# Patient Record
Sex: Male | Born: 1960 | Race: Black or African American | Hispanic: No | Marital: Married | Smoking: Former smoker
Health system: Southern US, Community
[De-identification: ages and names within clinical notes are randomized; demographics above are authoritative.]

## PROBLEM LIST (undated history)

## (undated) DIAGNOSIS — N280 Ischemia and infarction of kidney: Secondary | ICD-10-CM

## (undated) DIAGNOSIS — J439 Emphysema, unspecified: Secondary | ICD-10-CM

## (undated) DIAGNOSIS — M1990 Unspecified osteoarthritis, unspecified site: Secondary | ICD-10-CM

## (undated) HISTORY — DX: Ischemia and infarction of kidney: N28.0

## (undated) HISTORY — DX: Unspecified osteoarthritis, unspecified site: M19.90

## (undated) HISTORY — DX: Emphysema, unspecified: J43.9

---

## 2002-01-05 ENCOUNTER — Encounter: Admission: RE | Admit: 2002-01-05 | Discharge: 2002-01-05 | Payer: Self-pay | Admitting: *Deleted

## 2008-06-29 ENCOUNTER — Emergency Department (HOSPITAL_COMMUNITY): Admission: EM | Admit: 2008-06-29 | Discharge: 2008-06-29 | Payer: Self-pay | Admitting: Emergency Medicine

## 2008-07-28 ENCOUNTER — Emergency Department (HOSPITAL_COMMUNITY): Admission: EM | Admit: 2008-07-28 | Discharge: 2008-07-28 | Payer: Self-pay | Admitting: Emergency Medicine

## 2013-01-12 ENCOUNTER — Ambulatory Visit (INDEPENDENT_AMBULATORY_CARE_PROVIDER_SITE_OTHER): Payer: BC Managed Care – PPO | Admitting: Family Medicine

## 2013-01-12 VITALS — BP 124/74 | HR 68 | Temp 98.0°F | Resp 18 | Ht 71.0 in | Wt 179.0 lb

## 2013-01-12 DIAGNOSIS — Z0000 Encounter for general adult medical examination without abnormal findings: Secondary | ICD-10-CM

## 2013-01-12 LAB — COMPREHENSIVE METABOLIC PANEL
ALT: 21 U/L (ref 0–53)
AST: 24 U/L (ref 0–37)
Albumin: 3.6 g/dL (ref 3.5–5.2)
Alkaline Phosphatase: 81 U/L (ref 39–117)
BUN: 13 mg/dL (ref 6–23)
CO2: 28 mEq/L (ref 19–32)
Calcium: 9.2 mg/dL (ref 8.4–10.5)
Chloride: 108 mEq/L (ref 96–112)
Creat: 1.15 mg/dL (ref 0.50–1.35)
Glucose, Bld: 81 mg/dL (ref 70–99)
Potassium: 4.7 mEq/L (ref 3.5–5.3)
Sodium: 142 mEq/L (ref 135–145)
Total Bilirubin: 0.4 mg/dL (ref 0.3–1.2)
Total Protein: 7.3 g/dL (ref 6.0–8.3)

## 2013-01-12 LAB — POCT CBC
Granulocyte percent: 58.8 %G (ref 37–80)
HCT, POC: 49.9 % (ref 43.5–53.7)
Hemoglobin: 16.3 g/dL (ref 14.1–18.1)
Lymph, poc: 2.8 (ref 0.6–3.4)
MCH, POC: 29.1 pg (ref 27–31.2)
MCHC: 32.7 g/dL (ref 31.8–35.4)
MCV: 88.9 fL (ref 80–97)
MID (cbc): 0.7 (ref 0–0.9)
MPV: 9.1 fL (ref 0–99.8)
POC Granulocyte: 4.9 (ref 2–6.9)
POC LYMPH PERCENT: 33.4 %L (ref 10–50)
POC MID %: 7.8 %M (ref 0–12)
Platelet Count, POC: 160 10*3/uL (ref 142–424)
RBC: 5.61 M/uL (ref 4.69–6.13)
RDW, POC: 16 %
WBC: 8.4 10*3/uL (ref 4.6–10.2)

## 2013-01-12 LAB — POCT URINALYSIS DIPSTICK
Bilirubin, UA: NEGATIVE
Glucose, UA: NEGATIVE
Ketones, UA: NEGATIVE
Nitrite, UA: NEGATIVE
Protein, UA: NEGATIVE
Spec Grav, UA: 1.015
Urobilinogen, UA: 0.2
pH, UA: 5.5

## 2013-01-12 LAB — POCT UA - MICROSCOPIC ONLY
Bacteria, U Microscopic: NEGATIVE
Casts, Ur, LPF, POC: NEGATIVE
Crystals, Ur, HPF, POC: NEGATIVE
Mucus, UA: NEGATIVE
Yeast, UA: NEGATIVE

## 2013-01-12 LAB — IFOBT (OCCULT BLOOD): IFOBT: NEGATIVE

## 2013-01-12 LAB — POCT GLYCOSYLATED HEMOGLOBIN (HGB A1C): Hemoglobin A1C: 5.7

## 2013-01-12 NOTE — Progress Notes (Signed)
[address]   [place], [place]  [address]   [phone]  Subjective:    Patient ID: Cody Joyce, male    DOB: Mar 25, 1961, 52 y.o.   MRN: [id number]  HPI This 52 y.o. male presents for CPE.  PCP:  None  Last physical 2008. Tetanus vaccine 2006. Influenza vaccine never. S/p Hepatitis B vaccine.   Not sure about Hepatitis A; previous Hepatitis A? Colonoscopy never. Eye exam no; no glasses or contacts.   Dental exam 2007.   Review of Systems  Constitutional: Negative for fever, chills, diaphoresis, activity change, appetite change, fatigue and unexpected weight change.  HENT: Negative for hearing loss, ear pain, nosebleeds, congestion, sore throat, facial swelling, rhinorrhea, sneezing, drooling, mouth sores, trouble swallowing, neck pain, neck stiffness, dental problem, voice change, postnasal drip, sinus pressure, tinnitus and ear discharge.   Eyes: Negative for photophobia, pain, discharge, redness, itching and visual disturbance.  Respiratory: Negative for apnea, cough, choking, chest tightness, shortness of breath, wheezing and stridor.   Cardiovascular: Negative for chest pain, palpitations and leg swelling.  Gastrointestinal: Negative for nausea, vomiting, abdominal pain, diarrhea, constipation, blood in stool, abdominal distention, anal bleeding and rectal pain.  Endocrine: Negative for cold intolerance, heat intolerance, polydipsia, polyphagia and polyuria.  Genitourinary: Negative for dysuria, urgency, frequency, hematuria, flank pain, decreased urine volume, discharge, penile swelling, scrotal swelling, difficulty urinating, genital sores, penile pain and testicular pain.  Musculoskeletal: Negative for myalgias, back pain, joint swelling, arthralgias and gait problem.  Skin: Negative for color change, pallor, rash and wound.  Allergic/Immunologic: Negative for environmental allergies, food allergies and immunocompromised state.  Neurological: Negative for dizziness,  tremors, seizures, syncope, facial asymmetry, speech difficulty, weakness, light-headedness, numbness and headaches.  Hematological: Negative for adenopathy. Does not bruise/bleed easily.  Psychiatric/Behavioral: Negative for suicidal ideas, hallucinations, behavioral problems, confusion, sleep disturbance, self-injury, dysphoric mood, decreased concentration and agitation. The patient is not nervous/anxious and is not hyperactive.     History reviewed. No pertinent past medical history.  History reviewed. No pertinent past surgical history.  Prior to Admission medications   Not on File    No Known Allergies  History   Social History  . Marital Status: Married    Spouse Name: N/A    Number of Children: N/A  . Years of Education: N/A   Occupational History  . Not on file.   Social History Main Topics  . Smoking status: Light Tobacco Smoker  . Smokeless tobacco: Not on file  . Alcohol Use: 3.6 oz/week    2 Glasses of wine, 4 Cans of beer per week  . Drug Use: No  . Sexually Active: Yes   Other Topics Concern  . Not on file   Social History Narrative   Marital status:  Married x 3 years; not happily married.      Children:  None      Lives: alone.      Employment:  Medical sales representative Custodian work x 7 years; happy.      Tobacco: smoke cigars 2 per day.  Quit cigarettes in 2001; smoked x 15 years.      Alcohol:  Weekends 4 beers per week; 2 drinks per week.      Drugs:  None      Exercising:  Three days per week; active job.        Seatbelt:  100%      Guns:  None      Sexual activity: condoms; Gonorrhea in high school.  One  partner in past year.  Last STD screening 2013.    Family History  Problem Relation Age of Onset  . Glaucoma Mother   . Heart disease Father     stents/CAD/AMI age 10s.  . Glaucoma Father        Objective:   Physical Exam  Nursing note and vitals reviewed. Constitutional: He is oriented to person, place, and time. He appears well-developed  and well-nourished. No distress.  HENT:  Head: Normocephalic and atraumatic.  Right Ear: External ear normal.  Left Ear: External ear normal.  Nose: Nose normal.  Mouth/Throat: Oropharynx is clear and moist.  Eyes: Conjunctivae and EOM are normal. Pupils are equal, round, and reactive to light.  Neck: Normal range of motion. Neck supple. No JVD present. No thyromegaly present.  Cardiovascular: Normal rate, regular rhythm, normal heart sounds and intact distal pulses.  Exam reveals no gallop and no friction rub.   No murmur heard. Pulmonary/Chest: Effort normal and breath sounds normal. No respiratory distress. He has no wheezes. He has no rales.  Abdominal: Soft. Bowel sounds are normal. He exhibits no distension and no mass. There is no tenderness. There is no rebound and no guarding. Hernia confirmed negative in the right inguinal area and confirmed negative in the left inguinal area.  Genitourinary: Prostate normal and penis normal. Rectal exam shows external hemorrhoid. Prostate is not enlarged and not tender. Right testis shows no mass, no swelling and no tenderness. Left testis shows no mass, no swelling and no tenderness.  Musculoskeletal:       Right shoulder: Normal.       Left shoulder: Normal.       Cervical back: Normal.  Lymphadenopathy:    He has no cervical adenopathy.       Right: No inguinal adenopathy present.       Left: No inguinal adenopathy present.  Neurological: He is alert and oriented to person, place, and time. He has normal reflexes. No cranial nerve deficit. He exhibits normal muscle tone. Coordination normal.  Skin: Skin is warm and dry. No rash noted. He is not diaphoretic.   EKG:  Sinus bradycardia at 49.  Results for orders placed in visit on 01/12/13  POCT CBC      Result Value Range   WBC 8.4  4.6 - 10.2 K/uL   Lymph, poc 2.8  0.6 - 3.4   POC LYMPH PERCENT 33.4  10 - 50 %L   MID (cbc) 0.7  0 - 0.9   POC MID % 7.8  0 - 12 %M   POC Granulocyte 4.9   2 - 6.9   Granulocyte percent 58.8  37 - 80 %G   RBC 5.61  4.69 - 6.13 M/uL   Hemoglobin 16.3  14.1 - 18.1 g/dL   HCT, POC 13.0  86.5 - 53.7 %   MCV 88.9  80 - 97 fL   MCH, POC 29.1  27 - 31.2 pg   MCHC 32.7  31.8 - 35.4 g/dL   RDW, POC 78.4     Platelet Count, POC 160  142 - 424 K/uL   MPV 9.1  0 - 99.8 fL  IFOBT (OCCULT BLOOD)      Result Value Range   IFOBT Negative    POCT GLYCOSYLATED HEMOGLOBIN (HGB A1C)      Result Value Range   Hemoglobin A1C 5.7    POCT UA - MICROSCOPIC ONLY      Result Value Range   WBC, Ur, HPF,  POC 0-3     RBC, urine, microscopic 0-1     Bacteria, U Microscopic neg     Mucus, UA neg     Epithelial cells, urine per micros 0-1     Crystals, Ur, HPF, POC neg     Casts, Ur, LPF, POC neg     Yeast, UA neg    POCT URINALYSIS DIPSTICK      Result Value Range   Color, UA yellow     Clarity, UA clear     Glucose, UA neg     Bilirubin, UA neg     Ketones, UA neg     Spec Grav, UA 1.015     Blood, UA trace-intact     pH, UA 5.5     Protein, UA neg     Urobilinogen, UA 0.2     Nitrite, UA neg     Leukocytes, UA Trace         Assessment & Plan:  Routine general medical examination at a health care facility - Plan: POCT CBC, IFOBT POC (occult bld, rslt in office), POCT glycosylated hemoglobin (Hb A1C), POCT UA - Microscopic Only, POCT urinalysis dipstick, PSA, TSH, Comprehensive metabolic panel, EKG 12-Lead   1. CPE:  Anticipatory guidance --- ASA 81mg  daily; continue exercise and weight maintenance.  Immunizations UTD.  No previous colonoscopy and counseled on colon cancer screening ;hemosure obtained.  Counseled on prostate cancer screening; PSA obtained.  Obtain labs.

## 2013-01-12 NOTE — Patient Instructions (Addendum)
1.  Start aspirin 81mg  one daily for heart attack and stroke prevention. 2.  Your weight is normal for your height. 3.  Recommend colonoscopy for colon cancer screening.

## 2013-01-13 LAB — TSH: TSH: 1.148 u[IU]/mL (ref 0.350–4.500)

## 2013-01-13 LAB — PSA: PSA: 1.92 ng/mL (ref <=4.00)

## 2013-05-30 ENCOUNTER — Encounter: Payer: Self-pay | Admitting: Family Medicine

## 2013-05-30 ENCOUNTER — Ambulatory Visit (INDEPENDENT_AMBULATORY_CARE_PROVIDER_SITE_OTHER)
Admission: RE | Admit: 2013-05-30 | Discharge: 2013-05-30 | Disposition: A | Discharge status: Home / Self Care | Payer: BC Managed Care – PPO | Source: Ambulatory Visit | Attending: Family Medicine | Admitting: Family Medicine

## 2013-05-30 ENCOUNTER — Ambulatory Visit (INDEPENDENT_AMBULATORY_CARE_PROVIDER_SITE_OTHER): Payer: BC Managed Care – PPO | Admitting: Family Medicine

## 2013-05-30 VITALS — BP 152/88 | HR 49 | Wt 179.0 lb

## 2013-05-30 DIAGNOSIS — M25569 Pain in unspecified knee: Secondary | ICD-10-CM

## 2013-05-30 DIAGNOSIS — M1712 Unilateral primary osteoarthritis, left knee: Secondary | ICD-10-CM

## 2013-05-30 DIAGNOSIS — Q799 Congenital malformation of musculoskeletal system, unspecified: Secondary | ICD-10-CM

## 2013-05-30 DIAGNOSIS — S76112A Strain of left quadriceps muscle, fascia and tendon, initial encounter: Secondary | ICD-10-CM

## 2013-05-30 DIAGNOSIS — IMO0002 Reserved for concepts with insufficient information to code with codable children: Secondary | ICD-10-CM

## 2013-05-30 DIAGNOSIS — M898X9 Other specified disorders of bone, unspecified site: Secondary | ICD-10-CM | POA: Insufficient documentation

## 2013-05-30 DIAGNOSIS — M25562 Pain in left knee: Secondary | ICD-10-CM | POA: Insufficient documentation

## 2013-05-30 DIAGNOSIS — S76119A Strain of unspecified quadriceps muscle, fascia and tendon, initial encounter: Secondary | ICD-10-CM | POA: Insufficient documentation

## 2013-05-30 DIAGNOSIS — M1710 Unilateral primary osteoarthritis, unspecified knee: Secondary | ICD-10-CM

## 2013-05-30 HISTORY — DX: Unilateral primary osteoarthritis, left knee: M17.12

## 2013-05-30 HISTORY — DX: Other specified disorders of bone, unspecified site: M89.8X9

## 2013-05-30 MED ORDER — MELOXICAM 15 MG PO TABS: 15.0000 mg | ORAL_TABLET | Freq: Every day | ORAL | Status: DC

## 2013-05-30 NOTE — Patient Instructions (Addendum)
You have a quad tear and a avulsion off the knee cap Exercises daily starting 4 days from now and then daily Meloxicam daily for 10 days then as needed.  Avoid heat.  Wear brace with activity We will get xray just to make sure we are not missing anything.  Come back and see me in 2 weeks.

## 2013-05-30 NOTE — Progress Notes (Signed)
CC: Left knee pain  HPI: Patient is a 52 year old gentleman coming in with left knee pain for only duration. Patient remembers while he was at work he did feel a pop and did have significant pain immediately on the lateral aspect of his left knee. Patient did have swelling which has seemed to improve. Unfortunately patient states that he feels that he still has weakness in the knee ever he bends the knee he has significant pain. Patient denies any radiation of pain but states that the pain is a sharp sensation and does have a dull ache at all times it has wakened him up at night. Patient is never injured this knee previously. Patient has not changed any of his daily activities otherwise. 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 Patient reports the pain at a 9/10. Patient has tried some of his girlfriend's anti-inflammatories which has been helpful.  Past medical, surgical, family and social history reviewed. Medications reviewed all in the electronic medical record.  Review of Systems: No headache, visual changes, nausea, vomiting, diarrhea, constipation, dizziness, abdominal pain, skin rash, fevers, chills, night sweats, weight loss, swollen lymph nodes, body aches, joint swelling, muscle aches, chest pain, shortness of breath, mood changes.   Objective:    Blood pressure 152/88, pulse 49, weight 179 lb (81.194 kg), SpO2 97.00%.   General: No apparent distress alert and oriented x3 mood and affect normal, dressed appropriately.  HEENT: Pupils equal, extraocular movements intact Respiratory: Patient's speak in full sentences and does not appear short of breath Cardiovascular: No lower extremity edema, non tender, no erythema Skin: Warm dry intact with no signs of infection or rash on extremities or on axial skeleton. Abdomen: Soft nontender Neuro: Cranial nerves II through XII are intact, neurovascularly intact in all extremities with 2+ DTRs and 2+  pulses. Lymph: No lymphadenopathy of posterior or anterior cervical chain or axillae bilaterally.  Gait normal with good balance and coordination.  MSK: Non tender with full range of motion and good stability and symmetric strength and tone of shoulders, elbows, wrist, hip,  and ankles bilaterally.  Knee: Normal to inspection with no erythema or effusion or obvious bony abnormalities. Patient has extreme atrophy of the quadriceps bilaterally. Palpation normal with no warmth,  patient is severely tender in the suprapatellar pouch area. Patient is also tender over the lateral superior portion of the patella.  ROM full in extension but lacks last 15 of flexion secondary to pain but has full lower leg rotation. Ligaments with solid consistent endpoints including ACL, PCL, LCL, MCL. Negative Mcmurray's, Apley's, and Thessalonian tests. Positive painful patellar compression. Patellar glide with crepitus. Both the patella and quadriceps tendons are remarkably small the patella is in good alignment. Hamstring strength normal Quadricep strength on the left side is 3/5 compared to 5 out of 5 on the contralateral side. He is neurovascularly intact distally.  X-rays were ordered reviewed and interpreted by me today. Patient does have moderate to advanced osteoarthritic changes for his age in all 3 compartments. Patient also has what appears to be a 2.5 cm area that is likely a resolving hematoma with calcific changes. Patient also has what appears to be a superior lateral avulsion off of the patella. Overread pending.  MSK US performed of: Left This study was ordered, performed, and interpreted by Terrilee Files D.O.  Knee: All structures visualized. Anteromedial, anterolateral, posteromedial, and posterolateral menisci unremarkable without tearing, fraying, effusion, or displacement. Patellar Tendon  unremarkable on long and transverse views without effusion. No abnormality of prepatellar bursa. LCL and  MCL unremarkable on long and transverse views. No abnormality of origin of medial or lateral head of the gastrocnemius. Patient though does have significant swelling of the suprapatellar pouch and does have area of what appears to be a resolving hematoma. There is neovascularization in this area. There is hypoechoic changes surrounding the area. This is not attached to the soft tissue is not a soft tissue mass. Patient's patella to on the lateral aspect shows an avulsion. There is hypoechoic changes in this area as well. Patient also has a 2.1 cm tear in the vastus lateralis was at the muscular tendon junction.  IMPRESSION: Resolving hematoma of the suprapatellar pouch as well as a vastus lateralis quad tear causing avulsion off of the lateral superior patella.   Impression and Recommendations:     This case required medical decision making of moderate complexity.

## 2013-06-13 ENCOUNTER — Ambulatory Visit (INDEPENDENT_AMBULATORY_CARE_PROVIDER_SITE_OTHER): Payer: BC Managed Care – PPO | Admitting: Family Medicine

## 2013-06-13 ENCOUNTER — Encounter: Payer: Self-pay | Admitting: Family Medicine

## 2013-06-13 VITALS — BP 152/84 | HR 53 | Wt 180.0 lb

## 2013-06-13 DIAGNOSIS — M1712 Unilateral primary osteoarthritis, left knee: Secondary | ICD-10-CM

## 2013-06-13 DIAGNOSIS — IMO0002 Reserved for concepts with insufficient information to code with codable children: Secondary | ICD-10-CM

## 2013-06-13 DIAGNOSIS — M1710 Unilateral primary osteoarthritis, unspecified knee: Secondary | ICD-10-CM

## 2013-06-13 DIAGNOSIS — S76112D Strain of left quadriceps muscle, fascia and tendon, subsequent encounter: Secondary | ICD-10-CM

## 2013-06-13 DIAGNOSIS — Z5189 Encounter for other specified aftercare: Secondary | ICD-10-CM

## 2013-06-13 NOTE — Assessment & Plan Note (Signed)
Patient has been doing significantly better. Will continue to brace with activity as well as icing protocol after activity Continue the exercises 3-4 times a week Discussed that if any worsening pain to come back immediately.

## 2013-06-13 NOTE — Assessment & Plan Note (Signed)
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 Discussed over-the-counter treatments that could be beneficial. Discussed the patient does have a free body that could be floating in cause mechanical symptoms. Discussed that the only curative treatment for this would be surgical. Patient would like to avoid that at this time.

## 2013-06-13 NOTE — Progress Notes (Signed)
  Subjective:    CC: Left quadricep tear  HPI: Patient is a 52 year old gentleman coming in 2 weeks after being diagnosed with a vastus lateralis tear including some heterotropic ossification of the quadricep itself. There appeared to be some mild hematoma that was in the subcutaneous tissue as well as a small avulsion fracture of the superior lateral aspect of the patella. Patient was given a brace medications as well as an icing program. Patient was also given a home exercise program. Patient states  Past medical history, Surgical history, Family history not pertinant except as noted below, Social history, Allergies, and medications have been entered into the medical record, reviewed, and no changes needed.   Review of Systems: No fevers, chills, night sweats, weight loss, chest pain, or shortness of breath.   Objective:   Blood pressure 152/84, pulse 53, weight 180 lb (81.647 kg), SpO2 97.00%.  General: Well Developed, well nourished, and in no acute distress.  Neuro: Alert and oriented x3, extra-ocular muscles intact, sensation grossly intact.  HEENT: Normocephalic, atraumatic, pupils equal round reactive to light, neck supple, no masses, no lymphadenopathy, thyroid nonpalpable.  Skin: Warm and dry, no rashes. Cardiac:  no lower extremity edema. Respiratory: Not using accessory muscles, speaking in full sentences. Abdominal: NT, soft Gait: Nonantlagic, good balance and coordination Lymphatic: no lymphadenopathy in neck or axillae on palpation, non tender.  Musculoskeletal: Inspection and palpation of the right and left upper extremities including the shoulders elbows and wrist are unremarkable with full range of motion and good muscle strength and tone. Inspection and palpation of the right and left lower extremities including the hips and ankles are unremarkable and nontender with full range of motion and good muscle strength and tone and are symmetric.  Patient did have x-rays at last  visit that did show he did have early tricompartment degenerative changes of the left knee and a 2.5 cm os of the structure in the suprapatellar pouch. These were reviewed by me today.  Knee: Left Normal to inspection with no erythema or effusion or obvious bony abnormalities. Patient still has significant atrophy of the quadricep bilaterally Palpation normal with no warmth, joint line tenderness, patellar tenderness, or condyle tenderness. ROM full in flexion and extension and lower leg rotation. Ligaments with solid consistent endpoints including ACL, PCL, LCL, MCL. Negative Mcmurray's, Apley's, and Thessalonian tests. Still painful patellar compression. Patellar glide with significant crepitus. Patellar and quadriceps tendons unremarkable. Hamstring and quadriceps strength is normal. Patient strength of the quadricep is significantly better.  Muscular skeletal ultrasound was performed and interpreted by me today. Limited ultrasound showed that patient's care and the vastus lateralis as healed by approximately 80% at this time. Patient does have hypertrophic changes in the area representing formation of scar tissue. Good Doppler flow. Patient's free-floating segments is found and the lateral patellar pouch. This does not seem to be moving. Interestingly enough it does seem to compress.   Impression and Recommendations:

## 2013-06-13 NOTE — Patient Instructions (Signed)
You are doing well Keep the brace on then.  Keep up the exercises at least most days of the week Ice after activity You do have fairly bad arthrits so if it gets worse then come back, I have some tricks.  You do have a loose body in the about 2 cm long but seems to be out of the way from now.  If it gets stuck come back.   Take tylenol 650 mg three times a day is the best evidence based medicine we have for arthritis.  Aleve 1-2 tabs twice a day with food or ibuprofen 600mg  twice daily with food can be added to tylenol.  Glucosamine sulfate 750mg  twice a day is a supplement that has been shown to help moderate to severe arthritis. Capsaicin topically up to four times a day may also help with pain. Cortisone injections are an option if these interventions do not seem to make a difference or need more relief.  If cortisone injections do not help, there are different types of shots that may help but they take longer to take effect.  We can discuss this at follow up.  It's important that you continue to stay active. Come back and see me in 4-6 weeks.

## 2013-07-25 ENCOUNTER — Ambulatory Visit: Payer: BC Managed Care – PPO | Admitting: Family Medicine

## 2013-07-25 DIAGNOSIS — Z0289 Encounter for other administrative examinations: Secondary | ICD-10-CM

## 2015-07-03 ENCOUNTER — Ambulatory Visit (INDEPENDENT_AMBULATORY_CARE_PROVIDER_SITE_OTHER): Payer: Self-pay | Admitting: Emergency Medicine

## 2015-07-03 ENCOUNTER — Ambulatory Visit (INDEPENDENT_AMBULATORY_CARE_PROVIDER_SITE_OTHER): Payer: Self-pay

## 2015-07-03 VITALS — BP 130/80 | HR 56 | Temp 98.4°F | Resp 16 | Ht 71.0 in | Wt 177.0 lb

## 2015-07-03 DIAGNOSIS — M25532 Pain in left wrist: Secondary | ICD-10-CM

## 2015-07-03 DIAGNOSIS — M654 Radial styloid tenosynovitis [de Quervain]: Secondary | ICD-10-CM

## 2015-07-03 MED ORDER — NAPROXEN SODIUM 550 MG PO TABS: 550.0000 mg | ORAL_TABLET | Freq: Two times a day (BID) | ORAL | Status: AC

## 2015-07-03 NOTE — Patient Instructions (Signed)
De Quervain Tenosynovitis °Tendons attach muscles to bones. They also help with joint movements. When tendons become irritated or swollen, it is called tendinitis. °The extensor pollicis brevis (EPB) tendon connects the EPB muscle to a bone that is near the base of the thumb. The EPB muscle helps to straighten and extend the thumb. De Quervain tenosynovitis is a condition in which the EPB tendon lining (sheath) becomes irritated, thickened, and swollen. This condition is sometimes called stenosing tenosynovitis. This condition causes pain on the thumb side of the back of the wrist. °CAUSES °Causes of this condition include: °· Activities that repeatedly cause your thumb and wrist to extend. °· A sudden increase in activity or change in activity that affects your wrist. °RISK FACTORS: °This condition is more likely to develop in: °· Females. °· People who have diabetes. °· Women who have recently given birth. °· People who are over 40 years of age. °· People who do activities that involve repeated hand and wrist motions, such as tennis, racquetball, volleyball, gardening, and taking care of children. °· People who do heavy labor. °· People who have poor wrist strength and flexibility. °· People who do not warm up properly before activities. °SYMPTOMS °Symptoms of this condition include: °· Pain or tenderness over the thumb side of the back of the wrist when your thumb and wrist are not moving. °· Pain that gets worse when you straighten your thumb or extend your thumb or wrist. °· Pain when the injured area is touched. °· Locking or catching of the thumb joint while you bend and straighten your thumb. °· Decreased thumb motion due to pain. °· Swelling over the affected area. °DIAGNOSIS °This condition is diagnosed with a medical history and physical exam. Your health care provider will ask for details about your injury and ask about your symptoms. °TREATMENT °Treatment may include the use of icing and medicines to  reduce pain and swelling. You may also be advised to wear a splint or brace to limit your thumb and wrist motion. In less severe cases, treatment may also include working with a physical therapist to strengthen your wrist and calm the irritation around your EPB tendon sheath. In severe cases, surgery may be needed. °HOME CARE INSTRUCTIONS °If You Have a Splint or Brace: °· Wear it as told by your health care provider. Remove it only as told by your health care provider. °· Loosen the splint or brace if your fingers become numb and tingle, or if they turn cold and blue. °· Keep the splint or brace clean and dry. °Managing Pain, Stiffness, and Swelling  °· If directed, apply ice to the injured area. °¨ Put ice in a plastic bag. °¨ Place a towel between your skin and the bag. °¨ Leave the ice on for 20 minutes, 2-3 times per day. °· Move your fingers often to avoid stiffness and to lessen swelling. °· Raise (elevate) the injured area above the level of your heart while you are sitting or lying down. °General Instructions °· Return to your normal activities as told by your health care provider. Ask your health care provider what activities are safe for you. °· Take over-the-counter and prescription medicines only as told by your health care provider. °· Keep all follow-up visits as told by your health care provider. This is important. °· Do not drive or operate heavy machinery while taking prescription pain medicine. °SEEK MEDICAL CARE IF: °· Your pain, tenderness, or swelling gets worse, even if you have had   treatment. °· You have numbness or tingling in your wrist, hand, or fingers on the injured side. °  °This information is not intended to replace advice given to you by your health care provider. Make sure you discuss any questions you have with your health care provider. °  °Document Released: 08/23/2005 Document Revised: 05/14/2015 Document Reviewed: 10/29/2014 °[employer] Interactive Patient Education ©2016  [employer] ° °

## 2015-07-03 NOTE — Progress Notes (Signed)
Subjective:  Patient ID: Cody Joyce, male    DOB: 1960/10/12  Age: 54 y.o. MRN: [id number]  CC: Hand Injury   HPI Cody Joyce presents  with pain in the left thumb. He's been injured by his dog approximately a month ago is been having pain since. He's been intermittently splinting is some with little improvement. Because he is splinting is been so intermittent and sporadic. He has no ecchymosis or swelling mostly has pain with lifting and pain with movement of his thumb. He's been taking no medication  History Cody Joyce has no past medical history on file.   He has no past surgical history on file.   His  family history includes Glaucoma in his father and mother; Heart disease in his father.  He   reports that he has been smoking.  He has never used smokeless tobacco. He reports that he drinks about 3.6 oz of alcohol per week. He reports that he does not use illicit drugs.  Outpatient Prescriptions Prior to Visit  Medication Sig Dispense Refill  . meloxicam (MOBIC) 15 MG tablet Take 1 tablet (15 mg total) by mouth daily. (Patient not taking: Reported on 07/03/2015) 30 tablet 0   No facility-administered medications prior to visit.    Social History   Social History  . Marital Status: Married    Spouse Name: N/A  . Number of Children: N/A  . Years of Education: N/A   Social History Main Topics  . Smoking status: Light Tobacco Smoker  . Smokeless tobacco: Never Used  . Alcohol Use: 3.6 oz/week    2 Glasses of wine, 4 Cans of beer per week  . Drug Use: No  . Sexual Activity: Yes   Other Topics Concern  . None   Social History Narrative   Marital status:  Married x 3 years; not happily married.      Children:  None      Lives: alone.      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      Tobacco: smoke cigars 2 per day.  Quit cigarettes in 2001; smoked x 15 years.      Alcohol:  Weekends 4 beers per week; 2 drinks per week.      Drugs:  None     Exercising:  Three days per week; active job.        Seatbelt:  100%      Guns:  None      Sexual activity: condoms; Gonorrhea in high school.  One partner in past year.  Last STD screening 2013.     Review of Systems  Constitutional: Negative for fever, chills and appetite change.  HENT: Negative for congestion, ear pain, postnasal drip, sinus pressure and sore throat.   Eyes: Negative for pain and redness.  Respiratory: Negative for cough, shortness of breath and wheezing.   Cardiovascular: Negative for leg swelling.  Gastrointestinal: Negative for nausea, vomiting, abdominal pain, diarrhea, constipation and blood in stool.  Endocrine: Negative for polyuria.  Genitourinary: Negative for dysuria, urgency, frequency and flank pain.  Musculoskeletal: Negative for gait problem.  Skin: Negative for rash.  Neurological: Negative for weakness and headaches.  Psychiatric/Behavioral: Negative for confusion and decreased concentration. The patient is not nervous/anxious.     Objective:  BP 130/80 mmHg  Pulse 56  Temp(Src) 98.4 F (36.9 C) (Oral)  Resp 16  Ht 5\' 11"  (1.803 m)  Wt 177 lb (80.287 kg)  BMI 24.70 kg/m2  SpO2 97%  Physical Exam  Constitutional: He is oriented to person, place, and time. He appears well-developed and well-nourished. No distress.  HENT:  Head: Normocephalic and atraumatic.  Right Ear: External ear normal.  Left Ear: External ear normal.  Nose: Nose normal.  Eyes: Conjunctivae and EOM are normal. Pupils are equal, round, and reactive to light. No scleral icterus.  Neck: Normal range of motion. Neck supple. No tracheal deviation present.  Cardiovascular: Normal rate, regular rhythm and normal heart sounds.   Pulmonary/Chest: Effort normal. No respiratory distress. He has no wheezes. He has no rales.  Abdominal: He exhibits no mass. There is no tenderness. There is no rebound and no guarding.  Musculoskeletal: He exhibits no edema.  Lymphadenopathy:     He has no cervical adenopathy.  Neurological: He is alert and oriented to person, place, and time. Coordination normal.  Skin: Skin is warm and dry. No rash noted.  Psychiatric: He has a normal mood and affect. His behavior is normal.   He guards it is tender in his base of his thumb and the provocative test [name] is positive he has no ecchymosis or deformity of thumb   Assessment & Plan:   Cody Joyce was seen today for hand injury.  Diagnoses and all orders for this visit:  Left wrist pain -     DG Wrist Complete Left; Future  De Quervain's tenosynovitis, left  Other orders -     naproxen sodium (ANAPROX DS) 550 MG tablet; Take 1 tablet (550 mg total) by mouth 2 (two) times daily with a meal.  I have discontinued Mr. Cody Joyce meloxicam. I am also having him start on naproxen sodium.  Meds ordered this encounter  Medications  . naproxen sodium (ANAPROX DS) 550 MG tablet    Sig: Take 1 tablet (550 mg total) by mouth 2 (two) times daily with a meal.    Dispense:  40 tablet    Refill:  0    Appropriate red flag conditions were discussed with the patient as well as actions that should be taken.  Patient expressed his understanding.  Follow-up: Return in about 1 week (around 07/10/2015).  Roselee Culver, MD   [facility] reading (PRIMARY) by  Dr. Ouida Sills.  Negative for fracture.

## 2017-11-08 ENCOUNTER — Emergency Department (HOSPITAL_COMMUNITY)
Admission: EM | Admit: 2017-11-08 | Discharge: 2017-11-08 | Discharge status: Absent without leave | Payer: No Typology Code available for payment source

## 2017-11-08 ENCOUNTER — Other Ambulatory Visit: Payer: Self-pay

## 2018-07-25 ENCOUNTER — Other Ambulatory Visit: Payer: Self-pay

## 2018-07-25 ENCOUNTER — Emergency Department (HOSPITAL_COMMUNITY): Payer: Self-pay

## 2018-07-25 ENCOUNTER — Observation Stay (HOSPITAL_COMMUNITY)
Admission: EM | Admit: 2018-07-25 | Discharge: 2018-07-26 | Disposition: A | Discharge status: Home / Self Care | Payer: Self-pay | Attending: Internal Medicine | Admitting: Internal Medicine

## 2018-07-25 ENCOUNTER — Encounter (HOSPITAL_COMMUNITY): Payer: Self-pay | Admitting: *Deleted

## 2018-07-25 DIAGNOSIS — Z8249 Family history of ischemic heart disease and other diseases of the circulatory system: Secondary | ICD-10-CM | POA: Insufficient documentation

## 2018-07-25 DIAGNOSIS — R918 Other nonspecific abnormal finding of lung field: Secondary | ICD-10-CM

## 2018-07-25 DIAGNOSIS — R590 Localized enlarged lymph nodes: Secondary | ICD-10-CM | POA: Insufficient documentation

## 2018-07-25 DIAGNOSIS — Z79899 Other long term (current) drug therapy: Secondary | ICD-10-CM | POA: Insufficient documentation

## 2018-07-25 DIAGNOSIS — D696 Thrombocytopenia, unspecified: Secondary | ICD-10-CM | POA: Insufficient documentation

## 2018-07-25 DIAGNOSIS — F1020 Alcohol dependence, uncomplicated: Secondary | ICD-10-CM

## 2018-07-25 DIAGNOSIS — F17210 Nicotine dependence, cigarettes, uncomplicated: Secondary | ICD-10-CM | POA: Insufficient documentation

## 2018-07-25 DIAGNOSIS — J449 Chronic obstructive pulmonary disease, unspecified: Secondary | ICD-10-CM

## 2018-07-25 DIAGNOSIS — R0602 Shortness of breath: Secondary | ICD-10-CM | POA: Insufficient documentation

## 2018-07-25 DIAGNOSIS — I700 Atherosclerosis of aorta: Secondary | ICD-10-CM | POA: Insufficient documentation

## 2018-07-25 DIAGNOSIS — J439 Emphysema, unspecified: Secondary | ICD-10-CM | POA: Insufficient documentation

## 2018-07-25 DIAGNOSIS — R001 Bradycardia, unspecified: Secondary | ICD-10-CM | POA: Insufficient documentation

## 2018-07-25 DIAGNOSIS — R079 Chest pain, unspecified: Secondary | ICD-10-CM | POA: Diagnosis present

## 2018-07-25 DIAGNOSIS — E8809 Other disorders of plasma-protein metabolism, not elsewhere classified: Secondary | ICD-10-CM

## 2018-07-25 HISTORY — DX: Other nonspecific abnormal finding of lung field: R91.8

## 2018-07-25 HISTORY — DX: Chronic obstructive pulmonary disease, unspecified: J44.9

## 2018-07-25 HISTORY — DX: Alcohol dependence, uncomplicated: F10.20

## 2018-07-25 HISTORY — DX: Localized enlarged lymph nodes: R59.0

## 2018-07-25 LAB — HEPATIC FUNCTION PANEL
ALT: 20 U/L (ref 0–44)
AST: 22 U/L (ref 15–41)
Albumin: 3.3 g/dL — ABNORMAL LOW (ref 3.5–5.0)
Alkaline Phosphatase: 83 U/L (ref 38–126)
BILIRUBIN DIRECT: 0.1 mg/dL (ref 0.0–0.2)
BILIRUBIN INDIRECT: 0.5 mg/dL (ref 0.3–0.9)
Total Bilirubin: 0.6 mg/dL (ref 0.3–1.2)
Total Protein: 7.1 g/dL (ref 6.5–8.1)

## 2018-07-25 LAB — BASIC METABOLIC PANEL
Anion gap: 6 (ref 5–15)
BUN: 16 mg/dL (ref 6–20)
CO2: 29 mmol/L (ref 22–32)
Calcium: 8.9 mg/dL (ref 8.9–10.3)
Chloride: 108 mmol/L (ref 98–111)
Creatinine, Ser: 1.06 mg/dL (ref 0.61–1.24)
GFR calc Af Amer: >60 mL/min (ref >60)
GFR calc non Af Amer: >60 mL/min (ref >60)
Glucose, Bld: 69 mg/dL — ABNORMAL LOW (ref 70–99)
Potassium: 4 mmol/L (ref 3.5–5.1)
Sodium: 143 mmol/L (ref 135–145)

## 2018-07-25 LAB — POCT I-STAT TROPONIN I
TROPONIN I, POC: 0 ng/mL (ref 0.00–0.08)
Troponin i, poc: 0.01 ng/mL (ref 0.00–0.08)

## 2018-07-25 LAB — CBC
HCT: 47.5 % (ref 39.0–52.0)
Hemoglobin: 15.9 g/dL (ref 13.0–17.0)
MCH: 28.3 pg (ref 26.0–34.0)
MCHC: 33.5 g/dL (ref 30.0–36.0)
MCV: 84.7 fL (ref 80.0–100.0)
Platelets: 125 10*3/uL — ABNORMAL LOW (ref 150–400)
RBC: 5.61 MIL/uL (ref 4.22–5.81)
RDW: 16.4 % — ABNORMAL HIGH (ref 11.5–15.5)
WBC: 8.3 10*3/uL (ref 4.0–10.5)
nRBC: 0 % (ref 0.0–0.2)

## 2018-07-25 LAB — GLUCOSE, CAPILLARY: Glucose-Capillary: 94 mg/dL (ref 70–99)

## 2018-07-25 LAB — D-DIMER, QUANTITATIVE: D-Dimer, Quant: 2.41 ug/mL-FEU — ABNORMAL HIGH (ref 0.00–0.50)

## 2018-07-25 MED ORDER — ASPIRIN 81 MG PO CHEW
324.0000 mg | CHEWABLE_TABLET | Freq: Once | ORAL | Status: AC
  Administered 2018-07-25: 324 mg via ORAL
  Filled 2018-07-25: qty 4

## 2018-07-25 MED ORDER — LORAZEPAM 2 MG/ML IJ SOLN: 1.0000 mg | Freq: Four times a day (QID) | INTRAMUSCULAR | Status: DC | PRN

## 2018-07-25 MED ORDER — THIAMINE HCL 100 MG/ML IJ SOLN: 100.0000 mg | Freq: Every day | INTRAMUSCULAR | Status: DC

## 2018-07-25 MED ORDER — VITAMIN B-1 100 MG PO TABS
100.0000 mg | ORAL_TABLET | Freq: Every day | ORAL | Status: DC
  Administered 2018-07-25 – 2018-07-26 (×2): 100 mg via ORAL
  Filled 2018-07-25 (×2): qty 1

## 2018-07-25 MED ORDER — MORPHINE SULFATE (PF) 2 MG/ML IV SOLN: 2.0000 mg | INTRAVENOUS | Status: DC | PRN

## 2018-07-25 MED ORDER — IOPAMIDOL (ISOVUE-370) INJECTION 76%
INTRAVENOUS | Status: AC
  Filled 2018-07-25: qty 100

## 2018-07-25 MED ORDER — IOPAMIDOL (ISOVUE-370) INJECTION 76%
100.0000 mL | Freq: Once | INTRAVENOUS | Status: AC | PRN
  Administered 2018-07-25: 100 mL via INTRAVENOUS

## 2018-07-25 MED ORDER — LORAZEPAM 1 MG PO TABS: 0.0000 mg | ORAL_TABLET | Freq: Two times a day (BID) | ORAL | Status: DC

## 2018-07-25 MED ORDER — LORAZEPAM 1 MG PO TABS: 1.0000 mg | ORAL_TABLET | Freq: Four times a day (QID) | ORAL | Status: DC | PRN

## 2018-07-25 MED ORDER — LORAZEPAM 1 MG PO TABS: 0.0000 mg | ORAL_TABLET | Freq: Four times a day (QID) | ORAL | Status: DC

## 2018-07-25 MED ORDER — SODIUM CHLORIDE (PF) 0.9 % IJ SOLN
INTRAMUSCULAR | Status: AC
  Filled 2018-07-25: qty 50

## 2018-07-25 MED ORDER — ONDANSETRON HCL 4 MG/2ML IJ SOLN: 4.0000 mg | Freq: Four times a day (QID) | INTRAMUSCULAR | Status: DC | PRN

## 2018-07-25 MED ORDER — NITROGLYCERIN 0.4 MG SL SUBL: 0.4000 mg | SUBLINGUAL_TABLET | SUBLINGUAL | Status: DC | PRN

## 2018-07-25 MED ORDER — FOLIC ACID 1 MG PO TABS
1.0000 mg | ORAL_TABLET | Freq: Every day | ORAL | Status: DC
  Administered 2018-07-25 – 2018-07-26 (×2): 1 mg via ORAL
  Filled 2018-07-25 (×2): qty 1

## 2018-07-25 MED ORDER — PANTOPRAZOLE SODIUM 40 MG PO TBEC
40.0000 mg | DELAYED_RELEASE_TABLET | Freq: Every day | ORAL | Status: DC
  Administered 2018-07-26: 40 mg via ORAL
  Filled 2018-07-25 (×2): qty 1

## 2018-07-25 MED ORDER — ASPIRIN EC 81 MG PO TBEC
81.0000 mg | DELAYED_RELEASE_TABLET | Freq: Every day | ORAL | Status: DC
  Administered 2018-07-26: 81 mg via ORAL
  Filled 2018-07-25: qty 1

## 2018-07-25 MED ORDER — ADULT MULTIVITAMIN W/MINERALS CH
1.0000 | ORAL_TABLET | Freq: Every day | ORAL | Status: DC
  Administered 2018-07-25 – 2018-07-26 (×2): 1 via ORAL
  Filled 2018-07-25 (×2): qty 1

## 2018-07-25 MED ORDER — ACETAMINOPHEN 325 MG PO TABS: 650.0000 mg | ORAL_TABLET | ORAL | Status: DC | PRN

## 2018-07-25 MED ORDER — ENOXAPARIN SODIUM 40 MG/0.4ML ~~LOC~~ SOLN: 40.0000 mg | SUBCUTANEOUS | Status: DC

## 2018-07-25 NOTE — ED Notes (Signed)
ED TO INPATIENT HANDOFF REPORT  Name/Age/Gender Cody Joyce 57 y.o. male  Code Status   Home/SNF/Other Home  Chief Complaint chest pain   Level of Care/Admitting Diagnosis ED Disposition    ED Disposition Condition [facility]: [place] [[id number]]  Level of Care: Telemetry [5]  I expect the patient will be discharged within 24 hours: Yes  LOW acuity---Tx typically complete <24 hrs---ACUTE conditions typically can be evaluated <24 hours---LABS likely to return to acceptable levels <24 hours---IS near functional baseline---EXPECTED to return to current living arrangement---NOT newly hypoxic: Meets criteria for 5C-Observation unit  Diagnosis: Chest pain [627035]  Admitting Physician: Vianne Bulls [[id number]]  Attending Physician: Vianne Bulls [[id number]]  PT Class (Do Not Modify): Observation [104]  PT Acc Code (Do Not Modify): Observation [10022]       Medical History History reviewed. No pertinent past medical history.  Allergies No Known Allergies  IV Location/Drains/Wounds Patient Lines/Drains/Airways Status   Active Line/Drains/Airways    Name:   Placement date:   Placement time:   Site:   Days:   Peripheral IV 07/25/18 Right Antecubital   07/25/18    1728    Antecubital   less than 1          Labs/Imaging Results for orders placed or performed during the hospital encounter of 07/25/18 (from the past 48 hour(s))  Basic metabolic panel     Status: Abnormal   Collection Time: 07/25/18  2:30 PM  Result Value Ref Range   Sodium 143 135 - 145 mmol/L   Potassium 4.0 3.5 - 5.1 mmol/L   Chloride 108 98 - 111 mmol/L   CO2 29 22 - 32 mmol/L   Glucose, Bld 69 (L) 70 - 99 mg/dL   BUN 16 6 - 20 mg/dL   Creatinine, Ser 1.06 0.61 - 1.24 mg/dL   Calcium 8.9 8.9 - 10.3 mg/dL   GFR calc non Af Amer >60 >60 mL/min   GFR calc Af Amer >60 >60 mL/min    Comment: (NOTE) The eGFR has been calculated using the CKD EPI equation. This  calculation has not been validated in all clinical situations. eGFR's persistently <60 mL/min signify possible Chronic Kidney Disease.    Anion gap 6 5 - 15    Comment: Performed at [facility], [place] [address] [place], [place] [address]  CBC     Status: Abnormal   Collection Time: 07/25/18  2:30 PM  Result Value Ref Range   WBC 8.3 4.0 - 10.5 K/uL   RBC 5.61 4.22 - 5.81 MIL/uL   Hemoglobin 15.9 13.0 - 17.0 g/dL   HCT 47.5 39.0 - 52.0 %   MCV 84.7 80.0 - 100.0 fL   MCH 28.3 26.0 - 34.0 pg   MCHC 33.5 30.0 - 36.0 g/dL   RDW 16.4 (H) 11.5 - 15.5 %   Platelets 125 (L) 150 - 400 K/uL   nRBC 0.0 0.0 - 0.2 %    Comment: Performed at [facility], [place] [address], [place], [place] [address]  Hepatic function panel     Status: Abnormal   Collection Time: 07/25/18  2:30 PM  Result Value Ref Range   Total Protein 7.1 6.5 - 8.1 g/dL   Albumin 3.3 (L) 3.5 - 5.0 g/dL   AST 22 15 - 41 U/L   ALT 20 0 - 44 U/L   Alkaline Phosphatase 83 38 - 126 U/L   Total  Bilirubin 0.6 0.3 - 1.2 mg/dL   Bilirubin, Direct 0.1 0.0 - 0.2 mg/dL   Indirect Bilirubin 0.5 0.3 - 0.9 mg/dL    Comment: Performed at [facility], [place] [address] [place], [place] [address]  D-dimer, quantitative (not at [facility])     Status: Abnormal   Collection Time: 07/25/18  2:30 PM  Result Value Ref Range   D-Dimer, Quant 2.41 (H) 0.00 - 0.50 ug/mL-FEU    Comment: (NOTE) At the manufacturer cut-off of 0.50 ug/mL FEU, this assay has been documented to exclude PE with a sensitivity and negative predictive value of 97 to 99%.  At this time, this assay has not been approved by the FDA to exclude DVT/VTE. Results should be correlated with clinical presentation. Performed at [facility], [place] [address] [place], [place] [address]   POCT i-Stat troponin I     Status: None   Collection Time: 07/25/18  2:34 PM  Result Value Ref Range   Troponin i, poc 0.00 0.00  - 0.08 ng/mL   Comment 3            Comment: Due to the release kinetics of cTnI, a negative result within the first hours of the onset of symptoms does not rule out myocardial infarction with certainty. If myocardial infarction is still suspected, repeat the test at appropriate intervals.   POCT i-Stat troponin I     Status: None   Collection Time: 07/25/18  5:14 PM  Result Value Ref Range   Troponin i, poc 0.01 0.00 - 0.08 ng/mL   Comment 3            Comment: Due to the release kinetics of cTnI, a negative result within the first hours of the onset of symptoms does not rule out myocardial infarction with certainty. If myocardial infarction is still suspected, repeat the test at appropriate intervals.    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 No shortness of breath. History of smoking. EXAM: CHEST - 2 VIEW COMPARISON:  None. FINDINGS: The lungs are well-expanded. The interstitial markings are coarse. There is no alveolar infiltrate, pleural effusion, or pneumothorax. The heart and pulmonary vascularity are normal. The mediastinum is normal in width. The trachea is midline. The bony thorax exhibits no acute abnormality. IMPRESSION: Chronic bronchitic-smoking related changes. No pneumonia, CHF, nor other acute cardiopulmonary abnormality. Electronically Signed   By: David  Martinique M.D.   On: 07/25/2018 14:02   Ct Angio Chest Pe W/cm &/or Wo Cm  Result Date: 07/25/2018 CLINICAL DATA:  Elevated D-dimer. Chest pain for several hours. EXAM: CT ANGIOGRAPHY CHEST WITH CONTRAST TECHNIQUE: Multidetector CT imaging of the chest was performed using the standard protocol during bolus administration of intravenous contrast. Multiplanar CT image reconstructions and MIPs were obtained to evaluate the vascular anatomy. CONTRAST:  160m ISOVUE-370 IOPAMIDOL (ISOVUE-370) INJECTION 76% COMPARISON:  Chest radiograph  from earlier today. FINDINGS: Cardiovascular: The study is moderate quality for the evaluation of pulmonary embolism, limited by motion degradation particularly in the lower lungs. There are no filling defects in the central, lobar, segmental or subsegmental pulmonary artery branches to suggest acute pulmonary embolism. Mildly atherosclerotic nonaneurysmal thoracic aorta. Normal caliber pulmonary arteries. Normal heart size. No significant pericardial fluid/thickening. Mediastinum/Nodes: No discrete thyroid nodules. Unremarkable esophagus. No axillary adenopathy. High left mediastinal mildly enlarged 1.1 cm node (series 4/image 25). No additional pathologically enlarged mediastinal nodes. No  hilar adenopathy. Lungs/Pleura: No pneumothorax. No pleural effusion. Mild centrilobular and paraseptal emphysema with mild diffuse bronchial wall thickening. No acute consolidative airspace disease or lung masses. A few scattered small solid pulmonary nodules in the right lung, largest 5 mm in the right upper lobe anteriorly (series 6/image 52). Mild patchy subpleural reticulation and ground-glass attenuation throughout both lungs. Upper abdomen: No acute abnormality. Musculoskeletal: No aggressive appearing focal osseous lesions. Mild gynecomastia asymmetric to the right. Mild thoracic spondylosis. Review of the MIP images confirms the above findings. IMPRESSION: 1. Motion degraded scan. No evidence of pulmonary embolism. 2. Mild emphysema with mild diffuse bronchial wall thickening, suggesting COPD. 3. Mild patchy subpleural reticulation and ground-glass attenuation throughout both lungs, poorly evaluated on this motion degraded scan. If there is clinical concern for interstitial lung disease, a follow-up high-resolution chest CT study could be obtained in 3-6 months for further evaluation. 4. Scattered small solid pulmonary nodules, largest 5 mm. No follow-up needed if patient is low-risk (and has no known or suspected  primary neoplasm). Non-contrast chest CT can be considered in 12 months if patient is high-risk. This recommendation follows the consensus statement: Guidelines for Management of Incidental Pulmonary Nodules Detected on CT Images:From the Fleischner Society 2017; published online before print (10.1148/radiol.7169678938). 5. Nonspecific mild left mediastinal lymphadenopathy, which could also be followed up on high-resolution chest CT study in 3-6 months. Aortic Atherosclerosis (ICD10-I70.0) and Emphysema (ICD10-J43.9). Electronically Signed   By: Ilona Sorrel M.D.   On: 07/25/2018 19:20    Pending Labs Unresulted Labs (From admission, onward)    Start     Ordered   07/25/18 2004  Glucose, capillary  Once,   R     07/25/18 2004          Vitals/Pain Today's Vitals   07/25/18 1909 07/25/18 1930 07/25/18 1956 07/25/18 2001  BP:  (!) 150/97 (!) 150/97   Pulse: (!) 45 (!) 46 (!) 53   Resp: 13 (!) 21 17   Temp:      TempSrc:      SpO2: 97% 97% 97%   Weight:      Height:      PainSc:    0-No pain    Isolation Precautions No active isolations  Medications Medications  iopamidol (ISOVUE-370) 76 % injection (has no administration in time range)  sodium chloride (PF) 0.9 % injection (has no administration in time range)  aspirin chewable tablet 324 mg (has no administration in time range)  iopamidol (ISOVUE-370) 76 % injection 100 mL (100 mLs Intravenous Contrast Given 07/25/18 1858)    Mobility walks

## 2018-07-25 NOTE — ED Notes (Signed)
Hand-off report given to [facility].  

## 2018-07-25 NOTE — ED Provider Notes (Signed)
[facility] DEPT Provider Note   CSN: [id number] Arrival date & time: 07/25/18  1338     History   Chief Complaint Chief Complaint  Patient presents with  . Chest Pain    HPI Cody Joyce is a 57 y.o. male.  HPI   56 YOM arrives at the emergency department complaining of chest pain. 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 It is described as sharp and pressurelike. It is located in the substernal to right region, nonradiating.  Patient reports that at the time of evaluation, the chest pain has largely resolved.  He did report that when the chest pain initially started, he tried sitting down and resting, however as soon as he reinitiated activity, he began having more pain. Pt denies  nausea, vomiting, diaphoresis, dyspnea, syncope, palpitations, fever, peripheral edema, abdominal pain, cough, or hemoptysis.  Patient is a daily smoker of cigars.  Patient reports a chronic cough productive of white sputum.  He does report early CAD in his father in his early 65s requiring stents.  Patient is also daily alcohol user, typically drinking 2-3 beers per day, however he reports that on a heavier evening, he may drink a sixpack.  Patient denies ever being diagnosed with CAD, or liver disease.  Patient denies any recent immobilization, hospitalization, hormone use, cancer treatment, history DVT/PE, recent surgeries.   History reviewed. No pertinent past medical history.  Patient Active Problem List   Diagnosis Date Noted  . Quadriceps strain 05/30/2013  . Left knee pain 05/30/2013  . 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 Osteoarthritis of left knee 05/30/2013    History reviewed. No pertinent surgical history.      Home Medications    Prior to Admission medications   Medication Sig Start Date End Date Taking? Authorizing Provider  omeprazole (PRILOSEC) 40 MG capsule Take 40 mg by mouth daily.   Yes [provider]    Family History Family History  Problem Relation Age of Onset  . Glaucoma Mother   . Heart disease Father        stents/CAD/AMI age 17s.  . Glaucoma Father     Social History Social History   Tobacco Use  . Smoking status: Light Tobacco Smoker  . Smokeless tobacco: Never Used  Substance Use Topics  . Alcohol use: Yes    Alcohol/week: 6.0 standard drinks    Types: 2 Glasses of wine, 4 Cans of beer per week  . Drug use: No     Allergies   Patient has no known allergies.   Review of Systems Review of Systems  Constitutional: Negative for chills and fever.  HENT: Negative for congestion and sore throat.   Eyes: Negative for visual disturbance.  Respiratory: Positive for chest tightness. Negative for cough and shortness of breath.   Cardiovascular: Positive for chest pain. Negative for palpitations and leg swelling.  Gastrointestinal: Negative for abdominal pain, nausea and vomiting.  Genitourinary: Negative for dysuria and flank pain.  Musculoskeletal: Negative for back pain and myalgias.  Skin: Negative for rash.  Neurological: Negative for dizziness, syncope, light-headedness and headaches.     Physical Exam Updated Vital Signs BP (!) 147/98 (BP Location: Left Arm)   Pulse (!) 51   Temp 98.8 F (37.1 C) (Oral)   Resp 10   Ht 6\' 1"  (1.854 m)   Wt 79.4 kg   SpO2 99%   BMI 23.09 kg/m   Physical Exam  Constitutional: He appears  well-developed and well-nourished. No distress.  HENT:  Head: Normocephalic and atraumatic.  Mouth/Throat: Oropharynx is clear and moist.  Eyes: Pupils are equal, round, and reactive to light. Conjunctivae and EOM are normal.  Neck: Normal range of motion. Neck supple.  Cardiovascular: Normal rate, regular rhythm, S1 normal and S2 normal.  No murmur heard. Pulses:      Radial pulses are 2+ on the right side, and 2+ on the left side.       Dorsalis pedis pulses are 2+ on the right side, and 2+ on the left side.    Pulmonary/Chest: Effort normal. He has no wheezes.  Coarse crackles in bilateral lung bases.  Abdominal: Soft. He exhibits no distension. There is no tenderness. There is no guarding.  Musculoskeletal: Normal range of motion. He exhibits no edema or deformity.  Lymphadenopathy:    He has no cervical adenopathy.  Neurological: He is alert.  Cranial nerves grossly intact. Patient moves extremities symmetrically and with good coordination.  Skin: Skin is warm and dry. No rash noted. No erythema.  Psychiatric: He has a normal mood and affect. His behavior is normal. Judgment and thought content normal.  Nursing note and vitals reviewed.    ED Treatments / Results  Labs (all labs ordered are listed, but only abnormal results are displayed) Labs Reviewed  BASIC METABOLIC PANEL - Abnormal; Notable for the following components:      Result Value   Glucose, Bld 69 (*)    All other components within normal limits  CBC - Abnormal; Notable for the following components:   RDW 16.4 (*)    Platelets 125 (*)    All other components within normal limits  HEPATIC FUNCTION PANEL - Abnormal; Notable for the following components:   Albumin 3.3 (*)    All other components within normal limits  D-DIMER, QUANTITATIVE (NOT AT [facility]) - Abnormal; Notable for the following components:   D-Dimer, Quant 2.41 (*)    All other components within normal limits  I-STAT TROPONIN, ED  POCT I-STAT TROPONIN I    EKG EKG Interpretation  Date/Time:  Tuesday July 25 2018 13:50:44 EST Ventricular Rate:  63 PR Interval:  148 QRS Duration: 86 QT Interval:  396 QTC Calculation: 405 R Axis:   68 Text Interpretation:  Normal sinus rhythm with sinus arrhythmia Possible Left atrial enlargement Nonspecific T wave abnormality Abnormal ECG Confirmed by Virgel Manifold [phone]) on 07/25/2018 4:16:36 PM   Radiology Dg Chest 2 View  Result Date: 07/25/2018 CLINICAL DATA:  Crushing chest pain for the past few hours  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 No shortness of breath. History of smoking. EXAM: CHEST - 2 VIEW COMPARISON:  None. FINDINGS: The lungs are well-expanded. The interstitial markings are coarse. There is no alveolar infiltrate, pleural effusion, or pneumothorax. The heart and pulmonary vascularity are normal. The mediastinum is normal in width. The trachea is midline. The bony thorax exhibits no acute abnormality. IMPRESSION: Chronic bronchitic-smoking related changes. No pneumonia, CHF, nor other acute cardiopulmonary abnormality. Electronically Signed   By: David  Martinique M.D.   On: 07/25/2018 14:02    Procedures Procedures (including critical care time)  Medications Ordered in ED Medications - No data to display   Initial Impression / Assessment and Plan / ED Course  I have reviewed the triage vital signs and the nursing notes.  Pertinent labs & imaging results that were available during my care of the patient were reviewed by me and considered in my medical decision making (see  chart for details).  Clinical Course as of Jul 26 2007  Tue Jul 25, 2018  1943 Continues to be in sinus brady.   Pulse Rate(!): 45 [AM]  2008 Spoke with Dr. Myna Hidalgo who will admit patient. I appreciate his involvement in the care of this patient.    [AM]    Clinical Course User Index [AM] Albesa Seen, PA-C    Differential diagnosis includes ACS, PE, thoracic aortic dissection, Boerhaave's syndrome, cardiac tamponade, pneumothorax, incarcerated diaphragmatic hernia, cholecystitis, esophageal spasm, gastroesophageal reflux, herpes zoster of the thorax, pericarditis, pneumonia, chest wall pain, costochondritis.   Patient is mostly chest pain-free at time of evaluation, but does have story concerning for possible unstable angina.  Delta troponin is negative, and EKG demonstrates no evidence of ischemia, infarction, or arrhythmia, and but remains in sinus bradycardia.  HEART score 4 (story, risk factors, age). Doubt PE as  patient had negative CTPA, and patient not tachycardic.  Patient did have groundglass opacities as well as multiple pulmonary nodules on chest CT.  These were noted to patient, and he was informed that he will need close follow-up with his primary care provider as well as repeat scan in approximately 3 to 6 months for the ground glass opacities.  Doubt TAD by hx, CXR showed no widening mediastinum, and pulses equal in all extremities. Patient remained nontoxic appearing and in no acute distress during emergency department course. Vital signs stable in the emergency department. Therefore, doubt esophageal rupture, cardiac tamponade, or pneumothorax. Pericarditis less likely due to no preceding infectious symptoms and pain not improved in upright positions. Abnormal labs include thrombocytopenia, hypoalbuminemia.  Based on patient's alcohol use, I do have concerns for possible underlying liver disease with his thrombocytopenia and hypoalbuminemia.Due to risk of MACE, admission is advised at this time.  Case discussed with Dr. Virgel Manifold, attending physician, who is in agreement with plan of care.  Patient be admitted to [facility] for chest pain rule out.  Appreciate their involvement in his care.  Patient and family understand and are in agreement with plan of care.  Final Clinical Impressions(s) / ED Diagnoses   Final diagnoses:  Chest pain, unspecified type  Thrombocytopenia [facility])  Hypoalbuminemia  Pulmonary nodules    ED Discharge Orders    None       Tamala Julian 07/25/18 2010    Virgel Manifold, MD 07/25/18 2106

## 2018-07-25 NOTE — ED Notes (Signed)
[facility] contacted for transport to [place] 

## 2018-07-25 NOTE — Progress Notes (Signed)
Patient arrived via [facility] from [facility] and was oriented to the unit. Patient is stable and [facility] admits has been paged. Will continue to monitor.

## 2018-07-25 NOTE — H&P (Signed)
History and Physical    LUISFELIPE Joyce SWF:[id number] DOB: 1961/02/25 DOA: 07/25/2018  PCP: Patient, No Pcp Per   Patient coming from: Home   Chief Complaint: Chest pain   HPI: Cody Joyce is a 57 y.o. male with medical history significant for alcohol dependence and tobacco abuse, now presenting to the emergency department for evaluation of chest pain.  Patient reports that he was mopping a floor when he developed acute and severe pain in his chest.  Pain was localized to just right of center, severe in intensity, pressure-like in character, worse with activity, better at rest, and with mild shortness of breath, but no nausea or diaphoresis.  He reports similar symptoms previously that were attributed to musculoskeletal injury.  Denies any cough, fevers, or chills.  Denies any pain or swelling in the lower extremities.  ED Course: Upon arrival to the ED, patient is found to be afebrile, saturating well on room air, and with heart rate in the mid 40s.  EKG features a sinus rhythm with sinus arrhythmia and nonspecific T wave abnormalities.  Chest x-ray is notable for chronic bronchitic changes.  CTA chest is negative for PE, but notable for mild emphysematous changes, scattered nodules, and mediastinal adenopathy.  Chemistry panel is notable for serum glucose of 69, CBC features a slight thrombus cytopenia, and troponin is negative.  Patient was given a full dose aspirin in the ED.  He remains hemodynamically stable, pain has resolved, and he will be observed for ongoing evaluation and management.  Review of Systems:  All other systems reviewed and apart from HPI, are negative.  History reviewed. No pertinent past medical history.  History reviewed. No pertinent surgical history.   reports that he has been smoking. He has never used smokeless tobacco. He reports that he drinks about 6.0 standard drinks of alcohol per week. He reports that he does not use drugs.  No Known Allergies  Family  History  Problem Relation Age of Onset  . Glaucoma Mother   . Heart disease Father        stents/CAD/AMI age 13s.  . Glaucoma Father      Prior to Admission medications   Medication Sig Start Date End Date Taking? Authorizing Provider  omeprazole (PRILOSEC) 40 MG capsule Take 40 mg by mouth daily.   Yes [provider]    Physical Exam: Vitals:   07/25/18 1905 07/25/18 1909 07/25/18 1930 07/25/18 1956  BP:   (!) 150/97 (!) 150/97  Pulse: (!) 42 (!) 45 (!) 46 (!) 53  Resp: 10 13 (!) 21 17  Temp:      TempSrc:      SpO2: 98% 97% 97% 97%  Weight:      Height:         Constitutional: NAD, calm Eyes: PERTLA, lids and conjunctivae normal ENMT: Mucous membranes are moist. Posterior pharynx clear of any exudate or lesions.   Neck: normal, supple, no masses, no thyromegaly Respiratory: clear to auscultation bilaterally, no wheezing, no crackles. Normal respiratory effort.    Cardiovascular: Rate ~48 and regular. No extremity edema.   Abdomen: No distension, no tenderness, no masses palpated. Bowel sounds normal.  Musculoskeletal: no clubbing / cyanosis. No joint deformity upper and lower extremities.   Skin: no significant rashes, lesions, ulcers. Warm, dry, well-perfused. Neurologic: CN 2-12 grossly intact. Sensation intact. Strength 5/5 in all 4 limbs.  Psychiatric:  Alert and oriented x 3. Calm, cooperative.    Labs on Admission: I have personally  reviewed following labs and imaging studies  CBC: Recent Labs  Lab 07/25/18 1430  WBC 8.3  HGB 15.9  HCT 47.5  MCV 84.7  PLT 914*   Basic Metabolic Panel: Recent Labs  Lab 07/25/18 1430  NA 143  K 4.0  CL 108  CO2 29  GLUCOSE 69*  BUN 16  CREATININE 1.06  CALCIUM 8.9   GFR: Estimated Creatinine Clearance: 85.3 mL/min (by C-G formula based on SCr of 1.06 mg/dL). Liver Function Tests: Recent Labs  Lab 07/25/18 1430  AST 22  ALT 20  ALKPHOS 83  BILITOT 0.6  PROT 7.1  ALBUMIN 3.3*   No results  for input(s): LIPASE, AMYLASE in the last 168 hours. No results for input(s): AMMONIA in the last 168 hours. Coagulation Profile: No results for input(s): INR, PROTIME in the last 168 hours. Cardiac Enzymes: No results for input(s): CKTOTAL, CKMB, CKMBINDEX, TROPONINI in the last 168 hours. BNP (last 3 results) No results for input(s): PROBNP in the last 8760 hours. HbA1C: No results for input(s): HGBA1C in the last 72 hours. CBG: Recent Labs  Lab 07/25/18 2004  GLUCAP 94   Lipid Profile: No results for input(s): CHOL, HDL, LDLCALC, TRIG, CHOLHDL, LDLDIRECT in the last 72 hours. Thyroid Function Tests: No results for input(s): TSH, T4TOTAL, FREET4, T3FREE, THYROIDAB in the last 72 hours. Anemia Panel: No results for input(s): VITAMINB12, FOLATE, FERRITIN, TIBC, IRON, RETICCTPCT in the last 72 hours. Urine analysis:    Component Value Date/Time   BILIRUBINUR neg 01/12/2013 1714   PROTEINUR neg 01/12/2013 1714   UROBILINOGEN 0.2 01/12/2013 1714   NITRITE neg 01/12/2013 1714   LEUKOCYTESUR Trace 01/12/2013 1714   Sepsis Labs: @LABRCNTIP (procalcitonin:4,lacticidven:4) )No results found for this or any previous visit (from the past 240 hour(s)).   Radiological Exams on Admission: Dg Chest 2 View  Result Date: 07/25/2018 CLINICAL DATA:  Crushing chest pain for the past few hours 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 No shortness of breath. History of smoking. EXAM: CHEST - 2 VIEW COMPARISON:  None. FINDINGS: The lungs are well-expanded. The interstitial markings are coarse. There is no alveolar infiltrate, pleural effusion, or pneumothorax. The heart and pulmonary vascularity are normal. The mediastinum is normal in width. The trachea is midline. The bony thorax exhibits no acute abnormality. IMPRESSION: Chronic bronchitic-smoking related changes. No pneumonia, CHF, nor other acute cardiopulmonary abnormality. Electronically Signed   By: David  Martinique M.D.   On: 07/25/2018 14:02   Ct Angio  Chest Pe W/cm &/or Wo Cm  Result Date: 07/25/2018 CLINICAL DATA:  Elevated D-dimer. Chest pain for several hours. EXAM: CT ANGIOGRAPHY CHEST WITH CONTRAST TECHNIQUE: Multidetector CT imaging of the chest was performed using the standard protocol during bolus administration of intravenous contrast. Multiplanar CT image reconstructions and MIPs were obtained to evaluate the vascular anatomy. CONTRAST:  184mL ISOVUE-370 IOPAMIDOL (ISOVUE-370) INJECTION 76% COMPARISON:  Chest radiograph from earlier today. FINDINGS: Cardiovascular: The study is moderate quality for the evaluation of pulmonary embolism, limited by motion degradation particularly in the lower lungs. There are no filling defects in the central, lobar, segmental or subsegmental pulmonary artery branches to suggest acute pulmonary embolism. Mildly atherosclerotic nonaneurysmal thoracic aorta. Normal caliber pulmonary arteries. Normal heart size. No significant pericardial fluid/thickening. Mediastinum/Nodes: No discrete thyroid nodules. Unremarkable esophagus. No axillary adenopathy. High left mediastinal mildly enlarged 1.1 cm node (series 4/image 25). No additional pathologically enlarged mediastinal nodes. No hilar adenopathy. Lungs/Pleura: No pneumothorax. No pleural effusion. Mild centrilobular and paraseptal emphysema with mild diffuse bronchial  wall thickening. No acute consolidative airspace disease or lung masses. A few scattered small solid pulmonary nodules in the right lung, largest 5 mm in the right upper lobe anteriorly (series 6/image 52). Mild patchy subpleural reticulation and ground-glass attenuation throughout both lungs. Upper abdomen: No acute abnormality. Musculoskeletal: No aggressive appearing focal osseous lesions. Mild gynecomastia asymmetric to the right. Mild thoracic spondylosis. Review of the MIP images confirms the above findings. IMPRESSION: 1. Motion degraded scan. No evidence of pulmonary embolism. 2. Mild emphysema with  mild diffuse bronchial wall thickening, suggesting COPD. 3. Mild patchy subpleural reticulation and ground-glass attenuation throughout both lungs, poorly evaluated on this motion degraded scan. If there is clinical concern for interstitial lung disease, a follow-up high-resolution chest CT study could be obtained in 3-6 months for further evaluation. 4. Scattered small solid pulmonary nodules, largest 5 mm. No follow-up needed if patient is low-risk (and has no known or suspected primary neoplasm). Non-contrast chest CT can be considered in 12 months if patient is high-risk. This recommendation follows the consensus statement: Guidelines for Management of Incidental Pulmonary Nodules Detected on CT Images:From the Fleischner Society 2017; published online before print (10.1148/radiol.[id number]). 5. Nonspecific mild left mediastinal lymphadenopathy, which could also be followed up on high-resolution chest CT study in 3-6 months. Aortic Atherosclerosis (ICD10-I70.0) and Emphysema (ICD10-J43.9). Electronically Signed   By: Ilona Sorrel M.D.   On: 07/25/2018 19:20    EKG: Independently reviewed. Sinus rhythm with sinus arrhythmia, non-specific T-wave abnormality.   Assessment/Plan   1. Chest pain   - Presents with acute-onset of severe chest pain  - D-dimer elevated by CTA negative for PE  - Troponin 0.00, then 0.01 in ED  - EKG with non-specific T-wave abnormality - Continue cardiac monitoring, obtain serial troponin measurements, repeat EKG, continue ASA    2. Bradycardia  - HR in mid-40's to mid-50's in ED  - Not on any AV nodal blockers, possibly ischemic though appears to be chronic based on vitals from office visits   - Continue cardiac monitoring, serial enzymes   3. Alcohol dependence  - No signs of withdrawal on admission  - Monitor with CIWA, use Ativan as-needed, and supplement b-vitamins    4. Lung nodules, mediastinal LAD  - Noted incidentally on CTA chest  - Follow-up with  high-res CT recommended in 3-6 months     DVT prophylaxis: Lovenox  Code Status: Full  Family Communication: Wife updated at bedside Consults called: None Admission status: observation    Vianne Bulls, MD [facility] Pager [phone]  If 7PM-7AM, please contact night-coverage [email or web address] Password [email or web address]  07/25/2018, 8:25 PM

## 2018-07-25 NOTE — ED Triage Notes (Addendum)
Pt complains of crushing chest pain for the past few hours. Pt denies shortness of breath. Pt states he was mopping when the pain started.

## 2018-07-26 DIAGNOSIS — D696 Thrombocytopenia, unspecified: Secondary | ICD-10-CM

## 2018-07-26 DIAGNOSIS — R079 Chest pain, unspecified: Secondary | ICD-10-CM

## 2018-07-26 DIAGNOSIS — F1020 Alcohol dependence, uncomplicated: Secondary | ICD-10-CM

## 2018-07-26 DIAGNOSIS — R918 Other nonspecific abnormal finding of lung field: Secondary | ICD-10-CM

## 2018-07-26 DIAGNOSIS — R590 Localized enlarged lymph nodes: Secondary | ICD-10-CM

## 2018-07-26 LAB — TROPONIN I

## 2018-07-26 MED ORDER — VARENICLINE TARTRATE 0.5 MG X 11 & 1 MG X 42 PO MISC: ORAL | 0 refills | Status: DC

## 2018-07-26 NOTE — Discharge Summary (Signed)
Physician Discharge Summary  Cody Joyce DVV:[id number] DOB: February 07, 1961 DOA: 07/25/2018  PCP: Patient, No Pcp Per  Admit date: 07/25/2018 Discharge date: 07/26/2018  Admitted From: Home Disposition: Home  Recommendations for Outpatient Follow-up:  1. Follow up with in [place] on 12/4 2019 as scheduled  Home Health: None Equipment/Devices: None  Discharge Condition: Stable CODE STATUS: Full code Diet recommendation: Heart healthy  HPI: Per Dr. Myna Hidalgo, Cody Joyce is a 57 y.o. male with medical history significant for alcohol dependence and tobacco abuse, now presenting to the emergency department for evaluation of chest pain.  Patient reports that he was mopping a floor when he developed acute and severe pain in his chest.  Pain was localized to just right of center, severe in intensity, pressure-like in character, worse with activity, better at rest, and with mild shortness of breath, but no nausea or diaphoresis.  He reports similar symptoms previously that were attributed to musculoskeletal injury.  Denies any cough, fevers, or chills.  Denies any pain or swelling in the lower extremities.   Hospital Course: Chest pain -patient presented to the emergency room with severe right-sided chest pain.  His d-dimer was elevated and he underwent a CT angiogram which was negative for PE.  He was then admitted to the hospital for serial cardiac enzymes.  These have remained negative.  His EKG was nonischemic.  His chest pain was quite atypical for cardiac origin as it was reproducible with palpation as well as worse when patient would turn towards right or left, thus suggesting MSK origin much more likely.  He is fairly active at baseline, works as a Retail buyer and he has no difficulties caring his ADLs or work, no dyspnea, no exertional chest pain, has no history of lower extremity swelling.  His heart rate is in the mid 40s to mid 50s, asymptomatic, normotensive, do not  suspect this is acute. Alcohol dependence -no signs of withdrawal, he was monitored on CIWA while hospitalized. Lung nodules/mediastinal LAD/CT scan finding of emphysema/COPD in the setting of ongoing tobacco use -we will need repeat imaging as an outpatient, strongly counseled for tobacco cessation and he is agreeable and would like Chantix, this was prescribed on patient's discharge. Thrombocytopenia -?  Related to EtOH intake, counseled for cessation and would need repeat blood work in the future as an outpatient  Discharge Diagnoses:  Principal Problem:   Chest pain Active Problems:   Bradycardia   Alcohol dependence ([place])   Mediastinal adenopathy   Multiple lung nodules on CT   Discharge Instructions   Allergies as of 07/26/2018   No Known Allergies     Medication List    TAKE these medications   omeprazole 40 MG capsule Commonly known as:  PRILOSEC Take 40 mg by mouth daily.   varenicline 0.5 MG X 11 & 1 MG X 42 tablet Commonly known as:  CHANTIX PAK Take one 0.5 mg tablet by mouth once daily for 3 days, then increase to one 0.5 mg tablet twice daily for 4 days, then increase to one 1 mg tablet twice daily.      Follow-up Information    [place]. Go on 07/10/2018.   Why:  @9 ;30am Contact information: [address] [place] [address] [phone]          Consultations:  None   Procedures/Studies:  Dg Chest 2 View  Result Date: 07/25/2018 CLINICAL DATA:  Crushing chest pain for the past  few hours 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 No shortness of breath. History of smoking. EXAM: CHEST - 2 VIEW COMPARISON:  None. FINDINGS: The lungs are well-expanded. The interstitial markings are coarse. There is no alveolar infiltrate, pleural effusion, or pneumothorax. The heart and pulmonary vascularity are normal. The mediastinum is normal in width. The trachea is midline. The bony thorax exhibits no acute abnormality.  IMPRESSION: Chronic bronchitic-smoking related changes. No pneumonia, CHF, nor other acute cardiopulmonary abnormality. Electronically Signed   By: David  Martinique M.D.   On: 07/25/2018 14:02   Ct Angio Chest Pe W/cm &/or Wo Cm  Result Date: 07/25/2018 CLINICAL DATA:  Elevated D-dimer. Chest pain for several hours. EXAM: CT ANGIOGRAPHY CHEST WITH CONTRAST TECHNIQUE: Multidetector CT imaging of the chest was performed using the standard protocol during bolus administration of intravenous contrast. Multiplanar CT image reconstructions and MIPs were obtained to evaluate the vascular anatomy. CONTRAST:  146mL ISOVUE-370 IOPAMIDOL (ISOVUE-370) INJECTION 76% COMPARISON:  Chest radiograph from earlier today. FINDINGS: Cardiovascular: The study is moderate quality for the evaluation of pulmonary embolism, limited by motion degradation particularly in the lower lungs. There are no filling defects in the central, lobar, segmental or subsegmental pulmonary artery branches to suggest acute pulmonary embolism. Mildly atherosclerotic nonaneurysmal thoracic aorta. Normal caliber pulmonary arteries. Normal heart size. No significant pericardial fluid/thickening. Mediastinum/Nodes: No discrete thyroid nodules. Unremarkable esophagus. No axillary adenopathy. High left mediastinal mildly enlarged 1.1 cm node (series 4/image 25). No additional pathologically enlarged mediastinal nodes. No hilar adenopathy. Lungs/Pleura: No pneumothorax. No pleural effusion. Mild centrilobular and paraseptal emphysema with mild diffuse bronchial wall thickening. No acute consolidative airspace disease or lung masses. A few scattered small solid pulmonary nodules in the right lung, largest 5 mm in the right upper lobe anteriorly (series 6/image 52). Mild patchy subpleural reticulation and ground-glass attenuation throughout both lungs. Upper abdomen: No acute abnormality. Musculoskeletal: No aggressive appearing focal osseous lesions. Mild  gynecomastia asymmetric to the right. Mild thoracic spondylosis. Review of the MIP images confirms the above findings. IMPRESSION: 1. Motion degraded scan. No evidence of pulmonary embolism. 2. Mild emphysema with mild diffuse bronchial wall thickening, suggesting COPD. 3. Mild patchy subpleural reticulation and ground-glass attenuation throughout both lungs, poorly evaluated on this motion degraded scan. If there is clinical concern for interstitial lung disease, a follow-up high-resolution chest CT study could be obtained in 3-6 months for further evaluation. 4. Scattered small solid pulmonary nodules, largest 5 mm. No follow-up needed if patient is low-risk (and has no known or suspected primary neoplasm). Non-contrast chest CT can be considered in 12 months if patient is high-risk. This recommendation follows the consensus statement: Guidelines for Management of Incidental Pulmonary Nodules Detected on CT Images:From the Fleischner Society 2017; published online before print (10.1148/radiol.4627035009). 5. Nonspecific mild left mediastinal lymphadenopathy, which could also be followed up on high-resolution chest CT study in 3-6 months. Aortic Atherosclerosis (ICD10-I70.0) and Emphysema (ICD10-J43.9). Electronically Signed   By: Ilona Sorrel M.D.   On: 07/25/2018 19:20     Subjective: - no chest pain, shortness of breath, no abdominal pain, nausea or vomiting.   Discharge Exam: Vitals:   07/26/18 0048 07/26/18 0443  BP: 127/78 (!) 142/87  Pulse: (!) 48 (!) 47  Resp: 18 18  Temp: 98.4 F (36.9 C) 97.8 F (36.6 C)  SpO2: 97% 97%    General: Pt is alert, awake, not in acute distress Cardiovascular: RRR, S1/S2 +, no rubs, no gallops Respiratory: CTA bilaterally, no wheezing, no rhonchi Abdominal: Soft, NT,  ND, bowel sounds + Extremities: no edema, no cyanosis    The results of significant diagnostics from this hospitalization (including imaging, microbiology, ancillary and laboratory) are  listed below for reference.     Microbiology: No results found for this or any previous visit (from the past 240 hour(s)).   Labs: BNP (last 3 results) No results for input(s): BNP in the last 8760 hours. Basic Metabolic Panel: Recent Labs  Lab 07/25/18 1430  NA 143  K 4.0  CL 108  CO2 29  GLUCOSE 69*  BUN 16  CREATININE 1.06  CALCIUM 8.9   Liver Function Tests: Recent Labs  Lab 07/25/18 1430  AST 22  ALT 20  ALKPHOS 83  BILITOT 0.6  PROT 7.1  ALBUMIN 3.3*   No results for input(s): LIPASE, AMYLASE in the last 168 hours. No results for input(s): AMMONIA in the last 168 hours. CBC: Recent Labs  Lab 07/25/18 1430  WBC 8.3  HGB 15.9  HCT 47.5  MCV 84.7  PLT 125*   Cardiac Enzymes: Recent Labs  Lab 07/26/18 0739  TROPONINI <0.03   BNP: Invalid input(s): POCBNP CBG: Recent Labs  Lab 07/25/18 2004  GLUCAP 94   D-Dimer Recent Labs    07/25/18 1430  DDIMER 2.41*   Hgb A1c No results for input(s): HGBA1C in the last 72 hours. Lipid Profile No results for input(s): CHOL, HDL, LDLCALC, TRIG, CHOLHDL, LDLDIRECT in the last 72 hours. Thyroid function studies No results for input(s): TSH, T4TOTAL, T3FREE, THYROIDAB in the last 72 hours.  Invalid input(s): FREET3 Anemia work up No results for input(s): VITAMINB12, FOLATE, FERRITIN, TIBC, IRON, RETICCTPCT in the last 72 hours. Urinalysis    Component Value Date/Time   BILIRUBINUR neg 01/12/2013 1714   PROTEINUR neg 01/12/2013 1714   UROBILINOGEN 0.2 01/12/2013 1714   NITRITE neg 01/12/2013 1714   LEUKOCYTESUR Trace 01/12/2013 1714   Sepsis Labs Invalid input(s): PROCALCITONIN,  WBC,  LACTICIDVEN   Time coordinating discharge: 25 minutes  SIGNED:  Marzetta Board, MD  [facility] 07/26/2018, 12:22 PM Pager [phone]  If 7PM-7AM, please contact night-coverage [email or web address] Password [email or web address]

## 2018-07-26 NOTE — Progress Notes (Signed)
Pt has orders to be discharged. Discharge instructions given and pt has no additional questions at this time. Medication regimen reviewed and pt educated. Pt verbalized understanding and has no additional questions. Telemetry box removed. IV removed and site in good condition. Pt stable and waiting for transportation. 

## 2018-08-09 ENCOUNTER — Ambulatory Visit
Payer: No Typology Code available for payment source | Attending: Critical Care Medicine | Admitting: Critical Care Medicine

## 2018-08-09 ENCOUNTER — Encounter: Payer: Self-pay | Admitting: Critical Care Medicine

## 2018-08-09 VITALS — BP 149/89 | HR 50 | Temp 98.2°F | Resp 18 | Ht 73.0 in | Wt 180.0 lb

## 2018-08-09 DIAGNOSIS — F17210 Nicotine dependence, cigarettes, uncomplicated: Secondary | ICD-10-CM | POA: Insufficient documentation

## 2018-08-09 DIAGNOSIS — I700 Atherosclerosis of aorta: Secondary | ICD-10-CM

## 2018-08-09 DIAGNOSIS — R918 Other nonspecific abnormal finding of lung field: Secondary | ICD-10-CM | POA: Diagnosis not present

## 2018-08-09 DIAGNOSIS — J449 Chronic obstructive pulmonary disease, unspecified: Secondary | ICD-10-CM | POA: Insufficient documentation

## 2018-08-09 DIAGNOSIS — Z79899 Other long term (current) drug therapy: Secondary | ICD-10-CM | POA: Insufficient documentation

## 2018-08-09 DIAGNOSIS — R079 Chest pain, unspecified: Secondary | ICD-10-CM

## 2018-08-09 DIAGNOSIS — J432 Centrilobular emphysema: Secondary | ICD-10-CM | POA: Diagnosis not present

## 2018-08-09 DIAGNOSIS — R590 Localized enlarged lymph nodes: Secondary | ICD-10-CM

## 2018-08-09 DIAGNOSIS — F1020 Alcohol dependence, uncomplicated: Secondary | ICD-10-CM | POA: Diagnosis not present

## 2018-08-09 HISTORY — DX: Atherosclerosis of aorta: I70.0

## 2018-08-09 NOTE — Assessment & Plan Note (Signed)
CT scan  of chest showed atherosclerosis in the aorta Plan Smoking cessation Observation

## 2018-08-09 NOTE — Assessment & Plan Note (Signed)
Chest pain syndrome resolved likely musculoskeletal in nature with negative cardiac evaluation

## 2018-08-09 NOTE — Patient Instructions (Addendum)
You do not need to obtain the Chantix Return in 4 months for recheck and obtain another CT Chest  Continue to work on getting off tobacco and reduce alcohol consumption

## 2018-08-09 NOTE — Progress Notes (Signed)
Subjective:    Patient ID: Cody Joyce, male    DOB: 06-14-1961, 57 y.o.   MRN: [id number]  57 y.o.M adm 11/19 - 11/20 for : Chest pain -patient presented to the emergency room with severe right-sided chest pain.  His d-dimer was elevated and he underwent a CT angiogram which was negative for PE.  He was then admitted to the hospital for serial cardiac enzymes.  These have remained negative.  His EKG was nonischemic.  His chest pain was quite atypical for cardiac origin as it was reproducible with palpation as well as worse when patient would turn towards right or left, thus suggesting MSK origin much more likely.  He is fairly active at baseline, works as a Retail buyer and he has no difficulties caring his ADLs or work, no dyspnea, no exertional chest pain, has no history of lower extremity swelling.  His heart rate is in the mid 40s to mid 50s, asymptomatic, normotensive, do not suspect this is acute.  Alcohol dependence -no signs of withdrawal, he was monitored on CIWA while hospitalized.  Lung nodules/mediastinal LAD/CT scan finding of emphysema/COPD in the setting of ongoing tobacco use -we will need repeat imaging as an outpatient, strongly counseled for tobacco cessation and he is agreeable and would like Chantix, this was prescribed on patient's discharge.   Thrombocytopenia -?  Related to EtOH intake, counseled for cessation and would need repeat blood work in the future as an outpatient  Since d/c: no sob no chest pain, cough is now gone  Now off cigs and ETOH.   History reviewed. No pertinent past medical history.   Family History  Problem Relation Age of Onset  . Glaucoma Mother   . Heart disease Father        stents/CAD/AMI age 37s.  . Glaucoma Father      Social History   Socioeconomic History  . Marital status: Married    Spouse name: Not on file  . Number of children: Not on file  . Years of education: Not on file  . Highest education level: Not on file    Occupational History  . Not on file  Social Needs  . Financial resource strain: Not on file  . Food insecurity:    Worry: Not on file    Inability: Not on file  . Transportation needs:    Medical: Not on file    Non-medical: Not on file  Tobacco Use  . Smoking status: Light Tobacco Smoker  . Smokeless tobacco: Never Used  Substance and Sexual Activity  . Alcohol use: Yes    Alcohol/week: 6.0 standard drinks    Types: 2 Glasses of wine, 4 Cans of beer per week  . Drug use: No  . Sexual activity: Yes  Lifestyle  . Physical activity:    Days per week: Not on file    Minutes per session: Not on file  . Stress: Not on file  Relationships  . Social connections:    Talks on phone: Not on file    Gets together: Not on file    Attends religious service: Not on file    Active member of club or organization: Not on file    Attends meetings of clubs or organizations: Not on file    Relationship status: Not on file  . Intimate partner violence:    Fear of current or ex partner: Not on file    Emotionally abused: Not on file    Physically abused: Not on  file    Forced sexual activity: Not on file  Other Topics Concern  . Not on file  Social History Narrative   Marital status:  Married x 3 years; not happily married.      Children:  None      Lives: alone.      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      Tobacco: smoke cigars 2 per day.  Quit cigarettes in 2001; smoked x 15 years.      Alcohol:  Weekends 4 beers per week; 2 drinks per week.      Drugs:  None      Exercising:  Three days per week; active job.        Seatbelt:  100%      Guns:  None      Sexual activity: condoms; Gonorrhea in high school.  One partner in past year.  Last STD screening 2013.     No Known Allergies   Outpatient Medications Prior to Visit  Medication Sig Dispense Refill  . omeprazole (PRILOSEC) 40 MG capsule Take 40 mg by mouth daily.    . varenicline (CHANTIX STARTING  MONTH PAK) 0.5 MG X 11 & 1 MG X 42 tablet Take one 0.5 mg tablet by mouth once daily for 3 days, then increase to one 0.5 mg tablet twice daily for 4 days, then increase to one 1 mg tablet twice daily. (Patient not taking: Reported on 08/09/2018) 53 tablet 0   No facility-administered medications prior to visit.    Review of Systems  Constitutional: Negative.   HENT: Negative.  Negative for rhinorrhea, sinus pressure, sinus pain, sneezing and sore throat.   Eyes: Negative.   Respiratory: Negative.   Cardiovascular: Negative.  Negative for chest pain and leg swelling.  Gastrointestinal: Negative.   Endocrine: Negative.   Genitourinary: Negative.   Musculoskeletal: Negative.        Objective:   Physical Exam Vitals:   08/09/18 1012  BP: (!) 149/89  Pulse: (!) 50  Resp: 18  Temp: 98.2 F (36.8 C)  TempSrc: Oral  SpO2: 97%  Weight: 180 lb (81.6 kg)  Height: 6\' 1"  (1.854 m)    Gen: Pleasant, well-nourished, in no distress,  normal affect  ENT: No lesions,  mouth clear,  oropharynx clear, no postnasal drip  Neck: No JVD, no TMG, no carotid bruits  Lungs: No use of accessory muscles, no dullness to percussion, distant breath sounds  Cardiovascular : RRR, heart sounds normal, no murmur or gallops, no peripheral edema  Abdomen: soft and NT, no HSM,  BS normal  Musculoskeletal: No deformities, no cyanosis or clubbing  Neuro: alert, non focal  Skin: Warm, no lesions or rashes  No results found.  CT Chest 11/19 All results from recent admission reviewed IMPRESSION: 1. Motion degraded scan. No evidence of pulmonary embolism. 2. Mild emphysema with mild diffuse bronchial wall thickening, suggesting COPD. 3. Mild patchy subpleural reticulation and ground-glass attenuation throughout both lungs, poorly evaluated on this motion degraded scan. If there is clinical concern for interstitial lung disease, a follow-up high-resolution chest CT study could be obtained in  3-6 months for further evaluation. 4. Scattered small solid pulmonary nodules, largest 5 mm. No follow-up needed if patient is low-risk (and has no known or suspected primary neoplasm). Non-contrast chest CT can be considered in 12 months if patient is high-risk. This recommendation follows the consensus statement: Guidelines for Management of Incidental Pulmonary Nodules Detected on CT Images:From  the Fleischner Society 2017; published online before print (10.1148/radiol.[id number]). 5. Nonspecific mild left mediastinal lymphadenopathy, which could also be followed up on high-resolution chest CT study in 3-6 months.  Aortic Atherosclerosis (ICD10-I70.0) and Emphysema (ICD10-J43.9).     Assessment & Plan:  I personally reviewed all images and lab data in the [facility] system as well as any outside material available during this office visit and agree with the  radiology impressions.   COPD (chronic obstructive pulmonary disease) (HCC) Chronic obstructive lung disease with centrilobular emphysema stable at this time No indication for inhaled medications Plan Continue smoking cessation efforts  Mediastinal adenopathy Mediastinal adenopathy appears to be stable at this time We will repeat imaging in 4 months  Multiple lung nodules on CT Multiple lung nodules seen on CT appear to be pleural-based and scarring and inflammatory in nature  Plan Repeat imaging in 4 months Smoking cessation  Alcohol dependence ([place]) Alcohol dependence now improved  Chest pain Chest pain syndrome resolved likely musculoskeletal in nature with negative cardiac evaluation  Aortic atherosclerosis ([place]) CT scan  of chest showed atherosclerosis in the aorta Plan Smoking cessation Observation   Daegon was seen today for hospitalization follow-up.  Diagnoses and all orders for this visit:  Centrilobular emphysema ([place])  Multiple lung nodules on CT  Mediastinal adenopathy  Uncomplicated alcohol  dependence (HCC)  Chest pain, unspecified type  Aortic atherosclerosis ([place])

## 2018-08-09 NOTE — Assessment & Plan Note (Signed)
Alcohol dependence now improved

## 2018-08-09 NOTE — Assessment & Plan Note (Signed)
Mediastinal adenopathy appears to be stable at this time We will repeat imaging in 4 months

## 2018-08-09 NOTE — Assessment & Plan Note (Signed)
Chronic obstructive lung disease with centrilobular emphysema stable at this time No indication for inhaled medications Plan Continue smoking cessation efforts

## 2018-08-09 NOTE — Assessment & Plan Note (Signed)
Multiple lung nodules seen on CT appear to be pleural-based and scarring and inflammatory in nature  Plan Repeat imaging in 4 months Smoking cessation

## 2018-12-11 ENCOUNTER — Ambulatory Visit: Payer: No Typology Code available for payment source | Admitting: Family Medicine

## 2019-01-08 ENCOUNTER — Ambulatory Visit: Payer: No Typology Code available for payment source | Attending: Family Medicine | Admitting: Family Medicine

## 2019-01-08 ENCOUNTER — Encounter: Payer: Self-pay | Admitting: Family Medicine

## 2019-01-08 ENCOUNTER — Other Ambulatory Visit: Payer: Self-pay

## 2019-01-08 DIAGNOSIS — J432 Centrilobular emphysema: Secondary | ICD-10-CM

## 2019-01-08 DIAGNOSIS — D696 Thrombocytopenia, unspecified: Secondary | ICD-10-CM | POA: Diagnosis not present

## 2019-01-08 DIAGNOSIS — R918 Other nonspecific abnormal finding of lung field: Secondary | ICD-10-CM | POA: Diagnosis not present

## 2019-01-08 NOTE — Progress Notes (Signed)
Per pt he went to the hospital with chest pains 07-25-2018 and everything is going okay right now. Per patient no other episode.   Knee pain   Per pt he's not really sure why he have an appt for today.

## 2019-01-08 NOTE — Progress Notes (Signed)
Virtual Visit via Telephone Note  I connected with Cody Joyce on 01/08/19 at  2:30 PM EDT by telephone and verified that I am speaking with the correct person using two identifiers.   I discussed the limitations, risks, security and privacy concerns of performing an evaluation and management service by telephone and the availability of in person appointments. I also discussed with the patient that there may be a patient responsible charge related to this service. The patient expressed understanding and agreed to proceed.  Patient Location: Car (patient was asked to pull over to a safe location in order to complete his visit) Provider Location: Office Others participating in call: Emilio Aspen, RMA   History of Present Illness:      58 yo male who was last seen in the office on 08/09/18 by Dr. Joya Gaskins status post hospitalization from 07/25/2018 through 07/26/2018 due to chest pain.  During his admission, patient had negative serial cardiac enzymes and EKG was did not show evidence of ischemia.  Patient's chest pain was atypical for cardiac origin as he also had reproducible chest pain more consistent with musculoskeletal origin.  Patient did have CT scan of the chest which showed findings consistent with emphysema/COPD as well as mediastinal lymphadenopathy and lung nodules.  Patient also had thrombocytopenia which was thought to be related to patient's history of alcohol use.  Patient was also advised to stop smoking and offered Chantix.  At his clinic visit, patient was prescribed Chantix which he reports he is no longer using.  Patient states that he has decreased his smoking to 1 small cigar once per day.  He denies any current issues with chest pain or palpitations, no shortness of breath or cough.      Patient reports that he has been attending the [place] for healthcare as well.  Patient states that most recently he has been seen for bilateral knee pain including a torn meniscus and patient  states that he was placed on oral steroids for about 2 weeks which did decrease his knee pain but now that he is completed steroid therapy he is again having pain in his knees and states that he needs to make a follow-up appointment with the [place].  Patient feels that he is otherwise healthy at this time.  He reports no unusual bruising or bleeding related to his low platelet count.  Past Medical History:  Diagnosis Date  . Emphysema of lung ([place])      Family History  Problem Relation Age of Onset  . Glaucoma Mother   . Heart disease Father        stents/CAD/AMI age 82s.  . Glaucoma Father     Social History   Tobacco Use  . Smoking status: Light Tobacco Smoker  . Smokeless tobacco: Never Used  Substance Use Topics  . Alcohol use: Yes    Alcohol/week: 6.0 standard drinks    Types: 2 Glasses of wine, 4 Cans of beer per week  . Drug use: No     No Known Allergies   Review of Systems  Constitutional: Negative for chills and fever.  HENT: Negative for congestion and sore throat.   Respiratory: Negative for cough and shortness of breath.   Cardiovascular: Negative for chest pain and palpitations.  Gastrointestinal: Negative for abdominal pain, constipation, diarrhea, heartburn, nausea and vomiting.  Genitourinary: Negative for dysuria and frequency.  Musculoskeletal: Positive for joint pain. Negative for myalgias.  Skin: Negative for itching and rash.  Neurological: Negative for dizziness  and headaches.  Endo/Heme/Allergies: Negative for polydipsia. Does not bruise/bleed easily.     Observations/Objective: No vital signs or physical exam conducted as visit was done via telephone due to restrictions/limitations of in-office visits related to current COVID-19 pandemic Assessment and Plan: 1. Centrilobular emphysema ([place]) Patient with evidence of emphysema on CT scan done in November 2019.  He continues to smoke 1 small cigar per day by his report.  Patient is reminded of the  importance of complete smoking cessation.  He reports no current lung symptoms.  Patient is advised to make appointment in October for flu shot and pneumonia shot if needed or otherwise have this done at the [place].  2. Multiple lung nodules on CT Patient had CT scan of the lungs during his hospitalization in November of last year and patient had the presence of multiple lung nodules.  Patient states that he will call the [place] regarding follow-up but he was also made aware to please bring the copy of the CT scan or otherwise call this office to have CT scan scheduled.  Patient was also advised to completely stop smoking  3. Thrombocytopenia ([place]) Patient was asked to return to clinic for lab visit at his convenience in the next few weeks for CBC in follow-up of thrombocytopenia. - CBC with Differential; Future CBC Latest Ref Rng & Units 07/25/2018 01/12/2013  WBC 4.0 - 10.5 K/uL 8.3 8.4  Hemoglobin 13.0 - 17.0 g/dL 15.9 16.3  Hematocrit 39.0 - 52.0 % 47.5 49.9  Platelets 150 - 400 K/uL 125(L) -   Follow Up Instructions:Return in about 5 months (around 06/10/2019) for lab visit  within 4 weeks; Oct /fu.    I discussed the assessment and treatment plan with the patient. The patient was provided an opportunity to ask questions and all were answered. The patient agreed with the plan and demonstrated an understanding of the instructions.   The patient was advised to call back or seek an in-person evaluation if the symptoms worsen or if the condition fails to improve as anticipated.  I provided 11 minutes of non-face-to-face time during this encounter.   Antony Blackbird, MD

## 2019-12-29 ENCOUNTER — Emergency Department (HOSPITAL_COMMUNITY): Payer: No Typology Code available for payment source

## 2019-12-29 ENCOUNTER — Inpatient Hospital Stay (HOSPITAL_COMMUNITY)
Admission: EM | Admit: 2019-12-29 | Discharge: 2020-01-11 | DRG: 163 | Disposition: A | Discharge status: Home / Self Care | Payer: No Typology Code available for payment source | Attending: Internal Medicine | Admitting: Internal Medicine

## 2019-12-29 ENCOUNTER — Other Ambulatory Visit: Payer: Self-pay

## 2019-12-29 DIAGNOSIS — Q212 Atrioventricular septal defect: Secondary | ICD-10-CM | POA: Diagnosis not present

## 2019-12-29 DIAGNOSIS — I441 Atrioventricular block, second degree: Secondary | ICD-10-CM | POA: Diagnosis present

## 2019-12-29 DIAGNOSIS — F1010 Alcohol abuse, uncomplicated: Secondary | ICD-10-CM | POA: Diagnosis not present

## 2019-12-29 DIAGNOSIS — K761 Chronic passive congestion of liver: Secondary | ICD-10-CM | POA: Diagnosis present

## 2019-12-29 DIAGNOSIS — I483 Typical atrial flutter: Secondary | ICD-10-CM

## 2019-12-29 DIAGNOSIS — I82412 Acute embolism and thrombosis of left femoral vein: Secondary | ICD-10-CM | POA: Diagnosis present

## 2019-12-29 DIAGNOSIS — E872 Acidosis: Secondary | ICD-10-CM | POA: Diagnosis present

## 2019-12-29 DIAGNOSIS — I2699 Other pulmonary embolism without acute cor pulmonale: Secondary | ICD-10-CM | POA: Diagnosis present

## 2019-12-29 DIAGNOSIS — Z79899 Other long term (current) drug therapy: Secondary | ICD-10-CM

## 2019-12-29 DIAGNOSIS — R0602 Shortness of breath: Secondary | ICD-10-CM

## 2019-12-29 DIAGNOSIS — Q211 Atrial septal defect: Secondary | ICD-10-CM | POA: Diagnosis not present

## 2019-12-29 DIAGNOSIS — I700 Atherosclerosis of aorta: Secondary | ICD-10-CM | POA: Diagnosis present

## 2019-12-29 DIAGNOSIS — R0682 Tachypnea, not elsewhere classified: Secondary | ICD-10-CM

## 2019-12-29 DIAGNOSIS — I2609 Other pulmonary embolism with acute cor pulmonale: Secondary | ICD-10-CM | POA: Diagnosis not present

## 2019-12-29 DIAGNOSIS — I82432 Acute embolism and thrombosis of left popliteal vein: Secondary | ICD-10-CM | POA: Diagnosis present

## 2019-12-29 DIAGNOSIS — Z20822 Contact with and (suspected) exposure to covid-19: Secondary | ICD-10-CM | POA: Diagnosis present

## 2019-12-29 DIAGNOSIS — J80 Acute respiratory distress syndrome: Secondary | ICD-10-CM | POA: Diagnosis present

## 2019-12-29 DIAGNOSIS — F1020 Alcohol dependence, uncomplicated: Secondary | ICD-10-CM | POA: Diagnosis present

## 2019-12-29 DIAGNOSIS — J439 Emphysema, unspecified: Secondary | ICD-10-CM | POA: Diagnosis present

## 2019-12-29 DIAGNOSIS — F17200 Nicotine dependence, unspecified, uncomplicated: Secondary | ICD-10-CM | POA: Diagnosis not present

## 2019-12-29 DIAGNOSIS — R578 Other shock: Secondary | ICD-10-CM | POA: Diagnosis present

## 2019-12-29 DIAGNOSIS — Z8249 Family history of ischemic heart disease and other diseases of the circulatory system: Secondary | ICD-10-CM

## 2019-12-29 DIAGNOSIS — R748 Abnormal levels of other serum enzymes: Secondary | ICD-10-CM | POA: Diagnosis not present

## 2019-12-29 DIAGNOSIS — I4892 Unspecified atrial flutter: Secondary | ICD-10-CM

## 2019-12-29 DIAGNOSIS — I2601 Septic pulmonary embolism with acute cor pulmonale: Secondary | ICD-10-CM | POA: Diagnosis not present

## 2019-12-29 DIAGNOSIS — N280 Ischemia and infarction of kidney: Secondary | ICD-10-CM | POA: Diagnosis present

## 2019-12-29 DIAGNOSIS — I214 Non-ST elevation (NSTEMI) myocardial infarction: Secondary | ICD-10-CM | POA: Diagnosis present

## 2019-12-29 DIAGNOSIS — R0600 Dyspnea, unspecified: Secondary | ICD-10-CM | POA: Diagnosis present

## 2019-12-29 DIAGNOSIS — I82402 Acute embolism and thrombosis of unspecified deep veins of left lower extremity: Secondary | ICD-10-CM

## 2019-12-29 DIAGNOSIS — I2721 Secondary pulmonary arterial hypertension: Secondary | ICD-10-CM | POA: Diagnosis present

## 2019-12-29 DIAGNOSIS — D696 Thrombocytopenia, unspecified: Secondary | ICD-10-CM | POA: Diagnosis present

## 2019-12-29 DIAGNOSIS — J9601 Acute respiratory failure with hypoxia: Secondary | ICD-10-CM

## 2019-12-29 DIAGNOSIS — I749 Embolism and thrombosis of unspecified artery: Secondary | ICD-10-CM | POA: Diagnosis not present

## 2019-12-29 DIAGNOSIS — I2602 Saddle embolus of pulmonary artery with acute cor pulmonale: Secondary | ICD-10-CM | POA: Diagnosis present

## 2019-12-29 DIAGNOSIS — R7303 Prediabetes: Secondary | ICD-10-CM | POA: Diagnosis present

## 2019-12-29 DIAGNOSIS — I081 Rheumatic disorders of both mitral and tricuspid valves: Secondary | ICD-10-CM | POA: Diagnosis present

## 2019-12-29 DIAGNOSIS — F17290 Nicotine dependence, other tobacco product, uncomplicated: Secondary | ICD-10-CM | POA: Diagnosis present

## 2019-12-29 DIAGNOSIS — I5021 Acute systolic (congestive) heart failure: Secondary | ICD-10-CM | POA: Diagnosis not present

## 2019-12-29 DIAGNOSIS — N179 Acute kidney failure, unspecified: Secondary | ICD-10-CM | POA: Diagnosis present

## 2019-12-29 DIAGNOSIS — I519 Heart disease, unspecified: Secondary | ICD-10-CM | POA: Diagnosis not present

## 2019-12-29 DIAGNOSIS — Q2112 Patent foramen ovale: Secondary | ICD-10-CM

## 2019-12-29 DIAGNOSIS — I340 Nonrheumatic mitral (valve) insufficiency: Secondary | ICD-10-CM | POA: Diagnosis not present

## 2019-12-29 DIAGNOSIS — J432 Centrilobular emphysema: Secondary | ICD-10-CM | POA: Diagnosis not present

## 2019-12-29 DIAGNOSIS — I5043 Acute on chronic combined systolic (congestive) and diastolic (congestive) heart failure: Secondary | ICD-10-CM | POA: Diagnosis present

## 2019-12-29 DIAGNOSIS — R7989 Other specified abnormal findings of blood chemistry: Secondary | ICD-10-CM

## 2019-12-29 DIAGNOSIS — J449 Chronic obstructive pulmonary disease, unspecified: Secondary | ICD-10-CM | POA: Diagnosis present

## 2019-12-29 HISTORY — DX: Other pulmonary embolism without acute cor pulmonale: I26.99

## 2019-12-29 LAB — POCT I-STAT 7, (LYTES, BLD GAS, ICA,H+H)
Acid-base deficit: 4 mmol/L — ABNORMAL HIGH (ref 0.0–2.0)
Bicarbonate: 19.4 mmol/L — ABNORMAL LOW (ref 20.0–28.0)
Calcium, Ion: 1.22 mmol/L (ref 1.15–1.40)
HCT: 44 % (ref 39.0–52.0)
Hemoglobin: 15 g/dL (ref 13.0–17.0)
O2 Saturation: 89 %
Patient temperature: 97.6
Potassium: 5.1 mmol/L (ref 3.5–5.1)
Sodium: 139 mmol/L (ref 135–145)
TCO2: 20 mmol/L — ABNORMAL LOW (ref 22–32)
pCO2 arterial: 30.9 mmHg — ABNORMAL LOW (ref 32.0–48.0)
pH, Arterial: 7.403 (ref 7.350–7.450)
pO2, Arterial: 54 mmHg — ABNORMAL LOW (ref 83.0–108.0)

## 2019-12-29 LAB — CBC WITH DIFFERENTIAL/PLATELET
Abs Immature Granulocytes: 0.34 10*3/uL — ABNORMAL HIGH (ref 0.00–0.07)
Basophils Absolute: 0.1 10*3/uL (ref 0.0–0.1)
Basophils Relative: 0 %
Eosinophils Absolute: 0 10*3/uL (ref 0.0–0.5)
Eosinophils Relative: 0 %
HCT: 43.3 % (ref 39.0–52.0)
Hemoglobin: 14.6 g/dL (ref 13.0–17.0)
Immature Granulocytes: 2 %
Lymphocytes Relative: 12 %
Lymphs Abs: 2.6 10*3/uL (ref 0.7–4.0)
MCH: 28.4 pg (ref 26.0–34.0)
MCHC: 33.7 g/dL (ref 30.0–36.0)
MCV: 84.2 fL (ref 80.0–100.0)
Monocytes Absolute: 1.6 10*3/uL — ABNORMAL HIGH (ref 0.1–1.0)
Monocytes Relative: 7 %
Neutro Abs: 17.5 10*3/uL — ABNORMAL HIGH (ref 1.7–7.7)
Neutrophils Relative %: 79 %
Platelets: 132 10*3/uL — ABNORMAL LOW (ref 150–400)
RBC: 5.14 MIL/uL (ref 4.22–5.81)
RDW: 16.5 % — ABNORMAL HIGH (ref 11.5–15.5)
WBC: 22.1 10*3/uL — ABNORMAL HIGH (ref 4.0–10.5)
nRBC: 8.1 % — ABNORMAL HIGH (ref 0.0–0.2)

## 2019-12-29 LAB — COMPREHENSIVE METABOLIC PANEL
ALT: 143 U/L — ABNORMAL HIGH (ref 0–44)
AST: 120 U/L — ABNORMAL HIGH (ref 15–41)
Albumin: 2.3 g/dL — ABNORMAL LOW (ref 3.5–5.0)
Alkaline Phosphatase: 132 U/L — ABNORMAL HIGH (ref 38–126)
Anion gap: 13 (ref 5–15)
BUN: 37 mg/dL — ABNORMAL HIGH (ref 6–20)
CO2: 19 mmol/L — ABNORMAL LOW (ref 22–32)
Calcium: 8.7 mg/dL — ABNORMAL LOW (ref 8.9–10.3)
Chloride: 110 mmol/L (ref 98–111)
Creatinine, Ser: 1.74 mg/dL — ABNORMAL HIGH (ref 0.61–1.24)
GFR calc Af Amer: 49 mL/min — ABNORMAL LOW (ref >60)
GFR calc non Af Amer: 42 mL/min — ABNORMAL LOW (ref >60)
Glucose, Bld: 160 mg/dL — ABNORMAL HIGH (ref 70–99)
Potassium: 5.4 mmol/L — ABNORMAL HIGH (ref 3.5–5.1)
Sodium: 142 mmol/L (ref 135–145)
Total Bilirubin: 1.6 mg/dL — ABNORMAL HIGH (ref 0.3–1.2)
Total Protein: 6.8 g/dL (ref 6.5–8.1)

## 2019-12-29 LAB — HEPARIN LEVEL (UNFRACTIONATED): Heparin Unfractionated: 0.55 IU/mL (ref 0.30–0.70)

## 2019-12-29 LAB — BRAIN NATRIURETIC PEPTIDE: B Natriuretic Peptide: 1086.8 pg/mL — ABNORMAL HIGH (ref 0.0–100.0)

## 2019-12-29 LAB — D-DIMER, QUANTITATIVE (NOT AT ARMC): D-Dimer, Quant: >20 ug/mL-FEU — ABNORMAL HIGH (ref 0.00–0.50)

## 2019-12-29 LAB — TROPONIN I (HIGH SENSITIVITY)
Troponin I (High Sensitivity): 4863 ng/L (ref <18)
Troponin I (High Sensitivity): 6642 ng/L (ref <18)

## 2019-12-29 LAB — LACTIC ACID, PLASMA: Lactic Acid, Venous: 6.2 mmol/L (ref 0.5–1.9)

## 2019-12-29 LAB — POC SARS CORONAVIRUS 2 AG -  ED: SARS Coronavirus 2 Ag: NEGATIVE

## 2019-12-29 MED ORDER — IPRATROPIUM-ALBUTEROL 0.5-2.5 (3) MG/3ML IN SOLN: 3.0000 mL | RESPIRATORY_TRACT | Status: DC | PRN

## 2019-12-29 MED ORDER — SODIUM CHLORIDE 0.9 % IV SOLN: 500.0000 mg | Freq: Once | INTRAVENOUS | Status: DC

## 2019-12-29 MED ORDER — HEPARIN BOLUS VIA INFUSION
5900.0000 [IU] | Freq: Once | INTRAVENOUS | Status: AC
  Administered 2019-12-29: 5900 [IU] via INTRAVENOUS
  Filled 2019-12-29: qty 5900

## 2019-12-29 MED ORDER — HEPARIN (PORCINE) 25000 UT/250ML-% IV SOLN
2000.0000 [IU]/h | INTRAVENOUS | Status: DC
  Administered 2019-12-29 – 2019-12-31 (×2): 1500 [IU]/h via INTRAVENOUS
  Administered 2020-01-01 (×2): 1900 [IU]/h via INTRAVENOUS
  Administered 2020-01-02: 2000 [IU]/h via INTRAVENOUS
  Filled 2019-12-29 (×7): qty 250

## 2019-12-29 MED ORDER — MORPHINE SULFATE (PF) 2 MG/ML IV SOLN
2.0000 mg | Freq: Once | INTRAVENOUS | Status: AC
  Administered 2019-12-29: 22:00:00 2 mg via INTRAVENOUS
  Filled 2019-12-29: qty 1

## 2019-12-29 MED ORDER — ONDANSETRON HCL 4 MG/2ML IJ SOLN
4.0000 mg | Freq: Once | INTRAMUSCULAR | Status: AC
  Administered 2019-12-29: 18:00:00 4 mg via INTRAVENOUS
  Filled 2019-12-29: qty 2

## 2019-12-29 MED ORDER — SODIUM CHLORIDE 0.9 % IV SOLN: 1.0000 g | Freq: Once | INTRAVENOUS | Status: DC

## 2019-12-29 MED ORDER — POLYETHYLENE GLYCOL 3350 17 G PO PACK
17.0000 g | PACK | Freq: Every day | ORAL | Status: DC | PRN
  Administered 2020-01-02: 17 g via ORAL
  Filled 2019-12-29: qty 1

## 2019-12-29 MED ORDER — IOHEXOL 350 MG/ML SOLN
100.0000 mL | Freq: Once | INTRAVENOUS | Status: AC | PRN
  Administered 2019-12-29: 100 mL via INTRAVENOUS

## 2019-12-29 MED ORDER — DOCUSATE SODIUM 100 MG PO CAPS
100.0000 mg | ORAL_CAPSULE | Freq: Two times a day (BID) | ORAL | Status: DC | PRN
  Administered 2020-01-02: 100 mg via ORAL
  Filled 2019-12-29: qty 1

## 2019-12-29 MED ORDER — HYDROMORPHONE HCL 1 MG/ML IJ SOLN
0.5000 mg | Freq: Once | INTRAMUSCULAR | Status: AC
  Administered 2019-12-29: 0.5 mg via INTRAVENOUS
  Filled 2019-12-29: qty 1

## 2019-12-29 NOTE — Progress Notes (Signed)
ANTICOAGULATION CONSULT NOTE  Pharmacy Consult for Heparin Indication: pulmonary embolus  No Known Allergies  Patient Measurements: Height: 6\' 1"  (185.4 cm) Weight: 84.3 kg (185 lb 14.4 oz) IBW/kg (Calculated) : 79.9 Heparin Dosing Weight: 84.3 kg  Vital Signs: Temp: 97.6 F (36.4 C) (04/24 1931) Temp Source: Oral (04/24 1931) BP: 121/97 (04/24 2145) Pulse Rate: 93 (04/24 1931)  Labs: Recent Labs    12/29/19 1633 12/29/19 2221  HGB 14.6  --   HCT 43.3  --   PLT 132*  --   HEPARINUNFRC  --  0.55  CREATININE 1.74*  --   TROPONINIHS 4,863* 6,642*    Estimated Creatinine Clearance: 51.7 mL/min (A) (by C-G formula based on SCr of 1.74 mg/dL (H)).  Assessment: 59 y.o. male with PE for heparin.   Goal of Therapy:  Heparin level 0.3-0.7 units/ml Monitor platelets by anticoagulation protocol: Yes   Plan:  Continue Heparin at current rate  Follow-up am labs.   Eliza Green, [place] PharmD. BCPS  12/29/2019,11:32 PM
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
## 2019-12-29 NOTE — ED Provider Notes (Signed)
[facility] EMERGENCY DEPARTMENT Provider Note   CSN: [id number] Arrival date & time: 12/29/19  1620     History Chief Complaint  Patient presents with  . Shortness of Breath    Cody Joyce is a 59 y.o. male.  Patient with history of tobacco and alcohol abuse, central lobar emphysema, pulmonary nodules --presents the emergency department with complaint of right-sided rib and chest pain as well as shortness of breath.  Patient sustained an injury on approximately 4/15 when he states a tree limb limb he was loading onto a truck impacted his right lower rib below his pectoral muscle.  He had significant pain subsequent to this.  Patient reports going to the [place] on 4/19 and had imaging done.  He was told that his ribs were just very bruised.  Patient has had progressive shortness of breath, especially with exertion.  He states that is taking a very long period of time to do basic ADLs.  His breathing continued to worsen today to the point where EMS was called.  Patient was found to have an oxygen saturation of 60% on room air which was improved into the low 90s on nonrebreather at 15 L.  Patient denies fever or cough.  He has been having trouble lying flat to sleep.  He denies any lower extremity edema.  No use of blood thinners or anticoagulation.        Past Medical History:  Diagnosis Date  . Emphysema of lung [facility])     Patient Active Problem List   Diagnosis Date Noted  . Aortic atherosclerosis ([place]) 08/09/2018  . COPD (chronic obstructive pulmonary disease) ([place]) 07/25/2018  . Alcohol dependence ([place]) 07/25/2018  . Mediastinal adenopathy 07/25/2018  . Multiple lung nodules on CT 07/25/2018  . 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 Osteoarthritis of left knee 05/30/2013    No past surgical history on file.     Family History  Problem Relation Age of Onset  . Glaucoma Mother   . Heart disease Father        stents/CAD/AMI age 78s.  . Glaucoma Father      Social History   Tobacco Use  . Smoking status: Light Tobacco Smoker  . Smokeless tobacco: Never Used  Substance Use Topics  . Alcohol use: Yes    Alcohol/week: 6.0 standard drinks    Types: 2 Glasses of wine, 4 Cans of beer per week  . Drug use: No    Home Medications Prior to Admission medications   Medication Sig Start Date End Date Taking? Authorizing Provider  omeprazole (PRILOSEC) 40 MG capsule Take 40 mg by mouth daily.    [provider]    Allergies    Patient has no known allergies.  Review of Systems   Review of Systems  Constitutional: Negative for fever.  HENT: Negative for rhinorrhea and sore throat.   Eyes: Negative for redness.  Respiratory: Positive for shortness of breath. Negative for cough.   Cardiovascular: Positive for chest pain. Negative for leg swelling.  Gastrointestinal: Positive for abdominal pain. Negative for diarrhea, nausea and vomiting.  Genitourinary: Negative for dysuria.  Musculoskeletal: Negative for myalgias.  Skin: Negative for rash.  Neurological: Negative for headaches.    Physical Exam Updated Vital Signs BP (!) 121/97   Pulse 100   Resp (!) 28   SpO2 98%   Physical Exam Vitals and nursing note reviewed.  Constitutional:      Appearance: He is well-developed.  HENT:  Head: Normocephalic and atraumatic.     Mouth/Throat:     Mouth: Mucous membranes are moist.  Eyes:     General:        Right eye: No discharge.        Left eye: No discharge.     Conjunctiva/sclera: Conjunctivae normal.  Cardiovascular:     Rate and Rhythm: Regular rhythm. Tachycardia present.     Heart sounds: Normal heart sounds.  Pulmonary:     Effort: Pulmonary effort is normal.     Breath sounds: Normal breath sounds. No decreased breath sounds, wheezing or rales.  Chest:     Chest wall: Tenderness (R anterior inferior ribs) present.  Abdominal:     Palpations: Abdomen is soft.     Tenderness: There is abdominal tenderness.  There is no guarding or rebound.     Comments: Right upper quadrant and epigastrium  Musculoskeletal:     Cervical back: Normal range of motion and neck supple.     Right lower leg: No tenderness. No edema.     Left lower leg: No tenderness. No edema.     Comments: No symptoms L thigh at time of exam. No clinical signs of DVT.   Skin:    General: Skin is warm and dry.  Neurological:     Mental Status: He is alert.     ED Results / Procedures / Treatments   Labs (all labs ordered are listed, but only abnormal results are displayed) Labs Reviewed  CBC WITH DIFFERENTIAL/PLATELET - Abnormal; Notable for the following components:      Result Value   WBC 22.1 (*)    RDW 16.5 (*)    Platelets 132 (*)    nRBC 8.1 (*)    Neutro Abs 17.5 (*)    Monocytes Absolute 1.6 (*)    Abs Immature Granulocytes 0.34 (*)    All other components within normal limits  COMPREHENSIVE METABOLIC PANEL - Abnormal; Notable for the following components:   Potassium 5.4 (*)    CO2 19 (*)    Glucose, Bld 160 (*)    BUN 37 (*)    Creatinine, Ser 1.74 (*)    Calcium 8.7 (*)    Albumin 2.3 (*)    AST 120 (*)    ALT 143 (*)    Alkaline Phosphatase 132 (*)    Total Bilirubin 1.6 (*)    GFR calc non Af Amer 42 (*)    GFR calc Af Amer 49 (*)    All other components within normal limits  D-DIMER, QUANTITATIVE (NOT AT [facility]) - Abnormal; Notable for the following components:   D-Dimer, Quant >20.00 (*)    All other components within normal limits  BRAIN NATRIURETIC PEPTIDE - Abnormal; Notable for the following components:   B Natriuretic Peptide 1,086.8 (*)    All other components within normal limits  LACTIC ACID, PLASMA - Abnormal; Notable for the following components:   Lactic Acid, Venous 6.2 (*)    All other components within normal limits  TROPONIN I (HIGH SENSITIVITY) - Abnormal; Notable for the following components:   Troponin I (High Sensitivity) 4,863 (*)    All other components within normal  limits  CULTURE, BLOOD (ROUTINE X 2)  CULTURE, BLOOD (ROUTINE X 2)  RESPIRATORY PANEL BY RT PCR (FLU A&B, COVID)  HEPARIN LEVEL (UNFRACTIONATED)  HEPARIN LEVEL (UNFRACTIONATED)  CBC  HIV ANTIBODY (ROUTINE TESTING W REFLEX)  URINALYSIS, COMPLETE (UACMP) WITH MICROSCOPIC  BASIC METABOLIC PANEL  MAGNESIUM  PHOSPHORUS  POC SARS CORONAVIRUS 2 AG -  ED  TROPONIN I (HIGH SENSITIVITY)    EKG EKG Interpretation  Date/Time:  Saturday December 29 2019 16:59:20 EDT Ventricular Rate:  99 PR Interval:    QRS Duration: 96 QT Interval:  374 QTC Calculation: 480 R Axis:   72 Text Interpretation: Sinus rhythm Probable left atrial enlargement RSR' in V1 or V2, probably normal variant Abnormal T, consider ischemia, anterior leads new anterior ischemia compared with prior 11/19 Confirmed by Aletta Edouard [phone]) on 12/29/2019 5:05:44 PM   Radiology DG Chest Port 1 View  Result Date: 12/29/2019 CLINICAL DATA:  Hypoxia shortness of breath. EXAM: PORTABLE CHEST 1 VIEW COMPARISON:  07/25/2018 FINDINGS: Globular appearance of cardiac silhouette with cardiac enlargement that is likely also accentuated by portable technique. Hilar structures are unremarkable. Patchy opacities in the RIGHT and LEFT mid chest. No dense consolidation. No sign of pleural effusion. Visualized skeletal structures are unremarkable. IMPRESSION: Patchy opacities in the RIGHT and LEFT mid chest, potentially related to developing infection, viral or atypical process. Given history of trauma on the side of trauma small area of contusion is also considered. Globular cardiac silhouette.  Pericardial effusion not excluded. Electronically Signed   By: Zetta Bills M.D.   On: 12/29/2019 17:32    Procedures Procedures (including critical care time)  Medications Ordered in ED Medications  heparin ADULT infusion 100 units/mL (25000 units/234mL sodium chloride 0.45%) (1,500 Units/hr Intravenous New Bag/Given 12/29/19 1937)  docusate sodium  (COLACE) capsule 100 mg (has no administration in time range)  polyethylene glycol (MIRALAX / GLYCOLAX) packet 17 g (has no administration in time range)  morphine 2 MG/ML injection 2 mg (has no administration in time range)  HYDROmorphone (DILAUDID) injection 0.5 mg (0.5 mg Intravenous Given 12/29/19 1750)  ondansetron (ZOFRAN) injection 4 mg (4 mg Intravenous Given 12/29/19 1747)  iohexol (OMNIPAQUE) 350 MG/ML injection 100 mL (100 mLs Intravenous Contrast Given 12/29/19 1836)  heparin bolus via infusion 5,900 Units (5,900 Units Intravenous Bolus from Bag 12/29/19 1938)    ED Course  I have reviewed the triage vital signs and the nursing notes.  Pertinent labs & imaging results that were available during my care of the patient were reviewed by me and considered in my medical decision making (see chart for details).  Patient seen at time of arrival at bedside with Dr. Melina Copa.  Bedside echocardiography performed.  Oxygen improved on nonrebreather mask.  Work-up initiated.   Vital signs reviewed and are as follows: BP (!) 121/97   Pulse 100   Resp (!) 28   SpO2 98%     EMERGENCY DEPARTMENT [place] CARDIAC EXAM "Study: Limited Ultrasound of the Heart and Pericardium"  INDICATIONS:Abnormal vital signs, Chest pain and Dyspnea Multiple views of the heart and pericardium were obtained in real-time with a multi-frequency probe.  PERFORMED [id number] IMAGES ARCHIVED?: Yes LIMITATIONS:  Emergent procedure VIEWS USED: Subcostal 4 chamber, Parasternal long axis, Parasternal short axis, Apical 4 chamber  and Inferior Vena Cava INTERPRETATION: Cardiac activity present, Pericardial effusioin absent, Decreased contractility and IVC dilated  6:04 PM patient remained stable.  Pain medication ordered.  Patient has some concern for pneumonia on chest x-ray, elevated white blood cell count, and hypoxia.  Given this we will start treatment for community-acquired pneumonia while we further delineate etiology of  his symptoms.  In addition, D-dimer just resulted at greater than 20.  Patient continues to await CT imaging of the chest to evaluate for PE and trauma.  6:23  PM patient with AKI.  I spoke with CT regarding obtaining imaging  6:34 PM Spoke with cardiology who will see shortly.   7:26 PM Dr. Melina Copa and myself spoke with radiology regarding findings earlier.  Heparin ordered.  Pharmacy has dosed heparin.  Patient and wife updated on results to this point.  On further questioning, the patient states that he injured his hamstring in his left thigh about 6 months ago.  He states that recently he has had some soreness without swelling in his left thigh.  He attributed this to his previous injury.  I have spoken with Dr. Gillermina Phy, PCCM, [facility]. Discussed patient. Sending bedside team to evaluate the patient.   BP (!) 127/108   Pulse (!) 48   Resp (!) 27   Ht 6\' 1"  (1.854 m)   Wt 84.3 kg   SpO2 91%   BMI 24.53 kg/m   8:09 PM Pt stable.   8:28 PM Recurrent pain. Additional medication ordered. PCCM and cards have seen.   CRITICAL CARE Performed by: Carlisle Cater PA-C Total critical care time: 75 minutes Critical care time was exclusive of separately billable procedures and treating other patients. Critical care was necessary to treat or prevent imminent or life-threatening deterioration. Critical care was time spent personally by me on the following activities: development of treatment plan with patient and/or surrogate as well as nursing, discussions with consultants, evaluation of patient's response to treatment, examination of patient, obtaining history from patient or surrogate, ordering and performing treatments and interventions, ordering and review of laboratory studies, ordering and review of radiographic studies, pulse oximetry and re-evaluation of patient's condition.    Clinical Course as of Dec 28 1924  Sat Dec 29, 6259  [id number] 59 year old male here with shortness of breath  right-sided chest pain after some seemingly minor trauma.  He is hypoxic here requiring nonrebreather.  Bedside echo did not show an obvious pericardial effusion but does not look like his EF was normal.  EKG abnormal with anterior T wave inversions.  Lactate came back abnormal at 6.2 and elevated troponin D-dimer and BNP.  CT showing large PEs also some evidence of left renal infarct.  Critical care consulted.  Heparin ordered.   [MB]    Clinical Course User Index [MB] Hayden Rasmussen, MD   MDM Rules/Calculators/A&P                      Admit.    Final Clinical Impression(s) / ED Diagnoses Final diagnoses:  Other acute pulmonary embolism with acute cor pulmonale ([place])  Acute respiratory failure with hypoxia ([place])  Acute kidney injury [facility])  Renal infarct [facility])  Pulmonary infarct [facility])    Rx / DC Orders ED Discharge Orders    None       Suann Larry 12/29/19 2031    Hayden Rasmussen, MD 12/29/19 2228

## 2019-12-29 NOTE — Consult Note (Signed)
Cardiology Consult    Patient ID: Cody Joyce MRN: [id number], DOB/AGE: [birth year]   Admit date: 12/29/2019 Date of Consult: 12/29/2019  Primary Physician: Patient, No Pcp Per Primary Cardiologist: No primary care provider on file. Requesting Provider: Carlisle Cater  Patient Profile    Cody Joyce is a 59 y.o. male with a history tobacco use, alcohol use disorder, and COPD who presented to the ED with worsening shortness of breath and chest pain.  We are consulted for troponin elevation.  History of Present Illness    This is a 59 year old male with history of tobacco abuse, alcohol use disorder, COPD who presented to ED with worsening shortness of breath and chest pain.  We were consulted for troponin elevation.  He reports that ~9 days ago he was working doing manual labor and had a large tree branch hit him in the chest.  Since then he's had severe chest pain that has largely prevented him from exerting himself.  Today he had worsening shortness of breath, fatigue, and chest pain, and decided to present to the emergency department for evaluation.  He denies clear exertional component to chest pain and describes constant dull pain exacerbated by taking a deep breath.  No orthopnea, PND, or lower extremity edema.  He has no prior cardiovascular history, though his father had premature CAD.  He does not admit to any modifiable risk factors aside from long-standing smoking  Workup in the emergency department consistent for troponin I 4863, BNP 1086, EKG with S1q3T3 and iRBBB, hypoxemia, and CTA demonstrating saddle pulmonary embolism and areas of pulmonary infarction.  Past Medical History   Past Medical History:  Diagnosis Date  . Emphysema of lung ([place])     No prior surgical history  Takes no medications.  No Known Allergies   Inpatient Medications   Heparin infusion morpine for chest pain Docusate PEG  Family History    Family History  Problem Relation Age of  Onset  . Glaucoma Mother   . Heart disease Father        stents/CAD/AMI age 70s.  . Glaucoma Father    He indicated that his mother is alive. He indicated that his father is alive. He indicated that his sister is alive. He indicated that his brother is alive.   Social History    Social History   Socioeconomic History  . Marital status: Married    Spouse name: Not on file  . Number of children: Not on file  . Years of education: Not on file  . Highest education level: Not on file  Occupational History  . Not on file  Tobacco Use  . Smoking status: Light Tobacco Smoker  . Smokeless tobacco: Never Used  Substance and Sexual Activity  . Alcohol use: Yes    Alcohol/week: 6.0 standard drinks    Types: 2 Glasses of wine, 4 Cans of beer per week  . Drug use: No  . Sexual activity: Yes  Other Topics Concern  . Not on file  Social History Narrative   Marital status:  Married x 3 years; not happily married.      Children:  None      Lives: alone.      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      Tobacco: smoke cigars 2 per day.  Quit cigarettes in 2001; smoked x 15 years.      Alcohol:  Weekends 4 beers per week; 2 drinks per week.  Drugs:  None      Exercising:  Three days per week; active job.        Seatbelt:  100%      Guns:  None      Sexual activity: condoms; Gonorrhea in high school.  One partner in past year.  Last STD screening 2013.   Social Determinants of Health   Financial Resource Strain:   . Difficulty of Paying Living Expenses:   Food Insecurity:   . Worried About Charity fundraiser in the Last Year:   . Arboriculturist in the Last Year:   Transportation Needs:   . Film/video editor (Medical):   [name] Lack of Transportation (Non-Medical):   Physical Activity:   . Days of Exercise per Week:   . Minutes of Exercise per Session:   Stress:   . Feeling of Stress :   Social Connections:   . Frequency of Communication with Friends  and Family:   . Frequency of Social Gatherings with Friends and Family:   . Attends Religious Services:   . Active Member of Clubs or Organizations:   . Attends Archivist Meetings:   [name] Marital Status:   Intimate Partner Violence:   . Fear of Current or Ex-Partner:   . Emotionally Abused:   [name] Physically Abused:   . Sexually Abused:      Review of Systems    General:  No chills, fever, night sweats or weight changes.  Cardiovascular: No orthopnea, palpitations, paroxysmal nocturnal dyspnea. Dermatological: No rash, lesions/masses Respiratory: No cough, dyspnea Urologic: No hematuria, dysuria Abdominal:   No nausea, vomiting, diarrhea, bright red blood per rectum, melena, or hematemesis Neurologic:  No visual changes, wkns, changes in mental status. All other systems reviewed and are otherwise negative except as noted above.  Physical Exam    Blood pressure (!) 138/101, pulse 93, temperature 97.6 F (36.4 C), temperature source Oral, resp. rate (!) 22, height 6\' 1"  (1.854 m), weight 84.3 kg, SpO2 92 %.    No intake or output data in the 24 hours ending 12/29/19 2126 Wt Readings from Last 3 Encounters:  12/29/19 84.3 kg  08/09/18 81.6 kg  07/26/18 79.6 kg    CONSTITUTIONAL: alert and conversant, well-appearing, nourished, no distress HEENT: oropharynx clear and moist, no mucosal lesions, normal dentition, conjunctiva normal, EOM intact, pupils equal, no lid lag. NECK: supple, trachea midline CARDIOVASCULAR: Regular rhythm. No gallop, murmur, or rub. Normal S1/S2. Radial pulses intact. JVP elevated 11 cm H20. No carotid bruits. PULMONARY/CHEST WALL: no deformities, diffuse crackles bilateral mid to lower lung fields, normal work of breathing ABDOMINAL: soft, non-tender, non-distended EXTREMITIES: no edema or muscle atrophy, warm and well-perfused SKIN: Dry and intact without apparent rashes or wounds.  NEUROLOGIC: alert, normal gait, no abnormal movements, cranial  nerves grossly intact.   Labs    TropI 4863, BNP 1986,  K 5.4, Cr 1.74 from 1.06 prior, AST 120, ALT 143, TBili 1.6 22.1>14.6<132 Lactate 6.2 Sars COV2 negative   Radiology Studies    CT images personally reviewed.  CT Angio Chest PE W and/or Wo Contrast  Result Date: 12/29/2019 CLINICAL DATA:  Short of breath, right-sided chest and rib pain, recent history of trauma EXAM: CT ANGIOGRAPHY CHEST WITH CONTRAST TECHNIQUE: Multidetector CT imaging of the chest was performed using the standard protocol during bolus administration of intravenous contrast. Multiplanar CT image reconstructions and MIPs were obtained to evaluate the vascular anatomy. CONTRAST:  13mL OMNIPAQUE IOHEXOL 350 MG/ML  SOLN COMPARISON:  None. FINDINGS: Cardiovascular: This is a technically adequate evaluation of the pulmonary vasculature. There are large bilateral pulmonary emboli filling the main pulmonary arteries. There is straightening of the interventricular septum and mild dilation of the right ventricle, with RV/LV ratio measuring 1.3. Along the nondependent surface of the right ventral, actually better visualized on the corresponding abdominal CT, there is likely a here mural thrombus. Echocardiography may be useful for further evaluation. No pericardial effusion. The thoracic aorta is normal in caliber without aneurysm or dissection. Mediastinum/Nodes: No enlarged mediastinal, hilar, or axillary lymph nodes. Thyroid gland, trachea, and esophagus demonstrate no significant findings. Lungs/Pleura: There is background emphysema. Bilateral areas of airspace disease are seen within the dependent upper lobes and superior segment of the bilateral lower lobes. Ground-glass opacities are seen within the dependent lower lobes. No effusion or pneumothorax. Central airways are patent. Upper Abdomen: No acute abnormality. Musculoskeletal: No acute or destructive bony lesions. Reconstructed images demonstrate no additional findings.  Review of the MIP images confirms the above findings. IMPRESSION: 1. Large bilateral pulmonary emboli with CT evidence of right heart strain (RV/LV Ratio = 1.3) consistent with at least submassive (intermediate risk) PE. The presence of right heart strain has been associated with an increased risk of morbidity and mortality. 2. Likely adherent mural thrombus along the anterior wall the right ventricle, better visualized on the corresponding abdominal CT. 3. Bilateral areas of consolidation and ground-glass airspace disease, superimposed upon background emphysema. Findings favor pulmonary edema with developing pulmonary infarctions. 4.  Emphysema (ICD10-J43.9). These results were called by telephone at the time of interpretation on 12/29/2019 at 7:07 pm to provider Dr. Melina Copa, who verbally acknowledged these results. Electronically Signed   By: Randa Ngo M.D.   On: 12/29/2019 19:08   CT ABDOMEN PELVIS W CONTRAST  Result Date: 12/29/2019 CLINICAL DATA:  History of abdominal trauma, shortness of breath, right upper quadrant pain EXAM: CT ABDOMEN AND PELVIS WITH CONTRAST TECHNIQUE: Multidetector CT imaging of the abdomen and pelvis was performed using the standard protocol following bolus administration of intravenous contrast. CONTRAST:  133mL OMNIPAQUE IOHEXOL 350 MG/ML SOLN COMPARISON:  None. FINDINGS: Lower chest: Large bilateral pulmonary emboli are seen. Diffuse ground-glass opacities throughout the lung bases. Please refer to corresponding CT chest report for important discussion of these findings. Hepatobiliary: No hepatic injury or perihepatic hematoma. Gallbladder is unremarkable Pancreas: Unremarkable. No pancreatic ductal dilatation or surrounding inflammatory changes. Spleen: No splenic injury or perisplenic hematoma. Adrenals/Urinary Tract: There are multiple wedge-shaped hypodensities within the left kidney consistent with renal infarcts. There is no surrounding fluid or perinephric fat stranding  to suggest posttraumatic etiology. The right kidney enhances normally. The adrenals are unremarkable. Bladder is grossly normal. Stomach/Bowel: No bowel obstruction or ileus. Normal appendix right lower quadrant. No bowel wall thickening or inflammatory change. Vascular/Lymphatic: Minimal atherosclerosis of the abdominal aorta. The visualized vascular structures opacify normally. No pathologic adenopathy. Reproductive: Prostate is unremarkable. Other: No free fluid or free gas. No abdominal wall hernia. No evidence of soft tissue injury related to recent trauma. Musculoskeletal: No acute or destructive bony lesions. Reconstructed images demonstrate no additional findings. IMPRESSION: 1. Multiple left renal cortical infarcts. 2. Large bilateral pulmonary emboli. Please refer to corresponding CT chest report for important discussion of these findings. 3. No evidence of intra-abdominal or intrapelvic trauma. 4. Aortic Atherosclerosis (ICD10-I70.0). These results were called by telephone at the time of interpretation on 12/29/2019 at 7:03 pm to provider Dr. Melina Copa, who verbally acknowledged these results. Electronically  Signed   By: Randa Ngo M.D.   On: 12/29/2019 19:08   DG Chest Port 1 View  Result Date: 12/29/2019 CLINICAL DATA:  Hypoxia shortness of breath. EXAM: PORTABLE CHEST 1 VIEW COMPARISON:  07/25/2018 FINDINGS: Globular appearance of cardiac silhouette with cardiac enlargement that is likely also accentuated by portable technique. Hilar structures are unremarkable. Patchy opacities in the RIGHT and LEFT mid chest. No dense consolidation. No sign of pleural effusion. Visualized skeletal structures are unremarkable. IMPRESSION: Patchy opacities in the RIGHT and LEFT mid chest, potentially related to developing infection, viral or atypical process. Given history of trauma on the side of trauma small area of contusion is also considered. Globular cardiac silhouette.  Pericardial effusion not excluded.  Electronically Signed   By: Zetta Bills M.D.   On: 12/29/2019 17:32    ECG & Cardiac Imaging    ECG reviewed.  Normal sinus rhythm with borderline RAD, borderline predominant S in 1, q wave in III, TWI in III, and iRBBB all suggestive of right heart strain.  Assessment & Plan    This is a 59 year old male smoker without other significant past medical history on whom we were consulted regarding chest pain and troponin elevation.  Objective findings are significant for large burden of pulmonary emboli by CT angiography, troponin elevation, BNP elevation, RAP elevation on exam, and EKG consistent with right heart strain.  His lactate is elevated @ 6.2 with mild transaminitis and TBili elevation.  Ancillary CT findings include bilateral pulmonary edema and question of mural thrombus in the RV.  Overall presentation is consistent with obstructive shock due to large pulmonary thromboembolic burden.  His troponin, BNP, and EKG are attributable to right heart strain and this can be included in risk stratification of his pulmonary embolism.  Recommend best care per our pulmonary/ICU admitting service for his pulmonary embolism en light of these findings.  His bilateral pulmonary infiltrates with worsening hypoxemia since presentation are worrisome for incipient ARDS.  Problem list Bilateral pulmonary emboli Obstructive shock Acute hypoxemic respiratory failure Bilateral pulmonary infiltrates ?ARDS Right ventricular strain Acute renal failure Hyperbilirubinemia Transaminitis Thrombocytopenia (suspect consumptive) Troponin elevation BNP elevation   Recommendations - Obtain non-emergent echocardiogram to evaluate RV strain, thrombus.  This is for stratification and serial examination as he has clear evidence of RV strain clinically. - Agree with anticoagulation per ICU service and consideration of lytics or catheter-directed thrombolysis.  Signed, Delight Hoh, MD 12/29/2019, 9:26 PM  For  questions or updates, please contact   Please consult [email or web address] for contact info under Cardiology/STEMI.

## 2019-12-29 NOTE — H&P (Addendum)
NAME:  Cody Joyce, MRN:  [id number], DOB:  September 18, 1960, LOS: 0 ADMISSION DATE:  12/29/2019, CONSULTATION DATE:  12/29/19 REFERRING MD:  EDP, CHIEF COMPLAINT:  dyspnea   Brief History   59 y.o. M with PMH of tobacco use, alcohol use and emphysema who had a tree branch fall on his right rib-cage on 4/15. He had a CXR which showed no fracture, but since then has not been moving much due to pain.  Found to have submassive PE, PCCM consulted for admission.  History of present illness   Cody Joyce is a 59 year old male with past medical history of tobacco and alcohol use and emphysema who presented with chest pain and hypoxia.  He was loading something on a truck and a branch fell into his right chest approximately 10 days ago.  He had severe rib cage pain after this, he went to the [place] and had a chest x-ray which was negative for fracture.  Since then he has been minimally ambulatory secondary to pain.  Denies significant leg pain or swelling.  in the last day his breathing became much harder and EMS was called.  He was initially 60% on room air which improved with 15 L nonrebreather.  In the emergency department, heart rate was initially bradycardic then 90-100s, blood pressure stable, oxygen saturations 8898% on 15 L nonrebreather.  Labs were significant for troponin of 4863, BNP 1086, lactic acid 6.  CT scan was significant for large bilateral pulmonary emboli with CT evidence of right heart strain and an adherent mural thrombus along the anterior wall of the RV with bilateral groundglass opacities likely developing pulmonary infarctions.  On exam, patient is awake and alert he is not in respiratory distress and remains hemodynamically stable.  No hemoptysis or current chest pain.  Decision made not to pursue systemic lytics, PCCM to admit with IR consult for catheter directed thrombolytics versus mechanical thrombectomy.  Past Medical History   has a past medical history of Emphysema of lung ([place]).   Significant Hospital Events   12/29/19 admit to PCCM  Consults:  Cardiology IR  Procedures:    Significant Diagnostic Tests:  12/29/19 CT chest>> bilateral PE with CT evidence of right heart strain and mural thrombus along the anterior wall of the RV, bilateral groundglass disease with underlying emphysema 12/29/19 CT abdomen pelvis>> multiple left renal cortical infarcts, no evidence of intra abdominal or pelvic trauma  Micro Data:  12/29/19 POC Covid-19 negative 12/29/19 BCx2>>negative  Antimicrobials:     Interim history/subjective:  Spoke with interventional radiology, patient is a good candidate for mechanical suction/thrombectomy as can be done in the morning, if shows signs of worsening hemodynamic compromise overnight then IR will come in emergently for EKOS.  Objective   Blood pressure (!) 138/101, pulse 93, temperature 97.6 F (36.4 C), temperature source Oral, resp. rate (!) 22, height 6\' 1"  (1.854 m), weight 84.3 kg, SpO2 92 %.       No intake or output data in the 24 hours ending 12/29/19 2020 Filed Weights   12/29/19 1900  Weight: 84.3 kg   General: Well-nourished, well-appearing male in no acute distress HEENT: MM pink/moist Neuro: Awake and alert, oriented x4, following commands CV: s1s2 RRR, no m/r/g PULM: Decreased air movement bilateral bases, no wheezing or rhonchi, no accessory muscle use, mild tachypnea GI: soft, bsx4 active  Extremities: warm/dry, no edema  Skin: no rashes or lesions   Resolved Hospital Problem list     Assessment & Plan:  Submassive PE with evidence of right heart strain and hypoxic respiratory failure -Patient is currently stable on nonrebreather without hypotension -Elevated troponin and BNP and lactic acid  -PESI score 89, intermediate risk P: -As he is hemodynamically stable, the decision was made not to pursue systemic lytics -Admit to critical care, continue heparin drip -Spoke with Dr. Laurence Ferrari on-call for  IR, plan for mechanical suction/thrombectomy in the morning, if worsens overnight then will come in for emergent EKOS -Echocardiogram -Trend troponin and lactic acid -Monitor blood pressure and respiratory status closely    Acute kidney injury with left renal cortical infarct -Creatinine 1.7 above baseline normal -Infarcts likely secondary to emboli -K 5.4 P: -Follow renal function, electrolytes and urine output -Avoid nephrotoxins    Elevated transaminases, EtOH abuse -Suspect secondary to long-term alcohol use, reported 6 drinks per week, no history of withdrawal during past admission  P: -Repeat LFTs in the morning   Hyperglycemia -160s in the ED P: -Sliding scale insulin and A1c    Emphysema -Patient states he stopped smoking cigars 1 week ago, no wheezing on exam P: -As needed duo nebs    Best practice:  Diet: N.p.o. Pain/Anxiety/Delirium protocol (if indicated): As needed oxycodone/morphine VAP protocol (if indicated): N/A DVT prophylaxis: Heparin GI prophylaxis: N/A Glucose control: SSI Mobility: Bedrest Code Status: Full code Family Communication: Wife updated at the bedside Disposition: ICU  Labs   CBC: Recent Labs  Lab 12/29/19 1633  WBC 22.1*  NEUTROABS 17.5*  HGB 14.6  HCT 43.3  MCV 84.2  PLT 132*    Basic Metabolic Panel: Recent Labs  Lab 12/29/19 1633  NA 142  K 5.4*  CL 110  CO2 19*  GLUCOSE 160*  BUN 37*  CREATININE 1.74*  CALCIUM 8.7*   GFR: Estimated Creatinine Clearance: 51.7 mL/min (A) (by C-G formula based on SCr of 1.74 mg/dL (H)). Recent Labs  Lab 12/29/19 1633  WBC 22.1*  LATICACIDVEN 6.2*    Liver Function Tests: Recent Labs  Lab 12/29/19 1633  AST 120*  ALT 143*  ALKPHOS 132*  BILITOT 1.6*  PROT 6.8  ALBUMIN 2.3*   No results for input(s): LIPASE, AMYLASE in the last 168 hours. No results for input(s): AMMONIA in the last 168 hours.  ABG No results found for: PHART, PCO2ART, PO2ART, HCO3, TCO2,  ACIDBASEDEF, O2SAT   Coagulation Profile: No results for input(s): INR, PROTIME in the last 168 hours.  Cardiac Enzymes: No results for input(s): CKTOTAL, CKMB, CKMBINDEX, TROPONINI in the last 168 hours.  HbA1C: Hemoglobin A1C  Date/Time Value Ref Range Status  01/12/2013 05:14 PM 5.7  Final    CBG: No results for input(s): GLUCAP in the last 168 hours.  Review of Systems:   Negative except as noted in HPI  Past Medical History  He,  has a past medical history of Emphysema of lung ([place]).   Surgical History   No past surgical history on file.   Social History   reports that he has been smoking. He has never used smokeless tobacco. He reports current alcohol use of about 6.0 standard drinks of alcohol per week. He reports that he does not use drugs.   Family History   His family history includes Glaucoma in his father and mother; Heart disease in his father.   Allergies No Known Allergies   Home Medications  Prior to Admission medications   Medication Sig Start Date End Date Taking? Authorizing Provider  omeprazole (PRILOSEC) 40 MG capsule Take 40 mg by  mouth daily.    [provider]     Critical care time: 62 minutes    CRITICAL CARE Performed by: Otilio Carpen Gleason   Total critical care time: 62 minutes  Critical care time was exclusive of separately billable procedures and treating other patients.  Critical care was necessary to treat or prevent imminent or life-threatening deterioration.  Critical care was time spent personally by me on the following activities: development of treatment plan with patient and/or surrogate as well as nursing, discussions with consultants, evaluation of patient's response to treatment, examination of patient, obtaining history from patient or surrogate, ordering and performing treatments and interventions, ordering and review of laboratory studies, ordering and review of radiographic studies, pulse oximetry and  re-evaluation of patient's condition.  Otilio Carpen Gleason, PA-C  I saw and evaluated the patient, performing the key elements of the service. I discussed the findings, assessment and plan with Mickel Baas Gleason, PA-C and agree with the documentation attached.     Jeanene Erb, MD Board Certified by the ABIM, [place] Pager: [phone]

## 2019-12-29 NOTE — Progress Notes (Signed)
ANTICOAGULATION CONSULT NOTE  Pharmacy Consult for Heparin Indication: pulmonary embolus  No Known Allergies  Patient Measurements: Height: 6\' 1"  (185.4 cm) Weight: 84.3 kg (185 lb 14.4 oz) IBW/kg (Calculated) : 79.9 Heparin Dosing Weight: 84.3 kg  Vital Signs: BP: 127/108 (04/24 1745) Pulse Rate: 48 (04/24 1745)  Labs: Recent Labs    12/29/19 1633  HGB 14.6  HCT 43.3  PLT 132*  CREATININE 1.74*  TROPONINIHS 4,863*    Estimated Creatinine Clearance: 51.7 mL/min (A) (by C-G formula based on SCr of 1.74 mg/dL (H)).   Medical History: Past Medical History:  Diagnosis Date  . Emphysema of lung ([facility])     Medications:  Scheduled:    Assessment: Patient is a 89 yom that presented to the ED with c/o SOB. The patient was found to have a PE and pharmacy has been asked to doe heparin at this time.   Goal of Therapy:  Heparin level 0.3-0.7 units/ml Monitor platelets by anticoagulation protocol: Yes   Plan:  - Heparin bolus 5900 units  IV x 1 dose - Heparin drip @ 1500 units/hr - Heparin level in ~ 6 hours  - Monitor patient for s/s of bleeding and CBC while on heparin   Duanne Limerick PharmD. BCPS  12/29/2019,7:01 PM

## 2019-12-29 NOTE — ED Notes (Signed)
SARS Coronavirus 2 Ag "NEGATIVE" reported to Dr. Melina Copa

## 2019-12-30 ENCOUNTER — Inpatient Hospital Stay (HOSPITAL_COMMUNITY): Payer: No Typology Code available for payment source

## 2019-12-30 DIAGNOSIS — I2609 Other pulmonary embolism with acute cor pulmonale: Secondary | ICD-10-CM | POA: Diagnosis present

## 2019-12-30 DIAGNOSIS — I2602 Saddle embolus of pulmonary artery with acute cor pulmonale: Secondary | ICD-10-CM | POA: Diagnosis not present

## 2019-12-30 HISTORY — PX: IR ANGIOGRAM PULMONARY BILATERAL SELECTIVE: IMG664

## 2019-12-30 HISTORY — PX: IR THROMBECT PRIM MECH INIT (INCLU) MOD SED: IMG2297

## 2019-12-30 HISTORY — DX: Other pulmonary embolism with acute cor pulmonale: I26.09

## 2019-12-30 HISTORY — PX: IR ANGIOGRAM SELECTIVE EACH ADDITIONAL VESSEL: IMG667

## 2019-12-30 HISTORY — PX: IR THROMBECT PRIM MECH ADD (INCLU) MOD SED: IMG2298

## 2019-12-30 HISTORY — PX: IR US GUIDE VASC ACCESS RIGHT: IMG2390

## 2019-12-30 LAB — COMPREHENSIVE METABOLIC PANEL
ALT: 145 U/L — ABNORMAL HIGH (ref 0–44)
AST: 109 U/L — ABNORMAL HIGH (ref 15–41)
Albumin: 2.1 g/dL — ABNORMAL LOW (ref 3.5–5.0)
Alkaline Phosphatase: 113 U/L (ref 38–126)
Anion gap: 11 (ref 5–15)
BUN: 43 mg/dL — ABNORMAL HIGH (ref 6–20)
CO2: 21 mmol/L — ABNORMAL LOW (ref 22–32)
Calcium: 8.5 mg/dL — ABNORMAL LOW (ref 8.9–10.3)
Chloride: 111 mmol/L (ref 98–111)
Creatinine, Ser: 1.96 mg/dL — ABNORMAL HIGH (ref 0.61–1.24)
GFR calc Af Amer: 42 mL/min — ABNORMAL LOW (ref >60)
GFR calc non Af Amer: 36 mL/min — ABNORMAL LOW (ref >60)
Glucose, Bld: 129 mg/dL — ABNORMAL HIGH (ref 70–99)
Potassium: 5.5 mmol/L — ABNORMAL HIGH (ref 3.5–5.1)
Sodium: 143 mmol/L (ref 135–145)
Total Bilirubin: 1.3 mg/dL — ABNORMAL HIGH (ref 0.3–1.2)
Total Protein: 6.8 g/dL (ref 6.5–8.1)

## 2019-12-30 LAB — URINALYSIS, COMPLETE (UACMP) WITH MICROSCOPIC
Bacteria, UA: NONE SEEN
Bilirubin Urine: NEGATIVE
Glucose, UA: NEGATIVE mg/dL
Ketones, ur: NEGATIVE mg/dL
Leukocytes,Ua: NEGATIVE
Nitrite: NEGATIVE
Protein, ur: 30 mg/dL — AB
Specific Gravity, Urine: 1.036 — ABNORMAL HIGH (ref 1.005–1.030)
pH: 5 (ref 5.0–8.0)

## 2019-12-30 LAB — PROTIME-INR
INR: 1.6 — ABNORMAL HIGH (ref 0.8–1.2)
Prothrombin Time: 18.8 seconds — ABNORMAL HIGH (ref 11.4–15.2)

## 2019-12-30 LAB — POCT ACTIVATED CLOTTING TIME
Activated Clotting Time: 197 seconds
Activated Clotting Time: 246 seconds

## 2019-12-30 LAB — ECHOCARDIOGRAM COMPLETE
Height: 73 in
Weight: 2974.4 oz

## 2019-12-30 LAB — CBC
HCT: 41.2 % (ref 39.0–52.0)
Hemoglobin: 14.1 g/dL (ref 13.0–17.0)
MCH: 28.2 pg (ref 26.0–34.0)
MCHC: 34.2 g/dL (ref 30.0–36.0)
MCV: 82.4 fL (ref 80.0–100.0)
Platelets: 120 10*3/uL — ABNORMAL LOW (ref 150–400)
RBC: 5 MIL/uL (ref 4.22–5.81)
RDW: 16.2 % — ABNORMAL HIGH (ref 11.5–15.5)
WBC: 20.8 10*3/uL — ABNORMAL HIGH (ref 4.0–10.5)
nRBC: 12.5 % — ABNORMAL HIGH (ref 0.0–0.2)

## 2019-12-30 LAB — HEMOGLOBIN A1C
Hgb A1c MFr Bld: 6.4 % — ABNORMAL HIGH (ref 4.8–5.6)
Mean Plasma Glucose: 136.98 mg/dL

## 2019-12-30 LAB — RESPIRATORY PANEL BY RT PCR (FLU A&B, COVID)
Influenza A by PCR: NEGATIVE
Influenza B by PCR: NEGATIVE
SARS Coronavirus 2 by RT PCR: NEGATIVE

## 2019-12-30 LAB — LACTIC ACID, PLASMA: Lactic Acid, Venous: 2.2 mmol/L (ref 0.5–1.9)

## 2019-12-30 LAB — PHOSPHORUS: Phosphorus: 6.1 mg/dL — ABNORMAL HIGH (ref 2.5–4.6)

## 2019-12-30 LAB — HEPARIN LEVEL (UNFRACTIONATED): Heparin Unfractionated: 0.31 IU/mL (ref 0.30–0.70)

## 2019-12-30 LAB — MAGNESIUM: Magnesium: 2.6 mg/dL — ABNORMAL HIGH (ref 1.7–2.4)

## 2019-12-30 LAB — HIV ANTIBODY (ROUTINE TESTING W REFLEX): HIV Screen 4th Generation wRfx: NONREACTIVE

## 2019-12-30 MED ORDER — FENTANYL CITRATE (PF) 100 MCG/2ML IJ SOLN
INTRAMUSCULAR | Status: AC
  Filled 2019-12-30: qty 4

## 2019-12-30 MED ORDER — HYDROMORPHONE HCL 1 MG/ML IJ SOLN
0.5000 mg | Freq: Once | INTRAMUSCULAR | Status: AC
  Administered 2019-12-30: 0.5 mg via INTRAVENOUS
  Filled 2019-12-30: qty 1

## 2019-12-30 MED ORDER — PANTOPRAZOLE SODIUM 40 MG PO TBEC
40.0000 mg | DELAYED_RELEASE_TABLET | Freq: Every day | ORAL | Status: DC
  Administered 2019-12-31 – 2020-01-11 (×11): 40 mg via ORAL
  Filled 2019-12-30 (×12): qty 1

## 2019-12-30 MED ORDER — HEPARIN SODIUM (PORCINE) 1000 UNIT/ML IJ SOLN
INTRAMUSCULAR | Status: AC
  Filled 2019-12-30: qty 1

## 2019-12-30 MED ORDER — MIDAZOLAM HCL 2 MG/2ML IJ SOLN
INTRAMUSCULAR | Status: AC
  Filled 2019-12-30: qty 6

## 2019-12-30 MED ORDER — IOHEXOL 300 MG/ML  SOLN
150.0000 mL | Freq: Once | INTRAMUSCULAR | Status: AC | PRN
  Administered 2019-12-30: 80 mL via INTRA_ARTERIAL

## 2019-12-30 MED ORDER — PERFLUTREN LIPID MICROSPHERE
1.0000 mL | INTRAVENOUS | Status: AC | PRN
  Administered 2019-12-30: 2 mL via INTRAVENOUS
  Filled 2019-12-30: qty 10

## 2019-12-30 MED ORDER — LIDOCAINE HCL 1 % IJ SOLN
INTRAMUSCULAR | Status: AC
  Filled 2019-12-30: qty 20

## 2019-12-30 MED ORDER — LIDOCAINE HCL 1 % IJ SOLN
INTRAMUSCULAR | Status: AC | PRN
  Administered 2019-12-30: 10 mL

## 2019-12-30 MED ORDER — IOHEXOL 300 MG/ML  SOLN
150.0000 mL | Freq: Once | INTRAMUSCULAR | Status: AC | PRN
  Administered 2019-12-30: 100 mL via INTRA_ARTERIAL

## 2019-12-30 MED ORDER — FENTANYL CITRATE (PF) 100 MCG/2ML IJ SOLN
INTRAMUSCULAR | Status: AC | PRN
  Administered 2019-12-30 (×4): 25 ug via INTRAVENOUS

## 2019-12-30 MED ORDER — HEPARIN SODIUM (PORCINE) 1000 UNIT/ML IJ SOLN
INTRAMUSCULAR | Status: AC | PRN
  Administered 2019-12-30: 2000 [IU] via INTRAVENOUS
  Administered 2019-12-30: 3000 [IU] via INTRAVENOUS
  Administered 2019-12-30: 5000 [IU] via INTRAVENOUS

## 2019-12-30 MED ORDER — IOHEXOL 300 MG/ML  SOLN
50.0000 mL | Freq: Once | INTRAMUSCULAR | Status: AC | PRN
  Administered 2019-12-30: 20 mL via INTRA_ARTERIAL

## 2019-12-30 MED ORDER — MIDAZOLAM HCL 2 MG/2ML IJ SOLN
INTRAMUSCULAR | Status: AC | PRN
  Administered 2019-12-30 (×4): 0.5 mg via INTRAVENOUS

## 2019-12-30 NOTE — Sedation Documentation (Signed)
SBAR called to [place], Therapist, sports.

## 2019-12-30 NOTE — Progress Notes (Signed)
PCCM interval progress:  Pt re-rounded on and remains stable and arousable.  Oxygen sats 88-90 on 15L non-rebreather.  Pt is comfortable with the plan for likely thrombectomy this morning.   Otilio Carpen Brylinn Teaney, PA-C

## 2019-12-30 NOTE — Progress Notes (Signed)
  Echocardiogram 2D Echocardiogram has been performed.  Merrie Roof F 12/30/2019, 2:18 PM

## 2019-12-30 NOTE — Consult Note (Addendum)
Chief Complaint: Patient was seen in consultation today for pulmonary arteriogram with utilization of Inari thrombectomy device/clot retrieval as well as possible thrombolytic therapy of pulmonary emboli Chief Complaint  Patient presents with  . Shortness of Breath    Referring Physician(s): Eltarabouisi,W  Supervising Physician: Jacqulynn Cadet  Patient Status: [facility] - ED  History of Present Illness: Cody Joyce is a 59 y.o. male smoker with history of alcohol use and emphysema ,status post tree branch fall on his right rib cage on 4/15.  Chest x-ray revealed no acute fracture but patient has had chest discomfort in that region since and has also become progressively more dyspneic.  He presented to the [facility] ED yesterday evening with the above symptoms.  Subsequent imaging revealed:  1. Large bilateral pulmonary emboli with CT evidence of right heart strain (RV/LV Ratio = 1.3) consistent with at least submassive (intermediate risk) PE. The presence of right heart strain has been associated with an increased risk of morbidity and mortality. 2. Likely adherent mural thrombus along the anterior wall the right ventricle, better visualized on the corresponding abdominal CT. 3. Bilateral areas of consolidation and ground-glass airspace disease, superimposed upon background emphysema. Findings favor pulmonary edema with developing pulmonary infarctions. 4.  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  He is on IV heparin.  He is afebrile, latest BP 138/100; he is on a nonrebreather mask with sats at 91%.  WBC 20.8, hemoglobin 14.1, platelets120k, potassium 5.5, creatinine 1.96, lactic acid 2.2, troponin I markedly elevated, D-dimer greater than 20.  No lower extremity venous Dopplers available.  Patient was evaluated by critical care service and request now received for pulmonary thrombectomy utilizing Inari device/clot retrieval as  well as possible thrombolytic therapy of pulmonary emboli.  He has no reported history of cancer, recent surgeries, stroke or bleeding diathesis.  Past Medical History:  Diagnosis Date  . Emphysema of lung ([place])     No past surgical history on file.  Allergies: Patient has no known allergies.  Medications: Prior to Admission medications   Medication Sig Start Date End Date Taking? Authorizing Provider  acetaminophen (TYLENOL) 500 MG tablet Take 1,000 mg by mouth every 6 (six) hours as needed for headache (pain).   Yes [provider]  albuterol (VENTOLIN HFA) 108 (90 Base) MCG/ACT inhaler Inhale 2 puffs into the lungs 4 (four) times daily.   Yes [provider]  diclofenac Sodium (VOLTAREN) 1 % GEL Apply 1 application topically 3 (three) times daily as needed (knee pain).   Yes [provider]  LIDOCAINE EX Apply 1 application topically daily as needed (pain).   Yes [provider]  Liniments (BLUE-EMU SUPER STRENGTH) CREA Apply 1 application topically daily as needed (pain).   Yes [provider]  Multiple Vitamin (MULTIVITAMIN WITH MINERALS) TABS tablet Take 1 tablet by mouth daily.   Yes [provider]  omeprazole (PRILOSEC) 20 MG capsule Take 20 mg by mouth 2 (two) times daily before a meal.   Yes [provider]     Family History  Problem Relation Age of Onset  . Glaucoma Mother   . Heart disease Father        stents/CAD/AMI age 25s.  . Glaucoma Father     Social History   Socioeconomic History  . Marital status: Married    Spouse name: Not on file  . Number of children: Not on file  . Years of education: Not  on file  . Highest education level: Not on file  Occupational History  . Not on file  Tobacco Use  . Smoking status: Light Tobacco Smoker  . Smokeless tobacco: Never Used  Substance and Sexual Activity  . Alcohol use: Yes    Alcohol/week: 6.0 standard drinks    Types: 2 Glasses of wine, 4 Cans  of beer per week  . Drug use: No  . Sexual activity: Yes  Other Topics Concern  . Not on file  Social History Narrative   Marital status:  Married x 3 years; not happily married.      Children:  None      Lives: alone.      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      Tobacco: smoke cigars 2 per day.  Quit cigarettes in 2001; smoked x 15 years.      Alcohol:  Weekends 4 beers per week; 2 drinks per week.      Drugs:  None      Exercising:  Three days per week; active job.        Seatbelt:  100%      Guns:  None      Sexual activity: condoms; Gonorrhea in high school.  One partner in past year.  Last STD screening 2013.   Social Determinants of Health   Financial Resource Strain:   . Difficulty of Paying Living Expenses:   Food Insecurity:   . Worried About Charity fundraiser in the Last Year:   . Arboriculturist in the Last Year:   Transportation Needs:   . Film/video editor (Medical):   [name] Lack of Transportation (Non-Medical):   Physical Activity:   . Days of Exercise per Week:   . Minutes of Exercise per Session:   Stress:   . Feeling of Stress :   Social Connections:   . Frequency of Communication with Friends and Family:   . Frequency of Social Gatherings with Friends and Family:   . Attends Religious Services:   . Active Member of Clubs or Organizations:   . Attends Archivist Meetings:   [name] Marital Status:       Review of Systems see above: Currently denies fever, headache, nausea, vomiting or bleeding.  Vital Signs: BP (!) 138/100   Pulse 91   Temp 97.9 F (36.6 C) (Axillary)   Resp 14   Ht 6\' 1"  (1.854 m)   Wt 185 lb 14.4 oz (84.3 kg)   SpO2 91%   BMI 24.53 kg/m   Physical Exam awake, alert.  Chest with distant breath sounds bilaterally.  Heart with regular rate and rhythm.  Abdomen soft, positive bowel sounds, currently nontender.  No lower extremity edema.  Imaging: CT Angio Chest PE W and/or Wo Contrast  Result  Date: 12/29/2019 CLINICAL DATA:  Short of breath, right-sided chest and rib pain, recent history of trauma EXAM: CT ANGIOGRAPHY CHEST WITH CONTRAST TECHNIQUE: Multidetector CT imaging of the chest was performed using the standard protocol during bolus administration of intravenous contrast. Multiplanar CT image reconstructions and MIPs were obtained to evaluate the vascular anatomy. CONTRAST:  157mL OMNIPAQUE IOHEXOL 350 MG/ML SOLN COMPARISON:  None. FINDINGS: Cardiovascular: This is a technically adequate evaluation of the pulmonary vasculature. There are large bilateral pulmonary emboli filling the main pulmonary arteries. There is straightening of the interventricular septum and mild dilation of the right ventricle, with RV/LV ratio measuring 1.3. Along the nondependent surface  of the right ventral, actually better visualized on the corresponding abdominal CT, there is likely a here mural thrombus. Echocardiography may be useful for further evaluation. No pericardial effusion. The thoracic aorta is normal in caliber without aneurysm or dissection. Mediastinum/Nodes: No enlarged mediastinal, hilar, or axillary lymph nodes. Thyroid gland, trachea, and esophagus demonstrate no significant findings. Lungs/Pleura: There is background emphysema. Bilateral areas of airspace disease are seen within the dependent upper lobes and superior segment of the bilateral lower lobes. Ground-glass opacities are seen within the dependent lower lobes. No effusion or pneumothorax. Central airways are patent. Upper Abdomen: No acute abnormality. Musculoskeletal: No acute or destructive bony lesions. Reconstructed images demonstrate no additional findings. Review of the MIP images confirms the above findings. IMPRESSION: 1. Large bilateral pulmonary emboli with CT evidence of right heart strain (RV/LV Ratio = 1.3) consistent with at least submassive (intermediate risk) PE. The presence of right heart strain has been associated with an  increased risk of morbidity and mortality. 2. Likely adherent mural thrombus along the anterior wall the right ventricle, better visualized on the corresponding abdominal CT. 3. Bilateral areas of consolidation and ground-glass airspace disease, superimposed upon background emphysema. Findings favor pulmonary edema with developing pulmonary infarctions. 4.  Emphysema (ICD10-J43.9). These results were called by telephone at the time of interpretation on 12/29/2019 at 7:07 pm to provider Dr. Melina Copa, who verbally acknowledged these results. Electronically Signed   By: Randa Ngo M.D.   On: 12/29/2019 19:08   CT ABDOMEN PELVIS W CONTRAST  Result Date: 12/29/2019 CLINICAL DATA:  History of abdominal trauma, shortness of breath, right upper quadrant pain EXAM: CT ABDOMEN AND PELVIS WITH CONTRAST TECHNIQUE: Multidetector CT imaging of the abdomen and pelvis was performed using the standard protocol following bolus administration of intravenous contrast. CONTRAST:  125mL OMNIPAQUE IOHEXOL 350 MG/ML SOLN COMPARISON:  None. FINDINGS: Lower chest: Large bilateral pulmonary emboli are seen. Diffuse ground-glass opacities throughout the lung bases. Please refer to corresponding CT chest report for important discussion of these findings. Hepatobiliary: No hepatic injury or perihepatic hematoma. Gallbladder is unremarkable Pancreas: Unremarkable. No pancreatic ductal dilatation or surrounding inflammatory changes. Spleen: No splenic injury or perisplenic hematoma. Adrenals/Urinary Tract: There are multiple wedge-shaped hypodensities within the left kidney consistent with renal infarcts. There is no surrounding fluid or perinephric fat stranding to suggest posttraumatic etiology. The right kidney enhances normally. The adrenals are unremarkable. Bladder is grossly normal. Stomach/Bowel: No bowel obstruction or ileus. Normal appendix right lower quadrant. No bowel wall thickening or inflammatory change. Vascular/Lymphatic:  Minimal atherosclerosis of the abdominal aorta. The visualized vascular structures opacify normally. No pathologic adenopathy. Reproductive: Prostate is unremarkable. Other: No free fluid or free gas. No abdominal wall hernia. No evidence of soft tissue injury related to recent trauma. Musculoskeletal: No acute or destructive bony lesions. Reconstructed images demonstrate no additional findings. IMPRESSION: 1. Multiple left renal cortical infarcts. 2. Large bilateral pulmonary emboli. Please refer to corresponding CT chest report for important discussion of these findings. 3. No evidence of intra-abdominal or intrapelvic trauma. 4. Aortic Atherosclerosis (ICD10-I70.0). These results were called by telephone at the time of interpretation on 12/29/2019 at 7:03 pm to provider Dr. Melina Copa, who verbally acknowledged these results. Electronically Signed   By: Randa Ngo M.D.   On: 12/29/2019 19:08   DG Chest Port 1 View  Result Date: 12/29/2019 CLINICAL DATA:  Hypoxia shortness of breath. EXAM: PORTABLE CHEST 1 VIEW COMPARISON:  07/25/2018 FINDINGS: Globular appearance of cardiac silhouette with cardiac enlargement that is  likely also accentuated by portable technique. Hilar structures are unremarkable. Patchy opacities in the RIGHT and LEFT mid chest. No dense consolidation. No sign of pleural effusion. Visualized skeletal structures are unremarkable. IMPRESSION: Patchy opacities in the RIGHT and LEFT mid chest, potentially related to developing infection, viral or atypical process. Given history of trauma on the side of trauma small area of contusion is also considered. Globular cardiac silhouette.  Pericardial effusion not excluded. Electronically Signed   By: Zetta Bills M.D.   On: 12/29/2019 17:32    Labs:  CBC: Recent Labs    12/29/19 1633 12/29/19 2333 12/30/19 0620  WBC 22.1*  --  20.8*  HGB 14.6 15.0 14.1  HCT 43.3 44.0 41.2  PLT 132*  --  120*    COAGS: No results for input(s): INR,  APTT in the last 8760 hours.  BMP: Recent Labs    12/29/19 1633 12/29/19 2333 12/30/19 0620  NA 142 139 143  K 5.4* 5.1 5.5*  CL 110  --  111  CO2 19*  --  21*  GLUCOSE 160*  --  129*  BUN 37*  --  43*  CALCIUM 8.7*  --  8.5*  CREATININE 1.74*  --  1.96*  GFRNONAA 42*  --  36*  GFRAA 49*  --  42*    LIVER FUNCTION TESTS: Recent Labs    12/29/19 1633 12/30/19 0620  BILITOT 1.6* 1.3*  AST 120* 109*  ALT 143* 145*  ALKPHOS 132* 113  PROT 6.8 6.8  ALBUMIN 2.3* 2.1*    TUMOR MARKERS: No results for input(s): AFPTM, CEA, CA199, CHROMGRNA in the last 8760 hours.  Assessment and Plan: 59 y.o. male smoker with history of alcohol use and emphysema, status post tree branch fall on his right rib cage on 4/15.  Chest x-ray revealed no acute fracture but patient has had chest discomfort in that region since and has also become progressively more dyspneic.  He presented to the [facility] ED yesterday evening with the above symptoms.  Subsequent imaging revealed:  1. Large bilateral pulmonary emboli with CT evidence of right heart strain (RV/LV Ratio = 1.3) consistent with at least submassive (intermediate risk) PE. The presence of right heart strain has been associated with an increased risk of morbidity and mortality. 2. Likely adherent mural thrombus along the anterior wall the right ventricle, better visualized on the corresponding abdominal CT. 3. Bilateral areas of consolidation and ground-glass airspace disease, superimposed upon background emphysema. Findings favor pulmonary edema with developing pulmonary infarctions. 4.  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  He is on IV heparin.  He is afebrile, latest BP 138/100; he is on a nonrebreather mask with sats at 91%.  WBC 20.8, hemoglobin 14.1, platelets120k, potassium 5.5, creatinine 1.96, lactic acid 2.2, troponin I markedly elevated, D-dimer greater than 20.  No lower  extremity venous Dopplers available.  Patient was evaluated by critical care service and request now received for pulmonary thrombectomy utilizing Inari device/clot retrieval as well as possible thrombolytic therapy of pulmonary emboli. He has no reported history of cancer, recent surgeries, stroke or bleeding diathesis.  Case and imaging studies have been reviewed by Dr. Laurence Ferrari.  Details/risks of above procedures, including but not limited to, internal bleeding, infection, injury to adjacent structures, inability to completely eradicate thrombus discussed with patient with his understanding and consent.  Procedure scheduled for today.  If thrombolytic therapy is administered patient will require transfer to ICU  for duration of therapy.   Thank you for this interesting consult.  I greatly enjoyed meeting GARET LIZ and look forward to participating in their care.  A copy of this report was sent to the requesting provider on this date.  Electronically Signed: D. Rowe Robert, PA-C 12/30/2019, 9:10 AM   I spent a total of  40 minutes   in face to face in clinical consultation, greater than 50% of which was counseling/coordinating care for pulmonary arteriogram with utilization of Inari thrombectomy device/clot retrieval as well as possible thrombolytic therapy of pulmonary emboli

## 2019-12-30 NOTE — ED Notes (Signed)
Pt o2 sating in 80s to low 90s. ABG completed and PO2 reported 57. MD notified. Will continue to monitor.

## 2019-12-30 NOTE — Progress Notes (Signed)
NAME:  Cody Joyce, MRN:  [id number], DOB:  08/09/1961, LOS: 1 ADMISSION DATE:  12/29/2019, CONSULTATION DATE:  12/29/2019 REFERRING MD:  EDP, CHIEF COMPLAINT:  Dyspnea   Brief History   59 y.o. M with PMH of tobacco use, alcohol use and emphysema who had a tree branch fall on his right rib-cage on 4/15. Cody Joyce had a CXR which showed no fracture, but since then has not been moving much due to pain.  Found to have submassive PE, PCCM consulted for admission.  History of present illness   Cody Joyce is a 60 year old male with past medical history of tobacco and alcohol use and emphysema who presented with chest pain and hypoxia.  Cody Joyce was loading something on a truck and a branch fell into his right chest approximately 10 days ago.  Cody Joyce had severe rib cage pain after this, Cody Joyce went to the [place] and had a chest x-ray which was negative for fracture.  Since then Cody Joyce has been minimally ambulatory secondary to pain.  Denies significant leg pain or swelling.  in the last day his breathing became much harder and EMS was called.  Cody Joyce was initially 60% on room air which improved with 15 L nonrebreather.  In the emergency department, heart rate was initially bradycardic then 90-100s, blood pressure stable, oxygen saturations 88-98% on 15 L nonrebreather.  Labs were significant for troponin of 4863, BNP 1086, lactic acid 6.  CT scan was significant for large bilateral pulmonary emboli with CT evidence of right heart strain and an adherent mural thrombus along the anterior wall of the RV with bilateral groundglass opacities likely developing pulmonary infarctions.  On exam, patient is awake and alert Cody Joyce is not in respiratory distress and remains hemodynamically stable.  No hemoptysis or current chest pain.  Decision made not to pursue systemic lytics, PCCM to admit with IR consult for catheter directed thrombolytics versus mechanical thrombectomy.  Past Medical History  Emphysema of lung ([place]).  Significant Hospital  Events   12/29/19 admit to [facility] 12/30/2019 Mechanical thrombectomy with IR  Consults:  IR, PCCM, Cardiology  Procedures:  12/30/2019 Mechanical thrombectomy with IR  Significant Diagnostic Tests:  12/29/19 CT chest>> bilateral PE with CT evidence of right heart strain and mural thrombus along the anterior wall of the RV, bilateral groundglass disease with underlying emphysema  12/29/19 CT abdomen pelvis>> multiple left renal cortical infarcts, no evidence of intra abdominal or pelvic trauma  12/30/19 TTE >> 1. Left ventricular ejection fraction, by estimation, is 35 to 40%. The  left ventricle has moderately decreased function. The left ventricle  demonstrates global hypokinesis. Left ventricular diastolic parameters are  consistent with Grade I diastolic  dysfunction (impaired relaxation). There is the interventricular septum is  flattened in systole and diastole, consistent with right ventricular  pressure and volume overload.  2. Right ventricular systolic function is moderately reduced.  3. Right atrial size was moderately dilated.  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 Trivial mitral valve regurgitation.  5. Unable to obtain adequate Doppler signal to estimate pulmonary  pressures. Tricuspid valve regurgitation is trivial.   Micro Data:  4/24: COVID negative  2/24 Blood cultures >> NGTD  Antimicrobials:  None  Interim history/subjective:  Has gone for mechanical thrombectomy. Feeling better. Hungry/thirsty. Having some back pain.   Objective   Blood pressure (!) 136/95, pulse 88, temperature 97.9 F (36.6 C), temperature source Axillary, resp. rate 19, height 6\' 1"  (1.854 m), weight 84.3 kg, SpO2 95 %.  No intake or output data in the 24 hours ending 12/30/19 1539 Filed Weights   12/29/19 1900  Weight: 84.3 kg   Examination: General: Well nourished male in no acute distress HENT: Normocephalic, atraumatic, moist mucus membranes Pulm: Good air movement  with no wheezing or crackles  CV: RRR, no murmurs, no rubs  Abdomen: Active bowel sounds, soft, non-distended, no tenderness to palpation  Extremities: Pulses palpable in all extremities, no LE edema  Skin: Warm and dry  Neuro: Alert and oriented x 3  Resolved Hospital Problem list   None  Assessment & Plan:   Submassive PE with evidence of right heart strain and hypoxic respiratory failure - Presented with submassive PE and evidence of right heart strain.  - S/p mechanical thrombectomy with IR - On heparin for anticoagulation  - Currently hemodynamically stable. Will transfer to progressive  - Follow-up LE dopplers.  - Will need bubble study to look for PFO given renal infarction. Given high pretest probability please inject bubble contrast from LE peripheral IV  Acute kidney injury with left renal cortical infarct - Creatinine 1.7 above baseline normal - UA consistent with renal infarction  - Will need bubble study to look for PFO (please inject bubble contrast from LE peripheral IV) - Continue to monitor    Elevated transaminases, EtOH abuse - Congestion given reflux of IV contrast on CT vs alcohol use - Continue to monitor  - Place on CIWA precautions   Hyperglycemia - CBG goal < 180 - SSI   Emphysema - PRN duo nebs  Best practice:  Diet: Regular Pain/Anxiety/Delirium protocol (if indicated): PRN Dilaudid  VAP protocol (if indicated): Not indicated  DVT prophylaxis: Full dose heparin for PE GI prophylaxis: None Glucose control: SSI Mobility: Bed rest  Code Status: Full Family Communication: Spoke with spouse at bedside Disposition: Progressive, [facility] to assume care on 12/31/19  Labs   CBC: Recent Labs  Lab 12/29/19 1633 12/29/19 2333 12/30/19 0620  WBC 22.1*  --  20.8*  NEUTROABS 17.5*  --   --   HGB 14.6 15.0 14.1  HCT 43.3 44.0 41.2  MCV 84.2  --  82.4  PLT 132*  --  120*    Basic Metabolic Panel: Recent Labs  Lab 12/29/19 1633  12/29/19 2333 12/30/19 0620  NA 142 139 143  K 5.4* 5.1 5.5*  CL 110  --  111  CO2 19*  --  21*  GLUCOSE 160*  --  129*  BUN 37*  --  43*  CREATININE 1.74*  --  1.96*  CALCIUM 8.7*  --  8.5*  MG  --   --  2.6*  PHOS  --   --  6.1*   GFR: Estimated Creatinine Clearance: 45.9 mL/min (A) (by C-G formula based on SCr of 1.96 mg/dL (H)). Recent Labs  Lab 12/29/19 1633 12/30/19 0620 12/30/19 0623  WBC 22.1* 20.8*  --   LATICACIDVEN 6.2*  --  2.2*    Liver Function Tests: Recent Labs  Lab 12/29/19 1633 12/30/19 0620  AST 120* 109*  ALT 143* 145*  ALKPHOS 132* 113  BILITOT 1.6* 1.3*  PROT 6.8 6.8  ALBUMIN 2.3* 2.1*   No results for input(s): LIPASE, AMYLASE in the last 168 hours. No results for input(s): AMMONIA in the last 168 hours.  ABG    Component Value Date/Time   PHART 7.403 12/29/2019 2333   PCO2ART 30.9 (L) 12/29/2019 2333   PO2ART 54 (L) 12/29/2019 2333   HCO3 19.4 (L)  12/29/2019 2333   TCO2 20 (L) 12/29/2019 2333   ACIDBASEDEF 4.0 (H) 12/29/2019 2333   O2SAT 89.0 12/29/2019 2333     Coagulation Profile: No results for input(s): INR, PROTIME in the last 168 hours.  Cardiac Enzymes: No results for input(s): CKTOTAL, CKMB, CKMBINDEX, TROPONINI in the last 168 hours.  HbA1C: Hemoglobin A1C  Date/Time Value Ref Range Status  01/12/2013 05:14 PM 5.7  Final   Hgb A1c MFr Bld  Date/Time Value Ref Range Status  12/29/2019 10:39 PM 6.4 (H) 4.8 - 5.6 % Final    Comment:    (NOTE) Pre diabetes:          5.7%-6.4% Diabetes:              >6.4% Glycemic control for   <7.0% adults with diabetes     CBG: No results for input(s): GLUCAP in the last 168 hours.  Review of Systems:   12 point ROS preformed. All negative aside from those mentioned in the HPI.  Past Medical History  Cody Joyce,  has a past medical history of Emphysema of lung ([place]).   Surgical History   No past surgical history on file.   Social History   reports that Cody Joyce has been smoking.  Cody Joyce has never used smokeless tobacco. Cody Joyce reports current alcohol use of about 6.0 standard drinks of alcohol per week. Cody Joyce reports that Cody Joyce does not use drugs.   Family History   His family history includes Glaucoma in his father and mother; Heart disease in his father.   Allergies No Known Allergies   Home Medications  Prior to Admission medications   Medication Sig Start Date End Date Taking? Authorizing Provider  acetaminophen (TYLENOL) 500 MG tablet Take 1,000 mg by mouth every 6 (six) hours as needed for headache (pain).   Yes [provider]  albuterol (VENTOLIN HFA) 108 (90 Base) MCG/ACT inhaler Inhale 2 puffs into the lungs 4 (four) times daily.   Yes [provider]  diclofenac Sodium (VOLTAREN) 1 % GEL Apply 1 application topically 3 (three) times daily as needed (knee pain).   Yes [provider]  LIDOCAINE EX Apply 1 application topically daily as needed (pain).   Yes [provider]  Liniments (BLUE-EMU SUPER STRENGTH) CREA Apply 1 application topically daily as needed (pain).   Yes [provider]  Multiple Vitamin (MULTIVITAMIN WITH MINERALS) TABS tablet Take 1 tablet by mouth daily.   Yes [provider]  omeprazole (PRILOSEC) 20 MG capsule Take 20 mg by mouth 2 (two) times daily before a meal.   Yes [provider]        Ina Homes, MD IMTS PGY3  Pager: [phone]

## 2019-12-30 NOTE — Sedation Documentation (Signed)
Gauze/tegaderm bandage applied to RIGHT femoral vein puncture site. Groin level 0, drsg CDI.

## 2019-12-30 NOTE — ED Notes (Signed)
Per MD going to try to titrate pt down on O2. We are going to try high-flow nasal cannula.

## 2019-12-30 NOTE — Sedation Documentation (Signed)
PAP 47/17 (28)

## 2019-12-30 NOTE — Sedation Documentation (Signed)
24 Fr sheath removed from RIGHT femoral vein by Dr. Laurence Ferrari. Hemostasis achieved with suture and quick clot. Groin level 0. Manual pressure applied by [facility], RTR X10mins.

## 2019-12-30 NOTE — ED Notes (Signed)
PT TAKEN TO IR

## 2019-12-30 NOTE — Sedation Documentation (Signed)
PAP 37/14 (24)

## 2019-12-30 NOTE — Procedures (Addendum)
Interventional Radiology Procedure Note  Procedure: Pulmonary angiogram with bilateral pulmonary artery suction thrombectomy.   Complications: None  Estimated Blood Loss: 500 mL  Recommendations: - Bedrest with leg straight x 2 hrs - May elevate HOB 20-30 degrees - ADAT - Recommend ECHO bubble study to evaluate for L-->R shunt        Signed,  Criselda Peaches, MD

## 2019-12-31 ENCOUNTER — Inpatient Hospital Stay (HOSPITAL_COMMUNITY): Payer: No Typology Code available for payment source

## 2019-12-31 DIAGNOSIS — I2609 Other pulmonary embolism with acute cor pulmonale: Secondary | ICD-10-CM | POA: Diagnosis not present

## 2019-12-31 DIAGNOSIS — I749 Embolism and thrombosis of unspecified artery: Secondary | ICD-10-CM

## 2019-12-31 DIAGNOSIS — I519 Heart disease, unspecified: Secondary | ICD-10-CM | POA: Diagnosis not present

## 2019-12-31 DIAGNOSIS — Q212 Atrioventricular septal defect: Secondary | ICD-10-CM

## 2019-12-31 DIAGNOSIS — I82412 Acute embolism and thrombosis of left femoral vein: Secondary | ICD-10-CM

## 2019-12-31 DIAGNOSIS — I2699 Other pulmonary embolism without acute cor pulmonale: Secondary | ICD-10-CM

## 2019-12-31 LAB — HEPARIN LEVEL (UNFRACTIONATED)
Heparin Unfractionated: 0.2 IU/mL — ABNORMAL LOW (ref 0.30–0.70)
Heparin Unfractionated: 0.19 IU/mL — ABNORMAL LOW (ref 0.30–0.70)
Heparin Unfractionated: 0.35 IU/mL (ref 0.30–0.70)

## 2019-12-31 LAB — CBC
HCT: 38.1 % — ABNORMAL LOW (ref 39.0–52.0)
Hemoglobin: 12.9 g/dL — ABNORMAL LOW (ref 13.0–17.0)
MCH: 28.4 pg (ref 26.0–34.0)
MCHC: 33.9 g/dL (ref 30.0–36.0)
MCV: 83.7 fL (ref 80.0–100.0)
Platelets: 147 10*3/uL — ABNORMAL LOW (ref 150–400)
RBC: 4.55 MIL/uL (ref 4.22–5.81)
RDW: 16.4 % — ABNORMAL HIGH (ref 11.5–15.5)
WBC: 21.7 10*3/uL — ABNORMAL HIGH (ref 4.0–10.5)
nRBC: 21.8 % — ABNORMAL HIGH (ref 0.0–0.2)

## 2019-12-31 LAB — BASIC METABOLIC PANEL
Anion gap: 10 (ref 5–15)
BUN: 49 mg/dL — ABNORMAL HIGH (ref 6–20)
CO2: 20 mmol/L — ABNORMAL LOW (ref 22–32)
Calcium: 7.9 mg/dL — ABNORMAL LOW (ref 8.9–10.3)
Chloride: 111 mmol/L (ref 98–111)
Creatinine, Ser: 1.82 mg/dL — ABNORMAL HIGH (ref 0.61–1.24)
GFR calc Af Amer: 46 mL/min — ABNORMAL LOW (ref >60)
GFR calc non Af Amer: 40 mL/min — ABNORMAL LOW (ref >60)
Glucose, Bld: 143 mg/dL — ABNORMAL HIGH (ref 70–99)
Potassium: 4.7 mmol/L (ref 3.5–5.1)
Sodium: 141 mmol/L (ref 135–145)

## 2019-12-31 LAB — MAGNESIUM: Magnesium: 2.5 mg/dL — ABNORMAL HIGH (ref 1.7–2.4)

## 2019-12-31 LAB — TROPONIN I (HIGH SENSITIVITY): Troponin I (High Sensitivity): 3000 ng/L (ref <18)

## 2019-12-31 LAB — HEMOGLOBIN A1C
Hgb A1c MFr Bld: 6.3 % — ABNORMAL HIGH (ref 4.8–5.6)
Mean Plasma Glucose: 134.11 mg/dL

## 2019-12-31 LAB — PHOSPHORUS: Phosphorus: 3.6 mg/dL (ref 2.5–4.6)

## 2019-12-31 MED ORDER — ALUM & MAG HYDROXIDE-SIMETH 200-200-20 MG/5ML PO SUSP
30.0000 mL | Freq: Once | ORAL | Status: AC
  Administered 2019-12-31: 30 mL via ORAL
  Filled 2019-12-31: qty 30

## 2019-12-31 MED ORDER — HEPARIN BOLUS VIA INFUSION
1200.0000 [IU] | Freq: Once | INTRAVENOUS | Status: AC
  Administered 2019-12-31: 1200 [IU] via INTRAVENOUS
  Filled 2019-12-31: qty 1200

## 2019-12-31 MED ORDER — METOPROLOL TARTRATE 5 MG/5ML IV SOLN
2.5000 mg | INTRAVENOUS | Status: DC | PRN
  Administered 2019-12-31 – 2020-01-01 (×2): 2.5 mg via INTRAVENOUS
  Filled 2019-12-31 (×2): qty 5

## 2019-12-31 MED ORDER — LIDOCAINE VISCOUS HCL 2 % MT SOLN
15.0000 mL | Freq: Once | OROMUCOSAL | Status: AC
  Administered 2019-12-31: 15 mL via ORAL
  Filled 2019-12-31: qty 15

## 2019-12-31 NOTE — Progress Notes (Signed)
  Echocardiogram 2D Echocardiogram has been performed.  Matilde Bash 12/31/2019, 11:35 AM

## 2019-12-31 NOTE — H&P (View-Only) (Signed)
Cardiology Consultation:   Patient ID: PIERCEN EHRSAM MRN: [id number]; DOB: 06-16-1961  Admit date: 12/29/2019 Date of Consult: 12/31/2019  Primary Care Provider: Patient, No Pcp Per Primary Cardiologist: No primary care provider on file. NEW Primary Electrophysiologist:  None    Patient Profile:   Cody Joyce is a 59 y.o. male with a hx of smoking and alcohol use, but no major chronic illness, admitted with submassive pulmonary embolism who is being seen today for the evaluation of CHF at the request of Dr. Pietro Cassis.  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  Approximately 10 days prior to admission he was loading a truck with tree limbs and 1 of these fell on his chest.  After that he had severe rib cage pain and shortness of breath.  He did not have cough or hemoptysis and denies leg swelling or tenderness.  On presentation he had severe hypoxemia, elevated troponin and BNP and lactic acidosis.  Large bilateral pulmonary emboli and evidence of right heart strain as well as mural thrombus along the anterior wall of the right ventricle were noted on CT.  He was also noted to have acute kidney injury with a creatinine of 1.7 and left renal cortical infarct.  He underwent (interventional radiology) suction thrombectomy.    An echocardiogram performed yesterday shows dilated right heart chambers as well as moderate reduction in right ventricular function, which might be expected with right heart pressure overload, but there was also evidence of right ventricular volume over load.  A saline contrast study performed today shows "negative contrast" in the right atrium consistent with a large left to right shunt across the atrial septum, but also moderate intermittent right to left shunt with saline  bubbles in the left heart.  His electrocardiogram shows sinus rhythm with biatrial enlargement, S1Q3T3 pattern, RSR' pattern in V1-V2 and T wave inversion in leads V2-V4 (which might all be explained by pulmonary embolism).  All these abnormalities are new when compared with ECGs from 2014 and 2019.  Lower extremity venous Doppler confirms deep vein thrombosis from the left common femoral to the left popliteal vein.  He is doing better and denies problems with dyspnea at rest.  He has not had chest pain with physical activity in the past or now.  He claims excellent functional status until the acute illness.  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  He has no trouble climbing stairs at a rapid pace.  He can "work harder than his 12 year old stepson".  His father had coronary disease in his 53s.  He checks his blood pressure at home and is usually 120/80.  He has never been diagnosed with diabetes, but hemoglobin A1c during this admission has been 6.3-6.4%.  We do not have a lipid profile.  Until a week ago he was smoking cigars.  He bears a diagnosis of emphysema.  He drinks a sixpack of beer a day.  He does not have a history of alcohol dependency or withdrawal.  There is no evidence of coronary atherosclerosis on CT of the chest and there was "minimal atherosclerosis" of the abdominal aorta on CT of the abdomen   Past Medical History:  Diagnosis Date   Emphysema of lung [facility])     Past Surgical History:  Procedure Laterality Date  IR ANGIOGRAM PULMONARY BILATERAL SELECTIVE  12/30/2019   IR ANGIOGRAM SELECTIVE EACH ADDITIONAL VESSEL  12/30/2019   IR ANGIOGRAM SELECTIVE EACH ADDITIONAL VESSEL  12/30/2019   IR THROMBECT PRIM MECH ADD (INCLU) MOD SED  12/30/2019   IR THROMBECT PRIM MECH INIT (INCLU) MOD SED  12/30/2019   IR US GUIDE VASC ACCESS RIGHT  12/30/2019     Home Medications:  Prior to Admission medications   Medication Sig Start Date End Date Taking?  Authorizing Provider  acetaminophen (TYLENOL) 500 MG tablet Take 1,000 mg by mouth every 6 (six) hours as needed for headache (pain).   Yes [provider]  albuterol (VENTOLIN HFA) 108 (90 Base) MCG/ACT inhaler Inhale 2 puffs into the lungs 4 (four) times daily.   Yes [provider]  diclofenac Sodium (VOLTAREN) 1 % GEL Apply 1 application topically 3 (three) times daily as needed (knee pain).   Yes [provider]  LIDOCAINE EX Apply 1 application topically daily as needed (pain).   Yes [provider]  Liniments (BLUE-EMU SUPER STRENGTH) CREA Apply 1 application topically daily as needed (pain).   Yes [provider]  Multiple Vitamin (MULTIVITAMIN WITH MINERALS) TABS tablet Take 1 tablet by mouth daily.   Yes [provider]  omeprazole (PRILOSEC) 20 MG capsule Take 20 mg by mouth 2 (two) times daily before a meal.   Yes [provider]    Inpatient Medications: Scheduled Meds:  pantoprazole  40 mg Oral Daily   Continuous Infusions:  heparin 1,750 Units/hr (12/31/19 0846)   PRN Meds: docusate sodium, ipratropium-albuterol, polyethylene glycol  Allergies:   No Known Allergies  Social History:   Social History   Socioeconomic History   Marital status: Married    Spouse name: Not on file   Number of children: Not on file   Years of education: Not on file   Highest education level: Not on file  Occupational History   Not on file  Tobacco Use   Smoking status: Light Tobacco Smoker   Smokeless tobacco: Never Used  Substance and Sexual Activity   Alcohol use: Yes    Alcohol/week: 6.0 standard drinks    Types: 2 Glasses of wine, 4 Cans of beer per week   Drug use: No   Sexual activity: Yes  Other Topics Concern   Not on file  Social History Narrative   Marital status:  Married x 3 years; not happily married.      Children:  None      Lives: alone.      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      Tobacco: smoke cigars 2 per day.  Quit cigarettes in 2001; smoked x 15 years.      Alcohol:  Weekends 4 beers per week; 2 drinks per week.      Drugs:  None      Exercising:  Three days per week; active job.        Seatbelt:  100%      Guns:  None      Sexual activity: condoms; Gonorrhea in high school.  One partner in past year.  Last STD screening 2013.   Social Determinants of Health   Financial Resource Strain:    Difficulty of Paying Living Expenses:   Food Insecurity:    Worried About Charity fundraiser in the Last Year:    Arboriculturist in the Last Year:   Transportation Needs:  Lack of Transportation (Medical):    Lack of Transportation (Non-Medical):   Physical Activity:    Days of Exercise per Week:    Minutes of Exercise per Session:   Stress:    Feeling of Stress :   Social Connections:    Frequency of Communication with Friends and Family:    Frequency of Social Gatherings with Friends and Family:    Attends Religious Services:    Active Member of Clubs or Organizations:    Attends Music therapist:    Marital Status:   Intimate Partner Violence:    Fear of Current or Ex-Partner:    Emotionally Abused:    Physically Abused:    Sexually Abused:     Family History:    Family History  Problem Relation Age of Onset   Glaucoma Mother    Heart disease Father        stents/CAD/AMI age 28s.   Glaucoma Father      ROS:  Please see the history of present illness.   All other ROS reviewed and negative.     Physical Exam/Data:   Vitals:   12/30/19 2026 12/30/19 2033 12/31/19 0826 12/31/19 0837  BP: 124/85  (!) 143/99   Pulse: 61  91   Resp:   20   Temp: 98.2 F (36.8 C)  98.1 F (36.7 C)   TempSrc: Axillary     SpO2: 91%  96% (!) 89%  Weight:  79.3 kg    Height:  6\' 1"  (1.854 m)      Intake/Output Summary (Last 24 hours) at 12/31/2019 1246 Last data filed at 12/31/2019 0600 Gross per 24 hour   Intake 500 ml  Output 650 ml  Net -150 ml   Last 3 Weights 12/30/2019 12/29/2019 08/09/2018  Weight (lbs) 174 lb 13.2 oz 185 lb 14.4 oz 180 lb  Weight (kg) 79.3 kg 84.324 kg 81.647 kg     Body mass index is 23.07 kg/m.  General:  Well nourished, well developed, in no acute distress HEENT: normal Lymph: no adenopathy Neck: no JVD Endocrine:  No thryomegaly Vascular: No carotid bruits; FA pulses 2+ bilaterally without bruits  Cardiac:  normal S1, widely split S2; RRR; no murmur  Lungs:  clear to auscultation bilaterally, no wheezing, rhonchi or rales  Abd: soft, nontender, no hepatomegaly  Ext: no edema Musculoskeletal:  No deformities, BUE and BLE strength normal and equal Skin: warm and dry  Neuro:  CNs 2-12 intact, no focal abnormalities noted Psych:  Normal affect, but becomes very emotional when discussing his health problems  EKG:  The EKG was personally reviewed and demonstrates: Sinus rhythm, biatrial abnormality, RSR' V1-V2, S1Q3T3 pattern, T wave inversion V2-V4 (all abnormalities new compared to 2019). Telemetry:  Telemetry was personally reviewed and demonstrates: Sinus rhythm with a couple of brief episodes of paroxysmal atrial tachycardia hello  Relevant CV Studies: Echocardiogram 12-30-2019 1. Left ventricular ejection fraction, by estimation, is 35 to 40%. The  left ventricle has moderately decreased function. The left ventricle  demonstrates global hypokinesis. Left ventricular diastolic parameters are  consistent with Grade I diastolic  dysfunction (impaired relaxation). There is the interventricular septum is  flattened in systole and diastole, consistent with right ventricular  pressure and volume overload.  2. Right ventricular systolic function is moderately reduced.  3. Right atrial size was moderately dilated.  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 Trivial mitral valve regurgitation.  5. Unable to obtain adequate Doppler signal to estimate pulmonary  pressures. Tricuspid valve regurgitation is trivial.   Limited echo saline contrast study today (my review) shows evidence of atrial septal defect with primarily left to right shunt (negative contrast) but also with moderate amount of right to left shunt during quiet breathing  Laboratory Data:  High Sensitivity Troponin:   Recent Labs  Lab 12/29/19 1633 12/29/19 2221  TROPONINIHS 4,863* 6,642*     Chemistry Recent Labs  Lab 12/29/19 1633 12/29/19 1633 12/29/19 2333 12/30/19 0620 12/31/19 0537  NA 142   < > 139 143 141  K 5.4*   < > 5.1 5.5* 4.7  CL 110  --   --  111 111  CO2 19*  --   --  21* 20*  GLUCOSE 160*  --   --  129* 143*  BUN 37*  --   --  43* 49*  CREATININE 1.74*  --   --  1.96* 1.82*  CALCIUM 8.7*  --   --  8.5* 7.9*  GFRNONAA 42*  --   --  36* 40*  GFRAA 49*  --   --  42* 46*  ANIONGAP 13  --   --  11 10   < > = values in this interval not displayed.    Recent Labs  Lab 12/29/19 1633 12/30/19 0620  PROT 6.8 6.8  ALBUMIN 2.3* 2.1*  AST 120* 109*  ALT 143* 145*  ALKPHOS 132* 113  BILITOT 1.6* 1.3*   Hematology Recent Labs  Lab 12/29/19 1633 12/29/19 1633 12/29/19 2333 12/30/19 0620 12/31/19 0537  WBC 22.1*  --   --  20.8* 21.7*  RBC 5.14  --   --  5.00 4.55  HGB 14.6   < > 15.0 14.1 12.9*  HCT 43.3   < > 44.0 41.2 38.1*  MCV 84.2  --   --  82.4 83.7  MCH 28.4  --   --  28.2 28.4  MCHC 33.7  --   --  34.2 33.9  RDW 16.5*  --   --  16.2* 16.4*  PLT 132*  --   --  120* 147*   < > = values in this interval not displayed.   BNP Recent Labs  Lab 12/29/19 1633  BNP 1,086.8*    DDimer  Recent Labs  Lab 12/29/19 1633  DDIMER >20.00*     Radiology/Studies:  CT Angio Chest PE W and/or Wo Contrast  Result Date: 12/29/2019 CLINICAL DATA:  Short of breath, right-sided chest and rib pain, recent history of trauma EXAM: CT ANGIOGRAPHY CHEST WITH CONTRAST TECHNIQUE: Multidetector CT imaging of the chest was performed using the standard  protocol during bolus administration of intravenous contrast. Multiplanar CT image reconstructions and MIPs were obtained to evaluate the vascular anatomy. CONTRAST:  171mL OMNIPAQUE IOHEXOL 350 MG/ML SOLN COMPARISON:  None. FINDINGS: Cardiovascular: This is a technically adequate evaluation of the pulmonary vasculature. There are large bilateral pulmonary emboli filling the main pulmonary arteries. There is straightening of the interventricular septum and mild dilation of the right ventricle, with RV/LV ratio measuring 1.3. Along the nondependent surface of the right ventral, actually better visualized on the corresponding abdominal CT, there is likely a here mural thrombus. Echocardiography may be useful for further evaluation. No pericardial effusion. The thoracic aorta is normal in caliber without aneurysm or dissection. Mediastinum/Nodes: No enlarged mediastinal, hilar, or axillary lymph nodes. Thyroid gland, trachea, and esophagus demonstrate no significant findings. Lungs/Pleura: There is background emphysema. Bilateral areas of airspace disease are seen within the  dependent upper lobes and superior segment of the bilateral lower lobes. Ground-glass opacities are seen within the dependent lower lobes. No effusion or pneumothorax. Central airways are patent. Upper Abdomen: No acute abnormality. Musculoskeletal: No acute or destructive bony lesions. Reconstructed images demonstrate no additional findings. Review of the MIP images confirms the above findings. IMPRESSION: 1. Large bilateral pulmonary emboli with CT evidence of right heart strain (RV/LV Ratio = 1.3) consistent with at least submassive (intermediate risk) PE. The presence of right heart strain has been associated with an increased risk of morbidity and mortality. 2. Likely adherent mural thrombus along the anterior wall the right ventricle, better visualized on the corresponding abdominal CT. 3. Bilateral areas of consolidation and ground-glass  airspace disease, superimposed upon background emphysema. Findings favor pulmonary edema with developing pulmonary infarctions. 4.  Emphysema (ICD10-J43.9). These results were called by telephone at the time of interpretation on 12/29/2019 at 7:07 pm to provider Dr. Melina Copa, who verbally acknowledged these results. Electronically Signed   By: Randa Ngo M.D.   On: 12/29/2019 19:08   CT ABDOMEN PELVIS W CONTRAST  Result Date: 12/29/2019 CLINICAL DATA:  History of abdominal trauma, shortness of breath, right upper quadrant pain EXAM: CT ABDOMEN AND PELVIS WITH CONTRAST TECHNIQUE: Multidetector CT imaging of the abdomen and pelvis was performed using the standard protocol following bolus administration of intravenous contrast. CONTRAST:  186mL OMNIPAQUE IOHEXOL 350 MG/ML SOLN COMPARISON:  None. FINDINGS: Lower chest: Large bilateral pulmonary emboli are seen. Diffuse ground-glass opacities throughout the lung bases. Please refer to corresponding CT chest report for important discussion of these findings. Hepatobiliary: No hepatic injury or perihepatic hematoma. Gallbladder is unremarkable Pancreas: Unremarkable. No pancreatic ductal dilatation or surrounding inflammatory changes. Spleen: No splenic injury or perisplenic hematoma. Adrenals/Urinary Tract: There are multiple wedge-shaped hypodensities within the left kidney consistent with renal infarcts. There is no surrounding fluid or perinephric fat stranding to suggest posttraumatic etiology. The right kidney enhances normally. The adrenals are unremarkable. Bladder is grossly normal. Stomach/Bowel: No bowel obstruction or ileus. Normal appendix right lower quadrant. No bowel wall thickening or inflammatory change. Vascular/Lymphatic: Minimal atherosclerosis of the abdominal aorta. The visualized vascular structures opacify normally. No pathologic adenopathy. Reproductive: Prostate is unremarkable. Other: No free fluid or free gas. No abdominal wall hernia. No  evidence of soft tissue injury related to recent trauma. Musculoskeletal: No acute or destructive bony lesions. Reconstructed images demonstrate no additional findings. IMPRESSION: 1. Multiple left renal cortical infarcts. 2. Large bilateral pulmonary emboli. Please refer to corresponding CT chest report for important discussion of these findings. 3. No evidence of intra-abdominal or intrapelvic trauma. 4. Aortic Atherosclerosis (ICD10-I70.0). These results were called by telephone at the time of interpretation on 12/29/2019 at 7:03 pm to provider Dr. Melina Copa, who verbally acknowledged these results. Electronically Signed   By: Randa Ngo M.D.   On: 12/29/2019 19:08   IR Angiogram Pulmonary Bilateral Selective  Result Date: 12/30/2019 EXAM: ADDITIONAL ARTERIOGRAPHY; IR ULTRASOUND GUIDANCE VASC ACCESS RIGHT; THROMBECTOMY MECHANICAL; BILATERAL PULMONARY ARTERIOGRAPHY COMPARISON:  CT a chest 12/29/2019 MEDICATIONS: 10000 units heparin ANESTHESIA/SEDATION: Versed 2.5 mg IV; Fentanyl 125 mcg IV Moderate Sedation Time:  107 minutes The patient was continuously monitored during the procedure by the interventional radiology nurse under my direct supervision. FLUOROSCOPY TIME:  Fluoroscopy Time: 28 minutes 54 seconds (344 mGy). COMPLICATIONS: None immediate. TECHNIQUE: Informed written consent was obtained from the patient after a thorough discussion of the procedural risks, benefits and alternatives. All questions were addressed. Maximal Sterile Barrier Technique  was utilized including caps, mask, sterile gowns, sterile gloves, sterile drape, hand hygiene and skin antiseptic. A timeout was performed prior to the initiation of the procedure. The right common femoral vein was interrogated with ultrasound and found to be widely patent. An image was obtained and stored for the medical record. Local anesthesia was attained by infiltration with 1% lidocaine. A small dermatotomy was made. Under real-time sonographic  guidance, the vessel was punctured with a 21 gauge micropuncture needle. Using standard technique, the initial micro needle was exchanged over a 0.018 micro wire for a transitional 4 [place] micro sheath. The micro sheath was then exchanged over a 0.035 wire for a 6 French 11 cm vascular sheath. A right iliac venogram was performed through the sheath. No evidence of iliac or caval DVT. An angled pigtail catheter was advanced over a Bentson wire and navigated through the heart and into the main pulmonary artery. The main pulmonary arterial pressure was measured at 42/28 (mean 28) mm Hg. The catheter was navigated into the right main pulmonary artery. Pulmonary arteriography was performed. There is large volume thrombus within the right main pulmonary artery extending into the right lower lobe and segmental pulmonary arteries. Very poor overall perfusion of the right middle and lower lobes. There is adequate perfusion of the right upper lobe. The pigtail catheter was exchanged over a rose in wire for an angled catheter. The angled catheter was successfully navigated into a right lower lobe peripheral branch. Pulmonary arteriography was performed confirming catheter location and that the vessel was of adequate size. An Amplatz wire was advanced. The catheter was removed. The 24 [place] aspiration catheter was then successfully advanced over the wire and positioned in the right main pulmonary artery. Suction aspiration was performed. There was extirpation of thrombus material from the right lower lobe pulmonary artery. Follow-up pulmonary arteriography demonstrates persistent and likely chronic thrombus adherent to the inferior wall of the right main pulmonary artery. The Nitinol discs were advanced through the catheter and used to agitated the residual chronic thrombus. This was performed twice. Additional aspiration was performed with extirpation of additional thrombus. Follow-up venography demonstrates improved  flow into the right lower lobe pulmonary artery with improved parenchymal blush, particularly in the right lower lobe. There is still persistent residual chronic thrombus along the inferior wall of the right lower lobe pulmonary artery. The aspiration catheter was brought back into the main pulmonary artery. The angled catheter was introduced coaxially in used to select the left main pulmonary artery. Arteriography was performed. There is large volume nearly occlusive thrombus in the left main pulmonary artery extending into the left lower pulmonary artery. The angled catheter was successfully advanced into a peripheral branch of the left lower lobe pulmonary artery. Arteriography was performed again confirming catheter location and terminal pulmonary arterial size. The Amplatz wire was advanced into the artery. The catheter was removed. The 55 French aspiration catheter was then advanced over the wire. Aspiration was performed without successful thrombus retrieval. Therefore, the 16 French aspiration catheter was advanced coaxially through the 24 French catheter and used to engage the thrombus. Aspiration was performed with successful extirpation of acute and chronic thrombus material. Follow-up arteriography demonstrates improved flow in the lower lobe pulmonary artery with increased parenchymal blush. There is some residual chronic thrombus remaining. However, at this point in the case, blood loss was approximately 500 mL. Repeat pulmonary arterial pressures were obtained. The mean main pulmonary arterial pressure has decreased 24 mm Hg. Patient is doing well  and hemodynamically stable. The decision was made to pursue no further attempts at extirpation of the residual chronic thrombus. FINDINGS: Pre thrombectomy main mean pulmonary arterial pressure 28 mm Hg. Post extirpation mean pulmonary arterial pressure 24 mm Hg. IMPRESSION: 1. Large volume bilateral pulmonary emboli with resultant pulmonary arterial  hypertension. 2. Successful extirpation of acute and chronic thrombus material from the bilateral main and lower lobe pulmonary arteries. 3. Improved pulmonary arterial hypertension. Signed, Criselda Peaches, MD, [facility] Rad[facility] Electronically Signed   By: Jacqulynn Cadet M.D.   On: 12/30/2019 15:41   IR Angiogram Selective Each Additional Vessel  Result Date: 12/30/2019 EXAM: ADDITIONAL ARTERIOGRAPHY; IR ULTRASOUND GUIDANCE VASC ACCESS RIGHT; THROMBECTOMY MECHANICAL; BILATERAL PULMONARY ARTERIOGRAPHY COMPARISON:  CT a chest 12/29/2019 MEDICATIONS: 10000 units heparin ANESTHESIA/SEDATION: Versed 2.5 mg IV; Fentanyl 125 mcg IV Moderate Sedation Time:  107 minutes The patient was continuously monitored during the procedure by the interventional radiology nurse under my direct supervision. FLUOROSCOPY TIME:  Fluoroscopy Time: 28 minutes 54 seconds (344 mGy). COMPLICATIONS: None immediate. TECHNIQUE: Informed written consent was obtained from the patient after a thorough discussion of the procedural risks, benefits and alternatives. All questions were addressed. Maximal Sterile Barrier Technique was utilized including caps, mask, sterile gowns, sterile gloves, sterile drape, hand hygiene and skin antiseptic. A timeout was performed prior to the initiation of the procedure. The right common femoral vein was interrogated with ultrasound and found to be widely patent. An image was obtained and stored for the medical record. Local anesthesia was attained by infiltration with 1% lidocaine. A small dermatotomy was made. Under real-time sonographic guidance, the vessel was punctured with a 21 gauge micropuncture needle. Using standard technique, the initial micro needle was exchanged over a 0.018 micro wire for a transitional 4 [place] micro sheath. The micro sheath was then exchanged over a 0.035 wire for a 6 French 11 cm vascular sheath. A right iliac  venogram was performed through the sheath. No evidence of iliac or caval DVT. An angled pigtail catheter was advanced over a Bentson wire and navigated through the heart and into the main pulmonary artery. The main pulmonary arterial pressure was measured at 42/28 (mean 28) mm Hg. The catheter was navigated into the right main pulmonary artery. Pulmonary arteriography was performed. There is large volume thrombus within the right main pulmonary artery extending into the right lower lobe and segmental pulmonary arteries. Very poor overall perfusion of the right middle and lower lobes. There is adequate perfusion of the right upper lobe. The pigtail catheter was exchanged over a rose in wire for an angled catheter. The angled catheter was successfully navigated into a right lower lobe peripheral branch. Pulmonary arteriography was performed confirming catheter location and that the vessel was of adequate size. An Amplatz wire was advanced. The catheter was removed. The 24 [place] Inari aspiration catheter was then successfully advanced over the wire and positioned in the right main pulmonary artery. Suction aspiration was performed. There was extirpation of thrombus material from the right lower lobe pulmonary artery. Follow-up pulmonary arteriography demonstrates persistent and likely chronic thrombus adherent to the inferior wall of the right main pulmonary artery. The Nitinol discs were advanced through the catheter and used to agitated the residual chronic thrombus. This was performed twice. Additional aspiration was performed with extirpation of additional thrombus. Follow-up venography demonstrates improved flow into the right lower lobe pulmonary artery with improved parenchymal blush, particularly in the right lower lobe. There is still  persistent residual chronic thrombus along the inferior wall of the right lower lobe pulmonary artery. The aspiration catheter was brought back into the main pulmonary artery.  The angled catheter was introduced coaxially in used to select the left main pulmonary artery. Arteriography was performed. There is large volume nearly occlusive thrombus in the left main pulmonary artery extending into the left lower pulmonary artery. The angled catheter was successfully advanced into a peripheral branch of the left lower lobe pulmonary artery. Arteriography was performed again confirming catheter location and terminal pulmonary arterial size. The Amplatz wire was advanced into the artery. The catheter was removed. The 48 French aspiration catheter was then advanced over the wire. Aspiration was performed without successful thrombus retrieval. Therefore, the 16 French aspiration catheter was advanced coaxially through the 24 French catheter and used to engage the thrombus. Aspiration was performed with successful extirpation of acute and chronic thrombus material. Follow-up arteriography demonstrates improved flow in the lower lobe pulmonary artery with increased parenchymal blush. There is some residual chronic thrombus remaining. However, at this point in the case, blood loss was approximately 500 mL. Repeat pulmonary arterial pressures were obtained. The mean main pulmonary arterial pressure has decreased 24 mm Hg. Patient is doing well and hemodynamically stable. The decision was made to pursue no further attempts at extirpation of the residual chronic thrombus. FINDINGS: Pre thrombectomy main mean pulmonary arterial pressure 28 mm Hg. Post extirpation mean pulmonary arterial pressure 24 mm Hg. IMPRESSION: 1. Large volume bilateral pulmonary emboli with resultant pulmonary arterial hypertension. 2. Successful extirpation of acute and chronic thrombus material from the bilateral main and lower lobe pulmonary arteries. 3. Improved pulmonary arterial hypertension. Signed, Criselda Peaches, MD, [facility] Radiology Electronically Signed    By: Jacqulynn Cadet M.D.   On: 12/30/2019 15:41   IR Angiogram Selective Each Additional Vessel  Result Date: 12/30/2019 EXAM: ADDITIONAL ARTERIOGRAPHY; IR ULTRASOUND GUIDANCE VASC ACCESS RIGHT; THROMBECTOMY MECHANICAL; BILATERAL PULMONARY ARTERIOGRAPHY COMPARISON:  CT a chest 12/29/2019 MEDICATIONS: 10000 units heparin ANESTHESIA/SEDATION: Versed 2.5 mg IV; Fentanyl 125 mcg IV Moderate Sedation Time:  107 minutes The patient was continuously monitored during the procedure by the interventional radiology nurse under my direct supervision. FLUOROSCOPY TIME:  Fluoroscopy Time: 28 minutes 54 seconds (344 mGy). COMPLICATIONS: None immediate. TECHNIQUE: Informed written consent was obtained from the patient after a thorough discussion of the procedural risks, benefits and alternatives. All questions were addressed. Maximal Sterile Barrier Technique was utilized including caps, mask, sterile gowns, sterile gloves, sterile drape, hand hygiene and skin antiseptic. A timeout was performed prior to the initiation of the procedure. The right common femoral vein was interrogated with ultrasound and found to be widely patent. An image was obtained and stored for the medical record. Local anesthesia was attained by infiltration with 1% lidocaine. A small dermatotomy was made. Under real-time sonographic guidance, the vessel was punctured with a 21 gauge micropuncture needle. Using standard technique, the initial micro needle was exchanged over a 0.018 micro wire for a transitional 4 [place] micro sheath. The micro sheath was then exchanged over a 0.035 wire for a 6 French 11 cm vascular sheath. A right iliac venogram was performed through the sheath. No evidence of iliac or caval DVT. An angled pigtail catheter was advanced over a Bentson wire and navigated through the heart and into the main pulmonary artery. The main pulmonary arterial pressure was measured at 42/28 (mean 28) mm Hg. The catheter was navigated into the  right main pulmonary artery. Pulmonary arteriography was performed. There is large volume thrombus within the right main pulmonary artery extending into the right lower lobe and segmental pulmonary arteries. Very poor overall perfusion of the right middle and lower lobes. There is adequate perfusion of the right upper lobe. The pigtail catheter was exchanged over a rose in wire for an angled catheter. The angled catheter was successfully navigated into a right lower lobe peripheral branch. Pulmonary arteriography was performed confirming catheter location and that the vessel was of adequate size. An Amplatz wire was advanced. The catheter was removed. The 24 [place] Inari aspiration catheter was then successfully advanced over the wire and positioned in the right main pulmonary artery. Suction aspiration was performed. There was extirpation of thrombus material from the right lower lobe pulmonary artery. Follow-up pulmonary arteriography demonstrates persistent and likely chronic thrombus adherent to the inferior wall of the right main pulmonary artery. The Nitinol discs were advanced through the catheter and used to agitated the residual chronic thrombus. This was performed twice. Additional aspiration was performed with extirpation of additional thrombus. Follow-up venography demonstrates improved flow into the right lower lobe pulmonary artery with improved parenchymal blush, particularly in the right lower lobe. There is still persistent residual chronic thrombus along the inferior wall of the right lower lobe pulmonary artery. The aspiration catheter was brought back into the main pulmonary artery. The angled catheter was introduced coaxially in used to select the left main pulmonary artery. Arteriography was performed. There is large volume nearly occlusive thrombus in the left main pulmonary artery extending into the left lower pulmonary artery. The angled catheter was successfully advanced into a peripheral  branch of the left lower lobe pulmonary artery. Arteriography was performed again confirming catheter location and terminal pulmonary arterial size. The Amplatz wire was advanced into the artery. The catheter was removed. The 59 French aspiration catheter was then advanced over the wire. Aspiration was performed without successful thrombus retrieval. Therefore, the 16 French aspiration catheter was advanced coaxially through the 24 French catheter and used to engage the thrombus. Aspiration was performed with successful extirpation of acute and chronic thrombus material. Follow-up arteriography demonstrates improved flow in the lower lobe pulmonary artery with increased parenchymal blush. There is some residual chronic thrombus remaining. However, at this point in the case, blood loss was approximately 500 mL. Repeat pulmonary arterial pressures were obtained. The mean main pulmonary arterial pressure has decreased 24 mm Hg. Patient is doing well and hemodynamically stable. The decision was made to pursue no further attempts at extirpation of the residual chronic thrombus. FINDINGS: Pre thrombectomy main mean pulmonary arterial pressure 28 mm Hg. Post extirpation mean pulmonary arterial pressure 24 mm Hg. IMPRESSION: 1. Large volume bilateral pulmonary emboli with resultant pulmonary arterial hypertension. 2. Successful extirpation of acute and chronic thrombus material from the bilateral main and lower lobe pulmonary arteries. 3. Improved pulmonary arterial hypertension. Signed, Criselda Peaches, MD, [facility] Radiology Electronically Signed   By: Jacqulynn Cadet M.D.   On: 12/30/2019 15:41   IR THROMBECT PRIM MECH INIT (INCLU) MOD SED  Result Date: 12/30/2019 EXAM: ADDITIONAL ARTERIOGRAPHY; IR ULTRASOUND GUIDANCE VASC ACCESS RIGHT; THROMBECTOMY MECHANICAL; BILATERAL PULMONARY ARTERIOGRAPHY COMPARISON:  CT a chest 12/29/2019 MEDICATIONS: 10000 units  heparin ANESTHESIA/SEDATION: Versed 2.5 mg IV; Fentanyl 125 mcg IV Moderate Sedation Time:  107 minutes The patient was continuously monitored during the procedure by the interventional radiology nurse under my direct supervision. FLUOROSCOPY TIME:  Fluoroscopy  Time: 28 minutes 54 seconds (344 mGy). COMPLICATIONS: None immediate. TECHNIQUE: Informed written consent was obtained from the patient after a thorough discussion of the procedural risks, benefits and alternatives. All questions were addressed. Maximal Sterile Barrier Technique was utilized including caps, mask, sterile gowns, sterile gloves, sterile drape, hand hygiene and skin antiseptic. A timeout was performed prior to the initiation of the procedure. The right common femoral vein was interrogated with ultrasound and found to be widely patent. An image was obtained and stored for the medical record. Local anesthesia was attained by infiltration with 1% lidocaine. A small dermatotomy was made. Under real-time sonographic guidance, the vessel was punctured with a 21 gauge micropuncture needle. Using standard technique, the initial micro needle was exchanged over a 0.018 micro wire for a transitional 4 [place] micro sheath. The micro sheath was then exchanged over a 0.035 wire for a 6 French 11 cm vascular sheath. A right iliac venogram was performed through the sheath. No evidence of iliac or caval DVT. An angled pigtail catheter was advanced over a Bentson wire and navigated through the heart and into the main pulmonary artery. The main pulmonary arterial pressure was measured at 42/28 (mean 28) mm Hg. The catheter was navigated into the right main pulmonary artery. Pulmonary arteriography was performed. There is large volume thrombus within the right main pulmonary artery extending into the right lower lobe and segmental pulmonary arteries. Very poor overall perfusion of the right middle and lower lobes. There is adequate perfusion of the right upper  lobe. The pigtail catheter was exchanged over a rose in wire for an angled catheter. The angled catheter was successfully navigated into a right lower lobe peripheral branch. Pulmonary arteriography was performed confirming catheter location and that the vessel was of adequate size. An Amplatz wire was advanced. The catheter was removed. The 24 [place] Inari aspiration catheter was then successfully advanced over the wire and positioned in the right main pulmonary artery. Suction aspiration was performed. There was extirpation of thrombus material from the right lower lobe pulmonary artery. Follow-up pulmonary arteriography demonstrates persistent and likely chronic thrombus adherent to the inferior wall of the right main pulmonary artery. The Nitinol discs were advanced through the catheter and used to agitated the residual chronic thrombus. This was performed twice. Additional aspiration was performed with extirpation of additional thrombus. Follow-up venography demonstrates improved flow into the right lower lobe pulmonary artery with improved parenchymal blush, particularly in the right lower lobe. There is still persistent residual chronic thrombus along the inferior wall of the right lower lobe pulmonary artery. The aspiration catheter was brought back into the main pulmonary artery. The angled catheter was introduced coaxially in used to select the left main pulmonary artery. Arteriography was performed. There is large volume nearly occlusive thrombus in the left main pulmonary artery extending into the left lower pulmonary artery. The angled catheter was successfully advanced into a peripheral branch of the left lower lobe pulmonary artery. Arteriography was performed again confirming catheter location and terminal pulmonary arterial size. The Amplatz wire was advanced into the artery. The catheter was removed. The 36 French aspiration catheter was then advanced over the wire. Aspiration was performed without  successful thrombus retrieval. Therefore, the 16 French aspiration catheter was advanced coaxially through the 24 French catheter and used to engage the thrombus. Aspiration was performed with successful extirpation of acute and chronic thrombus material. Follow-up arteriography demonstrates improved flow in the lower lobe pulmonary artery with increased parenchymal blush. There is some  residual chronic thrombus remaining. However, at this point in the case, blood loss was approximately 500 mL. Repeat pulmonary arterial pressures were obtained. The mean main pulmonary arterial pressure has decreased 24 mm Hg. Patient is doing well and hemodynamically stable. The decision was made to pursue no further attempts at extirpation of the residual chronic thrombus. FINDINGS: Pre thrombectomy main mean pulmonary arterial pressure 28 mm Hg. Post extirpation mean pulmonary arterial pressure 24 mm Hg. IMPRESSION: 1. Large volume bilateral pulmonary emboli with resultant pulmonary arterial hypertension. 2. Successful extirpation of acute and chronic thrombus material from the bilateral main and lower lobe pulmonary arteries. 3. Improved pulmonary arterial hypertension. Signed, Criselda Peaches, MD, [place] [facility] Radiology Electronically Signed   By: Jacqulynn Cadet M.D.   On: 12/30/2019 15:41   IR THROMBECT PRIM MECH ADD (INCLU) MOD SED  Result Date: 12/30/2019 EXAM: ADDITIONAL ARTERIOGRAPHY; IR ULTRASOUND GUIDANCE VASC ACCESS RIGHT; THROMBECTOMY MECHANICAL; BILATERAL PULMONARY ARTERIOGRAPHY COMPARISON:  CT a chest 12/29/2019 MEDICATIONS: 10000 units heparin ANESTHESIA/SEDATION: Versed 2.5 mg IV; Fentanyl 125 mcg IV Moderate Sedation Time:  107 minutes The patient was continuously monitored during the procedure by the interventional radiology nurse under my direct supervision. FLUOROSCOPY TIME:  Fluoroscopy Time: 28 minutes 54 seconds (344 mGy). COMPLICATIONS: None  immediate. TECHNIQUE: Informed written consent was obtained from the patient after a thorough discussion of the procedural risks, benefits and alternatives. All questions were addressed. Maximal Sterile Barrier Technique was utilized including caps, mask, sterile gowns, sterile gloves, sterile drape, hand hygiene and skin antiseptic. A timeout was performed prior to the initiation of the procedure. The right common femoral vein was interrogated with ultrasound and found to be widely patent. An image was obtained and stored for the medical record. Local anesthesia was attained by infiltration with 1% lidocaine. A small dermatotomy was made. Under real-time sonographic guidance, the vessel was punctured with a 21 gauge micropuncture needle. Using standard technique, the initial micro needle was exchanged over a 0.018 micro wire for a transitional 4 [place] micro sheath. The micro sheath was then exchanged over a 0.035 wire for a 6 French 11 cm vascular sheath. A right iliac venogram was performed through the sheath. No evidence of iliac or caval DVT. An angled pigtail catheter was advanced over a Bentson wire and navigated through the heart and into the main pulmonary artery. The main pulmonary arterial pressure was measured at 42/28 (mean 28) mm Hg. The catheter was navigated into the right main pulmonary artery. Pulmonary arteriography was performed. There is large volume thrombus within the right main pulmonary artery extending into the right lower lobe and segmental pulmonary arteries. Very poor overall perfusion of the right middle and lower lobes. There is adequate perfusion of the right upper lobe. The pigtail catheter was exchanged over a rose in wire for an angled catheter. The angled catheter was successfully navigated into a right lower lobe peripheral branch. Pulmonary arteriography was performed confirming catheter location and that the vessel was of adequate size. An Amplatz wire was advanced. The  catheter was removed. The 24 [place] Inari aspiration catheter was then successfully advanced over the wire and positioned in the right main pulmonary artery. Suction aspiration was performed. There was extirpation of thrombus material from the right lower lobe pulmonary artery. Follow-up pulmonary arteriography demonstrates persistent and likely chronic thrombus adherent to the inferior wall of the right main pulmonary artery. The Nitinol discs were advanced through the catheter and used to agitated the residual  chronic thrombus. This was performed twice. Additional aspiration was performed with extirpation of additional thrombus. Follow-up venography demonstrates improved flow into the right lower lobe pulmonary artery with improved parenchymal blush, particularly in the right lower lobe. There is still persistent residual chronic thrombus along the inferior wall of the right lower lobe pulmonary artery. The aspiration catheter was brought back into the main pulmonary artery. The angled catheter was introduced coaxially in used to select the left main pulmonary artery. Arteriography was performed. There is large volume nearly occlusive thrombus in the left main pulmonary artery extending into the left lower pulmonary artery. The angled catheter was successfully advanced into a peripheral branch of the left lower lobe pulmonary artery. Arteriography was performed again confirming catheter location and terminal pulmonary arterial size. The Amplatz wire was advanced into the artery. The catheter was removed. The 45 French aspiration catheter was then advanced over the wire. Aspiration was performed without successful thrombus retrieval. Therefore, the 16 French aspiration catheter was advanced coaxially through the 24 French catheter and used to engage the thrombus. Aspiration was performed with successful extirpation of acute and chronic thrombus material. Follow-up arteriography demonstrates improved flow in the  lower lobe pulmonary artery with increased parenchymal blush. There is some residual chronic thrombus remaining. However, at this point in the case, blood loss was approximately 500 mL. Repeat pulmonary arterial pressures were obtained. The mean main pulmonary arterial pressure has decreased 24 mm Hg. Patient is doing well and hemodynamically stable. The decision was made to pursue no further attempts at extirpation of the residual chronic thrombus. FINDINGS: Pre thrombectomy main mean pulmonary arterial pressure 28 mm Hg. Post extirpation mean pulmonary arterial pressure 24 mm Hg. IMPRESSION: 1. Large volume bilateral pulmonary emboli with resultant pulmonary arterial hypertension. 2. Successful extirpation of acute and chronic thrombus material from the bilateral main and lower lobe pulmonary arteries. 3. Improved pulmonary arterial hypertension. Signed, Criselda Peaches, MD, [place] [facility] Radiology Electronically Signed   By: Jacqulynn Cadet M.D.   On: 12/30/2019 15:41   IR US Guide Vasc Access Right  Result Date: 12/30/2019 EXAM: ADDITIONAL ARTERIOGRAPHY; IR ULTRASOUND GUIDANCE VASC ACCESS RIGHT; THROMBECTOMY MECHANICAL; BILATERAL PULMONARY ARTERIOGRAPHY COMPARISON:  CT a chest 12/29/2019 MEDICATIONS: 10000 units heparin ANESTHESIA/SEDATION: Versed 2.5 mg IV; Fentanyl 125 mcg IV Moderate Sedation Time:  107 minutes The patient was continuously monitored during the procedure by the interventional radiology nurse under my direct supervision. FLUOROSCOPY TIME:  Fluoroscopy Time: 28 minutes 54 seconds (344 mGy). COMPLICATIONS: None immediate. TECHNIQUE: Informed written consent was obtained from the patient after a thorough discussion of the procedural risks, benefits and alternatives. All questions were addressed. Maximal Sterile Barrier Technique was utilized including caps, mask, sterile gowns, sterile gloves, sterile drape, hand hygiene and skin  antiseptic. A timeout was performed prior to the initiation of the procedure. The right common femoral vein was interrogated with ultrasound and found to be widely patent. An image was obtained and stored for the medical record. Local anesthesia was attained by infiltration with 1% lidocaine. A small dermatotomy was made. Under real-time sonographic guidance, the vessel was punctured with a 21 gauge micropuncture needle. Using standard technique, the initial micro needle was exchanged over a 0.018 micro wire for a transitional 4 [place] micro sheath. The micro sheath was then exchanged over a 0.035 wire for a 6 French 11 cm vascular sheath. A right iliac venogram was performed through the sheath. No evidence of iliac or caval DVT. An  angled pigtail catheter was advanced over a Bentson wire and navigated through the heart and into the main pulmonary artery. The main pulmonary arterial pressure was measured at 42/28 (mean 28) mm Hg. The catheter was navigated into the right main pulmonary artery. Pulmonary arteriography was performed. There is large volume thrombus within the right main pulmonary artery extending into the right lower lobe and segmental pulmonary arteries. Very poor overall perfusion of the right middle and lower lobes. There is adequate perfusion of the right upper lobe. The pigtail catheter was exchanged over a rose in wire for an angled catheter. The angled catheter was successfully navigated into a right lower lobe peripheral branch. Pulmonary arteriography was performed confirming catheter location and that the vessel was of adequate size. An Amplatz wire was advanced. The catheter was removed. The 24 [place] Inari aspiration catheter was then successfully advanced over the wire and positioned in the right main pulmonary artery. Suction aspiration was performed. There was extirpation of thrombus material from the right lower lobe pulmonary artery. Follow-up pulmonary arteriography demonstrates  persistent and likely chronic thrombus adherent to the inferior wall of the right main pulmonary artery. The Nitinol discs were advanced through the catheter and used to agitated the residual chronic thrombus. This was performed twice. Additional aspiration was performed with extirpation of additional thrombus. Follow-up venography demonstrates improved flow into the right lower lobe pulmonary artery with improved parenchymal blush, particularly in the right lower lobe. There is still persistent residual chronic thrombus along the inferior wall of the right lower lobe pulmonary artery. The aspiration catheter was brought back into the main pulmonary artery. The angled catheter was introduced coaxially in used to select the left main pulmonary artery. Arteriography was performed. There is large volume nearly occlusive thrombus in the left main pulmonary artery extending into the left lower pulmonary artery. The angled catheter was successfully advanced into a peripheral branch of the left lower lobe pulmonary artery. Arteriography was performed again confirming catheter location and terminal pulmonary arterial size. The Amplatz wire was advanced into the artery. The catheter was removed. The 77 French aspiration catheter was then advanced over the wire. Aspiration was performed without successful thrombus retrieval. Therefore, the 16 French aspiration catheter was advanced coaxially through the 24 French catheter and used to engage the thrombus. Aspiration was performed with successful extirpation of acute and chronic thrombus material. Follow-up arteriography demonstrates improved flow in the lower lobe pulmonary artery with increased parenchymal blush. There is some residual chronic thrombus remaining. However, at this point in the case, blood loss was approximately 500 mL. Repeat pulmonary arterial pressures were obtained. The mean main pulmonary arterial pressure has decreased 24 mm Hg. Patient is doing well and  hemodynamically stable. The decision was made to pursue no further attempts at extirpation of the residual chronic thrombus. FINDINGS: Pre thrombectomy main mean pulmonary arterial pressure 28 mm Hg. Post extirpation mean pulmonary arterial pressure 24 mm Hg. IMPRESSION: 1. Large volume bilateral pulmonary emboli with resultant pulmonary arterial hypertension. 2. Successful extirpation of acute and chronic thrombus material from the bilateral main and lower lobe pulmonary arteries. 3. Improved pulmonary arterial hypertension. Signed, Criselda Peaches, MD, [facility] Electronically Signed   By: Jacqulynn Cadet M.D.   On: 12/30/2019 15:41   DG Chest Port 1 View  Result Date: 12/29/2019 CLINICAL DATA:  Hypoxia shortness of breath. EXAM: PORTABLE CHEST 1 VIEW COMPARISON:  07/25/2018 FINDINGS: Globular appearance of cardiac silhouette with cardiac enlargement  that is likely also accentuated by portable technique. Hilar structures are unremarkable. Patchy opacities in the RIGHT and LEFT mid chest. No dense consolidation. No sign of pleural effusion. Visualized skeletal structures are unremarkable. IMPRESSION: Patchy opacities in the RIGHT and LEFT mid chest, potentially related to developing infection, viral or atypical process. Given history of trauma on the side of trauma small area of contusion is also considered. Globular cardiac silhouette.  Pericardial effusion not excluded. Electronically Signed   By: Zetta Bills M.D.   On: 12/29/2019 17:32   ECHOCARDIOGRAM COMPLETE  Result Date: 12/30/2019    ECHOCARDIOGRAM LIMITED REPORT   Patient Name:   AARAF PLAZA Date of Exam: 12/30/2019 Medical Rec #:  [id number]       Height:       73.0 in Accession #:    [id number]      Weight:       185.9 lb Date of Birth:  October 13, 1960        BSA:          2.086 m Patient Age:    76 years        BP:           142/106 mmHg Patient Gender: M               HR:            90 bpm. Exam Location:  Inpatient Procedure: 2D Echo, Cardiac Doppler and Color Doppler Indications:    I26.02 Pulmonary embolus  History:        Patient has no prior history of Echocardiogram examinations.  Sonographer:    Merrie Roof RDCS Referring Phys: [id number] [place]  1. Left ventricular ejection fraction, by estimation, is 35 to 40%. The left ventricle has moderately decreased function. The left ventricle demonstrates global hypokinesis. Left ventricular diastolic parameters are consistent with Grade I diastolic dysfunction (impaired relaxation). There is the interventricular septum is flattened in systole and diastole, consistent with right ventricular pressure and volume overload.  2. Right ventricular systolic function is moderately reduced.  3. Right atrial size was moderately dilated.  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 Trivial mitral valve regurgitation.  5. Unable to obtain adequate Doppler signal to estimate pulmonary pressures. Tricuspid valve regurgitation is trivial. FINDINGS  Left Ventricle: Left ventricular ejection fraction, by estimation, is 35 to 40%. The left ventricle has moderately decreased function. The left ventricle demonstrates global hypokinesis. The interventricular septum is flattened in systole and diastole, consistent with right ventricular pressure and volume overload. Right Ventricle: Right ventricular systolic function is moderately reduced. Right Atrium: Right atrial size was moderately dilated. Mitral Valve: The mitral valve is myxomatous. There is moderate thickening of the mitral valve leaflet(s). There is moderate calcification of the mitral valve leaflet(s). Trivial mitral valve regurgitation. Tricuspid Valve: Unable to obtain adequate Doppler signal to estimate pulmonary pressures. The tricuspid valve is normal in structure. Tricuspid valve regurgitation is trivial. Pulmonic Valve: Pulmonic valve regurgitation is mild.  LEFT VENTRICLE PLAX 2D  LVIDd:         4.40 cm     Diastology LVIDs:         4.50 cm     LV e' lateral:   6.09 cm/s LV PW:         0.90 cm     LV E/e' lateral: 5.6 LV IVS:        1.00 cm     LV e' medial:    4.68  cm/s LVOT diam:     2.00 cm     LV E/e' medial:  7.2 LV SV:         30 LV SV Index:   14 LVOT Area:     3.14 cm  LV Volumes (MOD) LV vol d, MOD A2C: 97.5 ml LV vol d, MOD A4C: 92.5 ml LV vol s, MOD A2C: 71.5 ml LV vol s, MOD A4C: 47.8 ml LV SV MOD A2C:     26.0 ml LV SV MOD A4C:     92.5 ml LV SV MOD BP:      37.6 ml RIGHT VENTRICLE            IVC RV Basal diam:  5.20 cm    IVC diam: 2.00 cm RV Mid diam:    4.90 cm RV S prime:     9.68 cm/s TAPSE (M-mode): 1.4 cm LEFT ATRIUM             Index       RIGHT ATRIUM           Index LA diam:        3.60 cm 1.73 cm/m  RA Area:     24.50 cm LA Vol (A2C):   71.4 ml 34.23 ml/m RA Volume:   89.10 ml  42.72 ml/m LA Vol (A4C):   32.8 ml 15.73 ml/m LA Biplane Vol: 49.0 ml 23.49 ml/m  AORTIC VALVE LVOT Vmax:   66.60 cm/s LVOT Vmean:  36.200 cm/s LVOT VTI:    0.096 m  AORTA Ao Root diam: 3.20 cm Ao Asc diam:  3.00 cm MITRAL VALVE MV Area (PHT): 5.23 cm    SHUNTS MV Decel Time: 145 msec    Systemic VTI:  0.10 m MV E velocity: 33.80 cm/s  Systemic Diam: 2.00 cm MV A velocity: 55.70 cm/s MV E/A ratio:  0.61 Candee Furbish MD Electronically signed by Candee Furbish MD Signature Date/Time: 12/30/2019/2:26:36 PM    Final    VAS [place] LOWER EXTREMITY VENOUS (DVT)  Result Date: 12/31/2019  Lower Venous DVTStudy Indications: Pulmonary embolism, and recent left hamtring injury.  Comparison Study: No prior study on file Performing Technologist: Sharion Dove RVS  Examination Guidelines: A complete evaluation includes B-mode imaging, spectral Doppler, color Doppler, and power Doppler as needed of all accessible portions of each vessel. Bilateral testing is considered an integral part of a complete examination. Limited examinations for reoccurring indications may be performed as noted. The reflux portion  of the exam is performed with the patient in reverse Trendelenburg.  +---------+---------------+---------+-----------+----------+--------------+  RIGHT     Compressibility Phasicity Spontaneity Properties Thrombus Aging  +---------+---------------+---------+-----------+----------+--------------+  CFV       Full            No        No                                     +---------+---------------+---------+-----------+----------+--------------+  SFJ       Full                                                             +---------+---------------+---------+-----------+----------+--------------+  FV Prox   Full                                                             +---------+---------------+---------+-----------+----------+--------------+  FV Mid    Full                                                             +---------+---------------+---------+-----------+----------+--------------+  FV Distal Full                                                             +---------+---------------+---------+-----------+----------+--------------+  PFV       Full                                                             +---------+---------------+---------+-----------+----------+--------------+  POP       Full            No        No                     sluggish flow   +---------+---------------+---------+-----------+----------+--------------+  PTV       Full                                                             +---------+---------------+---------+-----------+----------+--------------+  PERO      Full                                                             +---------+---------------+---------+-----------+----------+--------------+   +---------+---------------+---------+-----------+----------+-----------------+  LEFT      Compressibility Phasicity Spontaneity Properties Thrombus Aging     +---------+---------------+---------+-----------+----------+-----------------+  CFV       Partial         Yes       Yes                     Age Indeterminate  +---------+---------------+---------+-----------+----------+-----------------+  SFJ       Full                                             Age Indeterminate  +---------+---------------+---------+-----------+----------+-----------------+  FV Prox   Partial                                          Age Indeterminate  +---------+---------------+---------+-----------+----------+-----------------+  FV Mid    Partial  Age Indeterminate  +---------+---------------+---------+-----------+----------+-----------------+  FV Distal Partial                                          Age Indeterminate  +---------+---------------+---------+-----------+----------+-----------------+  PFV       Full                                                                +---------+---------------+---------+-----------+----------+-----------------+  POP       Partial         Yes       No                     Age Indeterminate  +---------+---------------+---------+-----------+----------+-----------------+  PTV       Full                                                                +---------+---------------+---------+-----------+----------+-----------------+  PERO      Full                                                                +---------+---------------+---------+-----------+----------+-----------------+     Summary: RIGHT: - There is no evidence of deep vein thrombosis in the lower extremity.  sluggish flow noted in popliteal vein  LEFT: - Findings consistent with age indeterminate deep vein thrombosis involving the left common femoral vein, SF junction, left femoral vein, and left popliteal vein.  *See table(s) above for measurements and observations.    Preliminary      Assessment and Plan:   1. ASD: The findings on his echocardiogram are strongly suggestive of atrial septal defect, although on transthoracic echo the exact location of the defect cannot be  ascertained.  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  Before any contrast otherwise arrives to the left side of the heart, there is contrast visible in the right upper pulmonary vein that is probably shunting from the junction of the atrial septum with the superior vena cava.  Recommend transesophageal echocardiography to clarify this diagnosis.  It is possible that this has been a relatively small shunt over the years, which would explain his lack of symptoms.  Right to left shunting due to sudden increase in right ventricular afterload with pulmonary embolism can explain paradoxical embolism with renal infarction, as well as his remarkably severe hypoxemia on presentation..  If he does have a sinus stenosis ASD this is not amenable to device closure typically.  If deemed necessary, he would likely require a surgical ASD repair.  2. LV systolic dysfunction: This is more challenging to explain.  On my review LVEF is even lower than reported, around 30%. He has global LV  hypokinesis.  He does not have a history of angina pectoris and does not have visible coronary atherosclerosis/calcification on chest CT.  He does have some risk factors for CAD including smoking, prediabetes and a family history (albeit not at an early age).  We could assess this further with CT angiography versus invasive left heart catheterization, but I prefer the latter coupled with a right heart catheterization to better characterize the shunt through the ASD.  If he does not have CAD, differential diagnosis includes transient coronary embolism with myocardial stunning, alcoholic cardiomyopathy or even Takotsubo syndrome, even though the clinical tableau is certainly not typical for any of these. 3. Pulmonary embolism/acute cor pulmonale/RV systolic failure: Confirmed DVT of the left lower extremity.  I suspect that the chest trauma from the tree limbs has nothing to do with the pain  in his chest and that this was always due to acute pulmonary embolism.  On intravenous heparin.  Please defer initiation of oral anticoagulants until after cardiac catheterization.  Elevated transaminases and troponin could be explained by RV strain and his RV still appears severely depressed on today's echo..  Discussed TEE as well as R and L heart catheterization in detail Tentatively scheduled for TEE tomorrow at 1400h, but may not be able to schedule R/L heart cath until Wednesday.    This procedure has been fully reviewed with the patient and written informed consent has been obtained.   For questions or updates, please contact [place] Please consult [email or web address] for contact info under     Signed, Sanda Klein, MD  12/31/2019 12:46 PM

## 2019-12-31 NOTE — Progress Notes (Signed)
PROGRESS NOTE  Cody Joyce  DOB: 06/09/1961  PCP: Patient, No Pcp Per [id number]  DOA: 12/29/2019  LOS: 2 days   Chief Complaint  Patient presents with  . Shortness of Breath   Brief narrative: Cody Joyce is a 59 y.o. male with PMH of chronic everyday smoker, COPD, chronic alcoholism who had a tree branch fall on his right rib cage on 4/15, negative chest x-ray but since been less mobile. Patient presented to the ED on 12/29/2019 from home with complaint of shortness of breath, chest pain.  EMS noted oxygen saturation of 60% brought to the ED on NRB.  CT angio chest showed large bilateral pulmonary emboli with evidence of right heart strain and an adherent mural thrombus along the anterior wall of the RV with bilateral groundglass opacities likely developing pulmonary infarctions, underlying emphysema. CT abdomen pelvis showed multiple left renal cortical infarcts. Patient was admitted to critical care on IV heparin drip. 4/25, IR did Pulmonary angiogram with bilateral pulmonary artery suction thrombectomy of both pulmonary arteries.  Patient subsequently improved and was transferred to hospital service on 4/26.  Subjective: Patient was seen and examined this morning.  Middle-aged African-American male.  Looks older for his age.  On high flow oxygen by nasal cannula.  Daughter at bedside. Patient seems overwhelmed with the degree of severity of his problems.  Assessment/Plan: Submassive PE with acute cor pulmonale Acute respiratory failure with hypoxia -Presented with shortness of breath and chest pain following 7 to 10 days of limited mobility after chest trauma. -CT angio chest finding as above showing bilateral pulmonary embolism -4/25, IR did pulmonary angiogram with bilateral pulmonary artery suction thrombectomy of both pulmonary arteries.  -DVT scan showed left common femoral to left popliteal vein DVT. -Remains on IV heparin drip. -Oxygen requirement still remains  high, currently on-nasal cannula at 14 L/min.  Continue to wean down as tolerated.  New onset systolic CHF Significantly elevated troponin and BNP -Presented with troponin of more than 6000, BNP more than 1000.  -Cardiology consult appreciated.  Echo 4/25 showed EF decreased to 35 to 40% with global hypokinesis, dilated right heart chambers, moderate reduction in right ventricular function, pressure overload as well as volume overload.  Per cardiology note, clinical findings strongly suggestive of an atrial septal defect.  Patient is planned for TEE as well as left and right heart cath.   Acute kidney injury Multiple left renal cortical infarcts -Baseline creatinine from 2019 normal -Presented with a creatinine of 1.74.  CT abdomen found multiple left renal cortical infarcts -Creatinine remains elevated at 1.8 today. -Continue to monitor.  Leukocytosis -WBC count remains more than 20,000 throughout. -No fever.  No source of infection.  Continue to monitor.  Elevated liver enzymes -Likely because of alcohol use.  Continue to monitor -No evidence of liver cirrhosis and CT abdomen Recent Labs  Lab 12/29/19 1633 12/30/19 0620  AST 120* 109*  ALT 143* 145*  ALKPHOS 132* 113  BILITOT 1.6* 1.3*  PROT 6.8 6.8  ALBUMIN 2.3* 2.1*   Hyperglycemia/prediabetes -A1c 6.4 on 4/25 -Counseled for dietary control.  COPD Chronic everyday smoker -Counseled to quit smoking.  Chronic alcoholism -Not in withdrawal.   -Counseled to quit alcohol.  Mobility: Encourage ambulation Code Status:  Full code  DVT prophylaxis:  On IV heparin drip Antimicrobials:  None Fluid: None Diet: Modified carb diet  Consultants: PCCM, IR Family Communication:  Spoke with patient's daughter at bedside.  Status is: Inpatient  Remains inpatient appropriate because:Ongoing  diagnostic testing needed not appropriate for outpatient work up, IV treatments appropriate due to intensity of illness or inability to  take PO and Inpatient level of care appropriate due to severity of illness   Dispo: The patient is from: Home              Anticipated d/c is to: Home              Anticipated d/c date is: 3 days              Patient currently is not medically stable to d/c.   Antimicrobials: Anti-infectives (From admission, onward)   Start     Dose/Rate Route Frequency Ordered Stop   12/29/19 1815  cefTRIAXone (ROCEPHIN) 1 g in sodium chloride 0.9 % 100 mL IVPB  Status:  Discontinued     1 g 200 mL/hr over 30 Minutes Intravenous  Once 12/29/19 1800 12/29/19 1903   12/29/19 1815  azithromycin (ZITHROMAX) 500 mg in sodium chloride 0.9 % 250 mL IVPB  Status:  Discontinued     500 mg 250 mL/hr over 60 Minutes Intravenous  Once 12/29/19 1800 12/29/19 1903        Code Status: Full Code   Diet Order            Diet Carb Modified Fluid consistency: Thin; Room service appropriate? Yes  Diet effective now              Infusions:  . heparin 1,500 Units/hr (12/31/19 0113)    Scheduled Meds: . pantoprazole  40 mg Oral Daily    PRN meds: docusate sodium, ipratropium-albuterol, polyethylene glycol   Objective: Vitals:   12/31/19 0826 12/31/19 0837  BP: (!) 143/99   Pulse: 91   Resp: 20   Temp: 98.1 F (36.7 C)   SpO2: 96% (!) 89%    Intake/Output Summary (Last 24 hours) at 12/31/2019 0845 Last data filed at 12/31/2019 0600 Gross per 24 hour  Intake 500 ml  Output 650 ml  Net -150 ml   Filed Weights   12/29/19 1900 12/30/19 2033  Weight: 84.3 kg 79.3 kg   Weight change: -5.024 kg Body mass index is 23.07 kg/m.   Physical Exam: General exam: Appears calm and comfortable.  Skin: No rashes, lesions or ulcers. HEENT: Atraumatic, normocephalic, supple neck, no obvious bleeding Lungs: Clear to auscultation bilaterally CVS: Regular rate and rhythm, no murmur GI/Abd soft, nontender, nondistended, bowel sound present CNS: Alert, awake, oriented x3 Psychiatry: Mood  appropriate Extremities: No pedal edema, no calf tenderness  Data Review: I have personally reviewed the laboratory data and studies available.  Recent Labs  Lab 12/29/19 1633 12/29/19 2333 12/30/19 0620 12/31/19 0537  WBC 22.1*  --  20.8* 21.7*  NEUTROABS 17.5*  --   --   --   HGB 14.6 15.0 14.1 12.9*  HCT 43.3 44.0 41.2 38.1*  MCV 84.2  --  82.4 83.7  PLT 132*  --  120* 147*   Recent Labs  Lab 12/29/19 1633 12/29/19 2333 12/30/19 0620 12/31/19 0537  NA 142 139 143 141  K 5.4* 5.1 5.5* 4.7  CL 110  --  111 111  CO2 19*  --  21* 20*  GLUCOSE 160*  --  129* 143*  BUN 37*  --  43* 49*  CREATININE 1.74*  --  1.96* 1.82*  CALCIUM 8.7*  --  8.5* 7.9*  MG  --   --  2.6* 2.5*  PHOS  --   --  6.1* 3.6    Signed, Terrilee Croak, MD [facility] Pager: [phone] (Secure Chat preferred). 12/31/2019

## 2019-12-31 NOTE — Significant Event (Signed)
Rapid Response Event Note  Overview: Chest Pain  Initial Focused Assessment: Patient was in his usual state of health this evening, he was sitting up in the chair, after getting back to bed, patient stated he had mid substernal chest pressure - non radiating. He endorsed it was 8-9/10. No pain with inspiration. Skin warm and dry. VS - 125/88 (98) HR 132 - AFlutter, 99% on NRB 15L, RR 18-22 - good effort - lung sounds - clear throughout. Abdomen soft - non tender. Patient endorses having acid reflux and this afternoon has been belching more.   RN contacted [facility] MD. MD reviewed EKG per nurse. Patient stated he had a similar episode of this yesterday. His wife rubbed his lower chest/epigastric area and pain dissipated.   Interventions: -- STAT EKG - HR 130 - Atrial Flutter  -- STAT Troponin -- GI Cocktail -- Lopressor 2.5mg  IV x 1 -- Transitioned oxygen to VM 10L 45% from NRB 15L 100% -- Apply ice pack over epigastric region - pressure improved now 4/10   Plan of Care: -- F/U with troponin -- Monitor VS -- Reassess pain after GI cocktail   Event Summary:  Call Time 1817 Arrival Time 1820 End Time [place], [place]

## 2019-12-31 NOTE — Progress Notes (Addendum)
ANTICOAGULATION CONSULT NOTE  Pharmacy Consult for Heparin Indication: pulmonary embolus  No Known Allergies  Patient Measurements: Height: 6\' 1"  (185.4 cm) Weight: 79.3 kg (174 lb 13.2 oz) IBW/kg (Calculated) : 79.9 Heparin Dosing Weight: 79.3 kg  Vital Signs: Temp: 98.1 F (36.7 C) (04/26 0826) BP: 143/99 (04/26 0826) Pulse Rate: 91 (04/26 0826)  Labs: Recent Labs    12/29/19 1633 12/29/19 1633 12/29/19 2221 12/29/19 2221 12/29/19 2333 12/30/19 0620 12/30/19 1902 12/31/19 0537 12/31/19 1445  HGB 14.6   < >  --    < > 15.0 14.1  --  12.9*  --   HCT 43.3   < >  --   --  44.0 41.2  --  38.1*  --   PLT 132*  --   --   --   --  120*  --  147*  --   LABPROT  --   --   --   --   --   --  18.8*  --   --   INR  --   --   --   --   --   --  1.6*  --   --   HEPARINUNFRC  --   --  0.55   < >  --  0.31  --  0.20* 0.19*  CREATININE 1.74*  --   --   --   --  1.96*  --  1.82*  --   TROPONINIHS PO:[id number]*  --  6,642*  --   --   --   --   --   --    < > = values in this interval not displayed.    Estimated Creatinine Clearance: 49 mL/min (A) (by C-G formula based on SCr of 1.82 mg/dL (H)).  Assessment: 59 y.o. male with PE for heparin. Also noted to have left DVT.  Heparin level is sub-therapeutic at 0.19 on rate of 1750 units/hr which is slightly lower than last level. Spoke with RN and there have been no problems with the infusion or bleeding. She noted the heparin was off for 1 min for the bubble study but this is not clinically significant.    Goal of Therapy:  Heparin level 0.3-0.7 units/ml Monitor platelets by anticoagulation protocol: Yes   Plan:  Heparin 1200 units IV bolus then increase infusion to 1900 units/hr  6h heparin level Monitor daily heparin level, CBC, and for any signs of bleeding  Thank you for involving pharmacy in this patient's care.  Renold Genta, PharmD, BCPS Clinical Pharmacist Clinical phone for 12/31/2019 until 10p is [phone] 12/31/2019 3:41  PM  **Pharmacist phone directory can be found on [email or web address] listed under [place]**

## 2019-12-31 NOTE — Progress Notes (Signed)
VASCULAR LAB PRELIMINARY  PRELIMINARY  PRELIMINARY  PRELIMINARY  Bilateral lower extremity venous duplex completed.    Preliminary report:  See CV proc for preliminary results.   Kaynen Minner, RVT 12/31/2019, 11:47 AM

## 2019-12-31 NOTE — Consult Note (Signed)
Cardiology Consultation:   Patient ID: MHER JENTSCH MRN: [id number]; DOB: 1961/02/12  Admit date: 12/29/2019 Date of Consult: 12/31/2019  Primary Care Provider: Patient, No Pcp Per Primary Cardiologist: No primary care provider on file. NEW Primary Electrophysiologist:  None    Patient Profile:   Cody Joyce is a 59 y.o. male with a hx of smoking and alcohol use, but no major chronic illness, admitted with submassive pulmonary embolism who is being seen today for the evaluation of CHF at the request of Dr. Pietro Cassis.  History of Present Illness:   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  Approximately 10 days prior to admission he was loading a truck with tree limbs and 1 of these fell on his chest.  After that he had severe rib cage pain and shortness of breath.  He did not have cough or hemoptysis and denies leg swelling or tenderness.  On presentation he had severe hypoxemia, elevated troponin and BNP and lactic acidosis.  Large bilateral pulmonary emboli and evidence of right heart strain as well as mural thrombus along the anterior wall of the right ventricle were noted on CT.  He was also noted to have acute kidney injury with a creatinine of 1.7 and left renal cortical infarct.  He underwent (interventional radiology) suction thrombectomy.    An echocardiogram performed yesterday shows dilated right heart chambers as well as moderate reduction in right ventricular function, which might be expected with right heart pressure overload, but there was also evidence of right ventricular volume over load.  A saline contrast study performed today shows "negative contrast" in the right atrium consistent with a large left to right shunt across the atrial septum, but also moderate intermittent right to left shunt with saline  bubbles in the left heart.  His electrocardiogram shows sinus rhythm with biatrial enlargement, S1Q3T3 pattern, RSR' pattern in V1-V2 and T wave inversion in leads V2-V4 (which might all be explained by pulmonary embolism).  All these abnormalities are new when compared with ECGs from 2014 and 2019.  Lower extremity venous Doppler confirms deep vein thrombosis from the left common femoral to the left popliteal vein.  He is doing better and denies problems with dyspnea at rest.  He has not had chest pain with physical activity in the past or now.  He claims excellent functional status until the acute illness.  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  He has no trouble climbing stairs at a rapid pace.  He can "work harder than his 16 year old stepson".  His father had coronary disease in his 58s.  He checks his blood pressure at home and is usually 120/80.  He has never been diagnosed with diabetes, but hemoglobin A1c during this admission has been 6.3-6.4%.  We do not have a lipid profile.  Until a week ago he was smoking cigars.  He bears a diagnosis of emphysema.  He drinks a sixpack of beer a day.  He does not have a history of alcohol dependency or withdrawal.  There is no evidence of coronary atherosclerosis on CT of the chest and there was "minimal atherosclerosis" of the abdominal aorta on CT of the abdomen   Past Medical History:  Diagnosis Date  . Emphysema of lung [facility])     Past Surgical History:  Procedure Laterality Date  .  IR ANGIOGRAM PULMONARY BILATERAL SELECTIVE  12/30/2019  . IR ANGIOGRAM SELECTIVE EACH ADDITIONAL VESSEL  12/30/2019  . IR ANGIOGRAM SELECTIVE EACH ADDITIONAL VESSEL  12/30/2019  . IR THROMBECT PRIM MECH ADD (INCLU) MOD SED  12/30/2019  . IR THROMBECT PRIM MECH INIT (INCLU) MOD SED  12/30/2019  . IR US GUIDE VASC ACCESS RIGHT  12/30/2019     Home Medications:  Prior to Admission medications   Medication Sig Start Date End Date Taking?  Authorizing Provider  acetaminophen (TYLENOL) 500 MG tablet Take 1,000 mg by mouth every 6 (six) hours as needed for headache (pain).   Yes [provider]  albuterol (VENTOLIN HFA) 108 (90 Base) MCG/ACT inhaler Inhale 2 puffs into the lungs 4 (four) times daily.   Yes [provider]  diclofenac Sodium (VOLTAREN) 1 % GEL Apply 1 application topically 3 (three) times daily as needed (knee pain).   Yes [provider]  LIDOCAINE EX Apply 1 application topically daily as needed (pain).   Yes [provider]  Liniments (BLUE-EMU SUPER STRENGTH) CREA Apply 1 application topically daily as needed (pain).   Yes [provider]  Multiple Vitamin (MULTIVITAMIN WITH MINERALS) TABS tablet Take 1 tablet by mouth daily.   Yes [provider]  omeprazole (PRILOSEC) 20 MG capsule Take 20 mg by mouth 2 (two) times daily before a meal.   Yes [provider]    Inpatient Medications: Scheduled Meds: . pantoprazole  40 mg Oral Daily   Continuous Infusions: . heparin 1,750 Units/hr (12/31/19 0846)   PRN Meds: docusate sodium, ipratropium-albuterol, polyethylene glycol  Allergies:   No Known Allergies  Social History:   Social History   Socioeconomic History  . Marital status: Married    Spouse name: Not on file  . Number of children: Not on file  . Years of education: Not on file  . Highest education level: Not on file  Occupational History  . Not on file  Tobacco Use  . Smoking status: Light Tobacco Smoker  . Smokeless tobacco: Never Used  Substance and Sexual Activity  . Alcohol use: Yes    Alcohol/week: 6.0 standard drinks    Types: 2 Glasses of wine, 4 Cans of beer per week  . Drug use: No  . Sexual activity: Yes  Other Topics Concern  . Not on file  Social History Narrative   Marital status:  Married x 3 years; not happily married.      Children:  None      Lives: alone.      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      Tobacco: smoke cigars 2 per day.  Quit cigarettes in 2001; smoked x 15 years.      Alcohol:  Weekends 4 beers per week; 2 drinks per week.      Drugs:  None      Exercising:  Three days per week; active job.        Seatbelt:  100%      Guns:  None      Sexual activity: condoms; Gonorrhea in high school.  One partner in past year.  Last STD screening 2013.   Social Determinants of Health   Financial Resource Strain:   . Difficulty of Paying Living Expenses:   Food Insecurity:   . Worried About Charity fundraiser in the Last Year:   . Arboriculturist in the Last Year:   Transportation Needs:   .  Lack of Transportation (Medical):   [name] Lack of Transportation (Non-Medical):   Physical Activity:   . Days of Exercise per Week:   . Minutes of Exercise per Session:   Stress:   . Feeling of Stress :   Social Connections:   . Frequency of Communication with Friends and Family:   . Frequency of Social Gatherings with Friends and Family:   . Attends Religious Services:   . Active Member of Clubs or Organizations:   . Attends Archivist Meetings:   [name] Marital Status:   Intimate Partner Violence:   . Fear of Current or Ex-Partner:   . Emotionally Abused:   [name] Physically Abused:   . Sexually Abused:     Family History:    Family History  Problem Relation Age of Onset  . Glaucoma Mother   . Heart disease Father        stents/CAD/AMI age 41s.  . Glaucoma Father      ROS:  Please see the history of present illness.   All other ROS reviewed and negative.     Physical Exam/Data:   Vitals:   12/30/19 2026 12/30/19 2033 12/31/19 0826 12/31/19 0837  BP: 124/85  (!) 143/99   Pulse: 61  91   Resp:   20   Temp: 98.2 F (36.8 C)  98.1 F (36.7 C)   TempSrc: Axillary     SpO2: 91%  96% (!) 89%  Weight:  79.3 kg    Height:  6\' 1"  (1.854 m)      Intake/Output Summary (Last 24 hours) at 12/31/2019 1246 Last data filed at 12/31/2019 0600 Gross per 24 hour   Intake 500 ml  Output 650 ml  Net -150 ml   Last 3 Weights 12/30/2019 12/29/2019 08/09/2018  Weight (lbs) 174 lb 13.2 oz 185 lb 14.4 oz 180 lb  Weight (kg) 79.3 kg 84.324 kg 81.647 kg     Body mass index is 23.07 kg/m.  General:  Well nourished, well developed, in no acute distress HEENT: normal Lymph: no adenopathy Neck: no JVD Endocrine:  No thryomegaly Vascular: No carotid bruits; FA pulses 2+ bilaterally without bruits  Cardiac:  normal S1, widely split S2; RRR; no murmur  Lungs:  clear to auscultation bilaterally, no wheezing, rhonchi or rales  Abd: soft, nontender, no hepatomegaly  Ext: no edema Musculoskeletal:  No deformities, BUE and BLE strength normal and equal Skin: warm and dry  Neuro:  CNs 2-12 intact, no focal abnormalities noted Psych:  Normal affect, but becomes very emotional when discussing his health problems  EKG:  The EKG was personally reviewed and demonstrates: Sinus rhythm, biatrial abnormality, RSR' V1-V2, S1Q3T3 pattern, T wave inversion V2-V4 (all abnormalities new compared to 2019). Telemetry:  Telemetry was personally reviewed and demonstrates: Sinus rhythm with a couple of brief episodes of paroxysmal atrial tachycardia hello  Relevant CV Studies: Echocardiogram 12-30-2019 1. Left ventricular ejection fraction, by estimation, is 35 to 40%. The  left ventricle has moderately decreased function. The left ventricle  demonstrates global hypokinesis. Left ventricular diastolic parameters are  consistent with Grade I diastolic  dysfunction (impaired relaxation). There is the interventricular septum is  flattened in systole and diastole, consistent with right ventricular  pressure and volume overload.  2. Right ventricular systolic function is moderately reduced.  3. Right atrial size was moderately dilated.  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 Trivial mitral valve regurgitation.  5. Unable to obtain adequate Doppler signal to estimate pulmonary  pressures. Tricuspid valve regurgitation is trivial.   Limited echo saline contrast study today (my review) shows evidence of atrial septal defect with primarily left to right shunt (negative contrast) but also with moderate amount of right to left shunt during quiet breathing  Laboratory Data:  High Sensitivity Troponin:   Recent Labs  Lab 12/29/19 1633 12/29/19 2221  TROPONINIHS 4,863* 6,642*     Chemistry Recent Labs  Lab 12/29/19 1633 12/29/19 1633 12/29/19 2333 12/30/19 0620 12/31/19 0537  NA 142   < > 139 143 141  K 5.4*   < > 5.1 5.5* 4.7  CL 110  --   --  111 111  CO2 19*  --   --  21* 20*  GLUCOSE 160*  --   --  129* 143*  BUN 37*  --   --  43* 49*  CREATININE 1.74*  --   --  1.96* 1.82*  CALCIUM 8.7*  --   --  8.5* 7.9*  GFRNONAA 42*  --   --  36* 40*  GFRAA 49*  --   --  42* 46*  ANIONGAP 13  --   --  11 10   < > = values in this interval not displayed.    Recent Labs  Lab 12/29/19 1633 12/30/19 0620  PROT 6.8 6.8  ALBUMIN 2.3* 2.1*  AST 120* 109*  ALT 143* 145*  ALKPHOS 132* 113  BILITOT 1.6* 1.3*   Hematology Recent Labs  Lab 12/29/19 1633 12/29/19 1633 12/29/19 2333 12/30/19 0620 12/31/19 0537  WBC 22.1*  --   --  20.8* 21.7*  RBC 5.14  --   --  5.00 4.55  HGB 14.6   < > 15.0 14.1 12.9*  HCT 43.3   < > 44.0 41.2 38.1*  MCV 84.2  --   --  82.4 83.7  MCH 28.4  --   --  28.2 28.4  MCHC 33.7  --   --  34.2 33.9  RDW 16.5*  --   --  16.2* 16.4*  PLT 132*  --   --  120* 147*   < > = values in this interval not displayed.   BNP Recent Labs  Lab 12/29/19 1633  BNP 1,086.8*    DDimer  Recent Labs  Lab 12/29/19 1633  DDIMER >20.00*     Radiology/Studies:  CT Angio Chest PE W and/or Wo Contrast  Result Date: 12/29/2019 CLINICAL DATA:  Short of breath, right-sided chest and rib pain, recent history of trauma EXAM: CT ANGIOGRAPHY CHEST WITH CONTRAST TECHNIQUE: Multidetector CT imaging of the chest was performed using the standard  protocol during bolus administration of intravenous contrast. Multiplanar CT image reconstructions and MIPs were obtained to evaluate the vascular anatomy. CONTRAST:  165mL OMNIPAQUE IOHEXOL 350 MG/ML SOLN COMPARISON:  None. FINDINGS: Cardiovascular: This is a technically adequate evaluation of the pulmonary vasculature. There are large bilateral pulmonary emboli filling the main pulmonary arteries. There is straightening of the interventricular septum and mild dilation of the right ventricle, with RV/LV ratio measuring 1.3. Along the nondependent surface of the right ventral, actually better visualized on the corresponding abdominal CT, there is likely a here mural thrombus. Echocardiography may be useful for further evaluation. No pericardial effusion. The thoracic aorta is normal in caliber without aneurysm or dissection. Mediastinum/Nodes: No enlarged mediastinal, hilar, or axillary lymph nodes. Thyroid gland, trachea, and esophagus demonstrate no significant findings. Lungs/Pleura: There is background emphysema. Bilateral areas of airspace disease are seen within the  dependent upper lobes and superior segment of the bilateral lower lobes. Ground-glass opacities are seen within the dependent lower lobes. No effusion or pneumothorax. Central airways are patent. Upper Abdomen: No acute abnormality. Musculoskeletal: No acute or destructive bony lesions. Reconstructed images demonstrate no additional findings. Review of the MIP images confirms the above findings. IMPRESSION: 1. Large bilateral pulmonary emboli with CT evidence of right heart strain (RV/LV Ratio = 1.3) consistent with at least submassive (intermediate risk) PE. The presence of right heart strain has been associated with an increased risk of morbidity and mortality. 2. Likely adherent mural thrombus along the anterior wall the right ventricle, better visualized on the corresponding abdominal CT. 3. Bilateral areas of consolidation and ground-glass  airspace disease, superimposed upon background emphysema. Findings favor pulmonary edema with developing pulmonary infarctions. 4.  Emphysema (ICD10-J43.9). These results were called by telephone at the time of interpretation on 12/29/2019 at 7:07 pm to provider Dr. Melina Copa, who verbally acknowledged these results. Electronically Signed   By: Randa Ngo M.D.   On: 12/29/2019 19:08   CT ABDOMEN PELVIS W CONTRAST  Result Date: 12/29/2019 CLINICAL DATA:  History of abdominal trauma, shortness of breath, right upper quadrant pain EXAM: CT ABDOMEN AND PELVIS WITH CONTRAST TECHNIQUE: Multidetector CT imaging of the abdomen and pelvis was performed using the standard protocol following bolus administration of intravenous contrast. CONTRAST:  167mL OMNIPAQUE IOHEXOL 350 MG/ML SOLN COMPARISON:  None. FINDINGS: Lower chest: Large bilateral pulmonary emboli are seen. Diffuse ground-glass opacities throughout the lung bases. Please refer to corresponding CT chest report for important discussion of these findings. Hepatobiliary: No hepatic injury or perihepatic hematoma. Gallbladder is unremarkable Pancreas: Unremarkable. No pancreatic ductal dilatation or surrounding inflammatory changes. Spleen: No splenic injury or perisplenic hematoma. Adrenals/Urinary Tract: There are multiple wedge-shaped hypodensities within the left kidney consistent with renal infarcts. There is no surrounding fluid or perinephric fat stranding to suggest posttraumatic etiology. The right kidney enhances normally. The adrenals are unremarkable. Bladder is grossly normal. Stomach/Bowel: No bowel obstruction or ileus. Normal appendix right lower quadrant. No bowel wall thickening or inflammatory change. Vascular/Lymphatic: Minimal atherosclerosis of the abdominal aorta. The visualized vascular structures opacify normally. No pathologic adenopathy. Reproductive: Prostate is unremarkable. Other: No free fluid or free gas. No abdominal wall hernia. No  evidence of soft tissue injury related to recent trauma. Musculoskeletal: No acute or destructive bony lesions. Reconstructed images demonstrate no additional findings. IMPRESSION: 1. Multiple left renal cortical infarcts. 2. Large bilateral pulmonary emboli. Please refer to corresponding CT chest report for important discussion of these findings. 3. No evidence of intra-abdominal or intrapelvic trauma. 4. Aortic Atherosclerosis (ICD10-I70.0). These results were called by telephone at the time of interpretation on 12/29/2019 at 7:03 pm to provider Dr. Melina Copa, who verbally acknowledged these results. Electronically Signed   By: Randa Ngo M.D.   On: 12/29/2019 19:08   IR Angiogram Pulmonary Bilateral Selective  Result Date: 12/30/2019 EXAM: ADDITIONAL ARTERIOGRAPHY; IR ULTRASOUND GUIDANCE VASC ACCESS RIGHT; THROMBECTOMY MECHANICAL; BILATERAL PULMONARY ARTERIOGRAPHY COMPARISON:  CT a chest 12/29/2019 MEDICATIONS: 10000 units heparin ANESTHESIA/SEDATION: Versed 2.5 mg IV; Fentanyl 125 mcg IV Moderate Sedation Time:  107 minutes The patient was continuously monitored during the procedure by the interventional radiology nurse under my direct supervision. FLUOROSCOPY TIME:  Fluoroscopy Time: 28 minutes 54 seconds (344 mGy). COMPLICATIONS: None immediate. TECHNIQUE: Informed written consent was obtained from the patient after a thorough discussion of the procedural risks, benefits and alternatives. All questions were addressed. Maximal Sterile Barrier Technique  was utilized including caps, mask, sterile gowns, sterile gloves, sterile drape, hand hygiene and skin antiseptic. A timeout was performed prior to the initiation of the procedure. The right common femoral vein was interrogated with ultrasound and found to be widely patent. An image was obtained and stored for the medical record. Local anesthesia was attained by infiltration with 1% lidocaine. A small dermatotomy was made. Under real-time sonographic  guidance, the vessel was punctured with a 21 gauge micropuncture needle. Using standard technique, the initial micro needle was exchanged over a 0.018 micro wire for a transitional 4 [place] micro sheath. The micro sheath was then exchanged over a 0.035 wire for a 6 French 11 cm vascular sheath. A right iliac venogram was performed through the sheath. No evidence of iliac or caval DVT. An angled pigtail catheter was advanced over a Bentson wire and navigated through the heart and into the main pulmonary artery. The main pulmonary arterial pressure was measured at 42/28 (mean 28) mm Hg. The catheter was navigated into the right main pulmonary artery. Pulmonary arteriography was performed. There is large volume thrombus within the right main pulmonary artery extending into the right lower lobe and segmental pulmonary arteries. Very poor overall perfusion of the right middle and lower lobes. There is adequate perfusion of the right upper lobe. The pigtail catheter was exchanged over a rose in wire for an angled catheter. The angled catheter was successfully navigated into a right lower lobe peripheral branch. Pulmonary arteriography was performed confirming catheter location and that the vessel was of adequate size. An Amplatz wire was advanced. The catheter was removed. The 24 [place] In[place] aspiration catheter was then successfully advanced over the wire and positioned in the right main pulmonary artery. Suction aspiration was performed. There was extirpation of thrombus material from the right lower lobe pulmonary artery. Follow-up pulmonary arteriography demonstrates persistent and likely chronic thrombus adherent to the inferior wall of the right main pulmonary artery. The Nitinol discs were advanced through the catheter and used to agitated the residual chronic thrombus. This was performed twice. Additional aspiration was performed with extirpation of additional thrombus. Follow-up venography demonstrates improved  flow into the right lower lobe pulmonary artery with improved parenchymal blush, particularly in the right lower lobe. There is still persistent residual chronic thrombus along the inferior wall of the right lower lobe pulmonary artery. The aspiration catheter was brought back into the main pulmonary artery. The angled catheter was introduced coaxially in used to select the left main pulmonary artery. Arteriography was performed. There is large volume nearly occlusive thrombus in the left main pulmonary artery extending into the left lower pulmonary artery. The angled catheter was successfully advanced into a peripheral branch of the left lower lobe pulmonary artery. Arteriography was performed again confirming catheter location and terminal pulmonary arterial size. The Amplatz wire was advanced into the artery. The catheter was removed. The 27 French aspiration catheter was then advanced over the wire. Aspiration was performed without successful thrombus retrieval. Therefore, the 16 French aspiration catheter was advanced coaxially through the 24 French catheter and used to engage the thrombus. Aspiration was performed with successful extirpation of acute and chronic thrombus material. Follow-up arteriography demonstrates improved flow in the lower lobe pulmonary artery with increased parenchymal blush. There is some residual chronic thrombus remaining. However, at this point in the case, blood loss was approximately 500 mL. Repeat pulmonary arterial pressures were obtained. The mean main pulmonary arterial pressure has decreased 24 mm Hg. Patient is doing well  and hemodynamically stable. The decision was made to pursue no further attempts at extirpation of the residual chronic thrombus. FINDINGS: Pre thrombectomy main mean pulmonary arterial pressure 28 mm Hg. Post extirpation mean pulmonary arterial pressure 24 mm Hg. IMPRESSION: 1. Large volume bilateral pulmonary emboli with resultant pulmonary arterial  hypertension. 2. Successful extirpation of acute and chronic thrombus material from the bilateral main and lower lobe pulmonary arteries. 3. Improved pulmonary arterial hypertension. Signed, Criselda Peaches, MD, [employer] [facility] Radiology Electronically Signed   By: Jacqulynn Cadet M.D.   On: 12/30/2019 15:41   IR Angiogram Selective Each Additional Vessel  Result Date: 12/30/2019 EXAM: ADDITIONAL ARTERIOGRAPHY; IR ULTRASOUND GUIDANCE VASC ACCESS RIGHT; THROMBECTOMY MECHANICAL; BILATERAL PULMONARY ARTERIOGRAPHY COMPARISON:  CT a chest 12/29/2019 MEDICATIONS: 10000 units heparin ANESTHESIA/SEDATION: Versed 2.5 mg IV; Fentanyl 125 mcg IV Moderate Sedation Time:  107 minutes The patient was continuously monitored during the procedure by the interventional radiology nurse under my direct supervision. FLUOROSCOPY TIME:  Fluoroscopy Time: 28 minutes 54 seconds (344 mGy). COMPLICATIONS: None immediate. TECHNIQUE: Informed written consent was obtained from the patient after a thorough discussion of the procedural risks, benefits and alternatives. All questions were addressed. Maximal Sterile Barrier Technique was utilized including caps, mask, sterile gowns, sterile gloves, sterile drape, hand hygiene and skin antiseptic. A timeout was performed prior to the initiation of the procedure. The right common femoral vein was interrogated with ultrasound and found to be widely patent. An image was obtained and stored for the medical record. Local anesthesia was attained by infiltration with 1% lidocaine. A small dermatotomy was made. Under real-time sonographic guidance, the vessel was punctured with a 21 gauge micropuncture needle. Using standard technique, the initial micro needle was exchanged over a 0.018 micro wire for a transitional 4 [place] micro sheath. The micro sheath was then exchanged over a 0.035 wire for a 6 French 11 cm vascular sheath. A right iliac  venogram was performed through the sheath. No evidence of iliac or caval DVT. An angled pigtail catheter was advanced over a Bentson wire and navigated through the heart and into the main pulmonary artery. The main pulmonary arterial pressure was measured at 42/28 (mean 28) mm Hg. The catheter was navigated into the right main pulmonary artery. Pulmonary arteriography was performed. There is large volume thrombus within the right main pulmonary artery extending into the right lower lobe and segmental pulmonary arteries. Very poor overall perfusion of the right middle and lower lobes. There is adequate perfusion of the right upper lobe. The pigtail catheter was exchanged over a rose in wire for an angled catheter. The angled catheter was successfully navigated into a right lower lobe peripheral branch. Pulmonary arteriography was performed confirming catheter location and that the vessel was of adequate size. An Amplatz wire was advanced. The catheter was removed. The 24 [place] Inari aspiration catheter was then successfully advanced over the wire and positioned in the right main pulmonary artery. Suction aspiration was performed. There was extirpation of thrombus material from the right lower lobe pulmonary artery. Follow-up pulmonary arteriography demonstrates persistent and likely chronic thrombus adherent to the inferior wall of the right main pulmonary artery. The Nitinol discs were advanced through the catheter and used to agitated the residual chronic thrombus. This was performed twice. Additional aspiration was performed with extirpation of additional thrombus. Follow-up venography demonstrates improved flow into the right lower lobe pulmonary artery with improved parenchymal blush, particularly in the right lower lobe. There is still  persistent residual chronic thrombus along the inferior wall of the right lower lobe pulmonary artery. The aspiration catheter was brought back into the main pulmonary artery.  The angled catheter was introduced coaxially in used to select the left main pulmonary artery. Arteriography was performed. There is large volume nearly occlusive thrombus in the left main pulmonary artery extending into the left lower pulmonary artery. The angled catheter was successfully advanced into a peripheral branch of the left lower lobe pulmonary artery. Arteriography was performed again confirming catheter location and terminal pulmonary arterial size. The Amplatz wire was advanced into the artery. The catheter was removed. The 36 French aspiration catheter was then advanced over the wire. Aspiration was performed without successful thrombus retrieval. Therefore, the 16 French aspiration catheter was advanced coaxially through the 24 French catheter and used to engage the thrombus. Aspiration was performed with successful extirpation of acute and chronic thrombus material. Follow-up arteriography demonstrates improved flow in the lower lobe pulmonary artery with increased parenchymal blush. There is some residual chronic thrombus remaining. However, at this point in the case, blood loss was approximately 500 mL. Repeat pulmonary arterial pressures were obtained. The mean main pulmonary arterial pressure has decreased 24 mm Hg. Patient is doing well and hemodynamically stable. The decision was made to pursue no further attempts at extirpation of the residual chronic thrombus. FINDINGS: Pre thrombectomy main mean pulmonary arterial pressure 28 mm Hg. Post extirpation mean pulmonary arterial pressure 24 mm Hg. IMPRESSION: 1. Large volume bilateral pulmonary emboli with resultant pulmonary arterial hypertension. 2. Successful extirpation of acute and chronic thrombus material from the bilateral main and lower lobe pulmonary arteries. 3. Improved pulmonary arterial hypertension. Signed, Criselda Peaches, MD, [place] [facility] Radiology Electronically Signed    By: Jacqulynn Cadet M.D.   On: 12/30/2019 15:41   IR Angiogram Selective Each Additional Vessel  Result Date: 12/30/2019 EXAM: ADDITIONAL ARTERIOGRAPHY; IR ULTRASOUND GUIDANCE VASC ACCESS RIGHT; THROMBECTOMY MECHANICAL; BILATERAL PULMONARY ARTERIOGRAPHY COMPARISON:  CT a chest 12/29/2019 MEDICATIONS: 10000 units heparin ANESTHESIA/SEDATION: Versed 2.5 mg IV; Fentanyl 125 mcg IV Moderate Sedation Time:  107 minutes The patient was continuously monitored during the procedure by the interventional radiology nurse under my direct supervision. FLUOROSCOPY TIME:  Fluoroscopy Time: 28 minutes 54 seconds (344 mGy). COMPLICATIONS: None immediate. TECHNIQUE: Informed written consent was obtained from the patient after a thorough discussion of the procedural risks, benefits and alternatives. All questions were addressed. Maximal Sterile Barrier Technique was utilized including caps, mask, sterile gowns, sterile gloves, sterile drape, hand hygiene and skin antiseptic. A timeout was performed prior to the initiation of the procedure. The right common femoral vein was interrogated with ultrasound and found to be widely patent. An image was obtained and stored for the medical record. Local anesthesia was attained by infiltration with 1% lidocaine. A small dermatotomy was made. Under real-time sonographic guidance, the vessel was punctured with a 21 gauge micropuncture needle. Using standard technique, the initial micro needle was exchanged over a 0.018 micro wire for a transitional 4 [place] micro sheath. The micro sheath was then exchanged over a 0.035 wire for a 6 French 11 cm vascular sheath. A right iliac venogram was performed through the sheath. No evidence of iliac or caval DVT. An angled pigtail catheter was advanced over a Bentson wire and navigated through the heart and into the main pulmonary artery. The main pulmonary arterial pressure was measured at 42/28 (mean 28) mm Hg. The catheter was navigated into the  right main pulmonary artery. Pulmonary arteriography was performed. There is large volume thrombus within the right main pulmonary artery extending into the right lower lobe and segmental pulmonary arteries. Very poor overall perfusion of the right middle and lower lobes. There is adequate perfusion of the right upper lobe. The pigtail catheter was exchanged over a rose in wire for an angled catheter. The angled catheter was successfully navigated into a right lower lobe peripheral branch. Pulmonary arteriography was performed confirming catheter location and that the vessel was of adequate size. An Amplatz wire was advanced. The catheter was removed. The 24 [place] Inari aspiration catheter was then successfully advanced over the wire and positioned in the right main pulmonary artery. Suction aspiration was performed. There was extirpation of thrombus material from the right lower lobe pulmonary artery. Follow-up pulmonary arteriography demonstrates persistent and likely chronic thrombus adherent to the inferior wall of the right main pulmonary artery. The Nitinol discs were advanced through the catheter and used to agitated the residual chronic thrombus. This was performed twice. Additional aspiration was performed with extirpation of additional thrombus. Follow-up venography demonstrates improved flow into the right lower lobe pulmonary artery with improved parenchymal blush, particularly in the right lower lobe. There is still persistent residual chronic thrombus along the inferior wall of the right lower lobe pulmonary artery. The aspiration catheter was brought back into the main pulmonary artery. The angled catheter was introduced coaxially in used to select the left main pulmonary artery. Arteriography was performed. There is large volume nearly occlusive thrombus in the left main pulmonary artery extending into the left lower pulmonary artery. The angled catheter was successfully advanced into a peripheral  branch of the left lower lobe pulmonary artery. Arteriography was performed again confirming catheter location and terminal pulmonary arterial size. The Amplatz wire was advanced into the artery. The catheter was removed. The 53 French aspiration catheter was then advanced over the wire. Aspiration was performed without successful thrombus retrieval. Therefore, the 16 French aspiration catheter was advanced coaxially through the 24 French catheter and used to engage the thrombus. Aspiration was performed with successful extirpation of acute and chronic thrombus material. Follow-up arteriography demonstrates improved flow in the lower lobe pulmonary artery with increased parenchymal blush. There is some residual chronic thrombus remaining. However, at this point in the case, blood loss was approximately 500 mL. Repeat pulmonary arterial pressures were obtained. The mean main pulmonary arterial pressure has decreased 24 mm Hg. Patient is doing well and hemodynamically stable. The decision was made to pursue no further attempts at extirpation of the residual chronic thrombus. FINDINGS: Pre thrombectomy main mean pulmonary arterial pressure 28 mm Hg. Post extirpation mean pulmonary arterial pressure 24 mm Hg. IMPRESSION: 1. Large volume bilateral pulmonary emboli with resultant pulmonary arterial hypertension. 2. Successful extirpation of acute and chronic thrombus material from the bilateral main and lower lobe pulmonary arteries. 3. Improved pulmonary arterial hypertension. Signed, Criselda Peaches, MD, [facility] Radiology Electronically Signed   By: Jacqulynn Cadet M.D.   On: 12/30/2019 15:41   IR THROMBECT PRIM MECH INIT (INCLU) MOD SED  Result Date: 12/30/2019 EXAM: ADDITIONAL ARTERIOGRAPHY; IR ULTRASOUND GUIDANCE VASC ACCESS RIGHT; THROMBECTOMY MECHANICAL; BILATERAL PULMONARY ARTERIOGRAPHY COMPARISON:  CT a chest 12/29/2019 MEDICATIONS: 10000 units  heparin ANESTHESIA/SEDATION: Versed 2.5 mg IV; Fentanyl 125 mcg IV Moderate Sedation Time:  107 minutes The patient was continuously monitored during the procedure by the interventional radiology nurse under my direct supervision. FLUOROSCOPY TIME:  Fluoroscopy  Time: 28 minutes 54 seconds (344 mGy). COMPLICATIONS: None immediate. TECHNIQUE: Informed written consent was obtained from the patient after a thorough discussion of the procedural risks, benefits and alternatives. All questions were addressed. Maximal Sterile Barrier Technique was utilized including caps, mask, sterile gowns, sterile gloves, sterile drape, hand hygiene and skin antiseptic. A timeout was performed prior to the initiation of the procedure. The right common femoral vein was interrogated with ultrasound and found to be widely patent. An image was obtained and stored for the medical record. Local anesthesia was attained by infiltration with 1% lidocaine. A small dermatotomy was made. Under real-time sonographic guidance, the vessel was punctured with a 21 gauge micropuncture needle. Using standard technique, the initial micro needle was exchanged over a 0.018 micro wire for a transitional 4 [place] micro sheath. The micro sheath was then exchanged over a 0.035 wire for a 6 French 11 cm vascular sheath. A right iliac venogram was performed through the sheath. No evidence of iliac or caval DVT. An angled pigtail catheter was advanced over a Bentson wire and navigated through the heart and into the main pulmonary artery. The main pulmonary arterial pressure was measured at 42/28 (mean 28) mm Hg. The catheter was navigated into the right main pulmonary artery. Pulmonary arteriography was performed. There is large volume thrombus within the right main pulmonary artery extending into the right lower lobe and segmental pulmonary arteries. Very poor overall perfusion of the right middle and lower lobes. There is adequate perfusion of the right upper  lobe. The pigtail catheter was exchanged over a rose in wire for an angled catheter. The angled catheter was successfully navigated into a right lower lobe peripheral branch. Pulmonary arteriography was performed confirming catheter location and that the vessel was of adequate size. An Amplatz wire was advanced. The catheter was removed. The 24 [place] Inari aspiration catheter was then successfully advanced over the wire and positioned in the right main pulmonary artery. Suction aspiration was performed. There was extirpation of thrombus material from the right lower lobe pulmonary artery. Follow-up pulmonary arteriography demonstrates persistent and likely chronic thrombus adherent to the inferior wall of the right main pulmonary artery. The Nitinol discs were advanced through the catheter and used to agitated the residual chronic thrombus. This was performed twice. Additional aspiration was performed with extirpation of additional thrombus. Follow-up venography demonstrates improved flow into the right lower lobe pulmonary artery with improved parenchymal blush, particularly in the right lower lobe. There is still persistent residual chronic thrombus along the inferior wall of the right lower lobe pulmonary artery. The aspiration catheter was brought back into the main pulmonary artery. The angled catheter was introduced coaxially in used to select the left main pulmonary artery. Arteriography was performed. There is large volume nearly occlusive thrombus in the left main pulmonary artery extending into the left lower pulmonary artery. The angled catheter was successfully advanced into a peripheral branch of the left lower lobe pulmonary artery. Arteriography was performed again confirming catheter location and terminal pulmonary arterial size. The Amplatz wire was advanced into the artery. The catheter was removed. The 12 French aspiration catheter was then advanced over the wire. Aspiration was performed without  successful thrombus retrieval. Therefore, the 16 French aspiration catheter was advanced coaxially through the 24 French catheter and used to engage the thrombus. Aspiration was performed with successful extirpation of acute and chronic thrombus material. Follow-up arteriography demonstrates improved flow in the lower lobe pulmonary artery with increased parenchymal blush. There is some  residual chronic thrombus remaining. However, at this point in the case, blood loss was approximately 500 mL. Repeat pulmonary arterial pressures were obtained. The mean main pulmonary arterial pressure has decreased 24 mm Hg. Patient is doing well and hemodynamically stable. The decision was made to pursue no further attempts at extirpation of the residual chronic thrombus. FINDINGS: Pre thrombectomy main mean pulmonary arterial pressure 28 mm Hg. Post extirpation mean pulmonary arterial pressure 24 mm Hg. IMPRESSION: 1. Large volume bilateral pulmonary emboli with resultant pulmonary arterial hypertension. 2. Successful extirpation of acute and chronic thrombus material from the bilateral main and lower lobe pulmonary arteries. 3. Improved pulmonary arterial hypertension. Signed, Criselda Peaches, MD, [place] [facility] Radiology Electronically Signed   By: Jacqulynn Cadet M.D.   On: 12/30/2019 15:41   IR THROMBECT PRIM MECH ADD (INCLU) MOD SED  Result Date: 12/30/2019 EXAM: ADDITIONAL ARTERIOGRAPHY; IR ULTRASOUND GUIDANCE VASC ACCESS RIGHT; THROMBECTOMY MECHANICAL; BILATERAL PULMONARY ARTERIOGRAPHY COMPARISON:  CT a chest 12/29/2019 MEDICATIONS: 10000 units heparin ANESTHESIA/SEDATION: Versed 2.5 mg IV; Fentanyl 125 mcg IV Moderate Sedation Time:  107 minutes The patient was continuously monitored during the procedure by the interventional radiology nurse under my direct supervision. FLUOROSCOPY TIME:  Fluoroscopy Time: 28 minutes 54 seconds (344 mGy). COMPLICATIONS: None  immediate. TECHNIQUE: Informed written consent was obtained from the patient after a thorough discussion of the procedural risks, benefits and alternatives. All questions were addressed. Maximal Sterile Barrier Technique was utilized including caps, mask, sterile gowns, sterile gloves, sterile drape, hand hygiene and skin antiseptic. A timeout was performed prior to the initiation of the procedure. The right common femoral vein was interrogated with ultrasound and found to be widely patent. An image was obtained and stored for the medical record. Local anesthesia was attained by infiltration with 1% lidocaine. A small dermatotomy was made. Under real-time sonographic guidance, the vessel was punctured with a 21 gauge micropuncture needle. Using standard technique, the initial micro needle was exchanged over a 0.018 micro wire for a transitional 4 [place] micro sheath. The micro sheath was then exchanged over a 0.035 wire for a 6 French 11 cm vascular sheath. A right iliac venogram was performed through the sheath. No evidence of iliac or caval DVT. An angled pigtail catheter was advanced over a Bentson wire and navigated through the heart and into the main pulmonary artery. The main pulmonary arterial pressure was measured at 42/28 (mean 28) mm Hg. The catheter was navigated into the right main pulmonary artery. Pulmonary arteriography was performed. There is large volume thrombus within the right main pulmonary artery extending into the right lower lobe and segmental pulmonary arteries. Very poor overall perfusion of the right middle and lower lobes. There is adequate perfusion of the right upper lobe. The pigtail catheter was exchanged over a rose in wire for an angled catheter. The angled catheter was successfully navigated into a right lower lobe peripheral branch. Pulmonary arteriography was performed confirming catheter location and that the vessel was of adequate size. An Amplatz wire was advanced. The  catheter was removed. The 24 [place] Inari aspiration catheter was then successfully advanced over the wire and positioned in the right main pulmonary artery. Suction aspiration was performed. There was extirpation of thrombus material from the right lower lobe pulmonary artery. Follow-up pulmonary arteriography demonstrates persistent and likely chronic thrombus adherent to the inferior wall of the right main pulmonary artery. The Nitinol discs were advanced through the catheter and used to agitated the residual  chronic thrombus. This was performed twice. Additional aspiration was performed with extirpation of additional thrombus. Follow-up venography demonstrates improved flow into the right lower lobe pulmonary artery with improved parenchymal blush, particularly in the right lower lobe. There is still persistent residual chronic thrombus along the inferior wall of the right lower lobe pulmonary artery. The aspiration catheter was brought back into the main pulmonary artery. The angled catheter was introduced coaxially in used to select the left main pulmonary artery. Arteriography was performed. There is large volume nearly occlusive thrombus in the left main pulmonary artery extending into the left lower pulmonary artery. The angled catheter was successfully advanced into a peripheral branch of the left lower lobe pulmonary artery. Arteriography was performed again confirming catheter location and terminal pulmonary arterial size. The Amplatz wire was advanced into the artery. The catheter was removed. The 24 French aspiration catheter was then advanced over the wire. Aspiration was performed without successful thrombus retrieval. Therefore, the 16 French aspiration catheter was advanced coaxially through the 24 French catheter and used to engage the thrombus. Aspiration was performed with successful extirpation of acute and chronic thrombus material. Follow-up arteriography demonstrates improved flow in the  lower lobe pulmonary artery with increased parenchymal blush. There is some residual chronic thrombus remaining. However, at this point in the case, blood loss was approximately 500 mL. Repeat pulmonary arterial pressures were obtained. The mean main pulmonary arterial pressure has decreased 24 mm Hg. Patient is doing well and hemodynamically stable. The decision was made to pursue no further attempts at extirpation of the residual chronic thrombus. FINDINGS: Pre thrombectomy main mean pulmonary arterial pressure 28 mm Hg. Post extirpation mean pulmonary arterial pressure 24 mm Hg. IMPRESSION: 1. Large volume bilateral pulmonary emboli with resultant pulmonary arterial hypertension. 2. Successful extirpation of acute and chronic thrombus material from the bilateral main and lower lobe pulmonary arteries. 3. Improved pulmonary arterial hypertension. Signed, Criselda Peaches, MD, [place] [facility] Radiology Electronically Signed   By: Jacqulynn Cadet M.D.   On: 12/30/2019 15:41   IR US Guide Vasc Access Right  Result Date: 12/30/2019 EXAM: ADDITIONAL ARTERIOGRAPHY; IR ULTRASOUND GUIDANCE VASC ACCESS RIGHT; THROMBECTOMY MECHANICAL; BILATERAL PULMONARY ARTERIOGRAPHY COMPARISON:  CT a chest 12/29/2019 MEDICATIONS: 10000 units heparin ANESTHESIA/SEDATION: Versed 2.5 mg IV; Fentanyl 125 mcg IV Moderate Sedation Time:  107 minutes The patient was continuously monitored during the procedure by the interventional radiology nurse under my direct supervision. FLUOROSCOPY TIME:  Fluoroscopy Time: 28 minutes 54 seconds (344 mGy). COMPLICATIONS: None immediate. TECHNIQUE: Informed written consent was obtained from the patient after a thorough discussion of the procedural risks, benefits and alternatives. All questions were addressed. Maximal Sterile Barrier Technique was utilized including caps, mask, sterile gowns, sterile gloves, sterile drape, hand hygiene and skin  antiseptic. A timeout was performed prior to the initiation of the procedure. The right common femoral vein was interrogated with ultrasound and found to be widely patent. An image was obtained and stored for the medical record. Local anesthesia was attained by infiltration with 1% lidocaine. A small dermatotomy was made. Under real-time sonographic guidance, the vessel was punctured with a 21 gauge micropuncture needle. Using standard technique, the initial micro needle was exchanged over a 0.018 micro wire for a transitional 4 [place] micro sheath. The micro sheath was then exchanged over a 0.035 wire for a 6 French 11 cm vascular sheath. A right iliac venogram was performed through the sheath. No evidence of iliac or caval DVT. An  angled pigtail catheter was advanced over a Bentson wire and navigated through the heart and into the main pulmonary artery. The main pulmonary arterial pressure was measured at 42/28 (mean 28) mm Hg. The catheter was navigated into the right main pulmonary artery. Pulmonary arteriography was performed. There is large volume thrombus within the right main pulmonary artery extending into the right lower lobe and segmental pulmonary arteries. Very poor overall perfusion of the right middle and lower lobes. There is adequate perfusion of the right upper lobe. The pigtail catheter was exchanged over a rose in wire for an angled catheter. The angled catheter was successfully navigated into a right lower lobe peripheral branch. Pulmonary arteriography was performed confirming catheter location and that the vessel was of adequate size. An Amplatz wire was advanced. The catheter was removed. The 24 [place] Inari aspiration catheter was then successfully advanced over the wire and positioned in the right main pulmonary artery. Suction aspiration was performed. There was extirpation of thrombus material from the right lower lobe pulmonary artery. Follow-up pulmonary arteriography demonstrates  persistent and likely chronic thrombus adherent to the inferior wall of the right main pulmonary artery. The Nitinol discs were advanced through the catheter and used to agitated the residual chronic thrombus. This was performed twice. Additional aspiration was performed with extirpation of additional thrombus. Follow-up venography demonstrates improved flow into the right lower lobe pulmonary artery with improved parenchymal blush, particularly in the right lower lobe. There is still persistent residual chronic thrombus along the inferior wall of the right lower lobe pulmonary artery. The aspiration catheter was brought back into the main pulmonary artery. The angled catheter was introduced coaxially in used to select the left main pulmonary artery. Arteriography was performed. There is large volume nearly occlusive thrombus in the left main pulmonary artery extending into the left lower pulmonary artery. The angled catheter was successfully advanced into a peripheral branch of the left lower lobe pulmonary artery. Arteriography was performed again confirming catheter location and terminal pulmonary arterial size. The Amplatz wire was advanced into the artery. The catheter was removed. The 16 French aspiration catheter was then advanced over the wire. Aspiration was performed without successful thrombus retrieval. Therefore, the 16 French aspiration catheter was advanced coaxially through the 24 French catheter and used to engage the thrombus. Aspiration was performed with successful extirpation of acute and chronic thrombus material. Follow-up arteriography demonstrates improved flow in the lower lobe pulmonary artery with increased parenchymal blush. There is some residual chronic thrombus remaining. However, at this point in the case, blood loss was approximately 500 mL. Repeat pulmonary arterial pressures were obtained. The mean main pulmonary arterial pressure has decreased 24 mm Hg. Patient is doing well and  hemodynamically stable. The decision was made to pursue no further attempts at extirpation of the residual chronic thrombus. FINDINGS: Pre thrombectomy main mean pulmonary arterial pressure 28 mm Hg. Post extirpation mean pulmonary arterial pressure 24 mm Hg. IMPRESSION: 1. Large volume bilateral pulmonary emboli with resultant pulmonary arterial hypertension. 2. Successful extirpation of acute and chronic thrombus material from the bilateral main and lower lobe pulmonary arteries. 3. Improved pulmonary arterial hypertension. Signed, Criselda Peaches, MD, [facility] Radiology Electronically Signed   By: Jacqulynn Cadet M.D.   On: 12/30/2019 15:41   DG Chest Port 1 View  Result Date: 12/29/2019 CLINICAL DATA:  Hypoxia shortness of breath. EXAM: PORTABLE CHEST 1 VIEW COMPARISON:  07/25/2018 FINDINGS: Globular appearance of cardiac silhouette with cardiac enlargement  that is likely also accentuated by portable technique. Hilar structures are unremarkable. Patchy opacities in the RIGHT and LEFT mid chest. No dense consolidation. No sign of pleural effusion. Visualized skeletal structures are unremarkable. IMPRESSION: Patchy opacities in the RIGHT and LEFT mid chest, potentially related to developing infection, viral or atypical process. Given history of trauma on the side of trauma small area of contusion is also considered. Globular cardiac silhouette.  Pericardial effusion not excluded. Electronically Signed   By: Zetta Bills M.D.   On: 12/29/2019 17:32   ECHOCARDIOGRAM COMPLETE  Result Date: 12/30/2019    ECHOCARDIOGRAM LIMITED REPORT   Patient Name:   Cody Joyce Date of Exam: 12/30/2019 Medical Rec #:  [id number]       Height:       73.0 in Accession #:    [id number]      Weight:       185.9 lb Date of Birth:  [birth year]        BSA:          2.086 m Patient Age:    65 years        BP:           142/106 mmHg Patient Gender: M               HR:            90 bpm. Exam Location:  Inpatient Procedure: 2D Echo, Cardiac Doppler and Color Doppler Indications:    I26.02 Pulmonary embolus  History:        Patient has no prior history of Echocardiogram examinations.  Sonographer:    Merrie Roof RDCS Referring Phys: [id number] [place]  1. Left ventricular ejection fraction, by estimation, is 35 to 40%. The left ventricle has moderately decreased function. The left ventricle demonstrates global hypokinesis. Left ventricular diastolic parameters are consistent with Grade I diastolic dysfunction (impaired relaxation). There is the interventricular septum is flattened in systole and diastole, consistent with right ventricular pressure and volume overload.  2. Right ventricular systolic function is moderately reduced.  3. Right atrial size was moderately dilated.  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 Trivial mitral valve regurgitation.  5. Unable to obtain adequate Doppler signal to estimate pulmonary pressures. Tricuspid valve regurgitation is trivial. FINDINGS  Left Ventricle: Left ventricular ejection fraction, by estimation, is 35 to 40%. The left ventricle has moderately decreased function. The left ventricle demonstrates global hypokinesis. The interventricular septum is flattened in systole and diastole, consistent with right ventricular pressure and volume overload. Right Ventricle: Right ventricular systolic function is moderately reduced. Right Atrium: Right atrial size was moderately dilated. Mitral Valve: The mitral valve is myxomatous. There is moderate thickening of the mitral valve leaflet(s). There is moderate calcification of the mitral valve leaflet(s). Trivial mitral valve regurgitation. Tricuspid Valve: Unable to obtain adequate Doppler signal to estimate pulmonary pressures. The tricuspid valve is normal in structure. Tricuspid valve regurgitation is trivial. Pulmonic Valve: Pulmonic valve regurgitation is mild.  LEFT VENTRICLE PLAX 2D  LVIDd:         4.40 cm     Diastology LVIDs:         4.50 cm     LV e' lateral:   6.09 cm/s LV PW:         0.90 cm     LV E/e' lateral: 5.6 LV IVS:        1.00 cm     LV e' medial:    4.68  cm/s LVOT diam:     2.00 cm     LV E/e' medial:  7.2 LV SV:         30 LV SV Index:   14 LVOT Area:     3.14 cm  LV Volumes (MOD) LV vol d, MOD A2C: 97.5 ml LV vol d, MOD A4C: 92.5 ml LV vol s, MOD A2C: 71.5 ml LV vol s, MOD A4C: 47.8 ml LV SV MOD A2C:     26.0 ml LV SV MOD A4C:     92.5 ml LV SV MOD BP:      37.6 ml RIGHT VENTRICLE            IVC RV Basal diam:  5.20 cm    IVC diam: 2.00 cm RV Mid diam:    4.90 cm RV S prime:     9.68 cm/s TAPSE (M-mode): 1.4 cm LEFT ATRIUM             Index       RIGHT ATRIUM           Index LA diam:        3.60 cm 1.73 cm/m  RA Area:     24.50 cm LA Vol (A2C):   71.4 ml 34.23 ml/m RA Volume:   89.10 ml  42.72 ml/m LA Vol (A4C):   32.8 ml 15.73 ml/m LA Biplane Vol: 49.0 ml 23.49 ml/m  AORTIC VALVE LVOT Vmax:   66.60 cm/s LVOT Vmean:  36.200 cm/s LVOT VTI:    0.096 m  AORTA Ao Root diam: 3.20 cm Ao Asc diam:  3.00 cm MITRAL VALVE MV Area (PHT): 5.23 cm    SHUNTS MV Decel Time: 145 msec    Systemic VTI:  0.10 m MV E velocity: 33.80 cm/s  Systemic Diam: 2.00 cm MV A velocity: 55.70 cm/s MV E/A ratio:  0.61 Candee Furbish MD Electronically signed by Candee Furbish MD Signature Date/Time: 12/30/2019/2:26:36 PM    Final    VAS [place] LOWER EXTREMITY VENOUS (DVT)  Result Date: 12/31/2019  Lower Venous DVTStudy Indications: Pulmonary embolism, and recent left hamtring injury.  Comparison Study: No prior study on file Performing Technologist: Sharion Dove RVS  Examination Guidelines: A complete evaluation includes B-mode imaging, spectral Doppler, color Doppler, and power Doppler as needed of all accessible portions of each vessel. Bilateral testing is considered an integral part of a complete examination. Limited examinations for reoccurring indications may be performed as noted. The reflux portion  of the exam is performed with the patient in reverse Trendelenburg.  +---------+---------------+---------+-----------+----------+--------------+ RIGHT    CompressibilityPhasicitySpontaneityPropertiesThrombus Aging +---------+---------------+---------+-----------+----------+--------------+ CFV      Full           No       No                                  +---------+---------------+---------+-----------+----------+--------------+ SFJ      Full                                                        +---------+---------------+---------+-----------+----------+--------------+ FV Prox  Full                                                        +---------+---------------+---------+-----------+----------+--------------+  FV Mid   Full                                                        +---------+---------------+---------+-----------+----------+--------------+ FV DistalFull                                                        +---------+---------------+---------+-----------+----------+--------------+ PFV      Full                                                        +---------+---------------+---------+-----------+----------+--------------+ POP      Full           No       No                   sluggish flow  +---------+---------------+---------+-----------+----------+--------------+ PTV      Full                                                        +---------+---------------+---------+-----------+----------+--------------+ PERO     Full                                                        +---------+---------------+---------+-----------+----------+--------------+   +---------+---------------+---------+-----------+----------+-----------------+ LEFT     CompressibilityPhasicitySpontaneityPropertiesThrombus Aging    +---------+---------------+---------+-----------+----------+-----------------+ CFV      Partial        Yes      Yes                   Age Indeterminate +---------+---------------+---------+-----------+----------+-----------------+ SFJ      Full                                         Age Indeterminate +---------+---------------+---------+-----------+----------+-----------------+ FV Prox  Partial                                      Age Indeterminate +---------+---------------+---------+-----------+----------+-----------------+ FV Mid   Partial                                      Age Indeterminate +---------+---------------+---------+-----------+----------+-----------------+ FV DistalPartial                                      Age Indeterminate +---------+---------------+---------+-----------+----------+-----------------+ PFV      Full                                                           +---------+---------------+---------+-----------+----------+-----------------+  POP      Partial        Yes      No                   Age Indeterminate +---------+---------------+---------+-----------+----------+-----------------+ PTV      Full                                                           +---------+---------------+---------+-----------+----------+-----------------+ PERO     Full                                                           +---------+---------------+---------+-----------+----------+-----------------+     Summary: RIGHT: - There is no evidence of deep vein thrombosis in the lower extremity.  sluggish flow noted in popliteal vein  LEFT: - Findings consistent with age indeterminate deep vein thrombosis involving the left common femoral vein, SF junction, left femoral vein, and left popliteal vein.  *See table(s) above for measurements and observations.    Preliminary      Assessment and Plan:   1. ASD: The findings on his echocardiogram are strongly suggestive of atrial septal defect, although on transthoracic echo the exact location of the defect cannot be  ascertained.  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  Before any contrast otherwise arrives to the left side of the heart, there is contrast visible in the right upper pulmonary vein that is probably shunting from the junction of the atrial septum with the superior vena cava.  Recommend transesophageal echocardiography to clarify this diagnosis.  It is possible that this has been a relatively small shunt over the years, which would explain his lack of symptoms.  Right to left shunting due to sudden increase in right ventricular afterload with pulmonary embolism can explain paradoxical embolism with renal infarction, as well as his remarkably severe hypoxemia on presentation..  If he does have a sinus stenosis ASD this is not amenable to device closure typically.  If deemed necessary, he would likely require a surgical ASD repair.  2. LV systolic dysfunction: This is more challenging to explain.  On my review LVEF is even lower than reported, around 30%. He has global LV hypokinesis.  He does not have a history of angina pectoris and does not have visible coronary atherosclerosis/calcification on chest CT.  He does have some risk factors for CAD including smoking, prediabetes and a family history (albeit not at an early age).  We could assess this further with CT angiography versus invasive left heart catheterization, but I prefer the latter coupled with a right heart catheterization to better characterize the shunt through the ASD.  If he does not have CAD, differential diagnosis includes transient coronary embolism with myocardial stunning, alcoholic cardiomyopathy or even Takotsubo syndrome, even though the clinical tableau is certainly not typical for any of these. 3. Pulmonary embolism/acute cor pulmonale/RV systolic failure: Confirmed DVT of the left lower extremity.  I suspect that the chest trauma from the tree limbs has nothing to do with the pain  in his chest  and that this was always due to acute pulmonary embolism.  On intravenous heparin.  Please defer initiation of oral anticoagulants until after cardiac catheterization.  Elevated transaminases and troponin could be explained by RV strain and his RV still appears severely depressed on today's echo..  Discussed TEE as well as R and L heart catheterization in detail Tentatively scheduled for TEE tomorrow at 1400h, but may not be able to schedule R/L heart cath until Wednesday.    This procedure has been fully reviewed with the patient and written informed consent has been obtained.   For questions or updates, please contact [place] Please consult [email or web address] for contact info under     Signed, Sanda Klein, MD  12/31/2019 12:46 PM

## 2019-12-31 NOTE — Progress Notes (Signed)
CRITICAL VALUE ALERT  Critical Value:  Troponin 3000  Date & Time Notied:  12/31/19  Provider Notified: Dr. Sharlet Salina   Orders Received/Actions taken: awaiting response. Will continue to monitor

## 2019-12-31 NOTE — Progress Notes (Addendum)
ANTICOAGULATION CONSULT NOTE  Pharmacy Consult for Heparin Indication: pulmonary embolus  No Known Allergies  Patient Measurements: Height: 6\' 1"  (185.4 cm) Weight: 79.3 kg (174 lb 13.2 oz) IBW/kg (Calculated) : 79.9 Heparin Dosing Weight: 79.3 kg  Vital Signs: Temp: 98.2 F (36.8 C) (04/25 2026) Temp Source: Axillary (04/25 2026) BP: 124/85 (04/25 2026) Pulse Rate: 61 (04/25 2026)  Labs: Recent Labs    12/29/19 1633 12/29/19 1633 12/29/19 2221 12/29/19 2333 12/29/19 2333 12/30/19 0620 12/30/19 1902 12/31/19 0537  HGB 14.6   < >  --  15.0   < > 14.1  --  12.9*  HCT 43.3   < >  --  44.0  --  41.2  --  38.1*  PLT 132*  --   --   --   --  120*  --  147*  LABPROT  --   --   --   --   --   --  18.8*  --   INR  --   --   --   --   --   --  1.6*  --   HEPARINUNFRC  --   --  0.55  --   --  0.31  --  0.20*  CREATININE 1.74*  --   --   --   --  1.96*  --  1.82*  TROPONINIHS 4,863*  --  6,642*  --   --   --   --   --    < > = values in this interval not displayed.    Estimated Creatinine Clearance: 49 mL/min (A) (by C-G formula based on SCr of 1.82 mg/dL (H)).  Assessment: 59 y.o. male with PE for heparin.   Heparin level is sub-therapeutic at 0.20 for goal on rate of 1500 units/hr.  Hgb is down some from admission but platelets have increased at 147. No bleeding has been reported. INR yesterday PM noted to be 1.6.   Goal of Therapy:  Heparin level 0.3-0.7 units/ml Monitor platelets by anticoagulation protocol: Yes   Plan:  Increase Heparin to 1750 units/hr.  Recheck Heparin level in 6 hours.  Monitor daily heparin level, CBC, and for any signs of bleeding.   Sloan Leiter, PharmD, BCPS, BCCCP Clinical Pharmacist Please refer to [facility] for [place] numbers 12/31/2019,7:12 AM

## 2019-12-31 NOTE — Progress Notes (Signed)
Supervising Physician: Sandi Mariscal  Patient Status: [facility] - In-pt  Subjective: S/p Pulmonary angiogram with bilateral pulmonary artery suction thrombectomy.  Pt resting in bed, feels markedly better. No acute distress Other new findings noted, ongoing workup  Objective: Physical Exam: BP (!) 143/99 (BP Location: Right Arm)   Pulse 91   Temp 98.1 F (36.7 C)   Resp 20   Ht 6\' 1"  (1.854 m)   Wt 79.3 kg   SpO2 (!) 89%   BMI 23.07 kg/m  A&O x 3 Heart:RRR Lungs: CTA B Ext: (R)groin access soft, NT, no hematoma   Current Facility-Administered Medications:  .  docusate sodium (COLACE) capsule 100 mg, 100 mg, Oral, BID PRN, Gleason, Otilio Carpen, PA-C .  heparin ADULT infusion 100 units/mL (25000 units/214mL sodium chloride 0.45%), 1,750 Units/hr, Intravenous, Continuous, Priscella Mann, RPH, Last Rate: 17.5 mL/hr at 12/31/19 0846, 1,750 Units/hr at 12/31/19 0846 .  ipratropium-albuterol (DUONEB) 0.5-2.5 (3) MG/3ML nebulizer solution 3 mL, 3 mL, Nebulization, Q4H PRN, Gleason, Otilio Carpen, PA-C .  pantoprazole (PROTONIX) EC tablet 40 mg, 40 mg, Oral, Daily, Eubanks, Katalina M, NP .  polyethylene glycol (MIRALAX / GLYCOLAX) packet 17 g, 17 g, Oral, Daily PRN, Gleason, Otilio Carpen, PA-C  Labs: CBC Recent Labs    12/30/19 0620 12/31/19 0537  WBC 20.8* 21.7*  HGB 14.1 12.9*  HCT 41.2 38.1*  PLT 120* 147*   BMET Recent Labs    12/30/19 0620 12/31/19 0537  NA 143 141  K 5.5* 4.7  CL 111 111  CO2 21* 20*  GLUCOSE 129* 143*  BUN 43* 49*  CREATININE 1.96* 1.82*  CALCIUM 8.5* 7.9*   LFT Recent Labs    12/30/19 0620  PROT 6.8  ALBUMIN 2.1*  AST 109*  ALT 145*  ALKPHOS 113  BILITOT 1.3*   PT/INR Recent Labs    12/30/19 1902  LABPROT 18.8*  INR 1.6*     Studies/Results: CT Angio Chest PE W and/or Wo Contrast  Result Date: 12/29/2019 CLINICAL DATA:  Short of breath, right-sided chest and rib pain, recent history of trauma EXAM: CT ANGIOGRAPHY CHEST WITH  CONTRAST TECHNIQUE: Multidetector CT imaging of the chest was performed using the standard protocol during bolus administration of intravenous contrast. Multiplanar CT image reconstructions and MIPs were obtained to evaluate the vascular anatomy. CONTRAST:  140mL OMNIPAQUE IOHEXOL 350 MG/ML SOLN COMPARISON:  None. FINDINGS: Cardiovascular: This is a technically adequate evaluation of the pulmonary vasculature. There are large bilateral pulmonary emboli filling the main pulmonary arteries. There is straightening of the interventricular septum and mild dilation of the right ventricle, with RV/LV ratio measuring 1.3. Along the nondependent surface of the right ventral, actually better visualized on the corresponding abdominal CT, there is likely a here mural thrombus. Echocardiography may be useful for further evaluation. No pericardial effusion. The thoracic aorta is normal in caliber without aneurysm or dissection. Mediastinum/Nodes: No enlarged mediastinal, hilar, or axillary lymph nodes. Thyroid gland, trachea, and esophagus demonstrate no significant findings. Lungs/Pleura: There is background emphysema. Bilateral areas of airspace disease are seen within the dependent upper lobes and superior segment of the bilateral lower lobes. Ground-glass opacities are seen within the dependent lower lobes. No effusion or pneumothorax. Central airways are patent. Upper Abdomen: No acute abnormality. Musculoskeletal: No acute or destructive bony lesions. Reconstructed images demonstrate no additional findings. Review of the MIP images confirms the above findings. IMPRESSION: 1. Large bilateral pulmonary emboli with CT evidence of right heart strain (RV/LV Ratio =  1.3) consistent with at least submassive (intermediate risk) PE. The presence of right heart strain has been associated with an increased risk of morbidity and mortality. 2. Likely adherent mural thrombus along the anterior wall the right ventricle, better visualized  on the corresponding abdominal CT. 3. Bilateral areas of consolidation and ground-glass airspace disease, superimposed upon background emphysema. Findings favor pulmonary edema with developing pulmonary infarctions. 4.  Emphysema (ICD10-J43.9). These results were called by telephone at the time of interpretation on 12/29/2019 at 7:07 pm to provider Dr. Melina Copa, who verbally acknowledged these results. Electronically Signed   By: Randa Ngo M.D.   On: 12/29/2019 19:08   CT ABDOMEN PELVIS W CONTRAST  Result Date: 12/29/2019 CLINICAL DATA:  History of abdominal trauma, shortness of breath, right upper quadrant pain EXAM: CT ABDOMEN AND PELVIS WITH CONTRAST TECHNIQUE: Multidetector CT imaging of the abdomen and pelvis was performed using the standard protocol following bolus administration of intravenous contrast. CONTRAST:  135mL OMNIPAQUE IOHEXOL 350 MG/ML SOLN COMPARISON:  None. FINDINGS: Lower chest: Large bilateral pulmonary emboli are seen. Diffuse ground-glass opacities throughout the lung bases. Please refer to corresponding CT chest report for important discussion of these findings. Hepatobiliary: No hepatic injury or perihepatic hematoma. Gallbladder is unremarkable Pancreas: Unremarkable. No pancreatic ductal dilatation or surrounding inflammatory changes. Spleen: No splenic injury or perisplenic hematoma. Adrenals/Urinary Tract: There are multiple wedge-shaped hypodensities within the left kidney consistent with renal infarcts. There is no surrounding fluid or perinephric fat stranding to suggest posttraumatic etiology. The right kidney enhances normally. The adrenals are unremarkable. Bladder is grossly normal. Stomach/Bowel: No bowel obstruction or ileus. Normal appendix right lower quadrant. No bowel wall thickening or inflammatory change. Vascular/Lymphatic: Minimal atherosclerosis of the abdominal aorta. The visualized vascular structures opacify normally. No pathologic adenopathy. Reproductive:  Prostate is unremarkable. Other: No free fluid or free gas. No abdominal wall hernia. No evidence of soft tissue injury related to recent trauma. Musculoskeletal: No acute or destructive bony lesions. Reconstructed images demonstrate no additional findings. IMPRESSION: 1. Multiple left renal cortical infarcts. 2. Large bilateral pulmonary emboli. Please refer to corresponding CT chest report for important discussion of these findings. 3. No evidence of intra-abdominal or intrapelvic trauma. 4. Aortic Atherosclerosis (ICD10-I70.0). These results were called by telephone at the time of interpretation on 12/29/2019 at 7:03 pm to provider Dr. Melina Copa, who verbally acknowledged these results. Electronically Signed   By: Randa Ngo M.D.   On: 12/29/2019 19:08   IR Angiogram Pulmonary Bilateral Selective  Result Date: 12/30/2019 EXAM: ADDITIONAL ARTERIOGRAPHY; IR ULTRASOUND GUIDANCE VASC ACCESS RIGHT; THROMBECTOMY MECHANICAL; BILATERAL PULMONARY ARTERIOGRAPHY COMPARISON:  CT a chest 12/29/2019 MEDICATIONS: 10000 units heparin ANESTHESIA/SEDATION: Versed 2.5 mg IV; Fentanyl 125 mcg IV Moderate Sedation Time:  107 minutes The patient was continuously monitored during the procedure by the interventional radiology nurse under my direct supervision. FLUOROSCOPY TIME:  Fluoroscopy Time: 28 minutes 54 seconds (344 mGy). COMPLICATIONS: None immediate. TECHNIQUE: Informed written consent was obtained from the patient after a thorough discussion of the procedural risks, benefits and alternatives. All questions were addressed. Maximal Sterile Barrier Technique was utilized including caps, mask, sterile gowns, sterile gloves, sterile drape, hand hygiene and skin antiseptic. A timeout was performed prior to the initiation of the procedure. The right common femoral vein was interrogated with ultrasound and found to be widely patent. An image was obtained and stored for the medical record. Local anesthesia was attained by  infiltration with 1% lidocaine. A small dermatotomy was made. Under real-time sonographic guidance,  the vessel was punctured with a 21 gauge micropuncture needle. Using standard technique, the initial micro needle was exchanged over a 0.018 micro wire for a transitional 4 [place] micro sheath. The micro sheath was then exchanged over a 0.035 wire for a 6 French 11 cm vascular sheath. A right iliac venogram was performed through the sheath. No evidence of iliac or caval DVT. An angled pigtail catheter was advanced over a Bentson wire and navigated through the heart and into the main pulmonary artery. The main pulmonary arterial pressure was measured at 42/28 (mean 28) mm Hg. The catheter was navigated into the right main pulmonary artery. Pulmonary arteriography was performed. There is large volume thrombus within the right main pulmonary artery extending into the right lower lobe and segmental pulmonary arteries. Very poor overall perfusion of the right middle and lower lobes. There is adequate perfusion of the right upper lobe. The pigtail catheter was exchanged over a rose in wire for an angled catheter. The angled catheter was successfully navigated into a right lower lobe peripheral branch. Pulmonary arteriography was performed confirming catheter location and that the vessel was of adequate size. An Amplatz wire was advanced. The catheter was removed. The 24 [place] Inari aspiration catheter was then successfully advanced over the wire and positioned in the right main pulmonary artery. Suction aspiration was performed. There was extirpation of thrombus material from the right lower lobe pulmonary artery. Follow-up pulmonary arteriography demonstrates persistent and likely chronic thrombus adherent to the inferior wall of the right main pulmonary artery. The Nitinol discs were advanced through the catheter and used to agitated the residual chronic thrombus. This was performed twice. Additional aspiration was  performed with extirpation of additional thrombus. Follow-up venography demonstrates improved flow into the right lower lobe pulmonary artery with improved parenchymal blush, particularly in the right lower lobe. There is still persistent residual chronic thrombus along the inferior wall of the right lower lobe pulmonary artery. The aspiration catheter was brought back into the main pulmonary artery. The angled catheter was introduced coaxially in used to select the left main pulmonary artery. Arteriography was performed. There is large volume nearly occlusive thrombus in the left main pulmonary artery extending into the left lower pulmonary artery. The angled catheter was successfully advanced into a peripheral branch of the left lower lobe pulmonary artery. Arteriography was performed again confirming catheter location and terminal pulmonary arterial size. The Amplatz wire was advanced into the artery. The catheter was removed. The 65 French aspiration catheter was then advanced over the wire. Aspiration was performed without successful thrombus retrieval. Therefore, the 16 French aspiration catheter was advanced coaxially through the 24 French catheter and used to engage the thrombus. Aspiration was performed with successful extirpation of acute and chronic thrombus material. Follow-up arteriography demonstrates improved flow in the lower lobe pulmonary artery with increased parenchymal blush. There is some residual chronic thrombus remaining. However, at this point in the case, blood loss was approximately 500 mL. Repeat pulmonary arterial pressures were obtained. The mean main pulmonary arterial pressure has decreased 24 mm Hg. Patient is doing well and hemodynamically stable. The decision was made to pursue no further attempts at extirpation of the residual chronic thrombus. FINDINGS: Pre thrombectomy main mean pulmonary arterial pressure 28 mm Hg. Post extirpation mean pulmonary arterial pressure 24 mm Hg.  IMPRESSION: 1. Large volume bilateral pulmonary emboli with resultant pulmonary arterial hypertension. 2. Successful extirpation of acute and chronic thrombus material from the bilateral main and lower lobe pulmonary arteries. 3.  Improved pulmonary arterial hypertension. Signed, Criselda Peaches, MD, [place] [facility] Radiology Electronically Signed   By: Jacqulynn Cadet M.D.   On: 12/30/2019 15:41   IR Angiogram Selective Each Additional Vessel  Result Date: 12/30/2019 EXAM: ADDITIONAL ARTERIOGRAPHY; IR ULTRASOUND GUIDANCE VASC ACCESS RIGHT; THROMBECTOMY MECHANICAL; BILATERAL PULMONARY ARTERIOGRAPHY COMPARISON:  CT a chest 12/29/2019 MEDICATIONS: 10000 units heparin ANESTHESIA/SEDATION: Versed 2.5 mg IV; Fentanyl 125 mcg IV Moderate Sedation Time:  107 minutes The patient was continuously monitored during the procedure by the interventional radiology nurse under my direct supervision. FLUOROSCOPY TIME:  Fluoroscopy Time: 28 minutes 54 seconds (344 mGy). COMPLICATIONS: None immediate. TECHNIQUE: Informed written consent was obtained from the patient after a thorough discussion of the procedural risks, benefits and alternatives. All questions were addressed. Maximal Sterile Barrier Technique was utilized including caps, mask, sterile gowns, sterile gloves, sterile drape, hand hygiene and skin antiseptic. A timeout was performed prior to the initiation of the procedure. The right common femoral vein was interrogated with ultrasound and found to be widely patent. An image was obtained and stored for the medical record. Local anesthesia was attained by infiltration with 1% lidocaine. A small dermatotomy was made. Under real-time sonographic guidance, the vessel was punctured with a 21 gauge micropuncture needle. Using standard technique, the initial micro needle was exchanged over a 0.018 micro wire for a transitional 4 [place] micro sheath. The micro sheath was  then exchanged over a 0.035 wire for a 6 French 11 cm vascular sheath. A right iliac venogram was performed through the sheath. No evidence of iliac or caval DVT. An angled pigtail catheter was advanced over a Bentson wire and navigated through the heart and into the main pulmonary artery. The main pulmonary arterial pressure was measured at 42/28 (mean 28) mm Hg. The catheter was navigated into the right main pulmonary artery. Pulmonary arteriography was performed. There is large volume thrombus within the right main pulmonary artery extending into the right lower lobe and segmental pulmonary arteries. Very poor overall perfusion of the right middle and lower lobes. There is adequate perfusion of the right upper lobe. The pigtail catheter was exchanged over a rose in wire for an angled catheter. The angled catheter was successfully navigated into a right lower lobe peripheral branch. Pulmonary arteriography was performed confirming catheter location and that the vessel was of adequate size. An Amplatz wire was advanced. The catheter was removed. The 24 [place] Inari aspiration catheter was then successfully advanced over the wire and positioned in the right main pulmonary artery. Suction aspiration was performed. There was extirpation of thrombus material from the right lower lobe pulmonary artery. Follow-up pulmonary arteriography demonstrates persistent and likely chronic thrombus adherent to the inferior wall of the right main pulmonary artery. The Nitinol discs were advanced through the catheter and used to agitated the residual chronic thrombus. This was performed twice. Additional aspiration was performed with extirpation of additional thrombus. Follow-up venography demonstrates improved flow into the right lower lobe pulmonary artery with improved parenchymal blush, particularly in the right lower lobe. There is still persistent residual chronic thrombus along the inferior wall of the right lower lobe  pulmonary artery. The aspiration catheter was brought back into the main pulmonary artery. The angled catheter was introduced coaxially in used to select the left main pulmonary artery. Arteriography was performed. There is large volume nearly occlusive thrombus in the left main pulmonary artery extending into the left lower pulmonary artery. The angled catheter was successfully advanced  into a peripheral branch of the left lower lobe pulmonary artery. Arteriography was performed again confirming catheter location and terminal pulmonary arterial size. The Amplatz wire was advanced into the artery. The catheter was removed. The 71 French aspiration catheter was then advanced over the wire. Aspiration was performed without successful thrombus retrieval. Therefore, the 16 French aspiration catheter was advanced coaxially through the 24 French catheter and used to engage the thrombus. Aspiration was performed with successful extirpation of acute and chronic thrombus material. Follow-up arteriography demonstrates improved flow in the lower lobe pulmonary artery with increased parenchymal blush. There is some residual chronic thrombus remaining. However, at this point in the case, blood loss was approximately 500 mL. Repeat pulmonary arterial pressures were obtained. The mean main pulmonary arterial pressure has decreased 24 mm Hg. Patient is doing well and hemodynamically stable. The decision was made to pursue no further attempts at extirpation of the residual chronic thrombus. FINDINGS: Pre thrombectomy main mean pulmonary arterial pressure 28 mm Hg. Post extirpation mean pulmonary arterial pressure 24 mm Hg. IMPRESSION: 1. Large volume bilateral pulmonary emboli with resultant pulmonary arterial hypertension. 2. Successful extirpation of acute and chronic thrombus material from the bilateral main and lower lobe pulmonary arteries. 3. Improved pulmonary arterial hypertension. Signed, Criselda Peaches, MD, [place]  [facility] Radiology Electronically Signed   By: Jacqulynn Cadet M.D.   On: 12/30/2019 15:41   IR Angiogram Selective Each Additional Vessel  Result Date: 12/30/2019 EXAM: ADDITIONAL ARTERIOGRAPHY; IR ULTRASOUND GUIDANCE VASC ACCESS RIGHT; THROMBECTOMY MECHANICAL; BILATERAL PULMONARY ARTERIOGRAPHY COMPARISON:  CT a chest 12/29/2019 MEDICATIONS: 10000 units heparin ANESTHESIA/SEDATION: Versed 2.5 mg IV; Fentanyl 125 mcg IV Moderate Sedation Time:  107 minutes The patient was continuously monitored during the procedure by the interventional radiology nurse under my direct supervision. FLUOROSCOPY TIME:  Fluoroscopy Time: 28 minutes 54 seconds (344 mGy). COMPLICATIONS: None immediate. TECHNIQUE: Informed written consent was obtained from the patient after a thorough discussion of the procedural risks, benefits and alternatives. All questions were addressed. Maximal Sterile Barrier Technique was utilized including caps, mask, sterile gowns, sterile gloves, sterile drape, hand hygiene and skin antiseptic. A timeout was performed prior to the initiation of the procedure. The right common femoral vein was interrogated with ultrasound and found to be widely patent. An image was obtained and stored for the medical record. Local anesthesia was attained by infiltration with 1% lidocaine. A small dermatotomy was made. Under real-time sonographic guidance, the vessel was punctured with a 21 gauge micropuncture needle. Using standard technique, the initial micro needle was exchanged over a 0.018 micro wire for a transitional 4 [place] micro sheath. The micro sheath was then exchanged over a 0.035 wire for a 6 French 11 cm vascular sheath. A right iliac venogram was performed through the sheath. No evidence of iliac or caval DVT. An angled pigtail catheter was advanced over a Bentson wire and navigated through the heart and into the main pulmonary artery. The main pulmonary  arterial pressure was measured at 42/28 (mean 28) mm Hg. The catheter was navigated into the right main pulmonary artery. Pulmonary arteriography was performed. There is large volume thrombus within the right main pulmonary artery extending into the right lower lobe and segmental pulmonary arteries. Very poor overall perfusion of the right middle and lower lobes. There is adequate perfusion of the right upper lobe. The pigtail catheter was exchanged over a rose in wire for an angled catheter. The angled catheter was successfully navigated into a  right lower lobe peripheral branch. Pulmonary arteriography was performed confirming catheter location and that the vessel was of adequate size. An Amplatz wire was advanced. The catheter was removed. The 24 [place] In[place] aspiration catheter was then successfully advanced over the wire and positioned in the right main pulmonary artery. Suction aspiration was performed. There was extirpation of thrombus material from the right lower lobe pulmonary artery. Follow-up pulmonary arteriography demonstrates persistent and likely chronic thrombus adherent to the inferior wall of the right main pulmonary artery. The Nitinol discs were advanced through the catheter and used to agitated the residual chronic thrombus. This was performed twice. Additional aspiration was performed with extirpation of additional thrombus. Follow-up venography demonstrates improved flow into the right lower lobe pulmonary artery with improved parenchymal blush, particularly in the right lower lobe. There is still persistent residual chronic thrombus along the inferior wall of the right lower lobe pulmonary artery. The aspiration catheter was brought back into the main pulmonary artery. The angled catheter was introduced coaxially in used to select the left main pulmonary artery. Arteriography was performed. There is large volume nearly occlusive thrombus in the left main pulmonary artery extending into the  left lower pulmonary artery. The angled catheter was successfully advanced into a peripheral branch of the left lower lobe pulmonary artery. Arteriography was performed again confirming catheter location and terminal pulmonary arterial size. The Amplatz wire was advanced into the artery. The catheter was removed. The 30 French aspiration catheter was then advanced over the wire. Aspiration was performed without successful thrombus retrieval. Therefore, the 16 French aspiration catheter was advanced coaxially through the 24 French catheter and used to engage the thrombus. Aspiration was performed with successful extirpation of acute and chronic thrombus material. Follow-up arteriography demonstrates improved flow in the lower lobe pulmonary artery with increased parenchymal blush. There is some residual chronic thrombus remaining. However, at this point in the case, blood loss was approximately 500 mL. Repeat pulmonary arterial pressures were obtained. The mean main pulmonary arterial pressure has decreased 24 mm Hg. Patient is doing well and hemodynamically stable. The decision was made to pursue no further attempts at extirpation of the residual chronic thrombus. FINDINGS: Pre thrombectomy main mean pulmonary arterial pressure 28 mm Hg. Post extirpation mean pulmonary arterial pressure 24 mm Hg. IMPRESSION: 1. Large volume bilateral pulmonary emboli with resultant pulmonary arterial hypertension. 2. Successful extirpation of acute and chronic thrombus material from the bilateral main and lower lobe pulmonary arteries. 3. Improved pulmonary arterial hypertension. Signed, Criselda Peaches, MD, [place] [facility] Radiology Electronically Signed   By: Jacqulynn Cadet M.D.   On: 12/30/2019 15:41   IR THROMBECT PRIM MECH INIT (INCLU) MOD SED  Result Date: 12/30/2019 EXAM: ADDITIONAL ARTERIOGRAPHY; IR ULTRASOUND GUIDANCE VASC ACCESS RIGHT; THROMBECTOMY MECHANICAL;  BILATERAL PULMONARY ARTERIOGRAPHY COMPARISON:  CT a chest 12/29/2019 MEDICATIONS: 10000 units heparin ANESTHESIA/SEDATION: Versed 2.5 mg IV; Fentanyl 125 mcg IV Moderate Sedation Time:  107 minutes The patient was continuously monitored during the procedure by the interventional radiology nurse under my direct supervision. FLUOROSCOPY TIME:  Fluoroscopy Time: 28 minutes 54 seconds (344 mGy). COMPLICATIONS: None immediate. TECHNIQUE: Informed written consent was obtained from the patient after a thorough discussion of the procedural risks, benefits and alternatives. All questions were addressed. Maximal Sterile Barrier Technique was utilized including caps, mask, sterile gowns, sterile gloves, sterile drape, hand hygiene and skin antiseptic. A timeout was performed prior to the initiation of the procedure. The right common femoral vein was  interrogated with ultrasound and found to be widely patent. An image was obtained and stored for the medical record. Local anesthesia was attained by infiltration with 1% lidocaine. A small dermatotomy was made. Under real-time sonographic guidance, the vessel was punctured with a 21 gauge micropuncture needle. Using standard technique, the initial micro needle was exchanged over a 0.018 micro wire for a transitional 4 [place] micro sheath. The micro sheath was then exchanged over a 0.035 wire for a 6 French 11 cm vascular sheath. A right iliac venogram was performed through the sheath. No evidence of iliac or caval DVT. An angled pigtail catheter was advanced over a Bentson wire and navigated through the heart and into the main pulmonary artery. The main pulmonary arterial pressure was measured at 42/28 (mean 28) mm Hg. The catheter was navigated into the right main pulmonary artery. Pulmonary arteriography was performed. There is large volume thrombus within the right main pulmonary artery extending into the right lower lobe and segmental pulmonary arteries. Very poor overall  perfusion of the right middle and lower lobes. There is adequate perfusion of the right upper lobe. The pigtail catheter was exchanged over a rose in wire for an angled catheter. The angled catheter was successfully navigated into a right lower lobe peripheral branch. Pulmonary arteriography was performed confirming catheter location and that the vessel was of adequate size. An Amplatz wire was advanced. The catheter was removed. The 24 [place] Inari aspiration catheter was then successfully advanced over the wire and positioned in the right main pulmonary artery. Suction aspiration was performed. There was extirpation of thrombus material from the right lower lobe pulmonary artery. Follow-up pulmonary arteriography demonstrates persistent and likely chronic thrombus adherent to the inferior wall of the right main pulmonary artery. The Nitinol discs were advanced through the catheter and used to agitated the residual chronic thrombus. This was performed twice. Additional aspiration was performed with extirpation of additional thrombus. Follow-up venography demonstrates improved flow into the right lower lobe pulmonary artery with improved parenchymal blush, particularly in the right lower lobe. There is still persistent residual chronic thrombus along the inferior wall of the right lower lobe pulmonary artery. The aspiration catheter was brought back into the main pulmonary artery. The angled catheter was introduced coaxially in used to select the left main pulmonary artery. Arteriography was performed. There is large volume nearly occlusive thrombus in the left main pulmonary artery extending into the left lower pulmonary artery. The angled catheter was successfully advanced into a peripheral branch of the left lower lobe pulmonary artery. Arteriography was performed again confirming catheter location and terminal pulmonary arterial size. The Amplatz wire was advanced into the artery. The catheter was removed. The  13 French aspiration catheter was then advanced over the wire. Aspiration was performed without successful thrombus retrieval. Therefore, the 16 French aspiration catheter was advanced coaxially through the 24 French catheter and used to engage the thrombus. Aspiration was performed with successful extirpation of acute and chronic thrombus material. Follow-up arteriography demonstrates improved flow in the lower lobe pulmonary artery with increased parenchymal blush. There is some residual chronic thrombus remaining. However, at this point in the case, blood loss was approximately 500 mL. Repeat pulmonary arterial pressures were obtained. The mean main pulmonary arterial pressure has decreased 24 mm Hg. Patient is doing well and hemodynamically stable. The decision was made to pursue no further attempts at extirpation of the residual chronic thrombus. FINDINGS: Pre thrombectomy main mean pulmonary arterial pressure 28 mm Hg. Post extirpation mean  pulmonary arterial pressure 24 mm Hg. IMPRESSION: 1. Large volume bilateral pulmonary emboli with resultant pulmonary arterial hypertension. 2. Successful extirpation of acute and chronic thrombus material from the bilateral main and lower lobe pulmonary arteries. 3. Improved pulmonary arterial hypertension. Signed, Criselda Peaches, MD, [place] [facility] Electronically Signed   By: Jacqulynn Cadet M.D.   On: 12/30/2019 15:41   IR THROMBECT PRIM MECH ADD (INCLU) MOD SED  Result Date: 12/30/2019 EXAM: ADDITIONAL ARTERIOGRAPHY; IR ULTRASOUND GUIDANCE VASC ACCESS RIGHT; THROMBECTOMY MECHANICAL; BILATERAL PULMONARY ARTERIOGRAPHY COMPARISON:  CT a chest 12/29/2019 MEDICATIONS: 10000 units heparin ANESTHESIA/SEDATION: Versed 2.5 mg IV; Fentanyl 125 mcg IV Moderate Sedation Time:  107 minutes The patient was continuously monitored during the procedure by the interventional radiology nurse under my direct  supervision. FLUOROSCOPY TIME:  Fluoroscopy Time: 28 minutes 54 seconds (344 mGy). COMPLICATIONS: None immediate. TECHNIQUE: Informed written consent was obtained from the patient after a thorough discussion of the procedural risks, benefits and alternatives. All questions were addressed. Maximal Sterile Barrier Technique was utilized including caps, mask, sterile gowns, sterile gloves, sterile drape, hand hygiene and skin antiseptic. A timeout was performed prior to the initiation of the procedure. The right common femoral vein was interrogated with ultrasound and found to be widely patent. An image was obtained and stored for the medical record. Local anesthesia was attained by infiltration with 1% lidocaine. A small dermatotomy was made. Under real-time sonographic guidance, the vessel was punctured with a 21 gauge micropuncture needle. Using standard technique, the initial micro needle was exchanged over a 0.018 micro wire for a transitional 4 [place] micro sheath. The micro sheath was then exchanged over a 0.035 wire for a 6 French 11 cm vascular sheath. A right iliac venogram was performed through the sheath. No evidence of iliac or caval DVT. An angled pigtail catheter was advanced over a Bentson wire and navigated through the heart and into the main pulmonary artery. The main pulmonary arterial pressure was measured at 42/28 (mean 28) mm Hg. The catheter was navigated into the right main pulmonary artery. Pulmonary arteriography was performed. There is large volume thrombus within the right main pulmonary artery extending into the right lower lobe and segmental pulmonary arteries. Very poor overall perfusion of the right middle and lower lobes. There is adequate perfusion of the right upper lobe. The pigtail catheter was exchanged over a rose in wire for an angled catheter. The angled catheter was successfully navigated into a right lower lobe peripheral branch. Pulmonary arteriography was performed  confirming catheter location and that the vessel was of adequate size. An Amplatz wire was advanced. The catheter was removed. The 24 [place] Inari aspiration catheter was then successfully advanced over the wire and positioned in the right main pulmonary artery. Suction aspiration was performed. There was extirpation of thrombus material from the right lower lobe pulmonary artery. Follow-up pulmonary arteriography demonstrates persistent and likely chronic thrombus adherent to the inferior wall of the right main pulmonary artery. The Nitinol discs were advanced through the catheter and used to agitated the residual chronic thrombus. This was performed twice. Additional aspiration was performed with extirpation of additional thrombus. Follow-up venography demonstrates improved flow into the right lower lobe pulmonary artery with improved parenchymal blush, particularly in the right lower lobe. There is still persistent residual chronic thrombus along the inferior wall of the right lower lobe pulmonary artery. The aspiration catheter was brought back into the main pulmonary artery. The angled catheter was introduced  coaxially in used to select the left main pulmonary artery. Arteriography was performed. There is large volume nearly occlusive thrombus in the left main pulmonary artery extending into the left lower pulmonary artery. The angled catheter was successfully advanced into a peripheral branch of the left lower lobe pulmonary artery. Arteriography was performed again confirming catheter location and terminal pulmonary arterial size. The Amplatz wire was advanced into the artery. The catheter was removed. The 63 French aspiration catheter was then advanced over the wire. Aspiration was performed without successful thrombus retrieval. Therefore, the 16 French aspiration catheter was advanced coaxially through the 24 French catheter and used to engage the thrombus. Aspiration was performed with successful  extirpation of acute and chronic thrombus material. Follow-up arteriography demonstrates improved flow in the lower lobe pulmonary artery with increased parenchymal blush. There is some residual chronic thrombus remaining. However, at this point in the case, blood loss was approximately 500 mL. Repeat pulmonary arterial pressures were obtained. The mean main pulmonary arterial pressure has decreased 24 mm Hg. Patient is doing well and hemodynamically stable. The decision was made to pursue no further attempts at extirpation of the residual chronic thrombus. FINDINGS: Pre thrombectomy main mean pulmonary arterial pressure 28 mm Hg. Post extirpation mean pulmonary arterial pressure 24 mm Hg. IMPRESSION: 1. Large volume bilateral pulmonary emboli with resultant pulmonary arterial hypertension. 2. Successful extirpation of acute and chronic thrombus material from the bilateral main and lower lobe pulmonary arteries. 3. Improved pulmonary arterial hypertension. Signed, Criselda Peaches, MD, [place] [facility] Radiology Electronically Signed   By: Jacqulynn Cadet M.D.   On: 12/30/2019 15:41   IR US Guide Vasc Access Right  Result Date: 12/30/2019 EXAM: ADDITIONAL ARTERIOGRAPHY; IR ULTRASOUND GUIDANCE VASC ACCESS RIGHT; THROMBECTOMY MECHANICAL; BILATERAL PULMONARY ARTERIOGRAPHY COMPARISON:  CT a chest 12/29/2019 MEDICATIONS: 10000 units heparin ANESTHESIA/SEDATION: Versed 2.5 mg IV; Fentanyl 125 mcg IV Moderate Sedation Time:  107 minutes The patient was continuously monitored during the procedure by the interventional radiology nurse under my direct supervision. FLUOROSCOPY TIME:  Fluoroscopy Time: 28 minutes 54 seconds (344 mGy). COMPLICATIONS: None immediate. TECHNIQUE: Informed written consent was obtained from the patient after a thorough discussion of the procedural risks, benefits and alternatives. All questions were addressed. Maximal Sterile Barrier Technique  was utilized including caps, mask, sterile gowns, sterile gloves, sterile drape, hand hygiene and skin antiseptic. A timeout was performed prior to the initiation of the procedure. The right common femoral vein was interrogated with ultrasound and found to be widely patent. An image was obtained and stored for the medical record. Local anesthesia was attained by infiltration with 1% lidocaine. A small dermatotomy was made. Under real-time sonographic guidance, the vessel was punctured with a 21 gauge micropuncture needle. Using standard technique, the initial micro needle was exchanged over a 0.018 micro wire for a transitional 4 [place] micro sheath. The micro sheath was then exchanged over a 0.035 wire for a 6 French 11 cm vascular sheath. A right iliac venogram was performed through the sheath. No evidence of iliac or caval DVT. An angled pigtail catheter was advanced over a Bentson wire and navigated through the heart and into the main pulmonary artery. The main pulmonary arterial pressure was measured at 42/28 (mean 28) mm Hg. The catheter was navigated into the right main pulmonary artery. Pulmonary arteriography was performed. There is large volume thrombus within the right main pulmonary artery extending into the right lower lobe and segmental pulmonary arteries. Very poor overall  perfusion of the right middle and lower lobes. There is adequate perfusion of the right upper lobe. The pigtail catheter was exchanged over a rose in wire for an angled catheter. The angled catheter was successfully navigated into a right lower lobe peripheral branch. Pulmonary arteriography was performed confirming catheter location and that the vessel was of adequate size. An Amplatz wire was advanced. The catheter was removed. The 24 [place] In[place] aspiration catheter was then successfully advanced over the wire and positioned in the right main pulmonary artery. Suction aspiration was performed. There was extirpation of thrombus  material from the right lower lobe pulmonary artery. Follow-up pulmonary arteriography demonstrates persistent and likely chronic thrombus adherent to the inferior wall of the right main pulmonary artery. The Nitinol discs were advanced through the catheter and used to agitated the residual chronic thrombus. This was performed twice. Additional aspiration was performed with extirpation of additional thrombus. Follow-up venography demonstrates improved flow into the right lower lobe pulmonary artery with improved parenchymal blush, particularly in the right lower lobe. There is still persistent residual chronic thrombus along the inferior wall of the right lower lobe pulmonary artery. The aspiration catheter was brought back into the main pulmonary artery. The angled catheter was introduced coaxially in used to select the left main pulmonary artery. Arteriography was performed. There is large volume nearly occlusive thrombus in the left main pulmonary artery extending into the left lower pulmonary artery. The angled catheter was successfully advanced into a peripheral branch of the left lower lobe pulmonary artery. Arteriography was performed again confirming catheter location and terminal pulmonary arterial size. The Amplatz wire was advanced into the artery. The catheter was removed. The 52 French aspiration catheter was then advanced over the wire. Aspiration was performed without successful thrombus retrieval. Therefore, the 16 French aspiration catheter was advanced coaxially through the 24 French catheter and used to engage the thrombus. Aspiration was performed with successful extirpation of acute and chronic thrombus material. Follow-up arteriography demonstrates improved flow in the lower lobe pulmonary artery with increased parenchymal blush. There is some residual chronic thrombus remaining. However, at this point in the case, blood loss was approximately 500 mL. Repeat pulmonary arterial pressures were  obtained. The mean main pulmonary arterial pressure has decreased 24 mm Hg. Patient is doing well and hemodynamically stable. The decision was made to pursue no further attempts at extirpation of the residual chronic thrombus. FINDINGS: Pre thrombectomy main mean pulmonary arterial pressure 28 mm Hg. Post extirpation mean pulmonary arterial pressure 24 mm Hg. IMPRESSION: 1. Large volume bilateral pulmonary emboli with resultant pulmonary arterial hypertension. 2. Successful extirpation of acute and chronic thrombus material from the bilateral main and lower lobe pulmonary arteries. 3. Improved pulmonary arterial hypertension. Signed, Criselda Peaches, MD, [place] [facility] Electronically Signed   By: Jacqulynn Cadet M.D.   On: 12/30/2019 15:41   DG Chest Port 1 View  Result Date: 12/29/2019 CLINICAL DATA:  Hypoxia shortness of breath. EXAM: PORTABLE CHEST 1 VIEW COMPARISON:  07/25/2018 FINDINGS: Globular appearance of cardiac silhouette with cardiac enlargement that is likely also accentuated by portable technique. Hilar structures are unremarkable. Patchy opacities in the RIGHT and LEFT mid chest. No dense consolidation. No sign of pleural effusion. Visualized skeletal structures are unremarkable. IMPRESSION: Patchy opacities in the RIGHT and LEFT mid chest, potentially related to developing infection, viral or atypical process. Given history of trauma on the side of trauma small area of contusion is also considered. Globular cardiac  silhouette.  Pericardial effusion not excluded. Electronically Signed   By: Zetta Bills M.D.   On: 12/29/2019 17:32   ECHOCARDIOGRAM COMPLETE  Result Date: 12/30/2019    ECHOCARDIOGRAM LIMITED REPORT   Patient Name:   Cody Joyce Date of Exam: 12/30/2019 Medical Rec #:  [id number]       Height:       73.0 in Accession #:    [id number]      Weight:       185.9 lb Date of Birth:  07/29/1961        BSA:           2.086 m Patient Age:    59 years        BP:           142/106 mmHg Patient Gender: M               HR:           90 bpm. Exam Location:  Inpatient Procedure: 2D Echo, Cardiac Doppler and Color Doppler Indications:    I26.02 Pulmonary embolus  History:        Patient has no prior history of Echocardiogram examinations.  Sonographer:    Merrie Roof RDCS Referring Phys: [id number] [place]  1. Left ventricular ejection fraction, by estimation, is 35 to 40%. The left ventricle has moderately decreased function. The left ventricle demonstrates global hypokinesis. Left ventricular diastolic parameters are consistent with Grade I diastolic dysfunction (impaired relaxation). There is the interventricular septum is flattened in systole and diastole, consistent with right ventricular pressure and volume overload.  2. Right ventricular systolic function is moderately reduced.  3. Right atrial size was moderately dilated.  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 Trivial mitral valve regurgitation.  5. Unable to obtain adequate Doppler signal to estimate pulmonary pressures. Tricuspid valve regurgitation is trivial. FINDINGS  Left Ventricle: Left ventricular ejection fraction, by estimation, is 35 to 40%. The left ventricle has moderately decreased function. The left ventricle demonstrates global hypokinesis. The interventricular septum is flattened in systole and diastole, consistent with right ventricular pressure and volume overload. Right Ventricle: Right ventricular systolic function is moderately reduced. Right Atrium: Right atrial size was moderately dilated. Mitral Valve: The mitral valve is myxomatous. There is moderate thickening of the mitral valve leaflet(s). There is moderate calcification of the mitral valve leaflet(s). Trivial mitral valve regurgitation. Tricuspid Valve: Unable to obtain adequate Doppler signal to estimate pulmonary pressures. The tricuspid valve is normal in structure. Tricuspid valve  regurgitation is trivial. Pulmonic Valve: Pulmonic valve regurgitation is mild.  LEFT VENTRICLE PLAX 2D LVIDd:         4.40 cm     Diastology LVIDs:         4.50 cm     LV e' lateral:   6.09 cm/s LV PW:         0.90 cm     LV E/e' lateral: 5.6 LV IVS:        1.00 cm     LV e' medial:    4.68 cm/s LVOT diam:     2.00 cm     LV E/e' medial:  7.2 LV SV:         30 LV SV Index:   14 LVOT Area:     3.14 cm  LV Volumes (MOD) LV vol d, MOD A2C: 97.5 ml LV vol d, MOD A4C: 92.5 ml LV vol s, MOD A2C: 71.5 ml LV vol s,  MOD A4C: 47.8 ml LV SV MOD A2C:     26.0 ml LV SV MOD A4C:     92.5 ml LV SV MOD BP:      37.6 ml RIGHT VENTRICLE            IVC RV Basal diam:  5.20 cm    IVC diam: 2.00 cm RV Mid diam:    4.90 cm RV S prime:     9.68 cm/s TAPSE (M-mode): 1.4 cm LEFT ATRIUM             Index       RIGHT ATRIUM           Index LA diam:        3.60 cm 1.73 cm/m  RA Area:     24.50 cm LA Vol (A2C):   71.4 ml 34.23 ml/m RA Volume:   89.10 ml  42.72 ml/m LA Vol (A4C):   32.8 ml 15.73 ml/m LA Biplane Vol: 49.0 ml 23.49 ml/m  AORTIC VALVE LVOT Vmax:   66.60 cm/s LVOT Vmean:  36.200 cm/s LVOT VTI:    0.096 m  AORTA Ao Root diam: 3.20 cm Ao Asc diam:  3.00 cm MITRAL VALVE MV Area (PHT): 5.23 cm    SHUNTS MV Decel Time: 145 msec    Systemic VTI:  0.10 m MV E velocity: 33.80 cm/s  Systemic Diam: 2.00 cm MV A velocity: 55.70 cm/s MV E/A ratio:  0.61 Candee Furbish MD Electronically signed by Candee Furbish MD Signature Date/Time: 12/30/2019/2:26:36 PM    Final    VAS [place] LOWER EXTREMITY VENOUS (DVT)  Result Date: 12/31/2019  Lower Venous DVTStudy Indications: Pulmonary embolism, and recent left hamtring injury.  Comparison Study: No prior study on file Performing Technologist: Sharion Dove RVS  Examination Guidelines: A complete evaluation includes B-mode imaging, spectral Doppler, color Doppler, and power Doppler as needed of all accessible portions of each vessel. Bilateral testing is considered an integral part of a complete  examination. Limited examinations for reoccurring indications may be performed as noted. The reflux portion of the exam is performed with the patient in reverse Trendelenburg.  +---------+---------------+---------+-----------+----------+--------------+ RIGHT    CompressibilityPhasicitySpontaneityPropertiesThrombus Aging +---------+---------------+---------+-----------+----------+--------------+ CFV      Full           No       No                                  +---------+---------------+---------+-----------+----------+--------------+ SFJ      Full                                                        +---------+---------------+---------+-----------+----------+--------------+ FV Prox  Full                                                        +---------+---------------+---------+-----------+----------+--------------+ FV Mid   Full                                                        +---------+---------------+---------+-----------+----------+--------------+  FV DistalFull                                                        +---------+---------------+---------+-----------+----------+--------------+ PFV      Full                                                        +---------+---------------+---------+-----------+----------+--------------+ POP      Full           No       No                   sluggish flow  +---------+---------------+---------+-----------+----------+--------------+ PTV      Full                                                        +---------+---------------+---------+-----------+----------+--------------+ PERO     Full                                                        +---------+---------------+---------+-----------+----------+--------------+   +---------+---------------+---------+-----------+----------+-----------------+ LEFT     CompressibilityPhasicitySpontaneityPropertiesThrombus Aging     +---------+---------------+---------+-----------+----------+-----------------+ CFV      Partial        Yes      Yes                  Age Indeterminate +---------+---------------+---------+-----------+----------+-----------------+ SFJ      Full                                         Age Indeterminate +---------+---------------+---------+-----------+----------+-----------------+ FV Prox  Partial                                      Age Indeterminate +---------+---------------+---------+-----------+----------+-----------------+ FV Mid   Partial                                      Age Indeterminate +---------+---------------+---------+-----------+----------+-----------------+ FV DistalPartial                                      Age Indeterminate +---------+---------------+---------+-----------+----------+-----------------+ PFV      Full                                                           +---------+---------------+---------+-----------+----------+-----------------+ POP  Partial        Yes      No                   Age Indeterminate +---------+---------------+---------+-----------+----------+-----------------+ PTV      Full                                                           +---------+---------------+---------+-----------+----------+-----------------+ PERO     Full                                                           +---------+---------------+---------+-----------+----------+-----------------+     Summary: RIGHT: - There is no evidence of deep vein thrombosis in the lower extremity.  sluggish flow noted in popliteal vein  LEFT: - Findings consistent with age indeterminate deep vein thrombosis involving the left common femoral vein, SF junction, left femoral vein, and left popliteal vein.  *See table(s) above for measurements and observations.    Preliminary     Assessment/Plan: Pulmonary angiogram with bilateral pulmonary artery  suction thrombectomy.  Clinically much better. ASD Plans for cath and TEE noted. Call IR as needed.    LOS: 2 days   I spent a total of 20 minutes in face to face in clinical consultation, greater than 50% of which was counseling/coordinating care for PE thrombectomy  Ascencion Dike PA-C 12/31/2019 3:03 PM

## 2020-01-01 ENCOUNTER — Inpatient Hospital Stay (HOSPITAL_COMMUNITY): Payer: No Typology Code available for payment source

## 2020-01-01 ENCOUNTER — Encounter (HOSPITAL_COMMUNITY)
Admission: EM | Disposition: A | Discharge status: Home / Self Care | Payer: Self-pay | Source: Home / Self Care | Attending: Student

## 2020-01-01 DIAGNOSIS — N280 Ischemia and infarction of kidney: Secondary | ICD-10-CM | POA: Diagnosis not present

## 2020-01-01 DIAGNOSIS — I2609 Other pulmonary embolism with acute cor pulmonale: Secondary | ICD-10-CM | POA: Diagnosis not present

## 2020-01-01 DIAGNOSIS — I5021 Acute systolic (congestive) heart failure: Secondary | ICD-10-CM

## 2020-01-01 DIAGNOSIS — I483 Typical atrial flutter: Secondary | ICD-10-CM

## 2020-01-01 DIAGNOSIS — I749 Embolism and thrombosis of unspecified artery: Secondary | ICD-10-CM | POA: Diagnosis not present

## 2020-01-01 LAB — LACTIC ACID, PLASMA: Lactic Acid, Venous: 1.9 mmol/L (ref 0.5–1.9)

## 2020-01-01 LAB — CBC
HCT: 37.1 % — ABNORMAL LOW (ref 39.0–52.0)
Hemoglobin: 13.2 g/dL (ref 13.0–17.0)
MCH: 29.1 pg (ref 26.0–34.0)
MCHC: 35.6 g/dL (ref 30.0–36.0)
MCV: 81.7 fL (ref 80.0–100.0)
Platelets: 142 10*3/uL — ABNORMAL LOW (ref 150–400)
RBC: 4.54 MIL/uL (ref 4.22–5.81)
RDW: 16.4 % — ABNORMAL HIGH (ref 11.5–15.5)
WBC: 16.8 10*3/uL — ABNORMAL HIGH (ref 4.0–10.5)
nRBC: 24.7 % — ABNORMAL HIGH (ref 0.0–0.2)

## 2020-01-01 LAB — HEPATIC FUNCTION PANEL
ALT: 122 U/L — ABNORMAL HIGH (ref 0–44)
AST: 91 U/L — ABNORMAL HIGH (ref 15–41)
Albumin: 1.8 g/dL — ABNORMAL LOW (ref 3.5–5.0)
Alkaline Phosphatase: 109 U/L (ref 38–126)
Bilirubin, Direct: 0.4 mg/dL — ABNORMAL HIGH (ref 0.0–0.2)
Indirect Bilirubin: 0.6 mg/dL (ref 0.3–0.9)
Total Bilirubin: 1 mg/dL (ref 0.3–1.2)
Total Protein: 6.2 g/dL — ABNORMAL LOW (ref 6.5–8.1)

## 2020-01-01 LAB — BASIC METABOLIC PANEL
Anion gap: 9 (ref 5–15)
BUN: 33 mg/dL — ABNORMAL HIGH (ref 6–20)
CO2: 22 mmol/L (ref 22–32)
Calcium: 8 mg/dL — ABNORMAL LOW (ref 8.9–10.3)
Chloride: 108 mmol/L (ref 98–111)
Creatinine, Ser: 1.73 mg/dL — ABNORMAL HIGH (ref 0.61–1.24)
GFR calc Af Amer: 49 mL/min — ABNORMAL LOW (ref >60)
GFR calc non Af Amer: 42 mL/min — ABNORMAL LOW (ref >60)
Glucose, Bld: 121 mg/dL — ABNORMAL HIGH (ref 70–99)
Potassium: 4.4 mmol/L (ref 3.5–5.1)
Sodium: 139 mmol/L (ref 135–145)

## 2020-01-01 LAB — TROPONIN I (HIGH SENSITIVITY)
Troponin I (High Sensitivity): 7544 ng/L (ref <18)
Troponin I (High Sensitivity): 11131 ng/L (ref <18)
Troponin I (High Sensitivity): 11154 ng/L (ref <18)
Troponin I (High Sensitivity): 10744 ng/L (ref <18)

## 2020-01-01 LAB — HEPARIN LEVEL (UNFRACTIONATED): Heparin Unfractionated: 0.41 [IU]/mL (ref 0.30–0.70)

## 2020-01-01 SURGERY — INVASIVE LAB ABORTED CASE

## 2020-01-01 MED ORDER — AMIODARONE HCL IN DEXTROSE 360-4.14 MG/200ML-% IV SOLN
60.0000 mg/h | INTRAVENOUS | Status: AC
  Administered 2020-01-01 – 2020-01-02 (×2): 60 mg/h via INTRAVENOUS
  Filled 2020-01-01: qty 200

## 2020-01-01 MED ORDER — SODIUM CHLORIDE 0.9 % IV SOLN: INTRAVENOUS | Status: DC

## 2020-01-01 MED ORDER — CARVEDILOL 3.125 MG PO TABS
3.1250 mg | ORAL_TABLET | Freq: Two times a day (BID) | ORAL | Status: DC
  Administered 2020-01-01 – 2020-01-07 (×10): 3.125 mg via ORAL
  Filled 2020-01-01 (×12): qty 1

## 2020-01-01 MED ORDER — METOPROLOL TARTRATE 5 MG/5ML IV SOLN
INTRAVENOUS | Status: DC | PRN
  Administered 2020-01-01: 5 mg via INTRAVENOUS

## 2020-01-01 MED ORDER — SODIUM CHLORIDE 0.9 % WEIGHT BASED INFUSION: 1.0000 mL/kg/h | INTRAVENOUS | Status: DC

## 2020-01-01 MED ORDER — METOPROLOL TARTRATE 5 MG/5ML IV SOLN
INTRAVENOUS | Status: AC
  Filled 2020-01-01: qty 5

## 2020-01-01 MED ORDER — ASPIRIN 81 MG PO CHEW
324.0000 mg | CHEWABLE_TABLET | Freq: Once | ORAL | Status: AC
  Administered 2020-01-01: 324 mg via ORAL
  Filled 2020-01-01: qty 4

## 2020-01-01 MED ORDER — SODIUM CHLORIDE 0.9 % WEIGHT BASED INFUSION: 3.0000 mL/kg/h | INTRAVENOUS | Status: DC

## 2020-01-01 MED ORDER — AMIODARONE LOAD VIA INFUSION
150.0000 mg | Freq: Once | INTRAVENOUS | Status: AC
  Administered 2020-01-01: 150 mg via INTRAVENOUS
  Filled 2020-01-01: qty 83.34

## 2020-01-01 MED ORDER — AMIODARONE HCL IN DEXTROSE 360-4.14 MG/200ML-% IV SOLN
30.0000 mg/h | INTRAVENOUS | Status: AC
  Administered 2020-01-02 – 2020-01-03 (×2): 30 mg/h via INTRAVENOUS
  Filled 2020-01-01 (×4): qty 200

## 2020-01-01 SURGICAL SUPPLY — 7 items
GLIDESHEATH SLEND SS 6F .021 (SHEATH) IMPLANT
GUIDEWIRE INQWIRE 1.5J.035X260 (WIRE) IMPLANT
INQWIRE 1.5J .035X260CM (WIRE)
KIT HEART LEFT (KITS) ×1 IMPLANT
PACK CARDIAC CATHETERIZATION (CUSTOM PROCEDURE TRAY) ×1 IMPLANT
TRANSDUCER W/STOPCOCK (MISCELLANEOUS) ×1 IMPLANT
TUBING CIL FLEX 10 FLL-RA (TUBING) ×1 IMPLANT

## 2020-01-01 NOTE — Progress Notes (Addendum)
   Dr Angelena Form suggested amio for HR/rhythm control.  Went to see pt and he had spontaneously converted to SR.  Reviewed situation w/ Dr Bronson Ing.  He recommends going ahead with the amio, to make sure pt stays in SR and can get cath tomorrow.   LFTs have elevated so amio may not be long-term med but will use short-term, ck TSH.  Rosaria Ferries, PA-C 01/01/2020 8:35 PM

## 2020-01-01 NOTE — Progress Notes (Signed)
ANTICOAGULATION CONSULT NOTE  Pharmacy Consult for Heparin Indication: pulmonary embolus  No Known Allergies  Patient Measurements: Height: 6\' 1"  (185.4 cm) Weight: 80.6 kg (177 lb 11.1 oz) IBW/kg (Calculated) : 79.9 Heparin Dosing Weight: 79.3 kg  Vital Signs: Temp: 98 F (36.7 C) (04/27 0800) Temp Source: Axillary (04/27 0800) BP: 122/93 (04/27 1635) Pulse Rate: 94 (04/27 1635)  Labs: Recent Labs    12/30/19 0620 12/30/19 0620 12/30/19 1902 12/31/19 0537 12/31/19 0537 12/31/19 1445 12/31/19 1858 12/31/19 2258 12/31/19 2258 01/01/20 0254 01/01/20 0555 01/01/20 0715  HGB 14.1   < >  --  12.9*  --   --   --   --   --  13.2  --   --   HCT 41.2  --   --  38.1*  --   --   --   --   --  37.1*  --   --   PLT 120*  --   --  147*  --   --   --   --   --  142*  --   --   LABPROT  --   --  18.8*  --   --   --   --   --   --   --   --   --   INR  --   --  1.6*  --   --   --   --   --   --   --   --   --   HEPARINUNFRC 0.31   < >  --  0.20*   < > 0.19*  --  0.35  --  0.41  --   --   CREATININE 1.96*  --   --  1.82*  --   --   --   --   --  1.73*  --   --   TROPONINIHS  --   --   --   --   --   --    < > 7,544*   < > 11,131* 11,154* 10,744*   < > = values in this interval not displayed.    Estimated Creatinine Clearance: 52 mL/min (A) (by C-G formula based on SCr of 1.73 mg/dL (H)).  Assessment: 59 y.o. male with PE continuing on heparin. Also noted to have left DVT. CBC stable.  Cardiac cath was cancelled for this afternoon after patient arrived in cath lab with aflutter with HR 140-150 and unable to lie flat on cath table. Heparin was not turned off at all before procedure per discussion with RN and continues running currently at previous therapeutic rate 1900 units/hr. No bleeding or issues with the infusion reported. Will recheck heparin with AM labs, as patient was previously therapeutic x 2 levels on this rate.   Goal of Therapy:  Heparin level 0.3-0.7  units/ml Monitor platelets by anticoagulation protocol: Yes   Plan:  Continue heparin at 1900 units/hr Recheck heparin level with AM labs Monitor daily heparin level and CBC, s/sx bleeding F/u Cardiology plans   Arturo Morton, PharmD, BCPS Please check AMION for all [facility] contact numbers Clinical Pharmacist 01/01/2020 5:08 PM

## 2020-01-01 NOTE — Progress Notes (Signed)
Cath lab note:  Mr. Lofgreen arrived to the cath lab in atrial flutter with heart rate 140-150 bpm. IV metoprolol given but his rate was not controlled. He was unable to lay flat on the cath table due to dyspnea. Pt wearing a Venturi mask.  Will cancel cardiac cath today. Will contact the cardiology rounding team and plan rate control strategies tonight. Resume IV heparin.  Cardiac cath later this week when heart rate controlled.   Lauree Chandler 01/01/2020 4:40 PM

## 2020-01-01 NOTE — Progress Notes (Signed)
  Amiodarone Drug - Drug Interaction Consult Note  Recommendations: Monitor for bradycardia while on conconmitant carvedilol   Amiodarone is metabolized by the cytochrome P450 system and therefore has the potential to cause many drug interactions. Amiodarone has an average plasma half-life of 50 days (range 20 to 100 days).   There is potential for drug interactions to occur several weeks or months after stopping treatment and the onset of drug interactions may be slow after initiating amiodarone.   []  Statins: Increased risk of myopathy. Simvastatin- restrict dose to 20mg  daily. Other statins: counsel patients to report any muscle pain or weakness immediately.  []  Anticoagulants: Amiodarone can increase anticoagulant effect. Consider warfarin dose reduction. Patients should be monitored closely and the dose of anticoagulant altered accordingly, remembering that amiodarone levels take several weeks to stabilize.  []  Antiepileptics: Amiodarone can increase plasma concentration of phenytoin, the dose should be reduced. Note that small changes in phenytoin dose can result in large changes in levels. Monitor patient and counsel on signs of toxicity.  [x]  Beta blockers: increased risk of bradycardia, AV block and myocardial depression. Sotalol - avoid concomitant use.  []   Calcium channel blockers (diltiazem and verapamil): increased risk of bradycardia, AV block and myocardial depression.  []   Cyclosporine: Amiodarone increases levels of cyclosporine. Reduced dose of cyclosporine is recommended.  []  Digoxin dose should be halved when amiodarone is started.  []  Diuretics: increased risk of cardiotoxicity if hypokalemia occurs.  []  Oral hypoglycemic agents (glyburide, glipizide, glimepiride): increased risk of hypoglycemia. Patient's glucose levels should be monitored closely when initiating amiodarone therapy.   []  Drugs that prolong the QT interval:  Torsades de pointes risk may be increased  with concurrent use - avoid if possible.  Monitor QTc, also keep magnesium/potassium WNL if concurrent therapy can't be avoided. [name] Antibiotics: e.g. fluoroquinolones, erythromycin. . Antiarrhythmics: e.g. quinidine, procainamide, disopyramide, sotalol. . Antipsychotics: e.g. phenothiazines, haloperidol.  . Lithium, tricyclic antidepressants, and methadone. Thank You,

## 2020-01-01 NOTE — Progress Notes (Addendum)
Progress Note  Patient Name: Cody Joyce Date of Encounter: 01/01/2020  Primary Cardiologist:  Sanda Klein, MD new  Subjective   Pt had CP last pm in the setting of atrial flutter, RVR. Got a GI cocktail. O2 sats decreased to 90% on HFNC>>back to Venturi mask  This am, pt not having CP, very sleepy on the mask, no SOB    Inpatient Medications    Scheduled Meds:  pantoprazole  40 mg Oral Daily   Continuous Infusions:  sodium chloride     heparin 1,900 Units/hr (01/01/20 0535)   PRN Meds: docusate sodium, ipratropium-albuterol, metoprolol tartrate, polyethylene glycol   Vital Signs    Vitals:   01/01/20 0324 01/01/20 0546 01/01/20 0650 01/01/20 0800  BP:    (!) 132/91  Pulse:      Resp:      Temp:    98 F (36.7 C)  TempSrc:    Axillary  SpO2:  94% 94% 100%  Weight: 80.6 kg     Height:        Intake/Output Summary (Last 24 hours) at 01/01/2020 1032 Last data filed at 01/01/2020 0551 Gross per 24 hour  Intake 152.94 ml  Output 1600 ml  Net -1447.06 ml   Filed Weights   12/29/19 1900 12/30/19 2033 01/01/20 0324  Weight: 84.3 kg 79.3 kg 80.6 kg   Last Weight  Most recent update: 01/01/2020  3:24 AM   Weight  80.6 kg (177 lb 11.1 oz)           Weight change: 1.3 kg   Telemetry    SR>>rapid Aflutter 2-10 pm>>SR>>rapid Aflutter 11:45 pm-2:45 am>>SR since then - Personally Reviewed  ECG    04/26 ECG is Atrial flutter, HR 132, no obvious acute changes 04/27 ECG is pending - Personally Reviewed  Physical Exam   General: Well developed, well nourished, male appearing in no acute distress. Head: Normocephalic, atraumatic.  Neck: Supple without bruits, JVD Not elevated. Lungs:  Resp regular and unlabored, few rales noted Heart: RRR, S1, S2, ?S3 or S4, soft murmur; no rub. Abdomen: Soft, non-tender, non-distended with normoactive bowel sounds. No hepatomegaly. No rebound/guarding. No obvious abdominal masses. Extremities: No clubbing,  cyanosis, no edema. Distal pedal pulses are 2+ bilaterally. Neuro: Sleepy but wakens easily and oriented X 3. Moves all extremities spontaneously. Psych: Normal affect.  Labs    Hematology Recent Labs  Lab 12/30/19 0620 12/31/19 0537 01/01/20 0254  WBC 20.8* 21.7* 16.8*  RBC 5.00 4.55 4.54  HGB 14.1 12.9* 13.2  HCT 41.2 38.1* 37.1*  MCV 82.4 83.7 81.7  MCH 28.2 28.4 29.1  MCHC 34.2 33.9 35.6  RDW 16.2* 16.4* 16.4*  PLT 120* 147* 142*    Chemistry Recent Labs  Lab 12/29/19 1633 12/29/19 2333 12/30/19 0620 12/31/19 0537 01/01/20 0254  NA 142   < > 143 141 139  K 5.4*   < > 5.5* 4.7 4.4  CL 110  --  111 111 108  CO2 19*  --  21* 20* 22  GLUCOSE 160*  --  129* 143* 121*  BUN 37*  --  43* 49* 33*  CREATININE 1.74*  --  1.96* 1.82* 1.73*  CALCIUM 8.7*  --  8.5* 7.9* 8.0*  PROT 6.8  --  6.8  --  6.2*  ALBUMIN 2.3*  --  2.1*  --  1.8*  AST 120*  --  109*  --  91*  ALT 143*  --  145*  --  122*  ALKPHOS 132*  --  113  --  109  BILITOT 1.6*  --  1.3*  --  1.0  GFRNONAA 42*  --  36* 40* 42*  GFRAA 49*  --  42* 46* 49*  ANIONGAP 13  --  11 10 9    < > = values in this interval not displayed.     High Sensitivity Troponin:   Recent Labs  Lab 12/31/19 1858 12/31/19 2258 01/01/20 0254 01/01/20 0555 01/01/20 0715  TROPONINIHS 3,000* 7,544* 11,131* 11,154* 10,744*      BNP Recent Labs  Lab 12/29/19 1633  BNP 1,086.8*     DDimer  Recent Labs  Lab 12/29/19 1633  DDIMER >20.00*     Radiology    CT Angio Chest PE W and/or Wo Contrast  Result Date: 12/29/2019 CLINICAL DATA:  Short of breath, right-sided chest and rib pain, recent history of trauma EXAM: CT ANGIOGRAPHY CHEST WITH CONTRAST TECHNIQUE: Multidetector CT imaging of the chest was performed using the standard protocol during bolus administration of intravenous contrast. Multiplanar CT image reconstructions and MIPs were obtained to evaluate the vascular anatomy. CONTRAST:  147mL OMNIPAQUE IOHEXOL 350  MG/ML SOLN COMPARISON:  None. FINDINGS: Cardiovascular: This is a technically adequate evaluation of the pulmonary vasculature. There are large bilateral pulmonary emboli filling the main pulmonary arteries. There is straightening of the interventricular septum and mild dilation of the right ventricle, with RV/LV ratio measuring 1.3. Along the nondependent surface of the right ventral, actually better visualized on the corresponding abdominal CT, there is likely a here mural thrombus. Echocardiography may be useful for further evaluation. No pericardial effusion. The thoracic aorta is normal in caliber without aneurysm or dissection. Mediastinum/Nodes: No enlarged mediastinal, hilar, or axillary lymph nodes. Thyroid gland, trachea, and esophagus demonstrate no significant findings. Lungs/Pleura: There is background emphysema. Bilateral areas of airspace disease are seen within the dependent upper lobes and superior segment of the bilateral lower lobes. Ground-glass opacities are seen within the dependent lower lobes. No effusion or pneumothorax. Central airways are patent. Upper Abdomen: No acute abnormality. Musculoskeletal: No acute or destructive bony lesions. Reconstructed images demonstrate no additional findings. Review of the MIP images confirms the above findings. IMPRESSION: 1. Large bilateral pulmonary emboli with CT evidence of right heart strain (RV/LV Ratio = 1.3) consistent with at least submassive (intermediate risk) PE. The presence of right heart strain has been associated with an increased risk of morbidity and mortality. 2. Likely adherent mural thrombus along the anterior wall the right ventricle, better visualized on the corresponding abdominal CT. 3. Bilateral areas of consolidation and ground-glass airspace disease, superimposed upon background emphysema. Findings favor pulmonary edema with developing pulmonary infarctions. 4.  Emphysema (ICD10-J43.9). These results were called by telephone at  the time of interpretation on 12/29/2019 at 7:07 pm to provider Dr. Melina Copa, who verbally acknowledged these results. Electronically Signed   By: Randa Ngo M.D.   On: 12/29/2019 19:08   CT ABDOMEN PELVIS W CONTRAST  Result Date: 12/29/2019 CLINICAL DATA:  History of abdominal trauma, shortness of breath, right upper quadrant pain EXAM: CT ABDOMEN AND PELVIS WITH CONTRAST TECHNIQUE: Multidetector CT imaging of the abdomen and pelvis was performed using the standard protocol following bolus administration of intravenous contrast. CONTRAST:  168mL OMNIPAQUE IOHEXOL 350 MG/ML SOLN COMPARISON:  None. FINDINGS: Lower chest: Large bilateral pulmonary emboli are seen. Diffuse ground-glass opacities throughout the lung bases. Please refer to corresponding CT chest report for important discussion of these findings. Hepatobiliary: No hepatic injury  or perihepatic hematoma. Gallbladder is unremarkable Pancreas: Unremarkable. No pancreatic ductal dilatation or surrounding inflammatory changes. Spleen: No splenic injury or perisplenic hematoma. Adrenals/Urinary Tract: There are multiple wedge-shaped hypodensities within the left kidney consistent with renal infarcts. There is no surrounding fluid or perinephric fat stranding to suggest posttraumatic etiology. The right kidney enhances normally. The adrenals are unremarkable. Bladder is grossly normal. Stomach/Bowel: No bowel obstruction or ileus. Normal appendix right lower quadrant. No bowel wall thickening or inflammatory change. Vascular/Lymphatic: Minimal atherosclerosis of the abdominal aorta. The visualized vascular structures opacify normally. No pathologic adenopathy. Reproductive: Prostate is unremarkable. Other: No free fluid or free gas. No abdominal wall hernia. No evidence of soft tissue injury related to recent trauma. Musculoskeletal: No acute or destructive bony lesions. Reconstructed images demonstrate no additional findings. IMPRESSION: 1. Multiple left  renal cortical infarcts. 2. Large bilateral pulmonary emboli. Please refer to corresponding CT chest report for important discussion of these findings. 3. No evidence of intra-abdominal or intrapelvic trauma. 4. Aortic Atherosclerosis (ICD10-I70.0). These results were called by telephone at the time of interpretation on 12/29/2019 at 7:03 pm to provider Dr. Melina Copa, who verbally acknowledged these results. Electronically Signed   By: Randa Ngo M.D.   On: 12/29/2019 19:08   IR Angiogram Pulmonary Bilateral Selective  Result Date: 12/30/2019 EXAM: ADDITIONAL ARTERIOGRAPHY; IR ULTRASOUND GUIDANCE VASC ACCESS RIGHT; THROMBECTOMY MECHANICAL; BILATERAL PULMONARY ARTERIOGRAPHY COMPARISON:  CT a chest 12/29/2019 MEDICATIONS: 10000 units heparin ANESTHESIA/SEDATION: Versed 2.5 mg IV; Fentanyl 125 mcg IV Moderate Sedation Time:  107 minutes The patient was continuously monitored during the procedure by the interventional radiology nurse under my direct supervision. FLUOROSCOPY TIME:  Fluoroscopy Time: 28 minutes 54 seconds (344 mGy). COMPLICATIONS: None immediate. TECHNIQUE: Informed written consent was obtained from the patient after a thorough discussion of the procedural risks, benefits and alternatives. All questions were addressed. Maximal Sterile Barrier Technique was utilized including caps, mask, sterile gowns, sterile gloves, sterile drape, hand hygiene and skin antiseptic. A timeout was performed prior to the initiation of the procedure. The right common femoral vein was interrogated with ultrasound and found to be widely patent. An image was obtained and stored for the medical record. Local anesthesia was attained by infiltration with 1% lidocaine. A small dermatotomy was made. Under real-time sonographic guidance, the vessel was punctured with a 21 gauge micropuncture needle. Using standard technique, the initial micro needle was exchanged over a 0.018 micro wire for a transitional 4 [place] micro sheath.  The micro sheath was then exchanged over a 0.035 wire for a 6 French 11 cm vascular sheath. A right iliac venogram was performed through the sheath. No evidence of iliac or caval DVT. An angled pigtail catheter was advanced over a Bentson wire and navigated through the heart and into the main pulmonary artery. The main pulmonary arterial pressure was measured at 42/28 (mean 28) mm Hg. The catheter was navigated into the right main pulmonary artery. Pulmonary arteriography was performed. There is large volume thrombus within the right main pulmonary artery extending into the right lower lobe and segmental pulmonary arteries. Very poor overall perfusion of the right middle and lower lobes. There is adequate perfusion of the right upper lobe. The pigtail catheter was exchanged over a rose in wire for an angled catheter. The angled catheter was successfully navigated into a right lower lobe peripheral branch. Pulmonary arteriography was performed confirming catheter location and that the vessel was of adequate size. An Amplatz wire was advanced. The catheter was removed. The 24  [place] In[place] aspiration catheter was then successfully advanced over the wire and positioned in the right main pulmonary artery. Suction aspiration was performed. There was extirpation of thrombus material from the right lower lobe pulmonary artery. Follow-up pulmonary arteriography demonstrates persistent and likely chronic thrombus adherent to the inferior wall of the right main pulmonary artery. The Nitinol discs were advanced through the catheter and used to agitated the residual chronic thrombus. This was performed twice. Additional aspiration was performed with extirpation of additional thrombus. Follow-up venography demonstrates improved flow into the right lower lobe pulmonary artery with improved parenchymal blush, particularly in the right lower lobe. There is still persistent residual chronic thrombus along the inferior wall of the  right lower lobe pulmonary artery. The aspiration catheter was brought back into the main pulmonary artery. The angled catheter was introduced coaxially in used to select the left main pulmonary artery. Arteriography was performed. There is large volume nearly occlusive thrombus in the left main pulmonary artery extending into the left lower pulmonary artery. The angled catheter was successfully advanced into a peripheral branch of the left lower lobe pulmonary artery. Arteriography was performed again confirming catheter location and terminal pulmonary arterial size. The Amplatz wire was advanced into the artery. The catheter was removed. The 63 French aspiration catheter was then advanced over the wire. Aspiration was performed without successful thrombus retrieval. Therefore, the 16 French aspiration catheter was advanced coaxially through the 24 French catheter and used to engage the thrombus. Aspiration was performed with successful extirpation of acute and chronic thrombus material. Follow-up arteriography demonstrates improved flow in the lower lobe pulmonary artery with increased parenchymal blush. There is some residual chronic thrombus remaining. However, at this point in the case, blood loss was approximately 500 mL. Repeat pulmonary arterial pressures were obtained. The mean main pulmonary arterial pressure has decreased 24 mm Hg. Patient is doing well and hemodynamically stable. The decision was made to pursue no further attempts at extirpation of the residual chronic thrombus. FINDINGS: Pre thrombectomy main mean pulmonary arterial pressure 28 mm Hg. Post extirpation mean pulmonary arterial pressure 24 mm Hg. IMPRESSION: 1. Large volume bilateral pulmonary emboli with resultant pulmonary arterial hypertension. 2. Successful extirpation of acute and chronic thrombus material from the bilateral main and lower lobe pulmonary arteries. 3. Improved pulmonary arterial hypertension. Signed, Criselda Peaches, MD, [facility] Radiology Electronically Signed   By: Jacqulynn Cadet M.D.   On: 12/30/2019 15:41   IR Angiogram Selective Each Additional Vessel  Result Date: 12/30/2019 EXAM: ADDITIONAL ARTERIOGRAPHY; IR ULTRASOUND GUIDANCE VASC ACCESS RIGHT; THROMBECTOMY MECHANICAL; BILATERAL PULMONARY ARTERIOGRAPHY COMPARISON:  CT a chest 12/29/2019 MEDICATIONS: 10000 units heparin ANESTHESIA/SEDATION: Versed 2.5 mg IV; Fentanyl 125 mcg IV Moderate Sedation Time:  107 minutes The patient was continuously monitored during the procedure by the interventional radiology nurse under my direct supervision. FLUOROSCOPY TIME:  Fluoroscopy Time: 28 minutes 54 seconds (344 mGy). COMPLICATIONS: None immediate. TECHNIQUE: Informed written consent was obtained from the patient after a thorough discussion of the procedural risks, benefits and alternatives. All questions were addressed. Maximal Sterile Barrier Technique was utilized including caps, mask, sterile gowns, sterile gloves, sterile drape, hand hygiene and skin antiseptic. A timeout was performed prior to the initiation of the procedure. The right common femoral vein was interrogated with ultrasound and found to be widely patent. An image was obtained and stored for the medical record. Local anesthesia was attained by infiltration with 1% lidocaine. A small dermatotomy was made.  Under real-time sonographic guidance, the vessel was punctured with a 21 gauge micropuncture needle. Using standard technique, the initial micro needle was exchanged over a 0.018 micro wire for a transitional 4 [place] micro sheath. The micro sheath was then exchanged over a 0.035 wire for a 6 French 11 cm vascular sheath. A right iliac venogram was performed through the sheath. No evidence of iliac or caval DVT. An angled pigtail catheter was advanced over a Bentson wire and navigated through the heart and into the main pulmonary artery.  The main pulmonary arterial pressure was measured at 42/28 (mean 28) mm Hg. The catheter was navigated into the right main pulmonary artery. Pulmonary arteriography was performed. There is large volume thrombus within the right main pulmonary artery extending into the right lower lobe and segmental pulmonary arteries. Very poor overall perfusion of the right middle and lower lobes. There is adequate perfusion of the right upper lobe. The pigtail catheter was exchanged over a rose in wire for an angled catheter. The angled catheter was successfully navigated into a right lower lobe peripheral branch. Pulmonary arteriography was performed confirming catheter location and that the vessel was of adequate size. An Amplatz wire was advanced. The catheter was removed. The 24 [place] Inari aspiration catheter was then successfully advanced over the wire and positioned in the right main pulmonary artery. Suction aspiration was performed. There was extirpation of thrombus material from the right lower lobe pulmonary artery. Follow-up pulmonary arteriography demonstrates persistent and likely chronic thrombus adherent to the inferior wall of the right main pulmonary artery. The Nitinol discs were advanced through the catheter and used to agitated the residual chronic thrombus. This was performed twice. Additional aspiration was performed with extirpation of additional thrombus. Follow-up venography demonstrates improved flow into the right lower lobe pulmonary artery with improved parenchymal blush, particularly in the right lower lobe. There is still persistent residual chronic thrombus along the inferior wall of the right lower lobe pulmonary artery. The aspiration catheter was brought back into the main pulmonary artery. The angled catheter was introduced coaxially in used to select the left main pulmonary artery. Arteriography was performed. There is large volume nearly occlusive thrombus in the left main pulmonary artery  extending into the left lower pulmonary artery. The angled catheter was successfully advanced into a peripheral branch of the left lower lobe pulmonary artery. Arteriography was performed again confirming catheter location and terminal pulmonary arterial size. The Amplatz wire was advanced into the artery. The catheter was removed. The 21 French aspiration catheter was then advanced over the wire. Aspiration was performed without successful thrombus retrieval. Therefore, the 16 French aspiration catheter was advanced coaxially through the 24 French catheter and used to engage the thrombus. Aspiration was performed with successful extirpation of acute and chronic thrombus material. Follow-up arteriography demonstrates improved flow in the lower lobe pulmonary artery with increased parenchymal blush. There is some residual chronic thrombus remaining. However, at this point in the case, blood loss was approximately 500 mL. Repeat pulmonary arterial pressures were obtained. The mean main pulmonary arterial pressure has decreased 24 mm Hg. Patient is doing well and hemodynamically stable. The decision was made to pursue no further attempts at extirpation of the residual chronic thrombus. FINDINGS: Pre thrombectomy main mean pulmonary arterial pressure 28 mm Hg. Post extirpation mean pulmonary arterial pressure 24 mm Hg. IMPRESSION: 1. Large volume bilateral pulmonary emboli with resultant pulmonary arterial hypertension. 2. Successful extirpation of acute and chronic thrombus material from the bilateral main and lower  lobe pulmonary arteries. 3. Improved pulmonary arterial hypertension. Signed, Criselda Peaches, MD, [facility] Radiology Electronically Signed   By: Jacqulynn Cadet M.D.   On: 12/30/2019 15:41   IR Angiogram Selective Each Additional Vessel  Result Date: 12/30/2019 EXAM: ADDITIONAL ARTERIOGRAPHY; IR ULTRASOUND GUIDANCE VASC ACCESS RIGHT;  THROMBECTOMY MECHANICAL; BILATERAL PULMONARY ARTERIOGRAPHY COMPARISON:  CT a chest 12/29/2019 MEDICATIONS: 10000 units heparin ANESTHESIA/SEDATION: Versed 2.5 mg IV; Fentanyl 125 mcg IV Moderate Sedation Time:  107 minutes The patient was continuously monitored during the procedure by the interventional radiology nurse under my direct supervision. FLUOROSCOPY TIME:  Fluoroscopy Time: 28 minutes 54 seconds (344 mGy). COMPLICATIONS: None immediate. TECHNIQUE: Informed written consent was obtained from the patient after a thorough discussion of the procedural risks, benefits and alternatives. All questions were addressed. Maximal Sterile Barrier Technique was utilized including caps, mask, sterile gowns, sterile gloves, sterile drape, hand hygiene and skin antiseptic. A timeout was performed prior to the initiation of the procedure. The right common femoral vein was interrogated with ultrasound and found to be widely patent. An image was obtained and stored for the medical record. Local anesthesia was attained by infiltration with 1% lidocaine. A small dermatotomy was made. Under real-time sonographic guidance, the vessel was punctured with a 21 gauge micropuncture needle. Using standard technique, the initial micro needle was exchanged over a 0.018 micro wire for a transitional 4 [place] micro sheath. The micro sheath was then exchanged over a 0.035 wire for a 6 French 11 cm vascular sheath. A right iliac venogram was performed through the sheath. No evidence of iliac or caval DVT. An angled pigtail catheter was advanced over a Bentson wire and navigated through the heart and into the main pulmonary artery. The main pulmonary arterial pressure was measured at 42/28 (mean 28) mm Hg. The catheter was navigated into the right main pulmonary artery. Pulmonary arteriography was performed. There is large volume thrombus within the right main pulmonary artery extending into the right lower lobe and segmental pulmonary  arteries. Very poor overall perfusion of the right middle and lower lobes. There is adequate perfusion of the right upper lobe. The pigtail catheter was exchanged over a rose in wire for an angled catheter. The angled catheter was successfully navigated into a right lower lobe peripheral branch. Pulmonary arteriography was performed confirming catheter location and that the vessel was of adequate size. An Amplatz wire was advanced. The catheter was removed. The 24 [place] Inari aspiration catheter was then successfully advanced over the wire and positioned in the right main pulmonary artery. Suction aspiration was performed. There was extirpation of thrombus material from the right lower lobe pulmonary artery. Follow-up pulmonary arteriography demonstrates persistent and likely chronic thrombus adherent to the inferior wall of the right main pulmonary artery. The Nitinol discs were advanced through the catheter and used to agitated the residual chronic thrombus. This was performed twice. Additional aspiration was performed with extirpation of additional thrombus. Follow-up venography demonstrates improved flow into the right lower lobe pulmonary artery with improved parenchymal blush, particularly in the right lower lobe. There is still persistent residual chronic thrombus along the inferior wall of the right lower lobe pulmonary artery. The aspiration catheter was brought back into the main pulmonary artery. The angled catheter was introduced coaxially in used to select the left main pulmonary artery. Arteriography was performed. There is large volume nearly occlusive thrombus in the left main pulmonary artery extending into the left lower pulmonary artery. The angled  catheter was successfully advanced into a peripheral branch of the left lower lobe pulmonary artery. Arteriography was performed again confirming catheter location and terminal pulmonary arterial size. The Amplatz wire was advanced into the artery. The  catheter was removed. The 81 French aspiration catheter was then advanced over the wire. Aspiration was performed without successful thrombus retrieval. Therefore, the 16 French aspiration catheter was advanced coaxially through the 24 French catheter and used to engage the thrombus. Aspiration was performed with successful extirpation of acute and chronic thrombus material. Follow-up arteriography demonstrates improved flow in the lower lobe pulmonary artery with increased parenchymal blush. There is some residual chronic thrombus remaining. However, at this point in the case, blood loss was approximately 500 mL. Repeat pulmonary arterial pressures were obtained. The mean main pulmonary arterial pressure has decreased 24 mm Hg. Patient is doing well and hemodynamically stable. The decision was made to pursue no further attempts at extirpation of the residual chronic thrombus. FINDINGS: Pre thrombectomy main mean pulmonary arterial pressure 28 mm Hg. Post extirpation mean pulmonary arterial pressure 24 mm Hg. IMPRESSION: 1. Large volume bilateral pulmonary emboli with resultant pulmonary arterial hypertension. 2. Successful extirpation of acute and chronic thrombus material from the bilateral main and lower lobe pulmonary arteries. 3. Improved pulmonary arterial hypertension. Signed, Criselda Peaches, MD, [place] [facility] Radiology Electronically Signed   By: Jacqulynn Cadet M.D.   On: 12/30/2019 15:41   IR THROMBECT PRIM MECH INIT (INCLU) MOD SED  Result Date: 12/30/2019 EXAM: ADDITIONAL ARTERIOGRAPHY; IR ULTRASOUND GUIDANCE VASC ACCESS RIGHT; THROMBECTOMY MECHANICAL; BILATERAL PULMONARY ARTERIOGRAPHY COMPARISON:  CT a chest 12/29/2019 MEDICATIONS: 10000 units heparin ANESTHESIA/SEDATION: Versed 2.5 mg IV; Fentanyl 125 mcg IV Moderate Sedation Time:  107 minutes The patient was continuously monitored during the procedure by the interventional radiology nurse  under my direct supervision. FLUOROSCOPY TIME:  Fluoroscopy Time: 28 minutes 54 seconds (344 mGy). COMPLICATIONS: None immediate. TECHNIQUE: Informed written consent was obtained from the patient after a thorough discussion of the procedural risks, benefits and alternatives. All questions were addressed. Maximal Sterile Barrier Technique was utilized including caps, mask, sterile gowns, sterile gloves, sterile drape, hand hygiene and skin antiseptic. A timeout was performed prior to the initiation of the procedure. The right common femoral vein was interrogated with ultrasound and found to be widely patent. An image was obtained and stored for the medical record. Local anesthesia was attained by infiltration with 1% lidocaine. A small dermatotomy was made. Under real-time sonographic guidance, the vessel was punctured with a 21 gauge micropuncture needle. Using standard technique, the initial micro needle was exchanged over a 0.018 micro wire for a transitional 4 [place] micro sheath. The micro sheath was then exchanged over a 0.035 wire for a 6 French 11 cm vascular sheath. A right iliac venogram was performed through the sheath. No evidence of iliac or caval DVT. An angled pigtail catheter was advanced over a Bentson wire and navigated through the heart and into the main pulmonary artery. The main pulmonary arterial pressure was measured at 42/28 (mean 28) mm Hg. The catheter was navigated into the right main pulmonary artery. Pulmonary arteriography was performed. There is large volume thrombus within the right main pulmonary artery extending into the right lower lobe and segmental pulmonary arteries. Very poor overall perfusion of the right middle and lower lobes. There is adequate perfusion of the right upper lobe. The pigtail catheter was exchanged over a rose in wire for an angled catheter. The angled  catheter was successfully navigated into a right lower lobe peripheral branch. Pulmonary arteriography was  performed confirming catheter location and that the vessel was of adequate size. An Amplatz wire was advanced. The catheter was removed. The 24 [place] Inari aspiration catheter was then successfully advanced over the wire and positioned in the right main pulmonary artery. Suction aspiration was performed. There was extirpation of thrombus material from the right lower lobe pulmonary artery. Follow-up pulmonary arteriography demonstrates persistent and likely chronic thrombus adherent to the inferior wall of the right main pulmonary artery. The Nitinol discs were advanced through the catheter and used to agitated the residual chronic thrombus. This was performed twice. Additional aspiration was performed with extirpation of additional thrombus. Follow-up venography demonstrates improved flow into the right lower lobe pulmonary artery with improved parenchymal blush, particularly in the right lower lobe. There is still persistent residual chronic thrombus along the inferior wall of the right lower lobe pulmonary artery. The aspiration catheter was brought back into the main pulmonary artery. The angled catheter was introduced coaxially in used to select the left main pulmonary artery. Arteriography was performed. There is large volume nearly occlusive thrombus in the left main pulmonary artery extending into the left lower pulmonary artery. The angled catheter was successfully advanced into a peripheral branch of the left lower lobe pulmonary artery. Arteriography was performed again confirming catheter location and terminal pulmonary arterial size. The Amplatz wire was advanced into the artery. The catheter was removed. The 56 French aspiration catheter was then advanced over the wire. Aspiration was performed without successful thrombus retrieval. Therefore, the 16 French aspiration catheter was advanced coaxially through the 24 French catheter and used to engage the thrombus. Aspiration was performed with  successful extirpation of acute and chronic thrombus material. Follow-up arteriography demonstrates improved flow in the lower lobe pulmonary artery with increased parenchymal blush. There is some residual chronic thrombus remaining. However, at this point in the case, blood loss was approximately 500 mL. Repeat pulmonary arterial pressures were obtained. The mean main pulmonary arterial pressure has decreased 24 mm Hg. Patient is doing well and hemodynamically stable. The decision was made to pursue no further attempts at extirpation of the residual chronic thrombus. FINDINGS: Pre thrombectomy main mean pulmonary arterial pressure 28 mm Hg. Post extirpation mean pulmonary arterial pressure 24 mm Hg. IMPRESSION: 1. Large volume bilateral pulmonary emboli with resultant pulmonary arterial hypertension. 2. Successful extirpation of acute and chronic thrombus material from the bilateral main and lower lobe pulmonary arteries. 3. Improved pulmonary arterial hypertension. Signed, Criselda Peaches, MD, [place] [facility] Radiology Electronically Signed   By: Jacqulynn Cadet M.D.   On: 12/30/2019 15:41   IR THROMBECT PRIM MECH ADD (INCLU) MOD SED  Result Date: 12/30/2019 EXAM: ADDITIONAL ARTERIOGRAPHY; IR ULTRASOUND GUIDANCE VASC ACCESS RIGHT; THROMBECTOMY MECHANICAL; BILATERAL PULMONARY ARTERIOGRAPHY COMPARISON:  CT a chest 12/29/2019 MEDICATIONS: 10000 units heparin ANESTHESIA/SEDATION: Versed 2.5 mg IV; Fentanyl 125 mcg IV Moderate Sedation Time:  107 minutes The patient was continuously monitored during the procedure by the interventional radiology nurse under my direct supervision. FLUOROSCOPY TIME:  Fluoroscopy Time: 28 minutes 54 seconds (344 mGy). COMPLICATIONS: None immediate. TECHNIQUE: Informed written consent was obtained from the patient after a thorough discussion of the procedural risks, benefits and alternatives. All questions were addressed. Maximal  Sterile Barrier Technique was utilized including caps, mask, sterile gowns, sterile gloves, sterile drape, hand hygiene and skin antiseptic. A timeout was performed prior to the initiation of the  procedure. The right common femoral vein was interrogated with ultrasound and found to be widely patent. An image was obtained and stored for the medical record. Local anesthesia was attained by infiltration with 1% lidocaine. A small dermatotomy was made. Under real-time sonographic guidance, the vessel was punctured with a 21 gauge micropuncture needle. Using standard technique, the initial micro needle was exchanged over a 0.018 micro wire for a transitional 4 [place] micro sheath. The micro sheath was then exchanged over a 0.035 wire for a 6 French 11 cm vascular sheath. A right iliac venogram was performed through the sheath. No evidence of iliac or caval DVT. An angled pigtail catheter was advanced over a Bentson wire and navigated through the heart and into the main pulmonary artery. The main pulmonary arterial pressure was measured at 42/28 (mean 28) mm Hg. The catheter was navigated into the right main pulmonary artery. Pulmonary arteriography was performed. There is large volume thrombus within the right main pulmonary artery extending into the right lower lobe and segmental pulmonary arteries. Very poor overall perfusion of the right middle and lower lobes. There is adequate perfusion of the right upper lobe. The pigtail catheter was exchanged over a rose in wire for an angled catheter. The angled catheter was successfully navigated into a right lower lobe peripheral branch. Pulmonary arteriography was performed confirming catheter location and that the vessel was of adequate size. An Amplatz wire was advanced. The catheter was removed. The 24 [place] Inari aspiration catheter was then successfully advanced over the wire and positioned in the right main pulmonary artery. Suction aspiration was performed. There was  extirpation of thrombus material from the right lower lobe pulmonary artery. Follow-up pulmonary arteriography demonstrates persistent and likely chronic thrombus adherent to the inferior wall of the right main pulmonary artery. The Nitinol discs were advanced through the catheter and used to agitated the residual chronic thrombus. This was performed twice. Additional aspiration was performed with extirpation of additional thrombus. Follow-up venography demonstrates improved flow into the right lower lobe pulmonary artery with improved parenchymal blush, particularly in the right lower lobe. There is still persistent residual chronic thrombus along the inferior wall of the right lower lobe pulmonary artery. The aspiration catheter was brought back into the main pulmonary artery. The angled catheter was introduced coaxially in used to select the left main pulmonary artery. Arteriography was performed. There is large volume nearly occlusive thrombus in the left main pulmonary artery extending into the left lower pulmonary artery. The angled catheter was successfully advanced into a peripheral branch of the left lower lobe pulmonary artery. Arteriography was performed again confirming catheter location and terminal pulmonary arterial size. The Amplatz wire was advanced into the artery. The catheter was removed. The 35 French aspiration catheter was then advanced over the wire. Aspiration was performed without successful thrombus retrieval. Therefore, the 16 French aspiration catheter was advanced coaxially through the 24 French catheter and used to engage the thrombus. Aspiration was performed with successful extirpation of acute and chronic thrombus material. Follow-up arteriography demonstrates improved flow in the lower lobe pulmonary artery with increased parenchymal blush. There is some residual chronic thrombus remaining. However, at this point in the case, blood loss was approximately 500 mL. Repeat pulmonary  arterial pressures were obtained. The mean main pulmonary arterial pressure has decreased 24 mm Hg. Patient is doing well and hemodynamically stable. The decision was made to pursue no further attempts at extirpation of the residual chronic thrombus. FINDINGS: Pre thrombectomy main mean pulmonary arterial  pressure 28 mm Hg. Post extirpation mean pulmonary arterial pressure 24 mm Hg. IMPRESSION: 1. Large volume bilateral pulmonary emboli with resultant pulmonary arterial hypertension. 2. Successful extirpation of acute and chronic thrombus material from the bilateral main and lower lobe pulmonary arteries. 3. Improved pulmonary arterial hypertension. Signed, Criselda Peaches, MD, [place] [facility] Radiology Electronically Signed   By: Jacqulynn Cadet M.D.   On: 12/30/2019 15:41   IR US Guide Vasc Access Right  Result Date: 12/30/2019 EXAM: ADDITIONAL ARTERIOGRAPHY; IR ULTRASOUND GUIDANCE VASC ACCESS RIGHT; THROMBECTOMY MECHANICAL; BILATERAL PULMONARY ARTERIOGRAPHY COMPARISON:  CT a chest 12/29/2019 MEDICATIONS: 10000 units heparin ANESTHESIA/SEDATION: Versed 2.5 mg IV; Fentanyl 125 mcg IV Moderate Sedation Time:  107 minutes The patient was continuously monitored during the procedure by the interventional radiology nurse under my direct supervision. FLUOROSCOPY TIME:  Fluoroscopy Time: 28 minutes 54 seconds (344 mGy). COMPLICATIONS: None immediate. TECHNIQUE: Informed written consent was obtained from the patient after a thorough discussion of the procedural risks, benefits and alternatives. All questions were addressed. Maximal Sterile Barrier Technique was utilized including caps, mask, sterile gowns, sterile gloves, sterile drape, hand hygiene and skin antiseptic. A timeout was performed prior to the initiation of the procedure. The right common femoral vein was interrogated with ultrasound and found to be widely patent. An image was obtained and stored for  the medical record. Local anesthesia was attained by infiltration with 1% lidocaine. A small dermatotomy was made. Under real-time sonographic guidance, the vessel was punctured with a 21 gauge micropuncture needle. Using standard technique, the initial micro needle was exchanged over a 0.018 micro wire for a transitional 4 [place] micro sheath. The micro sheath was then exchanged over a 0.035 wire for a 6 French 11 cm vascular sheath. A right iliac venogram was performed through the sheath. No evidence of iliac or caval DVT. An angled pigtail catheter was advanced over a Bentson wire and navigated through the heart and into the main pulmonary artery. The main pulmonary arterial pressure was measured at 42/28 (mean 28) mm Hg. The catheter was navigated into the right main pulmonary artery. Pulmonary arteriography was performed. There is large volume thrombus within the right main pulmonary artery extending into the right lower lobe and segmental pulmonary arteries. Very poor overall perfusion of the right middle and lower lobes. There is adequate perfusion of the right upper lobe. The pigtail catheter was exchanged over a rose in wire for an angled catheter. The angled catheter was successfully navigated into a right lower lobe peripheral branch. Pulmonary arteriography was performed confirming catheter location and that the vessel was of adequate size. An Amplatz wire was advanced. The catheter was removed. The 24 [place] Inari aspiration catheter was then successfully advanced over the wire and positioned in the right main pulmonary artery. Suction aspiration was performed. There was extirpation of thrombus material from the right lower lobe pulmonary artery. Follow-up pulmonary arteriography demonstrates persistent and likely chronic thrombus adherent to the inferior wall of the right main pulmonary artery. The Nitinol discs were advanced through the catheter and used to agitated the residual chronic thrombus. This  was performed twice. Additional aspiration was performed with extirpation of additional thrombus. Follow-up venography demonstrates improved flow into the right lower lobe pulmonary artery with improved parenchymal blush, particularly in the right lower lobe. There is still persistent residual chronic thrombus along the inferior wall of the right lower lobe pulmonary artery. The aspiration catheter was brought back into the main pulmonary artery.  The angled catheter was introduced coaxially in used to select the left main pulmonary artery. Arteriography was performed. There is large volume nearly occlusive thrombus in the left main pulmonary artery extending into the left lower pulmonary artery. The angled catheter was successfully advanced into a peripheral branch of the left lower lobe pulmonary artery. Arteriography was performed again confirming catheter location and terminal pulmonary arterial size. The Amplatz wire was advanced into the artery. The catheter was removed. The 57 French aspiration catheter was then advanced over the wire. Aspiration was performed without successful thrombus retrieval. Therefore, the 16 French aspiration catheter was advanced coaxially through the 24 French catheter and used to engage the thrombus. Aspiration was performed with successful extirpation of acute and chronic thrombus material. Follow-up arteriography demonstrates improved flow in the lower lobe pulmonary artery with increased parenchymal blush. There is some residual chronic thrombus remaining. However, at this point in the case, blood loss was approximately 500 mL. Repeat pulmonary arterial pressures were obtained. The mean main pulmonary arterial pressure has decreased 24 mm Hg. Patient is doing well and hemodynamically stable. The decision was made to pursue no further attempts at extirpation of the residual chronic thrombus. FINDINGS: Pre thrombectomy main mean pulmonary arterial pressure 28 mm Hg. Post  extirpation mean pulmonary arterial pressure 24 mm Hg. IMPRESSION: 1. Large volume bilateral pulmonary emboli with resultant pulmonary arterial hypertension. 2. Successful extirpation of acute and chronic thrombus material from the bilateral main and lower lobe pulmonary arteries. 3. Improved pulmonary arterial hypertension. Signed, Criselda Peaches, MD, [place] [facility] Radiology Electronically Signed   By: Jacqulynn Cadet M.D.   On: 12/30/2019 15:41   DG Chest Port 1 View  Result Date: 12/29/2019 CLINICAL DATA:  Hypoxia shortness of breath. EXAM: PORTABLE CHEST 1 VIEW COMPARISON:  07/25/2018 FINDINGS: Globular appearance of cardiac silhouette with cardiac enlargement that is likely also accentuated by portable technique. Hilar structures are unremarkable. Patchy opacities in the RIGHT and LEFT mid chest. No dense consolidation. No sign of pleural effusion. Visualized skeletal structures are unremarkable. IMPRESSION: Patchy opacities in the RIGHT and LEFT mid chest, potentially related to developing infection, viral or atypical process. Given history of trauma on the side of trauma small area of contusion is also considered. Globular cardiac silhouette.  Pericardial effusion not excluded. Electronically Signed   By: Zetta Bills M.D.   On: 12/29/2019 17:32   ECHOCARDIOGRAM COMPLETE  Result Date: 12/30/2019    ECHOCARDIOGRAM LIMITED REPORT   Patient Name:   Cody Joyce Date of Exam: 12/30/2019 Medical Rec #:  [id number]       Height:       73.0 in Accession #:    [id number]      Weight:       185.9 lb Date of Birth:  Mar 09, 1961        BSA:          2.086 m Patient Age:    71 years        BP:           142/106 mmHg Patient Gender: M               HR:           90 bpm. Exam Location:  Inpatient Procedure: 2D Echo, Cardiac Doppler and Color Doppler Indications:    I26.02 Pulmonary embolus  History:        Patient has no prior history of Echocardiogram  examinations.  Sonographer:  [place] Referring Phys: [id number] [place]  1. Left ventricular ejection fraction, by estimation, is 35 to 40%. The left ventricle has moderately decreased function. The left ventricle demonstrates global hypokinesis. Left ventricular diastolic parameters are consistent with Grade I diastolic dysfunction (impaired relaxation). There is the interventricular septum is flattened in systole and diastole, consistent with right ventricular pressure and volume overload.  2. Right ventricular systolic function is moderately reduced.  3. Right atrial size was moderately dilated.  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 Trivial mitral valve regurgitation.  5. Unable to obtain adequate Doppler signal to estimate pulmonary pressures. Tricuspid valve regurgitation is trivial. FINDINGS  Left Ventricle: Left ventricular ejection fraction, by estimation, is 35 to 40%. The left ventricle has moderately decreased function. The left ventricle demonstrates global hypokinesis. The interventricular septum is flattened in systole and diastole, consistent with right ventricular pressure and volume overload. Right Ventricle: Right ventricular systolic function is moderately reduced. Right Atrium: Right atrial size was moderately dilated. Mitral Valve: The mitral valve is myxomatous. There is moderate thickening of the mitral valve leaflet(s). There is moderate calcification of the mitral valve leaflet(s). Trivial mitral valve regurgitation. Tricuspid Valve: Unable to obtain adequate Doppler signal to estimate pulmonary pressures. The tricuspid valve is normal in structure. Tricuspid valve regurgitation is trivial. Pulmonic Valve: Pulmonic valve regurgitation is mild.  LEFT VENTRICLE PLAX 2D LVIDd:         4.40 cm     Diastology LVIDs:         4.50 cm     LV e' lateral:   6.09 cm/s LV PW:         0.90 cm     LV E/e' lateral: 5.6 LV IVS:        1.00 cm     LV e' medial:    4.68 cm/s LVOT  diam:     2.00 cm     LV E/e' medial:  7.2 LV SV:         30 LV SV Index:   14 LVOT Area:     3.14 cm  LV Volumes (MOD) LV vol d, MOD A2C: 97.5 ml LV vol d, MOD A4C: 92.5 ml LV vol s, MOD A2C: 71.5 ml LV vol s, MOD A4C: 47.8 ml LV SV MOD A2C:     26.0 ml LV SV MOD A4C:     92.5 ml LV SV MOD BP:      37.6 ml RIGHT VENTRICLE            IVC RV Basal diam:  5.20 cm    IVC diam: 2.00 cm RV Mid diam:    4.90 cm RV S prime:     9.68 cm/s TAPSE (M-mode): 1.4 cm LEFT ATRIUM             Index       RIGHT ATRIUM           Index LA diam:        3.60 cm 1.73 cm/m  RA Area:     24.50 cm LA Vol (A2C):   71.4 ml 34.23 ml/m RA Volume:   89.10 ml  42.72 ml/m LA Vol (A4C):   32.8 ml 15.73 ml/m LA Biplane Vol: 49.0 ml 23.49 ml/m  AORTIC VALVE LVOT Vmax:   66.60 cm/s LVOT Vmean:  36.200 cm/s LVOT VTI:    0.096 m  AORTA Ao Root diam: 3.20 cm Ao Asc diam:  3.00 cm MITRAL VALVE MV  Area (PHT): 5.23 cm    SHUNTS MV Decel Time: 145 msec    Systemic VTI:  0.10 m MV E velocity: 33.80 cm/s  Systemic Diam: 2.00 cm MV A velocity: 55.70 cm/s MV E/A ratio:  0.61 Candee Furbish MD Electronically signed by Candee Furbish MD Signature Date/Time: 12/30/2019/2:26:36 PM    Final    ECHOCARDIOGRAM LIMITED BUBBLE STUDY  Result Date: 12/31/2019    ECHOCARDIOGRAM LIMITED REPORT   Patient Name:   Cody Joyce Date of Exam: 12/31/2019 Medical Rec #:  [id number]       Height:       73.0 in Accession #:    [id number]      Weight:       174.8 lb Date of Birth:  12-02-1960        BSA:          2.032 m Patient Age:    4 years        BP:           143/99 mmHg Patient Gender: M               HR:           91 bpm. Exam Location:  Inpatient Procedure: Limited Echo and Saline Contrast Bubble Study Indications:    Renal infarct  History:        Patient has prior history of Echocardiogram examinations, most                 recent 12/30/2019. COPD and Pulomary embolus;                 Signs/Symptoms:Shortness of Breath and Chest Pain. Resp.                 failure.   Sonographer:    Dustin Flock Referring Phys: [id number] Lance Coon R [place] IMPRESSIONS  1. Agitated saline contrast bubble study was positive with shunting observed within 5 cardiac cycles suggestive of interatrial shunt.  2. Right ventricular systolic function is mildly reduced. The right ventricular size is moderately enlarged.  3. Remainder of findings per transthoracic echocardiogram 12/30/19. FINDINGS  Right Ventricle: The right ventricular size is moderately enlarged. Right ventricular systolic function is mildly reduced. IAS/Shunts: There is left bowing of the interatrial septum, suggestive of elevated right atrial pressure. Agitated saline contrast was given intravenously to evaluate for intracardiac shunting. Agitated saline contrast bubble study was positive with shunting observed within 3-6 cardiac cycles suggestive of interatrial shunt. Cherlynn Kaiser MD Electronically signed by Cherlynn Kaiser MD Signature Date/Time: 12/31/2019/6:16:01 PM    Final    VAS [place] LOWER EXTREMITY VENOUS (DVT)  Result Date: 12/31/2019  Lower Venous DVTStudy Indications: Pulmonary embolism, and recent left hamtring injury.  Comparison Study: No prior study on file Performing Technologist: Sharion Dove RVS  Examination Guidelines: A complete evaluation includes B-mode imaging, spectral Doppler, color Doppler, and power Doppler as needed of all accessible portions of each vessel. Bilateral testing is considered an integral part of a complete examination. Limited examinations for reoccurring indications may be performed as noted. The reflux portion of the exam is performed with the patient in reverse Trendelenburg.  +---------+---------------+---------+-----------+----------+--------------+  RIGHT     Compressibility Phasicity Spontaneity Properties Thrombus Aging  +---------+---------------+---------+-----------+----------+--------------+  CFV       Full            No        No                                      +---------+---------------+---------+-----------+----------+--------------+  SFJ       Full                                                             +---------+---------------+---------+-----------+----------+--------------+  FV Prox   Full                                                             +---------+---------------+---------+-----------+----------+--------------+  FV Mid    Full                                                             +---------+---------------+---------+-----------+----------+--------------+  FV Distal Full                                                             +---------+---------------+---------+-----------+----------+--------------+  PFV       Full                                                             +---------+---------------+---------+-----------+----------+--------------+  POP       Full            No        No                     sluggish flow   +---------+---------------+---------+-----------+----------+--------------+  PTV       Full                                                             +---------+---------------+---------+-----------+----------+--------------+  PERO      Full                                                             +---------+---------------+---------+-----------+----------+--------------+   +---------+---------------+---------+-----------+----------+-----------------+  LEFT      Compressibility Phasicity Spontaneity Properties Thrombus Aging     +---------+---------------+---------+-----------+----------+-----------------+  CFV       Partial         Yes       Yes                    Age Indeterminate  +---------+---------------+---------+-----------+----------+-----------------+  SFJ       Full                                             Age Indeterminate  +---------+---------------+---------+-----------+----------+-----------------+  FV Prox   Partial                                          Age Indeterminate   +---------+---------------+---------+-----------+----------+-----------------+  FV Mid    Partial                                          Age Indeterminate  +---------+---------------+---------+-----------+----------+-----------------+  FV Distal Partial                                          Age Indeterminate  +---------+---------------+---------+-----------+----------+-----------------+  PFV       Full                                                                +---------+---------------+---------+-----------+----------+-----------------+  POP       Partial         Yes       No                     Age Indeterminate  +---------+---------------+---------+-----------+----------+-----------------+  PTV       Full                                                                +---------+---------------+---------+-----------+----------+-----------------+  PERO      Full                                                                +---------+---------------+---------+-----------+----------+-----------------+     Summary: RIGHT: - There is no evidence of deep vein thrombosis in the lower extremity.  sluggish flow noted in popliteal vein  LEFT: - Findings consistent with age indeterminate deep vein thrombosis involving the left common femoral vein, SF junction, left femoral vein, and left popliteal vein.  *See table(s) above for measurements and observations. Electronically signed by Monica Martinez MD on 12/31/2019 at 4:40:35 PM.    Final      Cardiac Studies   ECHO:  12/30/2019 1. Left ventricular ejection fraction, by estimation, is 35 to 40%. The  left ventricle has moderately decreased function. The left ventricle  demonstrates global hypokinesis. Left ventricular diastolic parameters are  consistent with Grade I diastolic  dysfunction (impaired relaxation). There is the interventricular septum is  flattened in systole and diastole, consistent with right ventricular  pressure and volume  overload.  2. Right ventricular systolic function is moderately reduced.  3. Right atrial size was moderately dilated.  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 Trivial mitral valve regurgitation.  5. Unable to obtain adequate Doppler signal to estimate pulmonary  pressures. Tricuspid valve regurgitation is trivial.   ECHO BUBBLE STUDY: 12/31/2019 1. Agitated saline contrast bubble study was positive with shunting  observed within 5 cardiac cycles suggestive of interatrial shunt.  2. Right ventricular systolic function is mildly reduced. The right  ventricular size is moderately enlarged.  3. Remainder of findings per transthoracic echocardiogram 12/30/19.   FINDINGS  Right Ventricle: The right ventricular size is moderately enlarged. Right  ventricular systolic function is mildly reduced.   IAS/Shunts: There is left bowing of the interatrial septum, suggestive of  elevated right atrial pressure. Agitated saline contrast was given  intravenously to evaluate for intracardiac shunting. Agitated saline  contrast bubble study was positive with  shunting observed within 3-6 cardiac cycles suggestive of interatrial  shunt.   Patient Profile     59 y.o. male w/ hx tob, ETOH use was admitted 04/24 with severe hypoxia and shock>>submassive PE & renal cortical infarcts  Assessment & Plan    1. PE - s/p pulmonary angiogram, suction thrombectomy and possible catheter directed lysis by IR 04/25 - per IM  2. NSTEMI - pt had CP last pm, had an episode previously - initial trop elevation felt 2nd PE, but now trop much higher, c/w NSTEMI - Cardiac cath previously planned for tomorrow to eval LVD - however, w/ recurrent CP and markedly increased troponin, will change cath to today, pt agrees - give ASA 324 mg - continue heparin  - hold off on statin since LFTs abnl - add low-dose Coreg  3. Atrial flutter - new dx - spont conversion to SR - add BB - On heparin - CHA2DS2-VASc = 4   (CHF, DVT/PE x 2, NSTEMI) - Discuss oral anticoag after workup completed - likely the cause of the DVT/PE/Renal infarcts  4. 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 Cardiomyopathy - new dx - Cr too high for ACE/ARB - adding BB - no volume overload by exam  Active Problems:   Pulmonary embolism (HCC)   Acute pulmonary embolism with acute cor pulmonale (HCC)    Signed, Rosaria Ferries , PA-C 10:32 AM 01/01/2020 Pager: [phone]  I have seen and examined the patient along with Rosaria Ferries , PA-C.  I have reviewed the chart, notes and new data.  I agree with PA/NP's note.  Key new complaints: He has been in and out of atrial flutter a couple of times in the last 24 hours and feels worse (more dyspneic) and becomes more hypoxic during the arrhythmia.  Yesterday evening had some chest pain.  Denies any chest pain now. Key examination changes: When I started examining him he was in sinus rhythm but within a few minutes he converted to atrial flutter with 2: 1 AV block and a ventricular rate around 138 bpm.  At least for the time being he does not appear dyspneic.  He was not aware of palpitations. Key new findings / data: Troponin increased markedly following yesterday evening's events.  ECG shows atrial flutter with 2: 1 AV block, persistent S1Q3T3 and minor RV conduction abnormality, hard to evaluate  for any ischemic T wave changes due to very prominent flutter waves.  PLAN: It is possible that he has demand left ventricular injury due to pre-existing asymptomatic coronary artery disease, increased demand in the setting of acute pulmonary embolism and atrial flutter with rapid ventricular rates.  This might explain the otherwise surprising reduction in left ventricular systolic function. We will schedule for right and left heart catheterization today. This procedure has been fully reviewed with the patient and written informed consent has  been obtained. Cancel TEE today and reschedule when he is hemodynamically stable.   Sanda Klein, MD, [place] [phone] 01/01/2020, 2:05 PM

## 2020-01-01 NOTE — Progress Notes (Signed)
ANTICOAGULATION CONSULT NOTE  Pharmacy Consult for Heparin Indication: pulmonary embolus  No Known Allergies  Patient Measurements: Height: 6\' 1"  (185.4 cm) Weight: 79.3 kg (174 lb 13.2 oz) IBW/kg (Calculated) : 79.9 Heparin Dosing Weight: 79.3 kg  Vital Signs: Temp: 98.3 F (36.8 C) (04/26 2338) Temp Source: Oral (04/26 2338) BP: 122/94 (04/26 2338) Pulse Rate: 93 (04/26 2338)  Labs: Recent Labs    12/29/19 1633 12/29/19 1633 12/29/19 2221 12/29/19 2221 12/29/19 2333 12/30/19 0620 12/30/19 0620 12/30/19 1902 12/31/19 0537 12/31/19 1445 12/31/19 1858 12/31/19 2258  HGB 14.6   < >  --    < > 15.0 14.1  --   --  12.9*  --   --   --   HCT 43.3   < >  --   --  44.0 41.2  --   --  38.1*  --   --   --   PLT 132*  --   --   --   --  120*  --   --  147*  --   --   --   LABPROT  --   --   --   --   --   --   --  18.8*  --   --   --   --   INR  --   --   --   --   --   --   --  1.6*  --   --   --   --   HEPARINUNFRC  --   --  0.55   < >  --  0.31   < >  --  0.20* 0.19*  --  0.35  CREATININE 1.74*  --   --   --   --  1.96*  --   --  1.82*  --   --   --   TROPONINIHS 4,863*   < > 6,642*  --   --   --   --   --   --   --  3,000* 7,544*   < > = values in this interval not displayed.    Estimated Creatinine Clearance: 49 mL/min (A) (by C-G formula based on SCr of 1.82 mg/dL (H)).  Assessment: 59 y.o. male with PE for heparin. Also noted to have left DVT.  Heparin level is sub-therapeutic at 0.19 on rate of 1750 units/hr which is slightly lower than last level. Spoke with RN and there have been no problems with the infusion or bleeding. She noted the heparin was off for 1 min for the bubble study but this is not clinically significant.    4/27 AM update: heparin level therapeutic after rate increase  Goal of Therapy:  Heparin level 0.3-0.7 units/ml Monitor platelets by anticoagulation protocol: Yes   Plan:  Cont heparin at 1900 units/hr Confirmatory heparin level with AM  labs  Narda Bonds, PharmD, [place] Pharmacist Phone: [phone]

## 2020-01-01 NOTE — Progress Notes (Signed)
PROGRESS NOTE  Cody Joyce  DOB: 1961/02/04  PCP: Patient, No Pcp Per [id number]  DOA: 12/29/2019  LOS: 3 days   Chief Complaint  Patient presents with  . Shortness of Breath   Brief narrative: Cody Joyce is a 59 y.o. male with PMH of chronic everyday smoker, COPD, chronic alcoholism who had a tree branch fall on his right rib cage on 4/15, negative chest x-ray but since been less mobile. Patient presented to the ED on 12/29/2019 from home with complaint of shortness of breath, chest pain.  EMS noted oxygen saturation of 60% brought to the ED on NRB.  CT angio chest showed large bilateral pulmonary emboli with evidence of right heart strain and an adherent mural thrombus along the anterior wall of the RV with bilateral groundglass opacities likely developing pulmonary infarctions, underlying emphysema. CT abdomen pelvis showed multiple left renal cortical infarcts. Patient was admitted to critical care on IV heparin drip. 4/25, IR did Pulmonary angiogram with bilateral pulmonary artery suction thrombectomy of both pulmonary arteries.  Patient subsequently improved and was transferred to hospital service on 4/26.  Subjective: Patient was seen and examined this morning.  He is very sleepy on oxygen mask.   Event from last night noted.   Yesterday around 6:30 PM, patient had an episode of chest pain and tachycardia.  EKG showed sustained A. flutter.I gave him IV metoprolol 2.5mg  and schedule it every 6 hours.  Could not use Cardizem because of depressed LV function.  Troponin came to be significantly elevated to 11,000.    Assessment/Plan: Acute respiratory failure with hypoxia -Likely from a combination of pulmonary embolism, NSTEMI and new onset CHF. -Is currently on Ventimask.  Wean down as tolerated.  Submassive PE with acute cor pulmonale -Presented with shortness of breath and chest pain following 7 to 10 days of limited mobility after chest trauma. -CT angio chest  finding as above showing bilateral pulmonary embolism -4/25, IR did pulmonary angiogram with bilateral pulmonary artery suction thrombectomy of both pulmonary arteries.  -DVT scan showed left common femoral to left popliteal vein DVT. -Remains on IV heparin drip.  Plan is to switch to oral anticoagulant once cardiac procedures are completed.  NSTEMI -Patient presented with troponin of 6000.  It was initially attributed to pulmonary embolism and LV strain -However, repeat troponin on 4/26 showed further elevation to 11,000 while on heparin drip. -Patient may be having primary ischemic heart disease as well. -Cardiology following. -Planned for right heart and left heart catheter today.  New onset systolic CHF -Initial BNP more than 1000.  -Echo 4/25 showed EF decreased to 35 to 40% with global hypokinesis, dilated right heart chambers, moderate reduction in right ventricular function, pressure overload as well as volume overload.  -Per cardiology note, echo findings are strongly suggestive of an atrial septal defect.   -Patient is planned for TEE as well as left and right heart cath today.  New onset atrial flutter with RVR -Yesterday around 6:30 PM, patient had an episode of chest pain and tachycardia.  EKG showed sustained a flutter.  I started him on IV metoprolol 2.5 mg every 6 hours.  Could not use Cardizem because of depressed LV function. -Converted to sinus rhythm but again back to RVR this morning. -Cardiology follow-up appreciated.  He has been now started on Coreg 3.125 mg twice daily. -Anticoagulation after cardiac procedures.   Acute kidney injury Multiple left renal cortical infarcts -Baseline creatinine from 2019 normal -Presented with a creatinine of  1.74.  CT abdomen found multiple left renal cortical infarcts.  He might have paradoxical embolism through ASD or an embolism due to a flutter -Creatinine remains elevated at 1.73 today. -Continue to  monitor.  Leukocytosis -WBC count is improving, 16.8 today. -No fever.  No source of infection.  Not on antibiotics continue to monitor.  Elevated liver enzymes -Likely because of alcohol use.  Improving trend.  Continue to monitor -No evidence of liver cirrhosis and CT abdomen. -Avoid statin for now.  Recent Labs  Lab 12/29/19 1633 12/30/19 0620 01/01/20 0254  AST 120* 109* 91*  ALT 143* 145* 122*  ALKPHOS 132* 113 109  BILITOT 1.6* 1.3* 1.0  PROT 6.8 6.8 6.2*  ALBUMIN 2.3* 2.1* 1.8*   Hyperglycemia/prediabetes -A1c 6.4 on 4/25 -Counseled for dietary control.  COPD Chronic everyday smoker -Counseled to quit smoking.  Chronic alcoholism -Not in withdrawal.   -Counseled to quit alcohol.  Mobility: Encourage ambulation Code Status:  Full code  DVT prophylaxis:  On IV heparin drip Antimicrobials:  None Fluid: None Diet: N.p.o. for the procedure today.  Consultants: PCCM, IR Family Communication:  Spoke with patient's daughter at bedside.  Status is: Inpatient  Remains inpatient appropriate because:Ongoing diagnostic testing needed not appropriate for outpatient work up, IV treatments appropriate due to intensity of illness or inability to take PO and Inpatient level of care appropriate due to severity of illness   Dispo: The patient is from: Home              Anticipated d/c is to: Home              Anticipated d/c date is: 3 days              Patient currently is not medically stable to d/c.   Antimicrobials: Anti-infectives (From admission, onward)   Start     Dose/Rate Route Frequency Ordered Stop   12/29/19 1815  cefTRIAXone (ROCEPHIN) 1 g in sodium chloride 0.9 % 100 mL IVPB  Status:  Discontinued     1 g 200 mL/hr over 30 Minutes Intravenous  Once 12/29/19 1800 12/29/19 1903   12/29/19 1815  azithromycin (ZITHROMAX) 500 mg in sodium chloride 0.9 % 250 mL IVPB  Status:  Discontinued     500 mg 250 mL/hr over 60 Minutes Intravenous  Once 12/29/19 1800  12/29/19 1903        Code Status: Full Code   Diet Order            Diet NPO time specified  Diet effective now              Infusions:  . sodium chloride 20 mL/hr at 01/01/20 1057  . sodium chloride    . heparin 1,900 Units/hr (01/01/20 0535)    Scheduled Meds: . carvedilol  3.125 mg Oral BID WC  . pantoprazole  40 mg Oral Daily    PRN meds: docusate sodium, ipratropium-albuterol, metoprolol tartrate, polyethylene glycol   Objective: Vitals:   01/01/20 0650 01/01/20 0800  BP:  (!) 132/91  Pulse:    Resp:    Temp:  98 F (36.7 C)  SpO2: 94% 100%    Intake/Output Summary (Last 24 hours) at 01/01/2020 1454 Last data filed at 01/01/2020 0551 Gross per 24 hour  Intake 152.94 ml  Output 1600 ml  Net -1447.06 ml   Filed Weights   12/29/19 1900 12/30/19 2033 01/01/20 0324  Weight: 84.3 kg 79.3 kg 80.6 kg   Weight change: 1.3  kg Body mass index is 23.44 kg/m.   Physical Exam: General exam: Looks lethargic today.  Not in physical distress Skin: No rashes, lesions or ulcers. HEENT: Atraumatic, normocephalic, supple neck, no obvious bleeding Lungs: On Ventimask.  Clear to auscultation bilaterally  CVS: Irregular tachycardia, no murmur GI/Abd soft, nontender, nondistended, bowel sound present CNS: Alert, awake, oriented x3 Psychiatry: Mood appropriate Extremities: No pedal edema, no calf tenderness  Data Review: I have personally reviewed the laboratory data and studies available.  Recent Labs  Lab 12/29/19 1633 12/29/19 2333 12/30/19 0620 12/31/19 0537 01/01/20 0254  WBC 22.1*  --  20.8* 21.7* 16.8*  NEUTROABS 17.5*  --   --   --   --   HGB 14.6 15.0 14.1 12.9* 13.2  HCT 43.3 44.0 41.2 38.1* 37.1*  MCV 84.2  --  82.4 83.7 81.7  PLT 132*  --  120* 147* 142*   Recent Labs  Lab 12/29/19 1633 12/29/19 2333 12/30/19 0620 12/31/19 0537 01/01/20 0254  NA 142 139 143 141 139  K 5.4* 5.1 5.5* 4.7 4.4  CL 110  --  111 111 108  CO2 19*  --  21* 20*  22  GLUCOSE 160*  --  129* 143* 121*  BUN 37*  --  43* 49* 33*  CREATININE 1.74*  --  1.96* 1.82* 1.73*  CALCIUM 8.7*  --  8.5* 7.9* 8.0*  MG  --   --  2.6* 2.5*  --   PHOS  --   --  6.1* 3.6  --     Signed, Terrilee Croak, MD [facility] Pager: [phone] (Secure Chat preferred). 01/01/2020

## 2020-01-02 ENCOUNTER — Encounter (HOSPITAL_COMMUNITY)
Admission: EM | Disposition: A | Discharge status: Home / Self Care | Payer: Self-pay | Source: Home / Self Care | Attending: Student

## 2020-01-02 ENCOUNTER — Encounter (HOSPITAL_COMMUNITY): Payer: Self-pay | Admitting: Certified Registered Nurse Anesthetist

## 2020-01-02 ENCOUNTER — Ambulatory Visit (HOSPITAL_COMMUNITY)
Admission: RE | Admit: 2020-01-02 | Payer: No Typology Code available for payment source | Source: Home / Self Care | Admitting: Interventional Cardiology

## 2020-01-02 DIAGNOSIS — R748 Abnormal levels of other serum enzymes: Secondary | ICD-10-CM

## 2020-01-02 DIAGNOSIS — N280 Ischemia and infarction of kidney: Secondary | ICD-10-CM | POA: Diagnosis not present

## 2020-01-02 DIAGNOSIS — I2609 Other pulmonary embolism with acute cor pulmonale: Secondary | ICD-10-CM | POA: Diagnosis not present

## 2020-01-02 DIAGNOSIS — R7989 Other specified abnormal findings of blood chemistry: Secondary | ICD-10-CM

## 2020-01-02 DIAGNOSIS — J9601 Acute respiratory failure with hypoxia: Secondary | ICD-10-CM

## 2020-01-02 DIAGNOSIS — I214 Non-ST elevation (NSTEMI) myocardial infarction: Secondary | ICD-10-CM

## 2020-01-02 DIAGNOSIS — F17200 Nicotine dependence, unspecified, uncomplicated: Secondary | ICD-10-CM

## 2020-01-02 DIAGNOSIS — I749 Embolism and thrombosis of unspecified artery: Secondary | ICD-10-CM | POA: Diagnosis not present

## 2020-01-02 DIAGNOSIS — I5043 Acute on chronic combined systolic (congestive) and diastolic (congestive) heart failure: Secondary | ICD-10-CM | POA: Diagnosis not present

## 2020-01-02 DIAGNOSIS — F1010 Alcohol abuse, uncomplicated: Secondary | ICD-10-CM

## 2020-01-02 DIAGNOSIS — J432 Centrilobular emphysema: Secondary | ICD-10-CM

## 2020-01-02 DIAGNOSIS — I483 Typical atrial flutter: Secondary | ICD-10-CM | POA: Diagnosis not present

## 2020-01-02 DIAGNOSIS — N179 Acute kidney failure, unspecified: Secondary | ICD-10-CM

## 2020-01-02 HISTORY — PX: RIGHT/LEFT HEART CATH AND CORONARY ANGIOGRAPHY: CATH118266

## 2020-01-02 LAB — POCT I-STAT EG7
Acid-Base Excess: 0 mmol/L (ref 0.0–2.0)
Acid-Base Excess: 0 mmol/L (ref 0.0–2.0)
Acid-base deficit: 1 mmol/L (ref 0.0–2.0)
Acid-base deficit: 1 mmol/L (ref 0.0–2.0)
Bicarbonate: 23.3 mmol/L (ref 20.0–28.0)
Bicarbonate: 24 mmol/L (ref 20.0–28.0)
Bicarbonate: 24.3 mmol/L (ref 20.0–28.0)
Bicarbonate: 24.4 mmol/L (ref 20.0–28.0)
Calcium, Ion: 1.15 mmol/L (ref 1.15–1.40)
Calcium, Ion: 1.16 mmol/L (ref 1.15–1.40)
Calcium, Ion: 1.16 mmol/L (ref 1.15–1.40)
Calcium, Ion: 1.19 mmol/L (ref 1.15–1.40)
HCT: 35 % — ABNORMAL LOW (ref 39.0–52.0)
HCT: 35 % — ABNORMAL LOW (ref 39.0–52.0)
HCT: 36 % — ABNORMAL LOW (ref 39.0–52.0)
HCT: 36 % — ABNORMAL LOW (ref 39.0–52.0)
Hemoglobin: 11.9 g/dL — ABNORMAL LOW (ref 13.0–17.0)
Hemoglobin: 11.9 g/dL — ABNORMAL LOW (ref 13.0–17.0)
Hemoglobin: 12.2 g/dL — ABNORMAL LOW (ref 13.0–17.0)
Hemoglobin: 12.2 g/dL — ABNORMAL LOW (ref 13.0–17.0)
O2 Saturation: 51 %
O2 Saturation: 56 %
O2 Saturation: 61 %
O2 Saturation: 62 %
Potassium: 4.2 mmol/L (ref 3.5–5.1)
Potassium: 4.2 mmol/L (ref 3.5–5.1)
Potassium: 4.3 mmol/L (ref 3.5–5.1)
Potassium: 4.3 mmol/L (ref 3.5–5.1)
Sodium: 145 mmol/L (ref 135–145)
Sodium: 145 mmol/L (ref 135–145)
Sodium: 145 mmol/L (ref 135–145)
Sodium: 146 mmol/L — ABNORMAL HIGH (ref 135–145)
TCO2: 24 mmol/L (ref 22–32)
TCO2: 25 mmol/L (ref 22–32)
TCO2: 25 mmol/L (ref 22–32)
TCO2: 26 mmol/L (ref 22–32)
pCO2, Ven: 36.2 mmHg — ABNORMAL LOW (ref 44.0–60.0)
pCO2, Ven: 39.2 mmHg — ABNORMAL LOW (ref 44.0–60.0)
pCO2, Ven: 39.3 mmHg — ABNORMAL LOW (ref 44.0–60.0)
pCO2, Ven: 39.4 mmHg — ABNORMAL LOW (ref 44.0–60.0)
pH, Ven: 7.395 (ref 7.250–7.430)
pH, Ven: 7.398 (ref 7.250–7.430)
pH, Ven: 7.401 (ref 7.250–7.430)
pH, Ven: 7.416 (ref 7.250–7.430)
pO2, Ven: 27 mmHg — CL (ref 32.0–45.0)
pO2, Ven: 30 mmHg — CL (ref 32.0–45.0)
pO2, Ven: 32 mmHg (ref 32.0–45.0)
pO2, Ven: 32 mmHg (ref 32.0–45.0)

## 2020-01-02 LAB — POCT I-STAT 7, (LYTES, BLD GAS, ICA,H+H)
Acid-base deficit: 1 mmol/L (ref 0.0–2.0)
Bicarbonate: 22.9 mmol/L (ref 20.0–28.0)
Calcium, Ion: 1.18 mmol/L (ref 1.15–1.40)
HCT: 34 % — ABNORMAL LOW (ref 39.0–52.0)
Hemoglobin: 11.6 g/dL — ABNORMAL LOW (ref 13.0–17.0)
O2 Saturation: 94 %
Potassium: 4.3 mmol/L (ref 3.5–5.1)
Sodium: 144 mmol/L (ref 135–145)
TCO2: 24 mmol/L (ref 22–32)
pCO2 arterial: 33.3 mmHg (ref 32.0–48.0)
pH, Arterial: 7.446 (ref 7.350–7.450)
pO2, Arterial: 67 mmHg — ABNORMAL LOW (ref 83.0–108.0)

## 2020-01-02 LAB — COMPREHENSIVE METABOLIC PANEL
ALT: 86 U/L — ABNORMAL HIGH (ref 0–44)
AST: 59 U/L — ABNORMAL HIGH (ref 15–41)
Albumin: 1.6 g/dL — ABNORMAL LOW (ref 3.5–5.0)
Alkaline Phosphatase: 97 U/L (ref 38–126)
Anion gap: 19 — ABNORMAL HIGH (ref 5–15)
BUN: 33 mg/dL — ABNORMAL HIGH (ref 6–20)
CO2: 11 mmol/L — ABNORMAL LOW (ref 22–32)
Calcium: 7.6 mg/dL — ABNORMAL LOW (ref 8.9–10.3)
Chloride: 110 mmol/L (ref 98–111)
Creatinine, Ser: 1.74 mg/dL — ABNORMAL HIGH (ref 0.61–1.24)
GFR calc Af Amer: 49 mL/min — ABNORMAL LOW (ref >60)
GFR calc non Af Amer: 42 mL/min — ABNORMAL LOW (ref >60)
Glucose, Bld: 136 mg/dL — ABNORMAL HIGH (ref 70–99)
Potassium: 4.3 mmol/L (ref 3.5–5.1)
Sodium: 140 mmol/L (ref 135–145)
Total Bilirubin: 0.6 mg/dL (ref 0.3–1.2)
Total Protein: 5.7 g/dL — ABNORMAL LOW (ref 6.5–8.1)

## 2020-01-02 LAB — CBC
HCT: 33.3 % — ABNORMAL LOW (ref 39.0–52.0)
Hemoglobin: 11.8 g/dL — ABNORMAL LOW (ref 13.0–17.0)
MCH: 28.8 pg (ref 26.0–34.0)
MCHC: 35.4 g/dL (ref 30.0–36.0)
MCV: 81.2 fL (ref 80.0–100.0)
Platelets: 155 10*3/uL (ref 150–400)
RBC: 4.1 MIL/uL — ABNORMAL LOW (ref 4.22–5.81)
RDW: 16.5 % — ABNORMAL HIGH (ref 11.5–15.5)
WBC: 17 10*3/uL — ABNORMAL HIGH (ref 4.0–10.5)
nRBC: 5.7 % — ABNORMAL HIGH (ref 0.0–0.2)

## 2020-01-02 LAB — HEPARIN LEVEL (UNFRACTIONATED)
Heparin Unfractionated: 0.18 IU/mL — ABNORMAL LOW (ref 0.30–0.70)
Heparin Unfractionated: 0.57 IU/mL (ref 0.30–0.70)

## 2020-01-02 LAB — PATHOLOGIST SMEAR REVIEW

## 2020-01-02 LAB — TSH: TSH: 4.168 u[IU]/mL (ref 0.350–4.500)

## 2020-01-02 SURGERY — RIGHT/LEFT HEART CATH AND CORONARY ANGIOGRAPHY
Anesthesia: LOCAL

## 2020-01-02 SURGERY — CANCELLED PROCEDURE

## 2020-01-02 MED ORDER — IOHEXOL 350 MG/ML SOLN
INTRAVENOUS | Status: DC | PRN
  Administered 2020-01-02: 30 mL

## 2020-01-02 MED ORDER — MIDAZOLAM HCL 2 MG/2ML IJ SOLN
INTRAMUSCULAR | Status: DC | PRN
  Administered 2020-01-02 (×2): 0.5 mg via INTRAVENOUS

## 2020-01-02 MED ORDER — LIDOCAINE HCL (PF) 1 % IJ SOLN
INTRAMUSCULAR | Status: DC | PRN
  Administered 2020-01-02 (×2): 2 mL

## 2020-01-02 MED ORDER — SODIUM CHLORIDE 0.9% FLUSH
3.0000 mL | Freq: Two times a day (BID) | INTRAVENOUS | Status: DC
  Administered 2020-01-03 – 2020-01-11 (×17): 3 mL via INTRAVENOUS

## 2020-01-02 MED ORDER — HEPARIN SODIUM (PORCINE) 1000 UNIT/ML IJ SOLN
INTRAMUSCULAR | Status: DC | PRN
  Administered 2020-01-02: 4000 [IU] via INTRAVENOUS

## 2020-01-02 MED ORDER — HEPARIN (PORCINE) 25000 UT/250ML-% IV SOLN
2000.0000 [IU]/h | INTRAVENOUS | Status: DC
  Administered 2020-01-02 – 2020-01-03 (×2): 2000 [IU]/h via INTRAVENOUS
  Filled 2020-01-02 (×2): qty 250

## 2020-01-02 MED ORDER — ALUM & MAG HYDROXIDE-SIMETH 200-200-20 MG/5ML PO SUSP
30.0000 mL | ORAL | Status: DC | PRN
  Administered 2020-01-02: 30 mL via ORAL
  Filled 2020-01-02: qty 30

## 2020-01-02 MED ORDER — OXYCODONE HCL 5 MG PO TABS: 5.0000 mg | ORAL_TABLET | ORAL | Status: DC | PRN

## 2020-01-02 MED ORDER — HYDRALAZINE HCL 20 MG/ML IJ SOLN: 10.0000 mg | INTRAMUSCULAR | Status: AC | PRN

## 2020-01-02 MED ORDER — SODIUM CHLORIDE 0.9% FLUSH: 3.0000 mL | INTRAVENOUS | Status: DC | PRN

## 2020-01-02 MED ORDER — SODIUM CHLORIDE 0.9 % IV SOLN: INTRAVENOUS | Status: AC

## 2020-01-02 MED ORDER — FENTANYL CITRATE (PF) 100 MCG/2ML IJ SOLN
INTRAMUSCULAR | Status: DC | PRN
  Administered 2020-01-02 (×2): 25 ug via INTRAVENOUS

## 2020-01-02 MED ORDER — VERAPAMIL HCL 2.5 MG/ML IV SOLN
INTRAVENOUS | Status: DC | PRN
  Administered 2020-01-02: 10 mL via INTRA_ARTERIAL

## 2020-01-02 MED ORDER — HEPARIN (PORCINE) IN NACL 1000-0.9 UT/500ML-% IV SOLN
INTRAVENOUS | Status: DC | PRN
  Administered 2020-01-02 (×2): 500 mL

## 2020-01-02 MED ORDER — ONDANSETRON HCL 4 MG/2ML IJ SOLN: 4.0000 mg | Freq: Four times a day (QID) | INTRAMUSCULAR | Status: DC | PRN

## 2020-01-02 MED ORDER — LABETALOL HCL 5 MG/ML IV SOLN: 10.0000 mg | INTRAVENOUS | Status: AC | PRN

## 2020-01-02 MED ORDER — ACETAMINOPHEN 325 MG PO TABS: 650.0000 mg | ORAL_TABLET | ORAL | Status: DC | PRN

## 2020-01-02 MED ORDER — SODIUM CHLORIDE 0.9 % IV SOLN: 250.0000 mL | INTRAVENOUS | Status: DC | PRN

## 2020-01-02 SURGICAL SUPPLY — 13 items
CATH 5FR JL3.5 JR4 ANG PIG MP (CATHETERS) ×1 IMPLANT
CATH BALLN WEDGE 5F 110CM (CATHETERS) ×1 IMPLANT
DEVICE RAD COMP TR BAND LRG (VASCULAR PRODUCTS) ×1 IMPLANT
GLIDESHEATH SLEND A-KIT 6F 22G (SHEATH) ×1 IMPLANT
GUIDEWIRE .025 260CM (WIRE) ×1 IMPLANT
GUIDEWIRE INQWIRE 1.5J.035X260 (WIRE) IMPLANT
INQWIRE 1.5J .035X260CM (WIRE) ×2
KIT HEART LEFT (KITS) ×2 IMPLANT
PACK CARDIAC CATHETERIZATION (CUSTOM PROCEDURE TRAY) ×2 IMPLANT
SHEATH GLIDE SLENDER 4/5FR (SHEATH) ×2 IMPLANT
TRANSDUCER W/STOPCOCK (MISCELLANEOUS) ×2 IMPLANT
TUBING CIL FLEX 10 FLL-RA (TUBING) ×2 IMPLANT
WIRE EMERALD 3MM-J .025X260CM (WIRE) ×1 IMPLANT

## 2020-01-02 NOTE — CV Procedure (Signed)
   Real-time vascular ultrasound used for access to the right brachial vein and right radial artery using real-time vascular ultrasound.  Normal right heart pressures and mixed venous O2 saturations of ~ 60%.  Arterial O2 saturation on high flow oxygen was 95%.  No left-to-right shunting identified.  Unable to detect right to left shunting in the Cath Lab.  Normal coronary arteries.  Right dominant coronary anatomy.  Normal left ventricular end-diastolic pressure 16 mmHg  Mild pulmonary hypertension.  Technically unable to obtain pulmonary capillary wedge pressure.

## 2020-01-02 NOTE — Progress Notes (Signed)
Attempted to wean pt off venti-mask. Pt immediately desated.

## 2020-01-02 NOTE — Progress Notes (Signed)
ANTICOAGULATION CONSULT NOTE  Pharmacy Consult for Heparin Indication: pulmonary embolus  No Known Allergies  Patient Measurements: Height: 6\' 1"  (185.4 cm) Weight: 80.6 kg (177 lb 11.1 oz) IBW/kg (Calculated) : 79.9 Heparin Dosing Weight: 79.3 kg  Vital Signs: BP: 128/82 (04/28 0029) Pulse Rate: 96 (04/27 2115)  Labs: Recent Labs    12/30/19 0620 12/30/19 1902 12/31/19 0537 12/31/19 0537 12/31/19 1445 12/31/19 2258 01/01/20 0254 01/01/20 0555 01/01/20 0715 01/02/20 0231  HGB   < >  --  12.9*   < >  --   --  13.2  --   --  11.8*  HCT   < >  --  38.1*  --   --   --  37.1*  --   --  33.3*  PLT   < >  --  147*  --   --   --  142*  --   --  155  LABPROT  --  18.8*  --   --   --   --   --   --   --   --   INR  --  1.6*  --   --   --   --   --   --   --   --   HEPARINUNFRC   < >  --  0.20*  --    < > 0.35 0.41  --   --  0.18*  CREATININE   < >  --  1.82*  --   --   --  1.73*  --   --  1.74*  TROPONINIHS  --   --   --    < >   < > 7,544* 11,131* 11,154* 10,744*  --    < > = values in this interval not displayed.    Estimated Creatinine Clearance: 51.7 mL/min (A) (by C-G formula based on SCr of 1.74 mg/dL (H)).  Assessment: 59 y.o. male with PE/DVT for heparin  Goal of Therapy:  Heparin level 0.3-0.7 units/ml Monitor platelets by anticoagulation protocol: Yes   Plan:  Increase Heparin 2000 units/hr Check heparin level in 6 hours.   Phillis Knack, PharmD, BCPS  01/02/2020 4:41 AM

## 2020-01-02 NOTE — Progress Notes (Addendum)
Progress Note  Patient Name: Cody Joyce Date of Encounter: 01/02/2020  Primary Cardiologist: Sanda Klein, MD   Subjective   Patient is on 11-12L with Venturi mask. Able to lie flat. No chest pain. He is in sinus rhythm resting comfortably.  Inpatient Medications    Scheduled Meds: . carvedilol  3.125 mg Oral BID WC  . pantoprazole  40 mg Oral Daily   Continuous Infusions: . sodium chloride 50 mL/hr at 01/02/20 [id number]  . amiodarone 30 mg/hr (01/02/20 0250)  . heparin 2,000 Units/hr (01/02/20 0449)   PRN Meds: docusate sodium, ipratropium-albuterol, metoprolol tartrate, polyethylene glycol   Vital Signs    Vitals:   01/01/20 2111 01/01/20 2115 01/02/20 0029 01/02/20 0754  BP:  (!) 148/81 128/82 (!) 141/83  Pulse:  96  79  Resp:      Temp:    98.5 F (36.9 C)  TempSrc:    Oral  SpO2: 95%   100%  Weight:      Height:        Intake/Output Summary (Last 24 hours) at 01/02/2020 0945 Last data filed at 01/02/2020 0802 Gross per 24 hour  Intake 840.07 ml  Output 1600 ml  Net -759.93 ml   Last 3 Weights 01/01/2020 12/30/2019 12/29/2019  Weight (lbs) 177 lb 11.1 oz 174 lb 13.2 oz 185 lb 14.4 oz  Weight (kg) 80.6 kg 79.3 kg 84.324 kg      Telemetry    NSR since 17:00 4/27, Heart rates in the 80s - Personally Reviewed  ECG    No new - Personally Reviewed  Physical Exam   GEN: No acute distress.   Neck: Mild JVD Cardiac: RRR, no murmurs, rubs, or gallops.  Respiratory: Crackles at bases GI: Soft, nontender, non-distended  MS: No edema; No deformity. Neuro:  Nonfocal  Psych: Normal affect   Labs    High Sensitivity Troponin:   Recent Labs  Lab 12/31/19 1858 12/31/19 2258 01/01/20 0254 01/01/20 0555 01/01/20 0715  TROPONINIHS 3,000* 7,544* 11,131* 11,154* 10,744*      Chemistry Recent Labs  Lab 12/30/19 [id number] 12/30/19 [id number] 12/31/19 0537 01/01/20 0254 01/02/20 0231  NA 143   < > 141 139 140  K 5.5*   < > 4.7 4.4 4.3  CL 111   < > 111 108  110  CO2 21*   < > 20* 22 11*  GLUCOSE 129*   < > 143* 121* 136*  BUN 43*   < > 49* 33* 33*  CREATININE 1.96*   < > 1.82* 1.73* 1.74*  CALCIUM 8.5*   < > 7.9* 8.0* 7.6*  PROT 6.8  --   --  6.2* 5.7*  ALBUMIN 2.1*  --   --  1.8* 1.6*  AST 109*  --   --  91* 59*  ALT 145*  --   --  122* 86*  ALKPHOS 113  --   --  109 97  BILITOT 1.3*  --   --  1.0 0.6  GFRNONAA 36*   < > 40* 42* 42*  GFRAA 42*   < > 46* 49* 49*  ANIONGAP 11   < > 10 9 19*   < > = values in this interval not displayed.     Hematology Recent Labs  Lab 12/31/19 0537 01/01/20 0254 01/02/20 0231  WBC 21.7* 16.8* 17.0*  RBC 4.55 4.54 4.10*  HGB 12.9* 13.2 11.8*  HCT 38.1* 37.1* 33.3*  MCV 83.7 81.7 81.2  MCH 28.4 29.1  28.8  MCHC 33.9 35.6 35.4  RDW 16.4* 16.4* 16.5*  PLT 147* 142* 155    BNP Recent Labs  Lab 12/29/19 1633  BNP 1,086.8*     DDimer  Recent Labs  Lab 12/29/19 1633  DDIMER >20.00*     Radiology    ECHOCARDIOGRAM LIMITED BUBBLE STUDY  Result Date: 12/31/2019    ECHOCARDIOGRAM LIMITED REPORT   Patient Name:   Cody Joyce Date of Exam: 12/31/2019 Medical Rec #:  [id number]       Height:       73.0 in Accession #:    [id number]      Weight:       174.8 lb Date of Birth:  1961-08-10        BSA:          2.032 m Patient Age:    55 years        BP:           143/99 mmHg Patient Gender: M               HR:           91 bpm. Exam Location:  Inpatient Procedure: Limited Echo and Saline Contrast Bubble Study Indications:    Renal infarct  History:        Patient has prior history of Echocardiogram examinations, most                 recent 12/30/2019. COPD and Pulomary embolus;                 Signs/Symptoms:Shortness of Breath and Chest Pain. Resp.                 failure.  Sonographer:    Dustin Flock Referring Phys: [id number] Lance Coon R ELTARABOULSI IMPRESSIONS  1. Agitated saline contrast bubble study was positive with shunting observed within 5 cardiac cycles suggestive of interatrial shunt.  2.  Right ventricular systolic function is mildly reduced. The right ventricular size is moderately enlarged.  3. Remainder of findings per transthoracic echocardiogram 12/30/19. FINDINGS  Right Ventricle: The right ventricular size is moderately enlarged. Right ventricular systolic function is mildly reduced. IAS/Shunts: There is left bowing of the interatrial septum, suggestive of elevated right atrial pressure. Agitated saline contrast was given intravenously to evaluate for intracardiac shunting. Agitated saline contrast bubble study was positive with shunting observed within 3-6 cardiac cycles suggestive of interatrial shunt. Cherlynn Kaiser MD Electronically signed by Cherlynn Kaiser MD Signature Date/Time: 12/31/2019/6:16:01 PM    Final    VAS [place] LOWER EXTREMITY VENOUS (DVT)  Result Date: 12/31/2019  Lower Venous DVTStudy Indications: Pulmonary embolism, and recent left hamtring injury.  Comparison Study: No prior study on file Performing Technologist: Sharion Dove RVS  Examination Guidelines: A complete evaluation includes B-mode imaging, spectral Doppler, color Doppler, and power Doppler as needed of all accessible portions of each vessel. Bilateral testing is considered an integral part of a complete examination. Limited examinations for reoccurring indications may be performed as noted. The reflux portion of the exam is performed with the patient in reverse Trendelenburg.  +---------+---------------+---------+-----------+----------+--------------+ RIGHT    CompressibilityPhasicitySpontaneityPropertiesThrombus Aging +---------+---------------+---------+-----------+----------+--------------+ CFV      Full           No       No                                  +---------+---------------+---------+-----------+----------+--------------+  SFJ      Full                                                        +---------+---------------+---------+-----------+----------+--------------+ FV Prox   Full                                                        +---------+---------------+---------+-----------+----------+--------------+ FV Mid   Full                                                        +---------+---------------+---------+-----------+----------+--------------+ FV DistalFull                                                        +---------+---------------+---------+-----------+----------+--------------+ PFV      Full                                                        +---------+---------------+---------+-----------+----------+--------------+ POP      Full           No       No                   sluggish flow  +---------+---------------+---------+-----------+----------+--------------+ PTV      Full                                                        +---------+---------------+---------+-----------+----------+--------------+ PERO     Full                                                        +---------+---------------+---------+-----------+----------+--------------+   +---------+---------------+---------+-----------+----------+-----------------+ LEFT     CompressibilityPhasicitySpontaneityPropertiesThrombus Aging    +---------+---------------+---------+-----------+----------+-----------------+ CFV      Partial        Yes      Yes                  Age Indeterminate +---------+---------------+---------+-----------+----------+-----------------+ SFJ      Full                                         Age Indeterminate +---------+---------------+---------+-----------+----------+-----------------+ FV Prox  Partial  Age Indeterminate +---------+---------------+---------+-----------+----------+-----------------+ FV Mid   Partial                                      Age Indeterminate +---------+---------------+---------+-----------+----------+-----------------+ FV DistalPartial                                       Age Indeterminate +---------+---------------+---------+-----------+----------+-----------------+ PFV      Full                                                           +---------+---------------+---------+-----------+----------+-----------------+ POP      Partial        Yes      No                   Age Indeterminate +---------+---------------+---------+-----------+----------+-----------------+ PTV      Full                                                           +---------+---------------+---------+-----------+----------+-----------------+ PERO     Full                                                           +---------+---------------+---------+-----------+----------+-----------------+     Summary: RIGHT: - There is no evidence of deep vein thrombosis in the lower extremity.  sluggish flow noted in popliteal vein  LEFT: - Findings consistent with age indeterminate deep vein thrombosis involving the left common femoral vein, SF junction, left femoral vein, and left popliteal vein.  *See table(s) above for measurements and observations. Electronically signed by Monica Martinez MD on 12/31/2019 at 4:40:35 PM.    Final    ABORTED INVASIVE LAB PROCEDURE  Result Date: 01/01/2020 This case was aborted.   Cardiac Studies   Echo 12/31/19 1. Agitated saline contrast bubble study was positive with shunting  observed within 5 cardiac cycles suggestive of interatrial shunt.  2. Right ventricular systolic function is mildly reduced. The right  ventricular size is moderately enlarged.  3. Remainder of findings per transthoracic echocardiogram 12/30/19.   Echo 12/30/19 1. Left ventricular ejection fraction, by estimation, is 35 to 40%. The  left ventricle has moderately decreased function. The left ventricle  demonstrates global hypokinesis. Left ventricular diastolic parameters are  consistent with Grade I diastolic  dysfunction (impaired  relaxation). There is the interventricular septum is  flattened in systole and diastole, consistent with right ventricular  pressure and volume overload.  2. Right ventricular systolic function is moderately reduced.  3. Right atrial size was moderately dilated.  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 Trivial mitral valve regurgitation.  5. Unable to obtain adequate Doppler signal to estimate pulmonary  pressures. Tricuspid valve regurgitation is trivial.  Patient Profile   59 y.o. male with history if tobacco use  and ETOH admitted 4/24 with severe hypoxia and shock found to have submassive PE and renal cortical infarcts. Stay has been complicated by NSTEMI and Aflutter.   Assessment & Plan    PE - s/p pulmonary angiogram, suction thrombectomy and possible catheter directed lysis by IR 4/25 - per IM  NSTEMI - trop initial was 4,000 at first felt to be demand ischemia, however it continued to rise, peaked at 11,000 and felt to be NSTEMI - Aspirin - IV heparin - hold on statin for elevated LFTs - Low does Coreg added - Cardiac cath canceled yesterday for rapid alfutter and dyspnea, unable to lay flat  - Patient converted to NSR yesterday. He is on 12L O2 with Venturi mask. He can lay flat so likely OK to proceed with cardiac cath although unsure given sedation risk. MD to see  Atrial flutter - new dx - IV admiodarone started yesterday - Patient has been in NSR since last night - BB added - CHADSVASC = 4 (CHF, DVT/PE x 2, CAD). Patinet will need long-term a/c. 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 1.74 today  For questions or updates, please contact [place] Please consult [email or web address] for contact info under        Signed, Cadence Ninfa Meeker, PA-C  01/02/2020, 9:45 AM    I have seen and examined the patient along with Cadence Ninfa Meeker, PA-C .  I have  reviewed the chart, notes and new data.  I agree with PA/NP's note.  Key new complaints: remains hypoxic, but feels comfortable lying fully supine. Has intermittent atypical chest pain, not associate with moving or with cath. Radiates form subxiphoid area to left shoulder and last 2 minutes each time. Key examination changes: no pleural or pericardial rubs, RRR, no murmurs Key new findings / data: in NSR, no atrial flutter in last several hours.  PLAN: For R and LHC today to assess for CAD as cause of LV dysfunction and to assess for presence and size of shunt. Will also need TEE or cardiac MRI to assess ASD. I believe his hypoxemia is related to a significant degree of R to L shunt, probably interatrial, but of course he has also had a large PE and probably has significant VQ mismatch. Discussed with Dr. Tamala Julian, who will be performing the R and L heart cath later today.  Sanda Klein, MD, [place] [phone] 01/02/2020, 11:30 AM

## 2020-01-02 NOTE — Progress Notes (Signed)
TR BAND REMOVAL  LOCATION:    Right radial  DEFLATED PER PROTOCOL:    Yes.    TIME BAND OFF / DRESSING APPLIED:    1640pm a clean dressing with gauze, tegaderm and coban   SITE UPON ARRIVAL:    Level 0  SITE AFTER BAND REMOVAL:    Level 0  CIRCULATION SENSATION AND MOVEMENT:    Within Normal Limits   Yes.    COMMENTS:   Care instructions given to pt.

## 2020-01-02 NOTE — Progress Notes (Signed)
ANTICOAGULATION CONSULT NOTE  Pharmacy Consult for Heparin Indication: pulmonary embolus  No Known Allergies  Patient Measurements: Height: 6\' 1"  (185.4 cm) Weight: 80.6 kg (177 lb 11.1 oz) IBW/kg (Calculated) : 79.9 Heparin Dosing Weight: 79.3 kg  Vital Signs: Temp: 98.5 F (36.9 C) (04/28 0754) Temp Source: Oral (04/28 0754) BP: 141/83 (04/28 0754) Pulse Rate: 79 (04/28 0754)  Labs: Recent Labs     0000 12/30/19 1902 12/31/19 0537 12/31/19 1445 01/01/20 0254 01/01/20 0555 01/01/20 0715 01/02/20 0231 01/02/20 1200  HGB   < >  --  12.9*  --  13.2  --   --  11.8*  --   HCT  --   --  38.1*  --  37.1*  --   --  33.3*  --   PLT  --   --  147*  --  142*  --   --  155  --   LABPROT  --  18.8*  --   --   --   --   --   --   --   INR  --  1.6*  --   --   --   --   --   --   --   HEPARINUNFRC  --   --  0.20*   < > 0.41  --   --  0.18* 0.57  CREATININE  --   --  1.82*  --  1.73*  --   --  1.74*  --   TROPONINIHS  --   --   --    < > 11,131* 11,154* 10,744*  --   --    < > = values in this interval not displayed.    Estimated Creatinine Clearance: 51.7 mL/min (A) (by C-G formula based on SCr of 1.74 mg/dL (H)).  Assessment: 59 y.o. male with acute PE continuing on heparin. Also noted to have left DVT.  S/p cath 4/28. Pharmacy consulted to resume heparin 8 hours post-sheath removal. Sheath removed at 1422 per cath procedure log. Hg down to 11.8, plt wnl. No active bleed issues reported. Will resume at previous therapeutic rate 2000 units/hr.   Goal of Therapy:  Heparin level 0.3-0.7 units/ml Monitor platelets by anticoagulation protocol: Yes   Plan:  No bolus. Resume heparin at 2000 units/hr at 2230 (8hrs post-sheath removal) 6hr heparin level from resumption Monitor daily heparin level and CBC, s/sx bleeding F/u further Cardiology plans   Arturo Morton, PharmD, BCPS Please check AMION for all [place] contact numbers Clinical Pharmacist 01/02/2020 3:07  PM

## 2020-01-02 NOTE — Progress Notes (Signed)
PROGRESS NOTE  Cody Joyce [id number] DOB: 1960-12-22   PCP: Patient, No Pcp Per  Patient is from: home  DOA: 12/29/2019 LOS: 4  Brief Narrative / Interim history: 59 y.o. male with history of COPD, chronic alcoholism and tobacco abuse who had a tree branch fall on his right rib cage on 4/15, negative chest x-ray but since been less mobile. Patient presented to the ED on 12/29/2019 from home with complaint of shortness of breath, chest pain.  EMS noted oxygen saturation of 60% brought to the ED on NRB.  CT angio chest showed large bilateral pulmonary emboli with evidence of right heart strain and an adherent mural thrombus along the anterior wall of the RV with bilateral groundglass opacities likely developing pulmonary infarctions, underlying emphysema. CT abdomen pelvis showed multiple left renal cortical infarcts. Patient was admitted to critical care on IV heparin drip. 4/25, IR did Pulmonary angiogram with bilateral pulmonary artery suction thrombectomy of both pulmonary arteries. Patient subsequently improved and was transferred to hospital service on 4/26. R/LHC on 428 with mild pulmonary hypertension but normal coronaries, LVEDP and no evidence of left-to-right shunt.  Subjective: Seen and examined earlier this morning.  No major events overnight or this morning.  No complaints.  He denies chest pain, dyspnea, GI or UTI symptoms.  He says he now able to take deep breaths.   Objective: Vitals:   01/02/20 1620 01/02/20 1630 01/02/20 1635 01/02/20 1659  BP: (!) 155/85  (!) 147/85 112/82  Pulse: 80 79 80 82  Resp: (!) 23 (!) 21 14 14   Temp:    98.2 F (36.8 C)  TempSrc:    Oral  SpO2: (!) 86% 96% 96% 92%  Weight:      Height:        Intake/Output Summary (Last 24 hours) at 01/02/2020 1718 Last data filed at 01/02/2020 1628 Gross per 24 hour  Intake 1008.79 ml  Output 2050 ml  Net -1041.21 ml   Filed Weights   12/29/19 1900 12/30/19 2033 01/01/20 0324  Weight:  84.3 kg 79.3 kg 80.6 kg    Examination:  GENERAL: No apparent distress. Nontoxic.  HEENT: MMM.  Vision and hearing grossly intact.  NECK: Supple.  No apparent JVD.  RESP: On 10 L via Venturi mask.  No IWOB.  Fair air movement bilaterally. CVS:  RRR. Heart sounds normal.  ABD/GI/GU: BS present. Soft. Non tender.  MSK/EXT:  Moves extremities. No apparent deformity.  Trace edema. SKIN: no apparent skin lesion or wound NEURO: Awake, alert and oriented appropriately.  No apparent focal neuro deficit. PSYCH: Calm. Normal affect.   Procedures:  4/25-Pulmonary angiogram with bilateral pulmonary artery suction thrombectomy of both pulmonary arteries by IR. 4/28-R/LHC with normal coronaries and mildly elevated pulmonary artery pressures  Microbiology summarized: 4/24-COVID-19 PCR negative. 4/24-influenza PCR negative. 4/24-blood cultures negative.  Assessment & Plan: Acute respiratory failure with hypoxia-multifactorial including PE, new onset systolic CHF and possible NSTEMI.  He was on 10 L by Ventimask this morning.  He was weaned to room air this afternoon.  He is now 92% on room air.  R/LHC without significant finding. -Encourage incentive spirometry -OOB/PT/OT -Treat treatable causes as below.  Submassive PE with acute cor pulmonale/LLE common femoral and popliteal DVT-likely provoked in the setting of immobility after trauma -Bilateral pulmonary artery suction thrombectomy of both pulmonary arteries by IR on 4/25. -Remains on IV heparin drip.   May be able to switch to NOAC in the morning.  NSTEMI: HS Trop> 600 (  admit)>>> 11,000>.  R/LHC unrevealing.  Chest pain-free now. -On heparin drip and Coreg.  New onset systolic CHF: Echo on [id number] with EF of 35 to 40%, global hypokinesis, dilated right heart chamber, moderate range reduced RVEF with signs of pressure and volume overload.  BNP> 1000.  Clinically no significant signs of fluid overload.  R/LHC unrevealing. -Cardiology  managing. -TEE on 4/29.  New onset atrial flutter with RVR: Converted to sinus rhythm.  CHA2DS2-VASc score> 3. -Started on amiodarone and Coreg. -Already on heparin   AKI/mild azotemia Multiple left renal cortical infarcts -Baseline Cr normal> 1.96 (admit)>>>1.74 -CT abdomen found multiple left renal cortical infarcts. -Continue to monitor. -Avoid nephrotoxic meds.  Leukocytosis: Improved.  Likely demargination. -Continue monitoring  Elevated liver enzymes: Likely congestive hepatopathy from PE.  Alcohol could play role.  Improving. -Continue monitoring  Hyperglycemia/prediabetes: A1c 6.4%. -Counsel on lifestyle change including diet and exercise  COPD? -Continue as needed DuoNeb  Alcohol abuse: No withdrawal symptoms. -Encourage cessation  Tobacco use disorder -Encourage cessation.                   DVT prophylaxis: On heparin drip for PE/a flutter Code Status: Full code Family Communication: Patient and/or RN. Available if any question.  Discharge barrier: On heparin drip for PE.  Plan for TEE on 4/29 Patient is from: Home Final disposition: Likely home in the next 24 to 48 hours once cleared by consultants.  Consultants:  Cardiology IR PCCM   Sch Meds:  Scheduled Meds: . carvedilol  3.125 mg Oral BID WC  . pantoprazole  40 mg Oral Daily   Continuous Infusions: . sodium chloride 50 mL/hr at 01/02/20 1110  . amiodarone 30 mg/hr (01/02/20 1113)  . heparin     PRN Meds:.docusate sodium, ipratropium-albuterol, metoprolol tartrate, polyethylene glycol  Antimicrobials: Anti-infectives (From admission, onward)   Start     Dose/Rate Route Frequency Ordered Stop   12/29/19 1815  cefTRIAXone (ROCEPHIN) 1 g in sodium chloride 0.9 % 100 mL IVPB  Status:  Discontinued     1 g 200 mL/hr over 30 Minutes Intravenous  Once 12/29/19 1800 12/29/19 1903   12/29/19 1815  azithromycin (ZITHROMAX) 500 mg in sodium chloride 0.9 % 250 mL IVPB  Status:   Discontinued     500 mg 250 mL/hr over 60 Minutes Intravenous  Once 12/29/19 1800 12/29/19 1903       I have personally reviewed the following labs and images: CBC: Recent Labs  Lab 12/29/19 1633 12/29/19 2333 12/30/19 0620 12/30/19 0620 12/31/19 0537 12/31/19 0537 01/01/20 0254 01/01/20 0254 01/02/20 0231 01/02/20 0231 01/02/20 1401 01/02/20 1402 01/02/20 1407 01/02/20 1409 01/02/20 1416  WBC 22.1*  --  20.8*  --  21.7*  --  16.8*  --  17.0*  --   --   --   --   --   --   NEUTROABS 17.5*  --   --   --   --   --   --   --   --   --   --   --   --   --   --   HGB 14.6   < > 14.1   < > 12.9*   < > 13.2   < > 11.8*   < > 12.2* 11.9* 11.9* 12.2* 11.6*  HCT 43.3   < > 41.2   < > 38.1*   < > 37.1*   < > 33.3*   < > 36.0* 35.0* 35.0*  36.0* 34.0*  MCV 84.2  --  82.4  --  83.7  --  81.7  --  81.2  --   --   --   --   --   --   PLT 132*  --  120*  --  147*  --  142*  --  155  --   --   --   --   --   --    < > = values in this interval not displayed.   BMP &GFR Recent Labs  Lab 12/29/19 1633 12/29/19 2333 12/30/19 0620 12/30/19 0620 12/31/19 0537 12/31/19 0537 01/01/20 0254 01/01/20 0254 01/02/20 0231 01/02/20 0231 01/02/20 1401 01/02/20 1402 01/02/20 1407 01/02/20 1409 01/02/20 1416  NA 142   < > 143   < > 141   < > 139   < > 140   < > 145 145 146* 145 144  K 5.4*   < > 5.5*   < > 4.7   < > 4.4   < > 4.3   < > 4.2 4.3 4.2 4.3 4.3  CL 110  --  111  --  111  --  108  --  110  --   --   --   --   --   --   CO2 19*  --  21*  --  20*  --  22  --  11*  --   --   --   --   --   --   GLUCOSE 160*  --  129*  --  143*  --  121*  --  136*  --   --   --   --   --   --   BUN 37*  --  43*  --  49*  --  33*  --  33*  --   --   --   --   --   --   CREATININE 1.74*  --  1.96*  --  1.82*  --  1.73*  --  1.74*  --   --   --   --   --   --   CALCIUM 8.7*  --  8.5*  --  7.9*  --  8.0*  --  7.6*  --   --   --   --   --   --   MG  --   --  2.6*  --  2.5*  --   --   --   --   --   --   --    --   --   --   PHOS  --   --  6.1*  --  3.6  --   --   --   --   --   --   --   --   --   --    < > = values in this interval not displayed.   Estimated Creatinine Clearance: 51.7 mL/min (A) (by C-G formula based on SCr of 1.74 mg/dL (H)). Liver & Pancreas: Recent Labs  Lab 12/29/19 1633 12/30/19 0620 01/01/20 0254 01/02/20 0231  AST 120* 109* 91* 59*  ALT 143* 145* 122* 86*  ALKPHOS 132* 113 109 97  BILITOT 1.6* 1.3* 1.0 0.6  PROT 6.8 6.8 6.2* 5.7*  ALBUMIN 2.3* 2.1* 1.8* 1.6*   No results for input(s): LIPASE, AMYLASE in the last 168 hours. No results for input(s): AMMONIA in the last 168  hours. Diabetic: Recent Labs    12/31/19 0537  HGBA1C 6.3*   No results for input(s): GLUCAP in the last 168 hours. Cardiac Enzymes: No results for input(s): CKTOTAL, CKMB, CKMBINDEX, TROPONINI in the last 168 hours. No results for input(s): PROBNP in the last 8760 hours. Coagulation Profile: Recent Labs  Lab 12/30/19 1902  INR 1.6*   Thyroid Function Tests: Recent Labs    01/02/20 0231  TSH 4.168   Lipid Profile: No results for input(s): CHOL, HDL, LDLCALC, TRIG, CHOLHDL, LDLDIRECT in the last 72 hours. Anemia Panel: No results for input(s): VITAMINB12, FOLATE, FERRITIN, TIBC, IRON, RETICCTPCT in the last 72 hours. Urine analysis:    Component Value Date/Time   COLORURINE YELLOW 12/30/2019 0430   APPEARANCEUR CLEAR 12/30/2019 0430   LABSPEC 1.036 (H) 12/30/2019 0430   PHURINE 5.0 12/30/2019 0430   GLUCOSEU NEGATIVE 12/30/2019 0430   HGBUR SMALL (A) 12/30/2019 0430   BILIRUBINUR NEGATIVE 12/30/2019 0430   BILIRUBINUR neg 01/12/2013 1714   KETONESUR NEGATIVE 12/30/2019 0430   PROTEINUR 30 (A) 12/30/2019 0430   UROBILINOGEN 0.2 01/12/2013 1714   NITRITE NEGATIVE 12/30/2019 0430   LEUKOCYTESUR NEGATIVE 12/30/2019 0430   Sepsis Labs: Invalid input(s): PROCALCITONIN, [place]  Microbiology: Recent Results (from the past 240 hour(s))  Blood culture (routine x 2)      Status: None (Preliminary result)   Collection Time: 12/29/19 10:21 PM   Specimen: BLOOD  Result Value Ref Range Status   Specimen Description BLOOD SITE NOT SPECIFIED  Final   Special Requests   Final    BOTTLES DRAWN AEROBIC AND ANAEROBIC Blood Culture adequate volume   Culture   Final    NO GROWTH 4 DAYS Performed at [facility], [address] [place], [place] [address]    Report Status PENDING  Incomplete  Respiratory Panel by RT PCR (Flu A&B, Covid) - Nasopharyngeal Swab     Status: None   Collection Time: 12/29/19 10:21 PM   Specimen: Nasopharyngeal Swab  Result Value Ref Range Status   SARS Coronavirus 2 by RT PCR NEGATIVE NEGATIVE Final    Comment: (NOTE) SARS-CoV-2 target nucleic acids are NOT DETECTED. The SARS-CoV-2 RNA is generally detectable in upper respiratoy specimens during the acute phase of infection. The lowest concentration of SARS-CoV-2 viral copies this assay can detect is 131 copies/mL. A negative result does not preclude SARS-Cov-2 infection and should not be used as the sole basis for treatment or other patient management decisions. A negative result may occur with  improper specimen collection/handling, submission of specimen other than nasopharyngeal swab, presence of viral mutation(s) within the areas targeted by this assay, and inadequate number of viral copies (<131 copies/mL). A negative result must be combined with clinical observations, patient history, and epidemiological information. The expected result is Negative. Fact Sheet for Patients:  PinkCheek.be Fact Sheet for Healthcare Providers:  GravelBags.it This test is not yet ap proved or cleared by the [place] FDA and  has been authorized for detection and/or diagnosis of SARS-CoV-2 by FDA under an Emergency Use Authorization (EUA). This EUA will remain  in effect (meaning this test can be used) for the duration of  the COVID-19 declaration under Section 564(b)(1) of the Act, 21 U.S.C. section 360bbb-3(b)(1), unless the authorization is terminated or revoked sooner.    Influenza A by PCR NEGATIVE NEGATIVE Final   Influenza B by PCR NEGATIVE NEGATIVE Final    Comment: (NOTE) The Xpert Xpress SARS-CoV-2/FLU/RSV assay is intended as an aid in  the diagnosis of influenza from Nasopharyngeal swab specimens and  should not be used as a sole basis for treatment. Nasal washings and  aspirates are unacceptable for Xpert Xpress SARS-CoV-2/FLU/RSV  testing. Fact Sheet for Patients: PinkCheek.be Fact Sheet for Healthcare Providers: GravelBags.it This test is not yet approved or cleared by the [place] FDA and  has been authorized for detection and/or diagnosis of SARS-CoV-2 by  FDA under an Emergency Use Authorization (EUA). This EUA will remain  in effect (meaning this test can be used) for the duration of the  Covid-19 declaration under Section 564(b)(1) of the Act, 21  U.S.C. section 360bbb-3(b)(1), unless the authorization is  terminated or revoked. Performed at [facility], [place] [address] [place], [place] [address]     Radiology Studies: CARDIAC CATHETERIZATION  Result Date: 01/02/2020  Normal coronary arteries.  No evidence of left-to-right shunting by oximetry.  Mild elevation in pulmonary artery pressures.  Technically unable to obtain pulmonary capillary wedge pressure.  Normal left ventricular end-diastolic pressure. RECOMMENDATIONS:  Resume anticoagulation therapy for pulmonary embolism as soon as possible.    Mansel Strother T. [place]  If 7PM-7AM, please contact night-coverage [email or web address] Password [facility] 01/02/2020, 5:18 PM

## 2020-01-02 NOTE — Interval H&P Note (Signed)
Cath Lab Visit (complete for each Cath Lab visit)  Clinical Evaluation Leading to the Procedure:   ACS: No.  Non-ACS:    Anginal Classification: CCS II  Anti-ischemic medical therapy: Minimal Therapy (1 class of medications)  Non-Invasive Test Results: No non-invasive testing performed  Prior CABG: No previous CABG      History and Physical Interval Note:  01/02/2020 8:09 AM  Cody Joyce  has presented today for surgery, with the diagnosis of chest pain.  The various methods of treatment have been discussed with the patient and family. After consideration of risks, benefits and other options for treatment, the patient has consented to  Procedure(s): RIGHT/LEFT HEART CATH AND CORONARY ANGIOGRAPHY (N/A) as a surgical intervention.  The patient's history has been reviewed, patient examined, no change in status, stable for surgery.  I have reviewed the patient's chart and labs.  Questions were answered to the patient's satisfaction.     Cody Joyce

## 2020-01-03 ENCOUNTER — Inpatient Hospital Stay (HOSPITAL_COMMUNITY): Payer: No Typology Code available for payment source | Admitting: Anesthesiology

## 2020-01-03 ENCOUNTER — Encounter (HOSPITAL_COMMUNITY)
Admission: EM | Disposition: A | Discharge status: Home / Self Care | Payer: Self-pay | Source: Home / Self Care | Attending: Student

## 2020-01-03 ENCOUNTER — Inpatient Hospital Stay (HOSPITAL_COMMUNITY): Payer: No Typology Code available for payment source

## 2020-01-03 DIAGNOSIS — I483 Typical atrial flutter: Secondary | ICD-10-CM | POA: Diagnosis not present

## 2020-01-03 DIAGNOSIS — Q2112 Patent foramen ovale: Secondary | ICD-10-CM

## 2020-01-03 DIAGNOSIS — Q211 Atrial septal defect: Secondary | ICD-10-CM

## 2020-01-03 DIAGNOSIS — I214 Non-ST elevation (NSTEMI) myocardial infarction: Secondary | ICD-10-CM

## 2020-01-03 DIAGNOSIS — I5021 Acute systolic (congestive) heart failure: Secondary | ICD-10-CM

## 2020-01-03 DIAGNOSIS — I340 Nonrheumatic mitral (valve) insufficiency: Secondary | ICD-10-CM | POA: Diagnosis not present

## 2020-01-03 DIAGNOSIS — I82402 Acute embolism and thrombosis of unspecified deep veins of left lower extremity: Secondary | ICD-10-CM

## 2020-01-03 DIAGNOSIS — N280 Ischemia and infarction of kidney: Secondary | ICD-10-CM | POA: Diagnosis not present

## 2020-01-03 DIAGNOSIS — I2609 Other pulmonary embolism with acute cor pulmonale: Secondary | ICD-10-CM | POA: Diagnosis not present

## 2020-01-03 DIAGNOSIS — I749 Embolism and thrombosis of unspecified artery: Secondary | ICD-10-CM | POA: Diagnosis not present

## 2020-01-03 HISTORY — DX: Atrial septal defect: Q21.1

## 2020-01-03 HISTORY — DX: Non-ST elevation (NSTEMI) myocardial infarction: I21.4

## 2020-01-03 HISTORY — PX: TEE WITHOUT CARDIOVERSION: SHX5443

## 2020-01-03 HISTORY — DX: Patent foramen ovale: Q21.12

## 2020-01-03 HISTORY — DX: Acute systolic (congestive) heart failure: I50.21

## 2020-01-03 HISTORY — PX: BUBBLE STUDY: SHX6837

## 2020-01-03 HISTORY — DX: Acute embolism and thrombosis of unspecified deep veins of left lower extremity: I82.402

## 2020-01-03 LAB — MAGNESIUM: Magnesium: 2.1 mg/dL (ref 1.7–2.4)

## 2020-01-03 LAB — CULTURE, BLOOD (ROUTINE X 2)
Culture: NO GROWTH
Special Requests: ADEQUATE

## 2020-01-03 LAB — COMPREHENSIVE METABOLIC PANEL
ALT: 67 U/L — ABNORMAL HIGH (ref 0–44)
AST: 36 U/L (ref 15–41)
Albumin: 1.5 g/dL — ABNORMAL LOW (ref 3.5–5.0)
Alkaline Phosphatase: 96 U/L (ref 38–126)
Anion gap: 8 (ref 5–15)
BUN: 25 mg/dL — ABNORMAL HIGH (ref 6–20)
CO2: 19 mmol/L — ABNORMAL LOW (ref 22–32)
Calcium: 7.6 mg/dL — ABNORMAL LOW (ref 8.9–10.3)
Chloride: 112 mmol/L — ABNORMAL HIGH (ref 98–111)
Creatinine, Ser: 1.62 mg/dL — ABNORMAL HIGH (ref 0.61–1.24)
GFR calc Af Amer: 53 mL/min — ABNORMAL LOW (ref >60)
GFR calc non Af Amer: 46 mL/min — ABNORMAL LOW (ref >60)
Glucose, Bld: 117 mg/dL — ABNORMAL HIGH (ref 70–99)
Potassium: 4.2 mmol/L (ref 3.5–5.1)
Sodium: 139 mmol/L (ref 135–145)
Total Bilirubin: 0.7 mg/dL (ref 0.3–1.2)
Total Protein: 5.6 g/dL — ABNORMAL LOW (ref 6.5–8.1)

## 2020-01-03 LAB — CBC
HCT: 32.1 % — ABNORMAL LOW (ref 39.0–52.0)
Hemoglobin: 11.1 g/dL — ABNORMAL LOW (ref 13.0–17.0)
MCH: 28.2 pg (ref 26.0–34.0)
MCHC: 34.6 g/dL (ref 30.0–36.0)
MCV: 81.5 fL (ref 80.0–100.0)
Platelets: 156 10*3/uL (ref 150–400)
RBC: 3.94 MIL/uL — ABNORMAL LOW (ref 4.22–5.81)
RDW: 16.1 % — ABNORMAL HIGH (ref 11.5–15.5)
WBC: 14.1 10*3/uL — ABNORMAL HIGH (ref 4.0–10.5)
nRBC: 2.8 % — ABNORMAL HIGH (ref 0.0–0.2)

## 2020-01-03 LAB — PHOSPHORUS: Phosphorus: 2.8 mg/dL (ref 2.5–4.6)

## 2020-01-03 LAB — HEPARIN LEVEL (UNFRACTIONATED): Heparin Unfractionated: 0.4 IU/mL (ref 0.30–0.70)

## 2020-01-03 SURGERY — ECHOCARDIOGRAM, TRANSESOPHAGEAL
Anesthesia: Monitor Anesthesia Care

## 2020-01-03 MED ORDER — PERFLUTREN LIPID MICROSPHERE
INTRAVENOUS | Status: AC
  Filled 2020-01-03: qty 10

## 2020-01-03 MED ORDER — APIXABAN 5 MG PO TABS
10.0000 mg | ORAL_TABLET | Freq: Two times a day (BID) | ORAL | Status: AC
  Administered 2020-01-03 – 2020-01-10 (×14): 10 mg via ORAL
  Filled 2020-01-03 (×15): qty 2

## 2020-01-03 MED ORDER — PHENYLEPH-SHARK LIV OIL-MO-PET 0.25-3-14-71.9 % RE OINT
TOPICAL_OINTMENT | Freq: Two times a day (BID) | RECTAL | Status: DC | PRN
  Filled 2020-01-03: qty 28.4

## 2020-01-03 MED ORDER — AMIODARONE HCL 200 MG PO TABS
400.0000 mg | ORAL_TABLET | Freq: Every day | ORAL | Status: DC
  Administered 2020-01-03 – 2020-01-11 (×9): 400 mg via ORAL
  Filled 2020-01-03 (×9): qty 2

## 2020-01-03 MED ORDER — APIXABAN 5 MG PO TABS
5.0000 mg | ORAL_TABLET | Freq: Two times a day (BID) | ORAL | Status: DC
  Administered 2020-01-10 – 2020-01-11 (×2): 5 mg via ORAL
  Filled 2020-01-03 (×2): qty 1

## 2020-01-03 MED ORDER — PROPOFOL 500 MG/50ML IV EMUL
INTRAVENOUS | Status: DC | PRN
  Administered 2020-01-03: 100 ug/kg/min via INTRAVENOUS

## 2020-01-03 MED ORDER — HYDROCORTISONE (PERIANAL) 2.5 % EX CREA
TOPICAL_CREAM | Freq: Two times a day (BID) | CUTANEOUS | Status: DC | PRN
  Filled 2020-01-03: qty 28.35

## 2020-01-03 MED ORDER — APIXABAN (ELIQUIS) EDUCATION KIT FOR DVT/PE PATIENTS
PACK | Freq: Once | Status: DC
  Filled 2020-01-03: qty 1

## 2020-01-03 MED ORDER — PERFLUTREN LIPID MICROSPHERE
INTRAVENOUS | Status: DC | PRN
  Administered 2020-01-03: 2 mL via INTRAVENOUS

## 2020-01-03 MED ORDER — PROPOFOL 10 MG/ML IV BOLUS
INTRAVENOUS | Status: DC | PRN
  Administered 2020-01-03: 30 mg via INTRAVENOUS
  Administered 2020-01-03 (×2): 10 mg via INTRAVENOUS

## 2020-01-03 NOTE — H&P (View-Only) (Signed)
Progress Note  Patient Name: Cody Joyce Date of Encounter: 01/03/2020  Primary Cardiologist: Sanda Klein, MD   Subjective   No problems overnight. Now on O2 nasal cannula. No further atrial flutter. Cath showed absence of CAD and normal L heart filling pressures (unable to get PAWP, but LVEDP 12-16 mm Hg). As expected R heart pressures c/w acute cor pulmonale (mean RA 15, RVEDP 17, PA 38/12), but preserved CI (2.5 L/min/m sq). No evidence of shunt by oximetry.  Inpatient Medications    Scheduled Meds: . carvedilol  3.125 mg Oral BID WC  . pantoprazole  40 mg Oral Daily  . sodium chloride flush  3 mL Intravenous Q12H   Continuous Infusions: . sodium chloride 50 mL/hr at 01/02/20 1732  . sodium chloride    . amiodarone 30 mg/hr (01/03/20 0015)  . heparin 2,000 Units/hr (01/02/20 2218)   PRN Meds: sodium chloride, acetaminophen, alum & mag hydroxide-simeth, docusate sodium, ipratropium-albuterol, metoprolol tartrate, ondansetron (ZOFRAN) IV, oxyCODONE, polyethylene glycol, sodium chloride flush   Vital Signs    Vitals:   01/02/20 1635 01/02/20 1659 01/02/20 2014 01/03/20 0600  BP: (!) 147/85 112/82 (!) 156/95 127/74  Pulse: 80 82 87   Resp: 14 14  14   Temp:  98.2 F (36.8 C) 99 F (37.2 C) 98.7 F (37.1 C)  TempSrc:  Oral Oral Oral  SpO2: 96% 92% 98% 99%  Weight:      Height:        Intake/Output Summary (Last 24 hours) at 01/03/2020 0812 Last data filed at 01/03/2020 0606 Gross per 24 hour  Intake 720.23 ml  Output 2050 ml  Net -1329.77 ml   Last 3 Weights 01/01/2020 12/30/2019 12/29/2019  Weight (lbs) 177 lb 11.1 oz 174 lb 13.2 oz 185 lb 14.4 oz  Weight (kg) 80.6 kg 79.3 kg 84.324 kg      Telemetry    NSR - Personally Reviewed  ECG    No new tracing - Personally Reviewed  Physical Exam  Comfortable sitting up at 25 deg HOB GEN: No acute distress.   Neck: 7-8 cm JVD Cardiac: RRR, widely split S2, no murmurs, rubs, or gallops.  Respiratory:  Clear to auscultation bilaterally. GI: Soft, nontender, non-distended  MS: No edema; No deformity. Neuro:  Nonfocal  Psych: Normal affect   Labs    High Sensitivity Troponin:   Recent Labs  Lab 12/31/19 1858 12/31/19 2258 01/01/20 0254 01/01/20 0555 01/01/20 0715  TROPONINIHS 3,000* 7,544* 11,131* 11,154* 10,744*      Chemistry Recent Labs  Lab 01/01/20 0254 01/01/20 0254 01/02/20 0231 01/02/20 1401 01/02/20 1409 01/02/20 1416 01/03/20 0458  NA 139   < > 140   < > 145 144 139  K 4.4   < > 4.3   < > 4.3 4.3 4.2  CL 108  --  110  --   --   --  112*  CO2 22  --  11*  --   --   --  19*  GLUCOSE 121*  --  136*  --   --   --  117*  BUN 33*  --  33*  --   --   --  25*  CREATININE 1.73*  --  1.74*  --   --   --  1.62*  CALCIUM 8.0*  --  7.6*  --   --   --  7.6*  PROT 6.2*  --  5.7*  --   --   --  5.6*  ALBUMIN 1.8*  --  1.6*  --   --   --  1.5*  AST 91*  --  59*  --   --   --  36  ALT 122*  --  86*  --   --   --  67*  ALKPHOS 109  --  97  --   --   --  96  BILITOT 1.0  --  0.6  --   --   --  0.7  GFRNONAA 42*  --  42*  --   --   --  46*  GFRAA 49*  --  49*  --   --   --  53*  ANIONGAP 9  --  19*  --   --   --  8   < > = values in this interval not displayed.     Hematology Recent Labs  Lab 01/01/20 0254 01/01/20 0254 01/02/20 0231 01/02/20 1401 01/02/20 1409 01/02/20 1416 01/03/20 0458  WBC 16.8*  --  17.0*  --   --   --  14.1*  RBC 4.54  --  4.10*  --   --   --  3.94*  HGB 13.2   < > 11.8*   < > 12.2* 11.6* 11.1*  HCT 37.1*   < > 33.3*   < > 36.0* 34.0* 32.1*  MCV 81.7  --  81.2  --   --   --  81.5  MCH 29.1  --  28.8  --   --   --  28.2  MCHC 35.6  --  35.4  --   --   --  34.6  RDW 16.4*  --  16.5*  --   --   --  16.1*  PLT 142*  --  155  --   --   --  156   < > = values in this interval not displayed.    BNP Recent Labs  Lab 12/29/19 1633  BNP 1,086.8*     DDimer  Recent Labs  Lab 12/29/19 1633  DDIMER >20.00*     Radiology       Cardiac Studies  R/L HEART CATH 01/03/2020   Normal coronary arteries.  No evidence of left-to-right shunting by oximetry.  Mild elevation in pulmonary artery pressures.  Technically unable to obtain pulmonary capillary wedge pressure.  Normal left ventricular end-diastolic pressure.  RECOMMENDATIONS:   Resume anticoagulation therapy for pulmonary embolism as soon as possible.  Fick Cardiac Output 5.15 L/min  Fick Cardiac Output Index 2.51 (L/min)/BSA  RA A Wave 17 mmHg  RA V Wave 16 mmHg  RA Mean 15 mmHg  RV Systolic Pressure 38 mmHg  RV Diastolic Pressure 12 mmHg  RV EDP 17 mmHg  PA Systolic Pressure 37 mmHg  PA Diastolic Pressure 20 mmHg  PA Mean 26 mmHg  PW A Wave 21 mmHg  PW V Wave 37 mmHg  PW Mean 26 mmHg  AO Systolic Pressure 123456 mmHg  AO Diastolic Pressure 78 mmHg  AO Mean 91 mmHg  LV Systolic Pressure [id number] mmHg  LV Diastolic Pressure 12 mmHg  LV EDP 16 mmHg  AOp Systolic Pressure 123456 mmHg  AOp Diastolic Pressure 79 mmHg  AOp Mean Pressure 91 mmHg  LVp Systolic Pressure 123XX123 mmHg  LVp Diastolic Pressure 13 mmHg  LVp EDP Pressure 16 mmHg  QP/QS 1.03  TPVR Index 10.35 HRUI  TSVR Index 36.23 HRUI  TPVR/TSVR Ratio 0.29   ECHO 04/25 and 04/26  1. Left ventricular ejection fraction, by estimation, is 35 to 40%. The  left ventricle has moderately decreased function. The left ventricle  demonstrates global hypokinesis. Left ventricular diastolic parameters are  consistent with Grade I diastolic  dysfunction (impaired relaxation). There is the interventricular septum is  flattened in systole and diastole, consistent with right ventricular  pressure and volume overload.  2. Right ventricular systolic function is moderately reduced.  3. Right atrial size was moderately dilated.  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 Trivial mitral valve regurgitation.  5. Unable to obtain adequate Doppler signal to estimate pulmonary  pressures. Tricuspid valve  regurgitation is trivial.   1. Agitated saline contrast bubble study was positive with shunting  observed within 5 cardiac cycles suggestive of interatrial shunt.  2. Right ventricular systolic function is mildly reduced. The right  ventricular size is moderately enlarged.  3. Remainder of findings per transthoracic echocardiogram 12/30/19.   Patient Profile     60 y.o. male presenting with submassive pulmonary embolism and synchronous paradoxical embolic renal infarct, L femoral DVT, newly recognized left ventricular systolic dysfunction, markedly elevated cardiac enzymes c/w NSTEMI, normal coronary arteries by angio and suspicion of ASD.  Assessment & Plan    1. PE: seems to have improving oxygenation. Will transition to [place] once no additional invasive procedures are planned (probably later today). 2. AFlutter: change amiodarone to PO. 3. Paradoxical embolism: shunt run at cath did not show significant shunting in either direction. Saline contrast suggested bidirectional shunt, albeit not large volume. This may represent a "matched shunt" due to equalized R/L atrial pressures (less likely). More likely he opened a PFO due to increased RA pressure. For TEE today at 1130h. 4. 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 Serial evaluation with echo may help. For the time being, I am reluctant to add RAAS inhibitors as this may promote right to left shunt. He does not need diuretics.     For questions or updates, please contact [place] Please consult [email or web address] for contact info under        Signed, Sanda Klein, MD  01/03/2020, 8:12 AM

## 2020-01-03 NOTE — Progress Notes (Signed)
PROGRESS NOTE  Cody Joyce [id number] DOB: 02-06-1961   PCP: Patient, No Pcp Per  Patient is from: home  DOA: 12/29/2019 LOS: 5  Brief Narrative / Interim history: 59 y.o. male with history of COPD, chronic alcoholism and tobacco abuse who had a tree branch fall on his right rib cage on 4/15, negative chest x-ray but since been less mobile. Patient presented to the ED on 12/29/2019 from home with complaint of shortness of breath, chest pain.  EMS noted oxygen saturation of 60% brought to the ED on NRB.  CT angio chest showed large bilateral pulmonary emboli with evidence of right heart strain and an adherent mural thrombus along the anterior wall of the RV with bilateral groundglass opacities likely developing pulmonary infarctions, underlying emphysema. CT abdomen pelvis showed multiple left renal cortical infarcts. Patient was admitted to critical care on IV heparin drip. 4/25, IR did Pulmonary angiogram with bilateral pulmonary artery suction thrombectomy of both pulmonary arteries. Patient subsequently improved and was transferred to hospital service on 4/26.  R/LHC on 4/28 with mild pulmonary hypertension but normal coronaries, LVEDP and no evidence of left-to-right shunt by oxymetry.   TEE on 4/29 with LVEF of 30 to 35%, global hypokinesis, interventricular septum flattening, severely reduced RVSF and severely enlarged RV and large PFO with bidirectional shunting across the atrial septum but no thrombus.  Subjective: Seen and examined earlier this morning.  No major events overnight of this morning.  Has no complaints other than some indigestion.  Denies shortness of breath, cough, chest pain, GI or UTI symptoms.  Objective: Vitals:   01/03/20 1241 01/03/20 1250 01/03/20 1300 01/03/20 1302  BP: (!) 84/57 116/76 131/88   Pulse: 77 73 76   Resp: (!) 28 16 19    Temp:      TempSrc:      SpO2: 90% 91% (!) 89% 91%  Weight:      Height:        Intake/Output Summary (Last  24 hours) at 01/03/2020 1539 Last data filed at 01/03/2020 1230 Gross per 24 hour  Intake 400 ml  Output 1650 ml  Net -1250 ml   Filed Weights   01/01/20 0324 01/03/20 1138 01/03/20 1141  Weight: 80.6 kg 83.9 kg 83.9 kg    Examination:  GENERAL: No apparent distress.  Nontoxic. HEENT: MMM.  Vision and hearing grossly intact.  NECK: Supple.  No apparent JVD.  RESP: 99% on 6 L by [place].  No IWOB.  Fair aeration bilaterally. CVS:  RRR. Heart sounds normal.  ABD/GI/GU: Bowel sounds present. Soft. Non tender.  MSK/EXT:  Moves extremities. No apparent deformity.  Trace edema. SKIN: no apparent skin lesion or wound NEURO: Awake, alert and oriented appropriately.  No apparent focal neuro deficit. PSYCH: Calm. Normal affect.  Procedures:  4/25-thrombectomy of both pulmonary arteries by IR. 4/28-R/LHC with normal coronaries and mildly elevated pulmonary artery pressures 4/29-TEE with LVEF of 30 to 35%, global hypokinesis, interventricular septum flattening, severely reduced RVSF and severely enlarged RV, large PFO with bidirectional shunting across the atrial septum but no thrombus    Microbiology summarized: 4/24-COVID-19 PCR negative. 4/24-influenza PCR negative. 4/24-blood cultures negative.  Assessment & Plan: Acute respiratory failure with hypoxia-multifactorial including PE, new onset systolic CHF and possible NSTEMI.  He was on 10 L by [place] this morning.  He was weaned to room air this afternoon.  He is now 92% on room air.  R/LHC and TEE as above. -Encourage incentive spirometry -OOB/PT/OT -Treat treatable causes as  below.  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 -TEE confirms large PFO. -Remains on IV heparin drip.   May be able to switch  to NOAC if no further procedure planned.  NSTEMI: HS Trop> 600 (admit)>>> 11,000>.  R/LHC unrevealing.  Chest pain-free now. -On heparin drip and Coreg.  New onset systolic CHF: Echo on [id number] with EF of 35 to 40%, global hypokinesis, dilated right heart chamber, moderate range reduced RVEF with signs of pressure and volume overload.  BNP> 1000.  Clinically no significant signs of fluid overload.  R/LHC and TEE as above. -Cardiology managing-no indication for diuretics per cardiology  New onset atrial flutter with RVR: Converted to sinus rhythm.  CHA2DS2-VASc score> 3. -Transitioned to p.o. amiodarone.  On low-dose Coreg as well. -Already on heparin   AKI/mild azotemia: baseline Cr normal> 1.96 (admit)>>>1.74> 1.62. CT abdomen found multiple left renal cortical infarcts likely due to paradoxical embolism. -Continue to monitor. -Avoid nephrotoxic meds.  Leukocytosis: Improved.  Likely demargination. -Continue monitoring  Elevated liver enzymes: Likely congestive hepatopathy from PE.  Alcohol could play role.  Resolving. -Continue monitoring  Hyperglycemia/prediabetes: A1c 6.4%. -Counsel on lifestyle change including diet and exercise  COPD? -Continue as needed DuoNeb  Alcohol abuse: No withdrawal symptoms. -Encourage cessation  Tobacco use disorder -Encourage cessation.                   DVT prophylaxis: On heparin drip for PE/a flutter Code Status: Full code Family Communication: Patient and/or RN. Available if any question.  Status is: Inpatient  Remains inpatient appropriate because:Hemodynamically unstable, Ongoing diagnostic testing needed not appropriate for outpatient work up, Inpatient level of care appropriate due to severity of illness and ongoing requirement of high O2 level   Dispo: The patient is from: Home              Anticipated d/c is to: To be determined after therapy evaluation              Anticipated d/c date is: 2 days               Patient currently is not medically stable to d/c.        Consultants:  Cardiology IR PCCM   Sch Meds:  Scheduled Meds: . amiodarone  400 mg Oral Daily  . carvedilol  3.125 mg Oral BID WC  . pantoprazole  40 mg Oral Daily  . sodium chloride flush  3 mL Intravenous Q12H   Continuous Infusions: . sodium chloride 50 mL/hr at 01/02/20 1732  . sodium chloride    . heparin 2,000 Units/hr (01/03/20 1043)   PRN Meds:.sodium chloride, acetaminophen, alum & mag hydroxide-simeth, docusate sodium, ipratropium-albuterol, metoprolol tartrate, ondansetron (ZOFRAN) IV, oxyCODONE, polyethylene glycol, sodium chloride flush  Antimicrobials: Anti-infectives (From admission, onward)   Start     Dose/Rate Route Frequency Ordered Stop   12/29/19 1815  cefTRIAXone (ROCEPHIN) 1 g in sodium chloride 0.9 % 100 mL IVPB  Status:  Discontinued     1 g 200 mL/hr over 30 Minutes Intravenous  Once 12/29/19 1800 12/29/19 1903   12/29/19 1815  azithromycin (ZITHROMAX) 500 mg in sodium chloride 0.9 % 250 mL IVPB  Status:  Discontinued     500 mg 250 mL/hr over 60 Minutes Intravenous  Once 12/29/19 1800 12/29/19 1903  I have personally reviewed the following labs and images: CBC: Recent Labs  Lab 12/29/19 1633 12/29/19 2333 12/30/19 0620 12/30/19 0620 12/31/19 0537 12/31/19 0537 01/01/20 0254 01/01/20 0254 01/02/20 0231 01/02/20 1401 01/02/20 1402 01/02/20 1407 01/02/20 1409 01/02/20 1416 01/03/20 0458  WBC 22.1*  --  20.8*  --  21.7*  --  16.8*  --  17.0*  --   --   --   --   --  14.1*  NEUTROABS 17.5*  --   --   --   --   --   --   --   --   --   --   --   --   --   --   HGB 14.6   < > 14.1   < > 12.9*   < > 13.2   < > 11.8*   < > 11.9* 11.9* 12.2* 11.6* 11.1*  HCT 43.3   < > 41.2   < > 38.1*   < > 37.1*   < > 33.3*   < > 35.0* 35.0* 36.0* 34.0* 32.1*  MCV 84.2  --  82.4  --  83.7  --  81.7  --  81.2  --   --   --   --   --  81.5  PLT 132*  --  120*  --  147*  --  142*  --  155  --    --   --   --   --  156   < > = values in this interval not displayed.   BMP &GFR Recent Labs  Lab 12/30/19 0620 12/30/19 0620 12/31/19 0537 12/31/19 0537 01/01/20 0254 01/01/20 0254 01/02/20 0231 01/02/20 1401 01/02/20 1402 01/02/20 1407 01/02/20 1409 01/02/20 1416 01/03/20 0458  NA 143   < > 141   < > 139   < > 140   < > 145 146* 145 144 139  K 5.5*   < > 4.7   < > 4.4   < > 4.3   < > 4.3 4.2 4.3 4.3 4.2  CL 111  --  111  --  108  --  110  --   --   --   --   --  112*  CO2 21*  --  20*  --  22  --  11*  --   --   --   --   --  19*  GLUCOSE 129*  --  143*  --  121*  --  136*  --   --   --   --   --  117*  BUN 43*  --  49*  --  33*  --  33*  --   --   --   --   --  25*  CREATININE 1.96*  --  1.82*  --  1.73*  --  1.74*  --   --   --   --   --  1.62*  CALCIUM 8.5*  --  7.9*  --  8.0*  --  7.6*  --   --   --   --   --  7.6*  MG 2.6*  --  2.5*  --   --   --   --   --   --   --   --   --  2.1  PHOS 6.1*  --  3.6  --   --   --   --   --   --   --   --   --  2.8   < > = values in this interval not displayed.   Estimated Creatinine Clearance: 53.9 mL/min (A) (by C-G formula based on SCr of 1.62 mg/dL (H)). Liver & Pancreas: Recent Labs  Lab 12/29/19 1633 12/30/19 0620 01/01/20 0254 01/02/20 0231 01/03/20 0458  AST 120* 109* 91* 59* 36  ALT 143* 145* 122* 86* 67*  ALKPHOS 132* 113 109 97 96  BILITOT 1.6* 1.3* 1.0 0.6 0.7  PROT 6.8 6.8 6.2* 5.7* 5.6*  ALBUMIN 2.3* 2.1* 1.8* 1.6* 1.5*   No results for input(s): LIPASE, AMYLASE in the last 168 hours. No results for input(s): AMMONIA in the last 168 hours. Diabetic: No results for input(s): HGBA1C in the last 72 hours. No results for input(s): GLUCAP in the last 168 hours. Cardiac Enzymes: No results for input(s): CKTOTAL, CKMB, CKMBINDEX, TROPONINI in the last 168 hours. No results for input(s): PROBNP in the last 8760 hours. Coagulation Profile: Recent Labs  Lab 12/30/19 1902  INR 1.6*   Thyroid Function  Tests: Recent Labs    01/02/20 0231  TSH 4.168   Lipid Profile: No results for input(s): CHOL, HDL, LDLCALC, TRIG, CHOLHDL, LDLDIRECT in the last 72 hours. Anemia Panel: No results for input(s): VITAMINB12, FOLATE, FERRITIN, TIBC, IRON, RETICCTPCT in the last 72 hours. Urine analysis:    Component Value Date/Time   COLORURINE YELLOW 12/30/2019 0430   APPEARANCEUR CLEAR 12/30/2019 0430   LABSPEC 1.036 (H) 12/30/2019 0430   PHURINE 5.0 12/30/2019 0430   GLUCOSEU NEGATIVE 12/30/2019 0430   HGBUR SMALL (A) 12/30/2019 0430   BILIRUBINUR NEGATIVE 12/30/2019 0430   BILIRUBINUR neg 01/12/2013 1714   KETONESUR NEGATIVE 12/30/2019 0430   PROTEINUR 30 (A) 12/30/2019 0430   UROBILINOGEN 0.2 01/12/2013 1714   NITRITE NEGATIVE 12/30/2019 0430   LEUKOCYTESUR NEGATIVE 12/30/2019 0430   Sepsis Labs: Invalid input(s): PROCALCITONIN, [place]  Microbiology: Recent Results (from the past 240 hour(s))  Blood culture (routine x 2)     Status: None   Collection Time: 12/29/19 10:21 PM   Specimen: BLOOD  Result Value Ref Range Status   Specimen Description BLOOD SITE NOT SPECIFIED  Final   Special Requests   Final    BOTTLES DRAWN AEROBIC AND ANAEROBIC Blood Culture adequate volume   Culture   Final    NO GROWTH 5 DAYS Performed at [facility], [address], [place],  [address]    Report Status 01/03/2020 FINAL  Final  Respiratory Panel by RT PCR (Flu A&B, Covid) - Nasopharyngeal Swab     Status: None   Collection Time: 12/29/19 10:21 PM   Specimen: Nasopharyngeal Swab  Result Value Ref Range Status   SARS Coronavirus 2 by RT PCR NEGATIVE NEGATIVE Final    Comment: (NOTE) SARS-CoV-2 target nucleic acids are NOT DETECTED. The SARS-CoV-2 RNA is generally detectable in upper respiratoy specimens during the acute phase of infection. The lowest concentration of SARS-CoV-2 viral copies this assay can detect is 131 copies/mL. A negative result does not preclude  SARS-Cov-2 infection and should not be used as the sole basis for treatment or other patient management decisions. A negative result may occur with  improper specimen collection/handling, submission of specimen other than nasopharyngeal swab, presence of viral mutation(s) within the areas targeted by this assay, and inadequate number of viral copies (<131 copies/mL). A negative result must be combined with clinical observations, patient history, and epidemiological information. The expected result is Negative. Fact Sheet for Patients:  PinkCheek.be Fact Sheet for Healthcare Providers:  GravelBags.it This test is not yet ap proved or cleared by the [place] and  has been authorized for detection and/or diagnosis of SARS-CoV-2 by FDA under an Emergency Use Authorization (EUA). This EUA will remain  in effect (meaning this test can be used) for the duration of the COVID-19 declaration under Section 564(b)(1) of the Act, 21 U.S.C. section 360bbb-3(b)(1), unless the authorization is terminated or revoked sooner.    Influenza A by PCR NEGATIVE NEGATIVE Final   Influenza B by PCR NEGATIVE NEGATIVE Final    Comment: (NOTE) The Xpert Xpress SARS-CoV-2/FLU/RSV assay is intended as an aid in  the diagnosis of influenza from Nasopharyngeal swab specimens and  should not be used as a sole basis for treatment. Nasal washings and  aspirates are unacceptable for Xpert Xpress SARS-CoV-2/FLU/RSV  testing. Fact Sheet for Patients: PinkCheek.be Fact Sheet for Healthcare Providers: GravelBags.it This test is not yet approved or cleared by the [place] FDA and  has been authorized for detection and/or diagnosis of SARS-CoV-2 by  FDA under an Emergency Use Authorization (EUA). This EUA will remain  in effect (meaning this test can be used) for the duration of the  Covid-19  declaration under Section 564(b)(1) of the Act, 21  U.S.C. section 360bbb-3(b)(1), unless the authorization is  terminated or revoked. Performed at [facility], [place] [address], [place], [place] [address]     Radiology Studies: ECHO TEE  Result Date: 01/03/2020    TRANSESOPHOGEAL ECHO REPORT   Patient Name:   Cody Joyce Date of Exam: 01/03/2020 Medical Rec #:  [id number]       Height:       72.0 in Accession #:    [id number]      Weight:       185.0 lb Date of Birth:  [birth year]        BSA:          2.061 m Patient Age:    44 years        BP:           79/61 mmHg Patient Gender: M               HR:           77 bpm. Exam Location:  Inpatient Procedure: Transesophageal Echo, Cardiac Doppler, Color Doppler, Saline Contrast            Bubble Study and Intracardiac Opacification Agent Indications:     possible atrial septal defect  History:         Patient has prior history of Echocardiogram examinations, most                  recent 12/31/2019. Pulmonary embolus.  Sonographer:     Johny Chess Referring Phys:  [id number] [place] Diagnosing Phys: Ena Dawley MD PROCEDURE: After discussion of the risks and benefits of a TEE, an informed consent was obtained from the patient. The transesophogeal probe was passed without difficulty through the esophogus of the patient. Sedation performed by different physician. The patient was monitored while under deep sedation. Anesthestetic sedation was provided intravenously by Anesthesiology: 172.5mg  of Propofol. The patient's vital signs; including heart rate, blood pressure, and oxygen saturation; remained stable throughout the procedure. The patient developed no complications during the procedure. IMPRESSIONS  1. There is a smoke in the left ventricular apex with no evidence for a thrombus on Definity echo contrast images.. Left ventricular ejection fraction, by estimation, is 30  to 35%. The left ventricle has moderately decreased function. The  left ventricle  demonstrates global hypokinesis. There is the interventricular septum is flattened in systole and diastole, consistent with right ventricular pressure and volume overload.  2. Right ventricular systolic function is severely reduced. The right ventricular size is severely enlarged. There is normal pulmonary artery systolic pressure.  3. Left atrial size was moderately dilated. No left atrial/left atrial appendage thrombus was detected.  4. Right atrial size was moderately dilated.  5. The mitral valve is normal in structure. Mild mitral valve regurgitation. No evidence of mitral stenosis.  6. The aortic valve is normal in structure. Aortic valve regurgitation is not visualized. No aortic stenosis is present.  7. The inferior vena cava is normal in size with greater than 50% respiratory variability, suggesting right atrial pressure of 3 mmHg.  8. Agitated saline contrast bubble study was significantly positive with shunting observed within the first cardiac cycles suggestive of interatrial shunt. There is a large patent foramen ovale with bidirectional shunting across atrial septum. Conclusion(s)/Recommendation(s): Findings are concerning for an interatrial shunt as detailed above. FINDINGS  Left Ventricle: There is a smoke in the left ventricular apex with no evidence for a thrombus on Definity echo contrast images. Left ventricular ejection fraction, by estimation, is 30 to 35%. The left ventricle has moderately decreased function. The left ventricle demonstrates global hypokinesis. Definity contrast agent was given IV to delineate the left ventricular endocardial borders. The left ventricular internal cavity size was normal in size. There is no left ventricular hypertrophy. The interventricular septum is flattened in systole and diastole, consistent with right ventricular pressure and volume overload. Right Ventricle: The right ventricular size is severely enlarged. No increase in right ventricular  wall thickness. Right ventricular systolic function is severely reduced. There is normal pulmonary artery systolic pressure. The tricuspid regurgitant velocity is 2.41 m/s, and with an assumed right atrial pressure of 10 mmHg, the estimated right ventricular systolic pressure is [id number] mmHg. Left Atrium: Left atrial size was moderately dilated. No left atrial/left atrial appendage thrombus was detected. Right Atrium: Right atrial size was moderately dilated. Pericardium: There is no evidence of pericardial effusion. Mitral Valve: The mitral valve is normal in structure. Normal mobility of the mitral valve leaflets. Mild mitral valve regurgitation. No evidence of mitral valve stenosis. Tricuspid Valve: The tricuspid valve is normal in structure. Tricuspid valve regurgitation is mild . No evidence of tricuspid stenosis. Aortic Valve: The aortic valve is normal in structure. Aortic valve regurgitation is not visualized. No aortic stenosis is present. Pulmonic Valve: The pulmonic valve was normal in structure. Pulmonic valve regurgitation is not visualized. No evidence of pulmonic stenosis. Aorta: The aortic root is normal in size and structure. Venous: The inferior vena cava is normal in size with greater than 50% respiratory variability, suggesting right atrial pressure of 3 mmHg. IAS/Shunts: No atrial level shunt detected by color flow Doppler. Agitated saline contrast was given intravenously to evaluate for intracardiac shunting. Agitated saline contrast bubble study was positive with shunting observed within 3-6 cardiac cycles suggestive of interatrial shunt. A large patent foramen ovale is detected with bidirectional shunting across atrial septum. There is a atrial septal defect with predominantly left to right shunting across the atrial septum.  TRICUSPID VALVE TR Peak grad:   23.2 mmHg TR Vmax:        241.00 cm/s Ena Dawley MD Electronically signed by Ena Dawley MD Signature Date/Time: 01/03/2020/2:16:17  PM    Final  Cody Joyce  If 7PM-7AM, please contact night-coverage [email or web address] Password [facility] 01/03/2020, 3:39 PM

## 2020-01-03 NOTE — Progress Notes (Signed)
Transitions of Care Pharmacist Note  Cody Joyce is a 59 y.o. male that has been diagnosed with PE and will be prescribed Eliquis (apixaban) at discharge.   Patient Education: I provided the following education on Eliquis 01/03/20 to the patient: How to take the medication Described what the medication is Signs of bleeding Signs/symptoms of VTE and stroke  Answered their questions  Discharge Medications Plan: The patient wants to have their discharge medications filled by the Transitions of Care pharmacy rather than their usual pharmacy.  The discharge orders pharmacy has been changed to the Transitions of Care pharmacy, the patient will receive a phone call regarding co-pay, and their medications will be delivered by the Transitions of Care pharmacy.    Thank you,   Sherren Kerns, PharmD PGY1 Acute Care Pharmacy Resident January 03, 2020

## 2020-01-03 NOTE — Progress Notes (Signed)
Progress Note  Patient Name: Cody Joyce Date of Encounter: 01/03/2020  Primary Cardiologist: Sanda Klein, MD   Subjective   No problems overnight. Now on O2 nasal cannula. No further atrial flutter. Cath showed absence of CAD and normal L heart filling pressures (unable to get PAWP, but LVEDP 12-16 mm Hg). As expected R heart pressures c/w acute cor pulmonale (mean RA 15, RVEDP 17, PA 38/12), but preserved CI (2.5 L/min/m sq). No evidence of shunt by oximetry.  Inpatient Medications    Scheduled Meds: . carvedilol  3.125 mg Oral BID WC  . pantoprazole  40 mg Oral Daily  . sodium chloride flush  3 mL Intravenous Q12H   Continuous Infusions: . sodium chloride 50 mL/hr at 01/02/20 1732  . sodium chloride    . amiodarone 30 mg/hr (01/03/20 0015)  . heparin 2,000 Units/hr (01/02/20 2218)   PRN Meds: sodium chloride, acetaminophen, alum & mag hydroxide-simeth, docusate sodium, ipratropium-albuterol, metoprolol tartrate, ondansetron (ZOFRAN) IV, oxyCODONE, polyethylene glycol, sodium chloride flush   Vital Signs    Vitals:   01/02/20 1635 01/02/20 1659 01/02/20 2014 01/03/20 0600  BP: (!) 147/85 112/82 (!) 156/95 127/74  Pulse: 80 82 87   Resp: 14 14  14   Temp:  98.2 F (36.8 C) 99 F (37.2 C) 98.7 F (37.1 C)  TempSrc:  Oral Oral Oral  SpO2: 96% 92% 98% 99%  Weight:      Height:        Intake/Output Summary (Last 24 hours) at 01/03/2020 0812 Last data filed at 01/03/2020 0606 Gross per 24 hour  Intake 720.23 ml  Output 2050 ml  Net -1329.77 ml   Last 3 Weights 01/01/2020 12/30/2019 12/29/2019  Weight (lbs) 177 lb 11.1 oz 174 lb 13.2 oz 185 lb 14.4 oz  Weight (kg) 80.6 kg 79.3 kg 84.324 kg      Telemetry    NSR - Personally Reviewed  ECG    No new tracing - Personally Reviewed  Physical Exam  Comfortable sitting up at 25 deg HOB GEN: No acute distress.   Neck: 7-8 cm JVD Cardiac: RRR, widely split S2, no murmurs, rubs, or gallops.  Respiratory:  Clear to auscultation bilaterally. GI: Soft, nontender, non-distended  MS: No edema; No deformity. Neuro:  Nonfocal  Psych: Normal affect   Labs    High Sensitivity Troponin:   Recent Labs  Lab 12/31/19 1858 12/31/19 2258 01/01/20 0254 01/01/20 0555 01/01/20 0715  TROPONINIHS 3,000* 7,544* 11,131* 11,154* 10,744*      Chemistry Recent Labs  Lab 01/01/20 0254 01/01/20 0254 01/02/20 0231 01/02/20 1401 01/02/20 1409 01/02/20 1416 01/03/20 0458  NA 139   < > 140   < > 145 144 139  K 4.4   < > 4.3   < > 4.3 4.3 4.2  CL 108  --  110  --   --   --  112*  CO2 22  --  11*  --   --   --  19*  GLUCOSE 121*  --  136*  --   --   --  117*  BUN 33*  --  33*  --   --   --  25*  CREATININE 1.73*  --  1.74*  --   --   --  1.62*  CALCIUM 8.0*  --  7.6*  --   --   --  7.6*  PROT 6.2*  --  5.7*  --   --   --  5.6*  ALBUMIN 1.8*  --  1.6*  --   --   --  1.5*  AST 91*  --  59*  --   --   --  36  ALT 122*  --  86*  --   --   --  67*  ALKPHOS 109  --  97  --   --   --  96  BILITOT 1.0  --  0.6  --   --   --  0.7  GFRNONAA 42*  --  42*  --   --   --  46*  GFRAA 49*  --  49*  --   --   --  53*  ANIONGAP 9  --  19*  --   --   --  8   < > = values in this interval not displayed.     Hematology Recent Labs  Lab 01/01/20 0254 01/01/20 0254 01/02/20 0231 01/02/20 1401 01/02/20 1409 01/02/20 1416 01/03/20 0458  WBC 16.8*  --  17.0*  --   --   --  14.1*  RBC 4.54  --  4.10*  --   --   --  3.94*  HGB 13.2   < > 11.8*   < > 12.2* 11.6* 11.1*  HCT 37.1*   < > 33.3*   < > 36.0* 34.0* 32.1*  MCV 81.7  --  81.2  --   --   --  81.5  MCH 29.1  --  28.8  --   --   --  28.2  MCHC 35.6  --  35.4  --   --   --  34.6  RDW 16.4*  --  16.5*  --   --   --  16.1*  PLT 142*  --  155  --   --   --  156   < > = values in this interval not displayed.    BNP Recent Labs  Lab 12/29/19 1633  BNP 1,086.8*     DDimer  Recent Labs  Lab 12/29/19 1633  DDIMER >20.00*     Radiology       Cardiac Studies  R/L HEART CATH 01/03/2020   Normal coronary arteries.  No evidence of left-to-right shunting by oximetry.  Mild elevation in pulmonary artery pressures.  Technically unable to obtain pulmonary capillary wedge pressure.  Normal left ventricular end-diastolic pressure.  RECOMMENDATIONS:   Resume anticoagulation therapy for pulmonary embolism as soon as possible.  Fick Cardiac Output 5.15 L/min  Fick Cardiac Output Index 2.51 (L/min)/BSA  RA A Wave 17 mmHg  RA V Wave 16 mmHg  RA Mean 15 mmHg  RV Systolic Pressure 38 mmHg  RV Diastolic Pressure 12 mmHg  RV EDP 17 mmHg  PA Systolic Pressure 37 mmHg  PA Diastolic Pressure 20 mmHg  PA Mean 26 mmHg  PW A Wave 21 mmHg  PW V Wave 37 mmHg  PW Mean 26 mmHg  AO Systolic Pressure 123456 mmHg  AO Diastolic Pressure 78 mmHg  AO Mean 91 mmHg  LV Systolic Pressure [id number] mmHg  LV Diastolic Pressure 12 mmHg  LV EDP 16 mmHg  AOp Systolic Pressure 123456 mmHg  AOp Diastolic Pressure 79 mmHg  AOp Mean Pressure 91 mmHg  LVp Systolic Pressure 123XX123 mmHg  LVp Diastolic Pressure 13 mmHg  LVp EDP Pressure 16 mmHg  QP/QS 1.03  TPVR Index 10.35 HRUI  TSVR Index 36.23 HRUI  TPVR/TSVR Ratio 0.29   ECHO 04/25 and 04/26  1. Left ventricular ejection fraction, by estimation, is 35 to 40%. The  left ventricle has moderately decreased function. The left ventricle  demonstrates global hypokinesis. Left ventricular diastolic parameters are  consistent with Grade I diastolic  dysfunction (impaired relaxation). There is the interventricular septum is  flattened in systole and diastole, consistent with right ventricular  pressure and volume overload.  2. Right ventricular systolic function is moderately reduced.  3. Right atrial size was moderately dilated.  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 Trivial mitral valve regurgitation.  5. Unable to obtain adequate Doppler signal to estimate pulmonary  pressures. Tricuspid valve  regurgitation is trivial.   1. Agitated saline contrast bubble study was positive with shunting  observed within 5 cardiac cycles suggestive of interatrial shunt.  2. Right ventricular systolic function is mildly reduced. The right  ventricular size is moderately enlarged.  3. Remainder of findings per transthoracic echocardiogram 12/30/19.   Patient Profile     59 y.o. male presenting with submassive pulmonary embolism and synchronous paradoxical embolic renal infarct, L femoral DVT, newly recognized left ventricular systolic dysfunction, markedly elevated cardiac enzymes c/w NSTEMI, normal coronary arteries by angio and suspicion of ASD.  Assessment & Plan    1. PE: seems to have improving oxygenation. Will transition to [place] once no additional invasive procedures are planned (probably later today). 2. AFlutter: change amiodarone to PO. 3. Paradoxical embolism: shunt run at cath did not show significant shunting in either direction. Saline contrast suggested bidirectional shunt, albeit not large volume. This may represent a "matched shunt" due to equalized R/L atrial pressures (less likely). More likely he opened a PFO due to increased RA pressure. For TEE today at 1130h. 4. 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 Serial evaluation with echo may help. For the time being, I am reluctant to add RAAS inhibitors as this may promote right to left shunt. He does not need diuretics.     For questions or updates, please contact [place] Please consult [email or web address] for contact info under        Signed, Sanda Klein, MD  01/03/2020, 8:12 AM

## 2020-01-03 NOTE — Interval H&P Note (Signed)
History and Physical Interval Note:  01/03/2020 10:18 AM  Cody Joyce  has presented today for surgery, with the diagnosis of ASD.  The various methods of treatment have been discussed with the patient and family. After consideration of risks, benefits and other options for treatment, the patient has consented to  Procedure(s): TRANSESOPHAGEAL ECHOCARDIOGRAM (TEE) (N/A) as a surgical intervention.  The patient's history has been reviewed, patient examined, no change in status, stable for surgery.  I have reviewed the patient's chart and labs.  Questions were answered to the patient's satisfaction.     Ena Dawley

## 2020-01-03 NOTE — Transfer of Care (Signed)
Immediate Anesthesia Transfer of Care Note  Patient: Cody Joyce  Procedure(s) Performed: TRANSESOPHAGEAL ECHOCARDIOGRAM (TEE)   DEFINITY  (N/A ) BUBBLE STUDY  Patient Location: Endoscopy Unit  Anesthesia Type:MAC  Level of Consciousness: drowsy and patient cooperative  Airway & Oxygen Therapy: Patient Spontanous Breathing and Patient connected to nasal cannula oxygen  Post-op Assessment: Report given to RN, Post -op Vital signs reviewed and stable and Patient moving all extremities  Post vital signs: Reviewed and stable  Last Vitals:  Vitals Value Taken Time  BP 84/57 01/03/20 1241  Temp    Pulse 74 01/03/20 1244  Resp 25 01/03/20 1244  SpO2 89 % 01/03/20 1244  Vitals shown include unvalidated device data.  Last Pain:  Vitals:   01/03/20 1241  TempSrc:   PainSc: 0-No pain         Complications: No apparent anesthesia complications

## 2020-01-03 NOTE — Progress Notes (Signed)
ANTICOAGULATION CONSULT NOTE  Pharmacy Consult for Heparin Indication: pulmonary embolus  No Known Allergies  Patient Measurements: Height: 6\' 1"  (185.4 cm) Weight: 80.6 kg (177 lb 11.1 oz) IBW/kg (Calculated) : 79.9 Heparin Dosing Weight: 79.3 kg  Vital Signs: Temp: 97.7 F (36.5 C) (04/29 0854) Temp Source: Oral (04/29 0600) BP: 127/88 (04/29 0854) Pulse Rate: 79 (04/29 0854)  Labs: Recent Labs    01/01/20 0254 01/01/20 0254 01/01/20 0555 01/01/20 0715 01/02/20 0231 01/02/20 1200 01/02/20 1401 01/02/20 1409 01/02/20 1409 01/02/20 1416 01/03/20 0458  HGB 13.2   < >  --   --  11.8*  --    < > 12.2*   < > 11.6* 11.1*  HCT 37.1*   < >  --   --  33.3*  --    < > 36.0*  --  34.0* 32.1*  PLT 142*  --   --   --  155  --   --   --   --   --  156  HEPARINUNFRC 0.41   < >  --   --  0.18* 0.57  --   --   --   --  0.40  CREATININE 1.73*  --   --   --  1.74*  --   --   --   --   --  1.62*  TROPONINIHS 11,131*  --  11,154* 10,744*  --   --   --   --   --   --   --    < > = values in this interval not displayed.    Estimated Creatinine Clearance: 55.5 mL/min (A) (by C-G formula based on SCr of 1.62 mg/dL (H)).  Assessment: 59 y.o. male with acute PE continuing on heparin. Also noted to have left DVT.  Pharmacy consulted to resume heparin s/p cath 4/28. CBC stable. No active bleed issues reported.   Goal of Therapy:  Heparin level 0.3-0.7 units/ml Monitor platelets by anticoagulation protocol: Yes   Plan:  Continue heparin IV at 2000 units/hr Monitor daily heparin level and CBC, s/sx bleeding F/u further Cardiology plans - may change to DOAC later today per MD note   Arturo Morton, PharmD, BCPS Please check AMION for all [place] contact numbers Clinical Pharmacist 01/03/2020 10:16 AM

## 2020-01-03 NOTE — Anesthesia Procedure Notes (Signed)
Procedure Name: MAC Date/Time: 01/03/2020 12:10 PM Performed by: Moshe Salisbury, CRNA Pre-anesthesia Checklist: Patient identified, Emergency Drugs available, Suction available, Patient being monitored and Timeout performed Patient Re-evaluated:Patient Re-evaluated prior to induction Oxygen Delivery Method: Nasal cannula Placement Confirmation: positive ETCO2 Dental Injury: Teeth and Oropharynx as per pre-operative assessment

## 2020-01-03 NOTE — Anesthesia Preprocedure Evaluation (Signed)
Anesthesia Evaluation  Patient identified by MRN, date of birth, ID band Patient awake    Reviewed: Allergy & Precautions, NPO status , Patient's Chart, lab work & pertinent test results  Airway Mallampati: II  TM Distance: >3 FB Neck ROM: Full    Dental  (+) Dental Advisory Given   Pulmonary COPD, Current Smoker,    Pulmonary exam normal breath sounds clear to auscultation       Cardiovascular negative cardio ROS Normal cardiovascular exam Rhythm:Regular Rate:Normal     Neuro/Psych PSYCHIATRIC DISORDERS negative neurological ROS     GI/Hepatic negative GI ROS, Neg liver ROS,   Endo/Other  negative endocrine ROS  Renal/GU negative Renal ROS     Musculoskeletal  (+) Arthritis ,   Abdominal   Peds  Hematology negative hematology ROS (+)   Anesthesia Other Findings   Reproductive/Obstetrics (+) Pregnancy                             Anesthesia Physical Anesthesia Plan  ASA: III  Anesthesia Plan: MAC   Post-op Pain Management:    Induction: Intravenous  PONV Risk Score and Plan: 0 and Propofol infusion, Treatment may vary due to age or medical condition and TIVA  Airway Management Planned: Natural Airway  Additional Equipment:   Intra-op Plan:   Post-operative Plan:   Informed Consent: I have reviewed the patients History and Physical, chart, labs and discussed the procedure including the risks, benefits and alternatives for the proposed anesthesia with the patient or authorized representative who has indicated his/her understanding and acceptance.     Dental advisory given  Plan Discussed with: CRNA  Anesthesia Plan Comments:         Anesthesia Quick Evaluation

## 2020-01-03 NOTE — Plan of Care (Signed)

## 2020-01-03 NOTE — Progress Notes (Signed)
  Echocardiogram Echocardiogram Transesophageal has been performed.  Cody Joyce 01/03/2020, 12:46 PM

## 2020-01-04 DIAGNOSIS — I700 Atherosclerosis of aorta: Secondary | ICD-10-CM | POA: Diagnosis not present

## 2020-01-04 LAB — COMPREHENSIVE METABOLIC PANEL
ALT: 64 U/L — ABNORMAL HIGH (ref 0–44)
AST: 45 U/L — ABNORMAL HIGH (ref 15–41)
Albumin: 1.5 g/dL — ABNORMAL LOW (ref 3.5–5.0)
Alkaline Phosphatase: 98 U/L (ref 38–126)
Anion gap: 7 (ref 5–15)
BUN: 24 mg/dL — ABNORMAL HIGH (ref 6–20)
CO2: 20 mmol/L — ABNORMAL LOW (ref 22–32)
Calcium: 7.5 mg/dL — ABNORMAL LOW (ref 8.9–10.3)
Chloride: 108 mmol/L (ref 98–111)
Creatinine, Ser: 1.8 mg/dL — ABNORMAL HIGH (ref 0.61–1.24)
GFR calc Af Amer: 47 mL/min — ABNORMAL LOW (ref >60)
GFR calc non Af Amer: 40 mL/min — ABNORMAL LOW (ref >60)
Glucose, Bld: 156 mg/dL — ABNORMAL HIGH (ref 70–99)
Potassium: 4.7 mmol/L (ref 3.5–5.1)
Sodium: 135 mmol/L (ref 135–145)
Total Bilirubin: 0.7 mg/dL (ref 0.3–1.2)
Total Protein: 5.8 g/dL — ABNORMAL LOW (ref 6.5–8.1)

## 2020-01-04 LAB — MAGNESIUM: Magnesium: 1.8 mg/dL (ref 1.7–2.4)

## 2020-01-04 LAB — CBC
HCT: 32.1 % — ABNORMAL LOW (ref 39.0–52.0)
Hemoglobin: 11.2 g/dL — ABNORMAL LOW (ref 13.0–17.0)
MCH: 28.3 pg (ref 26.0–34.0)
MCHC: 34.9 g/dL (ref 30.0–36.0)
MCV: 81.1 fL (ref 80.0–100.0)
Platelets: 206 10*3/uL (ref 150–400)
RBC: 3.96 MIL/uL — ABNORMAL LOW (ref 4.22–5.81)
RDW: 15.9 % — ABNORMAL HIGH (ref 11.5–15.5)
WBC: 14.7 10*3/uL — ABNORMAL HIGH (ref 4.0–10.5)
nRBC: 1.8 % — ABNORMAL HIGH (ref 0.0–0.2)

## 2020-01-04 LAB — GLUCOSE, CAPILLARY: Glucose-Capillary: 160 mg/dL — ABNORMAL HIGH (ref 70–99)

## 2020-01-04 LAB — PHOSPHORUS: Phosphorus: 2.1 mg/dL — ABNORMAL LOW (ref 2.5–4.6)

## 2020-01-04 MED ORDER — APIXABAN (ELIQUIS) VTE STARTER PACK (10MG AND 5MG)
ORAL_TABLET | ORAL | 0 refills | Status: DC
Start: 2020-01-04 — End: 2020-01-09

## 2020-01-04 MED ORDER — AMIODARONE HCL 200 MG PO TABS: ORAL_TABLET | ORAL | 0 refills | Status: DC

## 2020-01-04 MED ORDER — CARVEDILOL 3.125 MG PO TABS: 3.1250 mg | ORAL_TABLET | Freq: Two times a day (BID) | ORAL | 1 refills | Status: DC

## 2020-01-04 MED FILL — AMIODARONE HCL 200 MG TABS: 200 | 30 days supply | Qty: 44 | Fill #0

## 2020-01-04 MED FILL — CARVEDILOL 3.125 MG TABLET: 3.125 | 30 days supply | Qty: 60 | Fill #0

## 2020-01-04 MED FILL — ELIQUIS STARTER PACK 5 MG T: 5 | 30 days supply | Qty: 74 | Fill #0

## 2020-01-04 NOTE — Progress Notes (Signed)
01/04/20 1622  PT Visit Information  Last PT Received On 01/04/20  Assistance Needed +1  PT/OT/SLP Co-Evaluation/Treatment Yes  Reason for Co-Treatment Complexity of the patient's impairments (multi-system involvement);For patient/therapist safety  PT goals addressed during session Mobility/safety with mobility;Balance  History of Present Illness 59 y.o. M with PMH of tobacco use, alcohol use and emphysema who had a tree branch fall on his right rib-cage on 4/15. He had a CXR which showed no fracture, but since then has not been moving much due to pain.  Found to have submassive PE. Pt also states he tore is R gastroc and quad but no evidence of this in chart review or symptoms. Pt also with LLE DVT. Pt is s/p  thrombectomy of both pulmonary arteries by IR on 4/25. Pt is s/p TEE that showed severely enlarged RV and large PFO with bidirectional shunting across the atrial septum but no thrombus.   Precautions  Precautions Other (comment)  Precaution Comments watch SpO2  Restrictions  Weight Bearing Restrictions No  Home Living  Family/patient expects to be discharged to: Private residence  Living Arrangements Spouse/significant other  Available Help at Discharge Family;Available PRN/intermittently  Type of Home House  Home Access Stairs to enter  Entrance Stairs-Number of Steps 1  Home Layout Two level;Able to live on main level with bedroom/bathroom  Alternate Level Stairs-Number of Steps basement downstairs with full flight, but pt does not have to go down there  Water quality scientist None  Prior Function  Level of Independence Independent  Comments fully independent, working as Journalist, 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  Pain Assessment  Pain Assessment No/denies pain  Cognition  Arousal/Alertness Awake/alert  Behavior During Therapy WFL for tasks assessed/performed  Overall Cognitive Status Within Functional  Limits for tasks assessed  Upper Extremity Assessment  Upper Extremity Assessment Defer to OT evaluation  Lower Extremity Assessment  Lower Extremity Assessment Overall WFL for tasks assessed  Cervical / Trunk Assessment  Cervical / Trunk Assessment Normal  Bed Mobility  Overal bed mobility Modified Independent  Transfers  Overall transfer level Modified independent  Ambulation/Gait  Ambulation/Gait assistance Supervision  Gait Distance (Feet) 60 Feet  Assistive device None  Gait Pattern/deviations Step-through pattern;Decreased stride length  General Gait Details Guarded gait. Pt with increased SOB and required standing rest. Oxygen sats dropping to 83% on RA and requiring 3L of O2 to return to 90%. Cues for pursed lip breathing.   Gait velocity Decreased  Balance  Overall balance assessment Mild deficits observed, not formally tested  General Comments  General comments (skin integrity, edema, etc.) Educated about activity pacing at home  PT - End of Session  Equipment Utilized During Treatment Oxygen  Activity Tolerance Treatment limited secondary to medical complications (Comment) (decreased oxygen sats)  Patient left in bed;with call bell/phone within reach;with nursing/sitter in room;with family/visitor present (sitting EOB )  Nurse Communication Mobility status  PT Assessment  PT Recommendation/Assessment Patient needs continued PT services  PT Visit Diagnosis Other abnormalities of gait and mobility (R26.89)  PT Problem List Decreased activity tolerance;Decreased mobility;Cardiopulmonary status limiting activity  PT Plan  PT Frequency (ACUTE ONLY) Min 3X/week  PT Treatment/Interventions (ACUTE ONLY) Gait training;Stair training;Functional mobility training;Balance training;Therapeutic activities;Therapeutic exercise;Patient/family education  AM-PAC PT "6 Clicks" Mobility Outcome Measure (Version 2)  Help needed turning from your back to your side while in a flat bed  without using bedrails? 4  Help needed moving from lying on  your back to sitting on the side of a flat bed without using bedrails? 4  Help needed moving to and from a bed to a chair (including a wheelchair)? 4  Help needed standing up from a chair using your arms (e.g., wheelchair or bedside chair)? 4  Help needed to walk in hospital room? 4  Help needed climbing 3-5 steps with a railing?  3  6 Click Score 23  Consider Recommendation of Discharge To: Home with no services  PT Recommendation  Follow Up Recommendations No PT follow up  PT equipment None recommended by PT  Individuals Consulted  Consulted and Agree with Results and Recommendations Patient;Family member/caregiver  Family Member Consulted wife  Acute Rehab PT Goals  Patient Stated Goal return to independence, have lungs heal  PT Goal Formulation With patient  Time For Goal Achievement 01/18/20  Potential to Achieve Goals Good  PT Time Calculation  PT Start Time (ACUTE ONLY) 1457  PT Stop Time (ACUTE ONLY) 1524  PT Time Calculation (min) (ACUTE ONLY) 27 min  PT General Charges  $$ ACUTE PT VISIT 1 Visit  PT Evaluation  $PT Eval Moderate Complexity 1 Mod   Pt admitted secondary to problem above with deficits above. Pt requiring supervision for gait, however, demonstrated decreased activity tolerance and increased SOB. Required standing rest X1.  Oxygen sats decreasing to 83% on RA and required 3L to return to 90%. Feel pt will progress well and not require follow up PT. Will continue to follow acutely to maximize functional mobility independence and safety.   Reuel Derby, PT, DPT  Acute Rehabilitation Services  Pager: [phone] Office: [phone]

## 2020-01-04 NOTE — Progress Notes (Signed)
SATURATION QUALIFICATIONS: (This note is used to comply with regulatory documentation for home oxygen)  Patient Saturations on Room Air at Rest = 92%  Patient Saturations on Room Air while Ambulating = 82%  Patient Saturations on 3 Liters of oxygen while Ambulating = 93%  Please briefly explain why patient needs home oxygen: Pt quickly desaturates with mobility on RA with increased recovery time.   Zenovia Jarred, MSOT, OTR/L Acute Rehabilitation Services [facility] Office Number: [phone] Pager: [phone]

## 2020-01-04 NOTE — Progress Notes (Signed)
This Probation officer just received a phone call from central telemetry with notification that patient converted to afib rhythm at 1654.  Will notify MD and await further orders.

## 2020-01-04 NOTE — Progress Notes (Signed)
PROGRESS NOTE  Cody Joyce [id number] DOB: 20-Apr-1961   PCP: Patient, No Pcp Per  Patient is from: home  DOA: 12/29/2019 LOS: 6  Brief Narrative / Interim history: 59 y.o. male with history of COPD, chronic alcoholism and tobacco abuse who had a tree branch fall on his right rib cage on 4/15, negative chest x-ray but since been less mobile. Patient presented to the ED on 12/29/2019 from home with complaint of shortness of breath, chest pain.  EMS noted oxygen saturation of 60% brought to the ED on NRB.  CT angio chest showed large bilateral pulmonary emboli with evidence of right heart strain and an adherent mural thrombus along the anterior wall of the RV with bilateral groundglass opacities likely developing pulmonary infarctions, underlying emphysema. CT abdomen pelvis showed multiple left renal cortical infarcts. Patient was admitted to critical care on IV heparin drip.  Patient underwent thrombectomy of both pulmonary arteries by IR on 4/25.Patient subsequently improved and was transferred to hospital service on 4/26.  Patient had brief episode of atrial flutter on 4/27.  He was started on IV amiodarone.   R/LHC on 4/28 with mild pulmonary hypertension but normal coronaries, LVEDP and no evidence of left-to-right shunt by oxymetry.   TEE on 4/29 with LVEF of 30 to 35%, global hypokinesis, interventricular septum flattening, severely reduced RVSF and severely enlarged RV and large PFO with bidirectional shunting across the atrial septum but no thrombus.  Cardiology recommended amiodarone 400 mg daily for 2 weeks followed by 200 mg daily.  He is also on low-dose Coreg.  He was transitioned to p.o. Eliquis for PE/DVT.  Cardiology to arrange outpatient follow-ups.  Patient continues to require 4 L by nasal cannula.  Therapy evaluation pending.  May need home oxygen  Subjective: Seen and examined earlier this morning.  No major events overnight or this morning.  No complaints.   Continues to require 4 L by nasal cannula to maintain appropriate saturation.  Objective: Vitals:   01/03/20 1711 01/03/20 2024 01/04/20 0828 01/04/20 1524  BP: 131/84 (!) 124/91 122/72 123/83  Pulse: 85 92 78 87  Resp: 18  16 20   Temp: 98 F (36.7 C) 99 F (37.2 C) 98.5 F (36.9 C) 99.1 F (37.3 C)  TempSrc: Oral Oral    SpO2: (!) 89% 92% 92% 92%  Weight:      Height:        Intake/Output Summary (Last 24 hours) at 01/04/2020 1536 Last data filed at 01/04/2020 0256 Gross per 24 hour  Intake --  Output 675 ml  Net -675 ml   Filed Weights   01/01/20 0324 01/03/20 1138 01/03/20 1141  Weight: 80.6 kg 83.9 kg 83.9 kg    Examination:  GENERAL: No apparent distress.  Nontoxic. HEENT: MMM.  Vision and hearing grossly intact.  NECK: Supple.  No apparent JVD.  RESP: 93% on 4 L by [place].  No IWOB.  Fair aeration bilaterally. CVS:  RRR. Heart sounds normal.  ABD/GI/GU: Bowel sounds present. Soft. Non tender.  MSK/EXT:  Moves extremities. No apparent deformity. No edema.  SKIN: no apparent skin lesion or wound NEURO: Awake, alert and oriented appropriately.  No apparent focal neuro deficit. PSYCH: Calm. Normal affect.  Procedures:  4/25-thrombectomy of both pulmonary arteries by IR. 4/28-R/LHC with normal coronaries and mildly elevated pulmonary artery pressures 4/29-TEE with LVEF of 30 to 35%, global hypokinesis, interventricular septum flattening, severely reduced RVSF and severely enlarged RV, large PFO with bidirectional shunting across the atrial septum  but no thrombus    Microbiology summarized: 4/24-COVID-19 PCR negative. 4/24-influenza PCR negative. 4/24-blood cultures negative.  Assessment & Plan: Acute respiratory failure with hypoxia-multifactorial including PE, new onset systolic CHF and possible NSTEMI.   R/LHC and TEE as above. Continues to require 4 L to maintain appropriate saturation.  -Wean oxygen as able.  Ambulatory saturation assessment. -Encourage  incentive spirometry -PT/OT evaluation.  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 -TEE confirms large PFO.  Cardiology to arrange outpatient follow-up with the structural heart team -Transitioned to p.o. Eliquis on 4/30.  NSTEMI: HS Trop> 600 (admit)>>> 11,000>.  R/LHC unrevealing.  Chest pain-free now. -On low-dose Coreg.  New onset systolic CHF: Echo on [id number] with EF of 35 to 40%, global hypokinesis, dilated right heart chamber, moderate range reduced RVEF with signs of pressure and volume overload.  BNP> 1000.  Clinically no significant signs of fluid overload.  R/LHC and TEE as above. -No diuretics per cardiology. -Continue low-dose Coreg  New onset atrial flutter with RVR: Converted to sinus rhythm.  CHA2DS2-VASc score> 3. -Transitioned to p.o. amiodarone.  -Plan for discharge on amiodarone 400 mg daily for 2 weeks followed by 200 mg -Also on low-dose Coreg.  On Eliquis for anticoagulation.   AKI/mild azotemia: baseline Cr normal> 1.96 (admit)>>>1.74> 1.62>1.8. CT abdomen found multiple left renal cortical infarcts likely due to paradoxical embolism. -Continue to monitor. -Avoid nephrotoxic meds.  Leukocytosis: Improved.  Likely demargination. -Continue monitoring  Elevated liver enzymes: Likely congestive hepatopathy from PE.  Alcohol could play role.  Resolving. -Continue monitoring  Hyperglycemia/prediabetes: A1c 6.4%. -Counsel on lifestyle change including diet and exercise  COPD? -Continue as needed DuoNeb  Alcohol abuse: No withdrawal symptoms. -Encourage cessation  Tobacco use disorder -Encourage cessation.                   DVT prophylaxis: On heparin drip for PE/a flutter Code Status: Full code Family  Communication: Patient and/or RN. Available if any question.  Status is: Inpatient  Remains inpatient appropriate because:ongoing requirement of high level oxygen and pending therapy evaluation   Dispo: The patient is from: Home              Anticipated d/c is to: To be determined after therapy evaluation              Anticipated d/c date is: 1 day              Patient currently is not medically stable to d/c.        Consultants:  Cardiology IR PCCM   Sch Meds:  Scheduled Meds: . amiodarone  400 mg Oral Daily  . apixaban   Does not apply Once  . apixaban  10 mg Oral BID   Followed by  . [START ON 01/10/2020] apixaban  5 mg Oral BID  . carvedilol  3.125 mg Oral BID WC  . pantoprazole  40 mg Oral Daily  . sodium chloride flush  3 mL Intravenous Q12H   Continuous Infusions: . sodium chloride 50 mL/hr at 01/02/20 1732  . sodium chloride     PRN Meds:.sodium chloride, acetaminophen, alum & mag hydroxide-simeth, docusate sodium, hydrocortisone, ipratropium-albuterol, metoprolol tartrate, ondansetron (ZOFRAN) IV, oxyCODONE, polyethylene glycol, sodium chloride flush  Antimicrobials: Anti-infectives (From admission, onward)   Start     Dose/Rate Route  Frequency Ordered Stop   12/29/19 1815  cefTRIAXone (ROCEPHIN) 1 g in sodium chloride 0.9 % 100 mL IVPB  Status:  Discontinued     1 g 200 mL/hr over 30 Minutes Intravenous  Once 12/29/19 1800 12/29/19 1903   12/29/19 1815  azithromycin (ZITHROMAX) 500 mg in sodium chloride 0.9 % 250 mL IVPB  Status:  Discontinued     500 mg 250 mL/hr over 60 Minutes Intravenous  Once 12/29/19 1800 12/29/19 1903       I have personally reviewed the following labs and images: CBC: Recent Labs  Lab 12/29/19 1633 12/29/19 2333 12/31/19 0537 12/31/19 0537 01/01/20 0254 01/01/20 0254 01/02/20 0231 01/02/20 1401 01/02/20 1407 01/02/20 1409 01/02/20 1416 01/03/20 0458 01/04/20 0321  WBC 22.1*   < > 21.7*  --  16.8*  --  17.0*  --    --   --   --  14.1* 14.7*  NEUTROABS 17.5*  --   --   --   --   --   --   --   --   --   --   --   --   HGB 14.6   < > 12.9*   < > 13.2   < > 11.8*   < > 11.9* 12.2* 11.6* 11.1* 11.2*  HCT 43.3   < > 38.1*   < > 37.1*   < > 33.3*   < > 35.0* 36.0* 34.0* 32.1* 32.1*  MCV 84.2   < > 83.7  --  81.7  --  81.2  --   --   --   --  81.5 81.1  PLT 132*   < > 147*  --  142*  --  155  --   --   --   --  156 206   < > = values in this interval not displayed.   BMP &GFR Recent Labs  Lab 12/30/19 0620 12/30/19 0620 12/31/19 0537 12/31/19 0537 01/01/20 0254 01/01/20 0254 01/02/20 0231 01/02/20 1401 01/02/20 1407 01/02/20 1409 01/02/20 1416 01/03/20 0458 01/04/20 0321  NA 143   < > 141   < > 139   < > 140   < > 146* 145 144 139 135  K 5.5*   < > 4.7   < > 4.4   < > 4.3   < > 4.2 4.3 4.3 4.2 4.7  CL 111   < > 111  --  108  --  110  --   --   --   --  112* 108  CO2 21*   < > 20*  --  22  --  11*  --   --   --   --  19* 20*  GLUCOSE 129*   < > 143*  --  121*  --  136*  --   --   --   --  117* 156*  BUN 43*   < > 49*  --  33*  --  33*  --   --   --   --  25* 24*  CREATININE 1.96*   < > 1.82*  --  1.73*  --  1.74*  --   --   --   --  1.62* 1.80*  CALCIUM 8.5*   < > 7.9*  --  8.0*  --  7.6*  --   --   --   --  7.6* 7.5*  MG 2.6*  --  2.5*  --   --   --   --   --   --   --   --  2.1 1.8  PHOS 6.1*  --  3.6  --   --   --   --   --   --   --   --  2.8 2.1*   < > = values in this interval not displayed.   Estimated Creatinine Clearance: 48.5 mL/min (A) (by C-G formula based on SCr of 1.8 mg/dL (H)). Liver & Pancreas: Recent Labs  Lab 12/30/19 0620 01/01/20 0254 01/02/20 0231 01/03/20 0458 01/04/20 0321  AST 109* 91* 59* 36 45*  ALT 145* 122* 86* 67* 64*  ALKPHOS 113 109 97 96 98  BILITOT 1.3* 1.0 0.6 0.7 0.7  PROT 6.8 6.2* 5.7* 5.6* 5.8*  ALBUMIN 2.1* 1.8* 1.6* 1.5* 1.5*   No results for input(s): LIPASE, AMYLASE in the last 168 hours. No results for input(s): AMMONIA in the last 168  hours. Diabetic: No results for input(s): HGBA1C in the last 72 hours. No results for input(s): GLUCAP in the last 168 hours. Cardiac Enzymes: No results for input(s): CKTOTAL, CKMB, CKMBINDEX, TROPONINI in the last 168 hours. No results for input(s): PROBNP in the last 8760 hours. Coagulation Profile: Recent Labs  Lab 12/30/19 1902  INR 1.6*   Thyroid Function Tests: Recent Labs    01/02/20 0231  TSH 4.168   Lipid Profile: No results for input(s): CHOL, HDL, LDLCALC, TRIG, CHOLHDL, LDLDIRECT in the last 72 hours. Anemia Panel: No results for input(s): VITAMINB12, FOLATE, FERRITIN, TIBC, IRON, RETICCTPCT in the last 72 hours. Urine analysis:    Component Value Date/Time   COLORURINE YELLOW 12/30/2019 0430   APPEARANCEUR CLEAR 12/30/2019 0430   LABSPEC 1.036 (H) 12/30/2019 0430   PHURINE 5.0 12/30/2019 0430   GLUCOSEU NEGATIVE 12/30/2019 0430   HGBUR SMALL (A) 12/30/2019 0430   BILIRUBINUR NEGATIVE 12/30/2019 0430   BILIRUBINUR neg 01/12/2013 1714   KETONESUR NEGATIVE 12/30/2019 0430   PROTEINUR 30 (A) 12/30/2019 0430   UROBILINOGEN 0.2 01/12/2013 1714   NITRITE NEGATIVE 12/30/2019 0430   LEUKOCYTESUR NEGATIVE 12/30/2019 0430   Sepsis Labs: Invalid input(s): PROCALCITONIN, [place]  Microbiology: Recent Results (from the past 240 hour(s))  Blood culture (routine x 2)     Status: None   Collection Time: 12/29/19 10:21 PM   Specimen: BLOOD  Result Value Ref Range Status   Specimen Description BLOOD SITE NOT SPECIFIED  Final   Special Requests   Final    BOTTLES DRAWN AEROBIC AND ANAEROBIC Blood Culture adequate volume   Culture   Final    NO GROWTH 5 DAYS Performed at [facility], [address] [place],  [address]    Report Status 01/03/2020 FINAL  Final  Respiratory Panel by RT PCR (Flu A&B, Covid) - Nasopharyngeal Swab     Status: None   Collection Time: 12/29/19 10:21 PM   Specimen: Nasopharyngeal Swab  Result Value Ref Range Status    SARS Coronavirus 2 by RT PCR NEGATIVE NEGATIVE Final    Comment: (NOTE) SARS-CoV-2 target nucleic acids are NOT DETECTED. The SARS-CoV-2 RNA is generally detectable in upper respiratoy specimens during the acute phase of infection. The lowest concentration of SARS-CoV-2 viral copies this assay can detect is 131 copies/mL. A negative result does not preclude SARS-Cov-2 infection and should not be used as the sole basis for treatment or other patient management decisions. A negative result may occur with  improper specimen collection/handling, submission of specimen other than nasopharyngeal swab, presence of viral mutation(s) within the areas targeted by this assay, and inadequate number of  viral copies (<131 copies/mL). A negative result must be combined with clinical observations, patient history, and epidemiological information. The expected result is Negative. Fact Sheet for Patients:  PinkCheek.be Fact Sheet for Healthcare Providers:  GravelBags.it This test is not yet ap proved or cleared by the [place] FDA and  has been authorized for detection and/or diagnosis of SARS-CoV-2 by FDA under an Emergency Use Authorization (EUA). This EUA will remain  in effect (meaning this test can be used) for the duration of the COVID-19 declaration under Section 564(b)(1) of the Act, 21 U.S.C. section 360bbb-3(b)(1), unless the authorization is terminated or revoked sooner.    Influenza A by PCR NEGATIVE NEGATIVE Final   Influenza B by PCR NEGATIVE NEGATIVE Final    Comment: (NOTE) The Xpert Xpress SARS-CoV-2/FLU/RSV assay is intended as an aid in  the diagnosis of influenza from Nasopharyngeal swab specimens and  should not be used as a sole basis for treatment. Nasal washings and  aspirates are unacceptable for Xpert Xpress SARS-CoV-2/FLU/RSV  testing. Fact Sheet for Patients: PinkCheek.be Fact  Sheet for Healthcare Providers: GravelBags.it This test is not yet approved or cleared by the [place] FDA and  has been authorized for detection and/or diagnosis of SARS-CoV-2 by  FDA under an Emergency Use Authorization (EUA). This EUA will remain  in effect (meaning this test can be used) for the duration of the  Covid-19 declaration under Section 564(b)(1) of the Act, 21  U.S.C. section 360bbb-3(b)(1), unless the authorization is  terminated or revoked. Performed at [facility], [address], [place], [place] [address]     Radiology Studies: No results found.   Romolo Sieling T. [place]  If 7PM-7AM, please contact night-coverage [email or web address] Password [facility] 01/04/2020, 3:36 PM

## 2020-01-04 NOTE — Progress Notes (Addendum)
Progress Note  Patient Name: Cody Joyce Date of Encounter: 01/04/2020  Primary Cardiologist: Cody Klein, MD   Subjective   Patient is on 4L O2. Breathing is slowly improving. No chest pain. Patient is mostly in sinus but has occasional episodes of aflutter.   TEE showed shunting suggestive of interatrail shunt and large PFO with bidirectional shunt across atrial septum.  Inpatient Medications    Scheduled Meds: . amiodarone  400 mg Oral Daily  . apixaban   Does not apply Once  . apixaban  10 mg Oral BID   Followed by  . [START ON 01/10/2020] apixaban  5 mg Oral BID  . carvedilol  3.125 mg Oral BID WC  . pantoprazole  40 mg Oral Daily  . sodium chloride flush  3 mL Intravenous Q12H   Continuous Infusions: . sodium chloride 50 mL/hr at 01/02/20 1732  . sodium chloride     PRN Meds: sodium chloride, acetaminophen, alum & mag hydroxide-simeth, docusate sodium, hydrocortisone, ipratropium-albuterol, metoprolol tartrate, ondansetron (ZOFRAN) IV, oxyCODONE, polyethylene glycol, sodium chloride flush   Vital Signs    Vitals:   01/03/20 1302 01/03/20 1711 01/03/20 2024 01/04/20 0828  BP:  131/84 (!) 124/91 122/72  Pulse:  85 92 78  Resp:  18  16  Temp:  98 F (36.7 C) 99 F (37.2 C) 98.5 F (36.9 C)  TempSrc:  Oral Oral   SpO2: 91% (!) 89% 92% 92%  Weight:      Height:        Intake/Output Summary (Last 24 hours) at 01/04/2020 0902 Last data filed at 01/04/2020 0256 Gross per 24 hour  Intake 400 ml  Output 675 ml  Net -275 ml   Last 3 Weights 01/03/2020 01/03/2020 01/01/2020  Weight (lbs) 185 lb 185 lb 177 lb 11.1 oz  Weight (kg) 83.915 kg 83.915 kg 80.6 kg      Telemetry    NSR, HR 60-70s with occasional aflutter with rates to 110 - Personally Reviewed  ECG    No  new - Personally Reviewed  Physical Exam   GEN: No acute distress.   Neck: + JVD Cardiac: RRR, no murmurs, rubs, or gallops.  Respiratory: Clear to auscultation bilaterally' 4L O2 GI:  Soft, nontender, non-distended  MS: Trace edema; No deformity. Neuro:  Nonfocal  Psych: Normal affect   Labs    High Sensitivity Troponin:   Recent Labs  Lab 12/31/19 1858 12/31/19 2258 01/01/20 0254 01/01/20 0555 01/01/20 0715  TROPONINIHS 3,000* 7,544* 11,131* 11,154* 10,744*      Chemistry Recent Labs  Lab 01/02/20 0231 01/02/20 1401 01/02/20 1416 01/03/20 0458 01/04/20 0321  NA 140   < > 144 139 135  K 4.3   < > 4.3 4.2 4.7  CL 110  --   --  112* 108  CO2 11*  --   --  19* 20*  GLUCOSE 136*  --   --  117* 156*  BUN 33*  --   --  25* 24*  CREATININE 1.74*  --   --  1.62* 1.80*  CALCIUM 7.6*  --   --  7.6* 7.5*  PROT 5.7*  --   --  5.6* 5.8*  ALBUMIN 1.6*  --   --  1.5* 1.5*  AST 59*  --   --  36 45*  ALT 86*  --   --  67* 64*  ALKPHOS 97  --   --  96 98  BILITOT 0.6  --   --  0.7 0.7  GFRNONAA 42*  --   --  46* 40*  GFRAA 49*  --   --  53* 47*  ANIONGAP 19*  --   --  8 7   < > = values in this interval not displayed.     Hematology Recent Labs  Lab 01/02/20 0231 01/02/20 1401 01/02/20 1416 01/03/20 0458 01/04/20 0321  WBC 17.0*  --   --  14.1* 14.7*  RBC 4.10*  --   --  3.94* 3.96*  HGB 11.8*   < > 11.6* 11.1* 11.2*  HCT 33.3*   < > 34.0* 32.1* 32.1*  MCV 81.2  --   --  81.5 81.1  MCH 28.8  --   --  28.2 28.3  MCHC 35.4  --   --  34.6 34.9  RDW 16.5*  --   --  16.1* 15.9*  PLT 155  --   --  156 206   < > = values in this interval not displayed.    BNP Recent Labs  Lab 12/29/19 1633  BNP 1,086.8*     DDimer  Recent Labs  Lab 12/29/19 1633  DDIMER >20.00*     Radiology    CARDIAC CATHETERIZATION  Result Date: 01/02/2020  Normal coronary arteries.  No evidence of left-to-right shunting by oximetry.  Mild elevation in pulmonary artery pressures.  Technically unable to obtain pulmonary capillary wedge pressure.  Normal left ventricular end-diastolic pressure. RECOMMENDATIONS:  Resume anticoagulation therapy for pulmonary embolism  as soon as possible.  ECHO TEE  Result Date: 01/03/2020    TRANSESOPHOGEAL ECHO REPORT   Patient Name:   Cody Joyce Date of Exam: 01/03/2020 Medical Rec #:  [id number]       Height:       72.0 in Accession #:    [id number]      Weight:       185.0 lb Date of Birth:  28-Nov-1960        BSA:          2.061 m Patient Age:    59 years        BP:           79/61 mmHg Patient Gender: M               HR:           77 bpm. Exam Location:  Inpatient Procedure: Transesophageal Echo, Cardiac Doppler, Color Doppler, Saline Contrast            Bubble Study and Intracardiac Opacification Agent Indications:     possible atrial septal defect  History:         Patient has prior history of Echocardiogram examinations, most                  recent 12/31/2019. Pulmonary embolus.  Sonographer:     Cody Joyce Referring Phys:  [id number] [place] Diagnosing Phys: Cody Dawley MD PROCEDURE: After discussion of the risks and benefits of a TEE, an informed consent was obtained from the patient. The transesophogeal probe was passed without difficulty through the esophogus of the patient. Sedation performed by different physician. The patient was monitored while under deep sedation. Anesthestetic sedation was provided intravenously by Anesthesiology: 172.5mg  of Propofol. The patient's vital signs; including heart rate, blood pressure, and oxygen saturation; remained stable throughout the procedure. The patient developed no complications during the procedure. IMPRESSIONS  1. There is a smoke in the left ventricular apex with  no evidence for a thrombus on Definity echo contrast images.. Left ventricular ejection fraction, by estimation, is 30 to 35%. The left ventricle has moderately decreased function. The left ventricle  demonstrates global hypokinesis. There is the interventricular septum is flattened in systole and diastole, consistent with right ventricular pressure and volume overload.  2. Right ventricular systolic  function is severely reduced. The right ventricular size is severely enlarged. There is normal pulmonary artery systolic pressure.  3. Left atrial size was moderately dilated. No left atrial/left atrial appendage thrombus was detected.  4. Right atrial size was moderately dilated.  5. The mitral valve is normal in structure. Mild mitral valve regurgitation. No evidence of mitral stenosis.  6. The aortic valve is normal in structure. Aortic valve regurgitation is not visualized. No aortic stenosis is present.  7. The inferior vena cava is normal in size with greater than 50% respiratory variability, suggesting right atrial pressure of 3 mmHg.  8. Agitated saline contrast bubble study was significantly positive with shunting observed within the first cardiac cycles suggestive of interatrial shunt. There is a large patent foramen ovale with bidirectional shunting across atrial septum. Conclusion(s)/Recommendation(s): Findings are concerning for an interatrial shunt as detailed above. FINDINGS  Left Ventricle: There is a smoke in the left ventricular apex with no evidence for a thrombus on Definity echo contrast images. Left ventricular ejection fraction, by estimation, is 30 to 35%. The left ventricle has moderately decreased function. The left ventricle demonstrates global hypokinesis. Definity contrast agent was given IV to delineate the left ventricular endocardial borders. The left ventricular internal cavity size was normal in size. There is no left ventricular hypertrophy. The interventricular septum is flattened in systole and diastole, consistent with right ventricular pressure and volume overload. Right Ventricle: The right ventricular size is severely enlarged. No increase in right ventricular wall thickness. Right ventricular systolic function is severely reduced. There is normal pulmonary artery systolic pressure. The tricuspid regurgitant velocity is 2.41 m/s, and with an assumed right atrial pressure of  10 mmHg, the estimated right ventricular systolic pressure is [id number] mmHg. Left Atrium: Left atrial size was moderately dilated. No left atrial/left atrial appendage thrombus was detected. Right Atrium: Right atrial size was moderately dilated. Pericardium: There is no evidence of pericardial effusion. Mitral Valve: The mitral valve is normal in structure. Normal mobility of the mitral valve leaflets. Mild mitral valve regurgitation. No evidence of mitral valve stenosis. Tricuspid Valve: The tricuspid valve is normal in structure. Tricuspid valve regurgitation is mild . No evidence of tricuspid stenosis. Aortic Valve: The aortic valve is normal in structure. Aortic valve regurgitation is not visualized. No aortic stenosis is present. Pulmonic Valve: The pulmonic valve was normal in structure. Pulmonic valve regurgitation is not visualized. No evidence of pulmonic stenosis. Aorta: The aortic root is normal in size and structure. Venous: The inferior vena cava is normal in size with greater than 50% respiratory variability, suggesting right atrial pressure of 3 mmHg. IAS/Shunts: No atrial level shunt detected by color flow Doppler. Agitated saline contrast was given intravenously to evaluate for intracardiac shunting. Agitated saline contrast bubble study was positive with shunting observed within 3-6 cardiac cycles suggestive of interatrial shunt. A large patent foramen ovale is detected with bidirectional shunting across atrial septum. There is a atrial septal defect with predominantly left to right shunting across the atrial septum.  TRICUSPID VALVE TR Peak grad:   23.2 mmHg TR Vmax:        241.00 cm/s Cody Dawley MD  Electronically signed by Cody Dawley MD Signature Date/Time: 01/03/2020/2:16:17 PM    Final     Cardiac Studies   R/L HEART CATH 01/03/2020   Normal coronary arteries.  No evidence of left-to-right shunting by oximetry.  Mild elevation in pulmonary artery pressures.  Technically  unable to obtain pulmonary capillary wedge pressure.  Normal left ventricular end-diastolic pressure.  RECOMMENDATIONS:   Resume anticoagulation therapy for pulmonary embolism as soon as possible.  Fick Cardiac Output 5.15 L/min  Fick Cardiac Output Index 2.51 (L/min)/BSA  RA A Wave 17 mmHg  RA V Wave 16 mmHg  RA Mean 15 mmHg  RV Systolic Pressure 38 mmHg  RV Diastolic Pressure 12 mmHg  RV EDP 17 mmHg  PA Systolic Pressure 37 mmHg  PA Diastolic Pressure 20 mmHg  PA Mean 26 mmHg  PW A Wave 21 mmHg  PW V Wave 37 mmHg  PW Mean 26 mmHg  AO Systolic Pressure 123456 mmHg  AO Diastolic Pressure 78 mmHg  AO Mean 91 mmHg  LV Systolic Pressure [id number] mmHg  LV Diastolic Pressure 12 mmHg  LV EDP 16 mmHg  AOp Systolic Pressure 123456 mmHg  AOp Diastolic Pressure 79 mmHg  AOp Mean Pressure 91 mmHg  LVp Systolic Pressure 123XX123 mmHg  LVp Diastolic Pressure 13 mmHg  LVp EDP Pressure 16 mmHg  QP/QS 1.03  TPVR Index 10.35 HRUI  TSVR Index 36.23 HRUI  TPVR/TSVR Ratio 0.29   ECHO 04/25 and 04/26  1. Left ventricular ejection fraction, by estimation, is 35 to 40%. The  left ventricle has moderately decreased function. The left ventricle  demonstrates global hypokinesis. Left ventricular diastolic parameters are  consistent with Grade I diastolic  dysfunction (impaired relaxation). There is the interventricular septum is  flattened in systole and diastole, consistent with right ventricular  pressure and volume overload.  2. Right ventricular systolic function is moderately reduced.  3. Right atrial size was moderately dilated.  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 Trivial mitral valve regurgitation.  5. Unable to obtain adequate Doppler signal to estimate pulmonary  pressures. Tricuspid valve regurgitation is trivial.   1. Agitated saline contrast bubble study was positive with shunting  observed within 5 cardiac cycles suggestive of interatrial shunt.  2. Right ventricular systolic  function is mildly reduced. The right  ventricular size is moderately enlarged.  3. Remainder of findings per transthoracic echocardiogram 12/30/19.    TEE 01/03/20 1. There is a smoke in the left ventricular apex with no evidence for a  thrombus on Definity echo contrast images.. Left ventricular ejection  fraction, by estimation, is 30 to 35%. The left ventricle has moderately  decreased function. The left ventricle  demonstrates global hypokinesis. There is the interventricular septum is  flattened in systole and diastole, consistent with right ventricular  pressure and volume overload.  2. Right ventricular systolic function is severely reduced. The right  ventricular size is severely enlarged. There is normal pulmonary artery  systolic pressure.  3. Left atrial size was moderately dilated. No left atrial/left atrial  appendage thrombus was detected.  4. Right atrial size was moderately dilated.  5. The mitral valve is normal in structure. Mild mitral valve  regurgitation. No evidence of mitral stenosis.  6. The aortic valve is normal in structure. Aortic valve regurgitation is  not visualized. No aortic stenosis is present.  7. The inferior vena cava is normal in size with greater than 50%  respiratory variability, suggesting right atrial pressure of 3 mmHg.  8. Agitated saline  contrast bubble study was significantly positive with  shunting observed within the first cardiac cycles suggestive of  interatrial shunt. There is a large patent foramen ovale with  bidirectional shunting across atrial septum.   Conclusion(s)/Recommendation(s): Findings are concerning for an  interatrial shunt as detailed above.   Patient Profile     59 y.o. male with submassive pulmonary embolism and synchronous paradoxical embolic renal infarct, L femoral DVT, newly recognized left ventricular systolic dysfunction, markedly elevated cardiac enzymes c/w NSTEMI, normal coronary arteries by  angio and suspicion of ASD.  Assessment & Plan    PE - now on 4L O2 - Started on Eliquis for DVT/PE  Aflutter - amiodarone changed to PO - Will have occasional episodes of alflutter, but mostly in sinus  Paradoxical embolism/PFO - No significant shunt on cath, however saline contrast did suggest bidirectional shunt - TEE showed shunting suggestive of interatrail shunt and large PFO with bidirectional shunt across atrial septum - Plan to follow up with Structural heart team as outpatient  LV dysfunction - EF 35-40% with global hypokinesis, G1DD - Hs trop peaked at 11,000 >> LHC showed normal coronaries. No chest pain - possible Takotsubos or nonsichemic CM - plan for serial echos - patient is not fluid overloaded   For questions or updates, please contact [place] Please consult [email or web address] for contact info under        Signed, Cadence Ninfa Meeker, PA-C  01/04/2020, 9:02 AM    I have seen and examined the patient along with Cadence Ninfa Meeker, PA-C .  I have reviewed the chart, notes and new data.  I agree with PA/NP's note.  Key new complaints: feeling better, dyspnea is slowly improving. Unaware of palpitations. Key examination changes:  Mild JVD 8 cm. O2 sat 92% on 4L O2. Key new findings / data: Had about 5 x 30 minute episodes of atrial flutter with 2:1 AV block, 110-120 bpm, during the night, but has been in NSR for the last 6 hours or so.  PLAN: At this point CV workup is complete. Suspect PE simply unmasked a preexisting large PFO/secundum ASD with bidirectional shunt (there is septal tissue overlap rather than a persistent orifice, but there is continuous shunting), by causing marked increase in R to L shunt with severe hypoxia and paradoxical embolism. What remains unclear is whether the L heart systolic dysfunction represents a previously silent nonischemic cardiomyopathy or is a transient problem (takotsubo sd? Paradoxical coronary embolism with  stunning?). Reluctant to start RAAS inhibitors since lowering the SVR would possibly worsen the R to L shunt and hypoxemia. He does not need diuretics. Had normal L heart filling pressures at cath. Low dose beta blocker started. On amiodarone for paroxysmal atrial flutter (need to reduce to 200 mg daily in 2 weeks). On DOAC, PE dosing protocol. Recommend follow up echo in 1 months to reassess LV function. If still depressed, needs to start ACEi/ARB/ARNI. Also f/u in one month with Dr. [name] to discuss PFO device closure.  Cody Klein, MD, [place] [phone] 01/04/2020, 12:22 PM

## 2020-01-04 NOTE — Progress Notes (Signed)
SATURATION QUALIFICATIONS: (This note is used to comply with regulatory documentation for home oxygen)  Patient Saturations on Room Air at Rest = 90%  Patient Saturations on Room Air while Ambulating = 83%  Patient Saturations on 3 Liters of oxygen while Ambulating = 90%  Please briefly explain why patient needs home oxygen: Pt desaturating to 83% on RA during functional mobility. Required supplemental oxygen to maintain adequate saturations.   Reuel Derby, PT, DPT  Acute Rehabilitation Services  Pager: [phone] Office: [phone]

## 2020-01-04 NOTE — Anesthesia Postprocedure Evaluation (Signed)
Anesthesia Post Note  Patient: Cody Joyce  Procedure(s) Performed: TRANSESOPHAGEAL ECHOCARDIOGRAM (TEE)   DEFINITY  (N/A ) BUBBLE STUDY     Patient location during evaluation: PACU Anesthesia Type: MAC Level of consciousness: awake and alert Pain management: pain level controlled Vital Signs Assessment: post-procedure vital signs reviewed and stable Respiratory status: spontaneous breathing Cardiovascular status: stable Anesthetic complications: no    Last Vitals:  Vitals:   01/04/20 0828 01/04/20 1524  BP: 122/72 123/83  Pulse: 78 87  Resp: 16 20  Temp: 36.9 C 37.3 C  SpO2: 92% 92%    Last Pain:  Vitals:   01/04/20 0800  TempSrc:   PainSc: 0-No pain                 Nolon Nations

## 2020-01-04 NOTE — Evaluation (Signed)
Occupational Therapy Evaluation Patient Details Name: Cody Joyce MRN: [id number] DOB: 09/09/1960 Today's Date: 01/04/2020    History of Present Illness 59 y.o. M with PMH of tobacco use, alcohol use and emphysema who had a tree branch fall on his right rib-cage on 4/15. He had a CXR which showed no fracture, but since then has not been moving much due to pain.  Found to have submassive PE. Pt also states he tore is R gastroc and quad but no evidence of this in chart review or symptoms   Clinical Impression   PTA pt living with spouse, independent and working as Animator. At time of eval, pt demonstrates ability to complete BADL at mod I level. Pt has poor activity tolerance and cardiopulmonary deficits limiting ability to complete BADL at desired level of independence. Pt completed functional mobility a household distance on RA and desaturated to 83% SpO2. Cued pt to stop with rest break and pursed lip breathing, still not recovering. Pt ultimately needing 3L [place] to maintain SpO2 and increased recovery time. Extensive education given on ECS strategies. Pt was oriented to SpO2 norms, self monitoring, pursed lip breathing, pacing, and when to implement rest breaks/seated BADL. Wife has pulse ox, had pt practice measuring own O2 sats as well as wife. No post acute O2 follow up is indicated, but OT will continue to follow to progress BADL tolerance while acute.    Follow Up Recommendations  No OT follow up;Supervision - Intermittent    Equipment Recommendations  Tub/shower seat    Recommendations for Other Services       Precautions / Restrictions Precautions Precautions: None Precaution Comments: watch SpO2 Restrictions Weight Bearing Restrictions: No      Mobility Bed Mobility Overal bed mobility: Modified Independent                Transfers Overall transfer level: Modified independent                    Balance Overall balance assessment: Mild deficits observed,  not formally tested                                         ADL either performed or assessed with clinical judgement   ADL Overall ADL's : Modified independent                                       General ADL Comments: Pt is able to complete BADL physically at mod I level, but demonstrates poor activity tolerance. Pt continues to need education and cueing for ECS throughout BADL activity. Pt demonstrated ability to complete toilet transfer, bed mobility, and functional mobility without external assist.     Vision Patient Visual Report: No change from baseline       Perception     Praxis      Pertinent Vitals/Pain Pain Assessment: No/denies pain     Hand Dominance     Extremity/Trunk Assessment Upper Extremity Assessment Upper Extremity Assessment: Overall WFL for tasks assessed   Lower Extremity Assessment Lower Extremity Assessment: Defer to PT evaluation       Communication Communication Communication: No difficulties   Cognition Arousal/Alertness: Awake/alert Behavior During Therapy: WFL for tasks assessed/performed Overall Cognitive Status: Within Functional Limits for tasks assessed  General Comments       Exercises     Shoulder Instructions      Home Living Family/patient expects to be discharged to:: Private residence Living Arrangements: Spouse/significant other Available Help at Discharge: Family;Available PRN/intermittently Type of Home: House Home Access: Stairs to enter Entrance Stairs-Number of Steps: 1   Home Layout: Two level;Able to live on main level with bedroom/bathroom Alternate Level Stairs-Number of Steps: basement downstairs with full flight, but pt does not have to go down there   Bathroom Shower/Tub: Teacher, early years/pre: Standard     Home Equipment: None          Prior Functioning/Environment Level of Independence:  Independent        Comments: fully independent, working as Pension scheme manager Problem List: Decreased knowledge of use of DME or AE;Decreased knowledge of precautions;Decreased activity tolerance;Cardiopulmonary status limiting activity      OT Treatment/Interventions: Self-care/ADL training;Therapeutic exercise;Patient/family education;Balance training;Therapeutic activities;Energy conservation;DME and/or AE instruction    OT Goals(Current goals can be found in the care plan section) Acute Rehab OT Goals Patient Stated Goal: return to independence, have lungs heal OT Goal Formulation: With patient Time For Goal Achievement: 01/18/20 Potential to Achieve Goals: Good  OT Frequency: Min 2X/week   Barriers to D/C:            Co-evaluation PT/OT/SLP Co-Evaluation/Treatment: Yes Reason for Co-Treatment: For patient/therapist safety;To address functional/ADL transfers   OT goals addressed during session: ADL's and self-care      AM-PAC OT "6 Clicks" Daily Activity     Outcome Measure Help from another person eating meals?: None Help from another person taking care of personal grooming?: None Help from another person toileting, which includes using toliet, bedpan, or urinal?: None Help from another person bathing (including washing, rinsing, drying)?: None Help from another person to put on and taking off regular upper body clothing?: None Help from another person to put on and taking off regular lower body clothing?: None 6 Click Score: 24   End of Session Equipment Utilized During Treatment: Oxygen Nurse Communication: Mobility status  Activity Tolerance: Patient tolerated treatment well Patient left: in bed;with family/visitor present  OT Visit Diagnosis: Other abnormalities of gait and mobility (R26.89)                Time: [id number] OT Time Calculation (min): 28 min Charges:  OT General Charges $OT Visit: 1 Visit OT Evaluation $OT Eval Moderate Complexity: [place], MSOT, OTR/L [facility] Office Number: [phone] Pager: [phone]  Zenovia Jarred 01/04/2020, 3:51 PM

## 2020-01-05 LAB — RENAL FUNCTION PANEL
Albumin: 1.5 g/dL — ABNORMAL LOW (ref 3.5–5.0)
Anion gap: 10 (ref 5–15)
BUN: 22 mg/dL — ABNORMAL HIGH (ref 6–20)
CO2: 19 mmol/L — ABNORMAL LOW (ref 22–32)
Calcium: 7.7 mg/dL — ABNORMAL LOW (ref 8.9–10.3)
Chloride: 106 mmol/L (ref 98–111)
Creatinine, Ser: 1.66 mg/dL — ABNORMAL HIGH (ref 0.61–1.24)
GFR calc Af Amer: 52 mL/min — ABNORMAL LOW (ref >60)
GFR calc non Af Amer: 44 mL/min — ABNORMAL LOW (ref >60)
Glucose, Bld: 131 mg/dL — ABNORMAL HIGH (ref 70–99)
Phosphorus: 2.9 mg/dL (ref 2.5–4.6)
Potassium: 4.5 mmol/L (ref 3.5–5.1)
Sodium: 135 mmol/L (ref 135–145)

## 2020-01-05 LAB — CBC
HCT: 31.1 % — ABNORMAL LOW (ref 39.0–52.0)
Hemoglobin: 10.9 g/dL — ABNORMAL LOW (ref 13.0–17.0)
MCH: 27.9 pg (ref 26.0–34.0)
MCHC: 35 g/dL (ref 30.0–36.0)
MCV: 79.5 fL — ABNORMAL LOW (ref 80.0–100.0)
Platelets: 246 10*3/uL (ref 150–400)
RBC: 3.91 MIL/uL — ABNORMAL LOW (ref 4.22–5.81)
RDW: 15.8 % — ABNORMAL HIGH (ref 11.5–15.5)
WBC: 13.9 10*3/uL — ABNORMAL HIGH (ref 4.0–10.5)
nRBC: 0.7 % — ABNORMAL HIGH (ref 0.0–0.2)

## 2020-01-05 LAB — MAGNESIUM: Magnesium: 1.8 mg/dL (ref 1.7–2.4)

## 2020-01-05 NOTE — Discharge Summary (Signed)
Physician Discharge Summary  Cody Joyce [id number] DOB: Mar 28, 1956 DOA: 12/29/2019  PCP: Patient, No Pcp Per  Admit date: 12/29/2019 Discharge date: 01/05/2020  Admitted From: Home Disposition: Home Recommendations for Outpatient Follow-up:  1. Follow up with PCP in 1-2 weeks 2. Please obtain BMP/CBC in one week 3. Please follow up Dr. Burt Knack with cardiology  Home Health: Yes Equipment/Devices oxygen  Discharge Condition: Stable and improved CODE STATUS full code Diet recommendation cardiac diet  Brief/Interim Summary:59 y.o.malewith history of COPD, chronic alcoholism and tobacco abuse who had a tree branch fall on his right rib cage on 4/15, negative chest x-ray but since been less mobile. Patient presented to the ED on4/24/2021from home with complaint of shortness of breath, chest pain. EMS noted oxygen saturation of 60% brought to the ED on NRB.  CT angio chest showed large bilateral pulmonary emboli with evidence of right heart strain and an adherent mural thrombus along the anterior wall of the RV with bilateral groundglass opacities likely developing pulmonary infarctions, underlying emphysema. CT abdomen pelvis showed multiple left renal cortical infarcts. Patient was admitted to critical care on IV heparin drip.  Patient underwent thrombectomy of both pulmonary arteries by IR on 4/25.Patient subsequently improved and was transferred to hospital service on 4/26.  Patient had brief episode of atrial flutter on 4/27.  He was started on IV amiodarone.   R/LHC on 4/28 with mild pulmonary hypertension but normal coronaries, LVEDP and no evidence of left-to-right shunt by oxymetry.   TEE on 4/29 with LVEF of 30 to 35%, global hypokinesis, interventricular septum flattening, severely reduced RVSF and severely enlarged RV and large PFO with bidirectional shunting across the atrial septum but no thrombus.  Cardiology recommended amiodarone 400 mg daily for 2 weeks  followed by 200 mg daily.  He is also on low-dose Coreg.  He was transitioned to p.o. Eliquis for PE/DVT.  Cardiology to arrange outpatient follow-ups.  Patient saturation on room air at rest was 92%  Patient saturation on room air while ambulating was 82% patient saturation on 3 L while ambulating 93%.    discharge Diagnoses:  Principal Problem:   Aortic atherosclerosis (HCC) Active Problems:   COPD (chronic obstructive pulmonary disease) (HCC)   Alcohol dependence ([place])   Pulmonary embolism (HCC)   Renal infarct ([place])   Acute pulmonary embolism with acute cor pulmonale (HCC)   PFO (patent foramen ovale)   Acute systolic CHF (congestive heart failure) (HCC)   Non-STEMI (non-ST elevated myocardial infarction) (HCC)   Acute deep vein thrombosis (DVT) of left lower extremity (HCC)  Acute respiratory failure with hypoxia-multifactorial including PE, new onset systolic CHF and possible NSTEMI.   R/LHC and TEE as above.  Discharged on 3 L of oxygen since his sats dropped to 82% on room air with ambulation.  Seen by physical therapy will discharge patient with home PT and oxygen.  Submassive PE with acute cor pulmonale/LLE common femoral and popliteal DVT -likely provoked  Multiple left renal cortical infarcts likely paradoxical embolism-TEE revealed large PFO -Bilateral pulmonary artery suction thrombectomy of both pulmonary arteries by IR on 4/25. -TEE confirms large PFO.  Follow-up with Dr. Burt Knack cardiology. -Transitioned to p.o. Eliquis on 4/30.  NSTEMI: HS Trop> 600 (admit)>>> 11,000>.  R/LHC unrevealing.  -On low-dose Coreg.  New onset systolic CHF: Echo on [id number] with EF of 35 to 40%, global hypokinesis, dilated right heart chamber, moderate range reduced RVEF with signs of pressure and volume overload.  BNP> 1000.  Clinically no significant  signs of fluid overload.  R/LHC and TEE as above. -No diuretics per cardiology. -Continue low-dose Coreg  New onset atrial flutter with  RVR: Converted to sinus rhythm.  CHA2DS2-VASc score> 3. -Transitioned to p.o. amiodarone.  -Plan for discharge on amiodarone 400 mg daily for 2 weeks followed by 200 mg -Also on low-dose Coreg.  On Eliquis for anticoagulation.  AKI/mild azotemia: baseline Cr normal> 1.96 (admit)>>>1.74> 1.62>1.8. CT abdomen found multiple left renal cortical infarcts likely due to paradoxical embolism.  Leukocytosis: Improved.  Likely demargination.  Elevated liver enzymes: Likely congestive hepatopathy from PE.  Alcohol could play role.    Hyperglycemia/prediabetes: A1c 6.4%. -Counsel on lifestyle change including diet and exercise  COPD? -Continue as needed DuoNeb  Alcohol abuse: No withdrawal symptoms. -Encourage cessation  Tobacco use disorder -Encourage cessation.   Estimated body mass index is 25.33 kg/m as calculated from the following:   Height as of this encounter: 6' (1.829 m).   Weight as of this encounter: 84.7 kg.  Discharge Instructions  Discharge Instructions    Diet - low sodium heart healthy   Complete by: As directed    Increase activity slowly   Complete by: As directed      Allergies as of 01/05/2020   No Known Allergies     Medication List    STOP taking these medications   Blue-Emu Super Strength Crea     TAKE these medications   acetaminophen 500 MG tablet Commonly known as: TYLENOL Take 1,000 mg by mouth every 6 (six) hours as needed for headache (pain).   albuterol 108 (90 Base) MCG/ACT inhaler Commonly known as: VENTOLIN HFA Inhale 2 puffs into the lungs 4 (four) times daily.   amiodarone 200 MG tablet Commonly known as: PACERONE Take 2 tablets (400 mg total) by mouth daily for 14 days, THEN 1 tablet (200 mg total) daily. Start taking on: Jan 05, 2020   Apixaban Starter Pack (10mg  and 5mg ) Commonly known as: ELIQUIS STARTER PACK Take as directed on package: start with two-5mg  tablets twice daily for 7 days. On day 8, switch to one-5mg   tablet twice daily.   carvedilol 3.125 MG tablet Commonly known as: COREG Take 1 tablet (3.125 mg total) by mouth 2 (two) times daily with a meal.   LIDOCAINE EX Apply 1 application topically daily as needed (pain).   multivitamin with minerals Tabs tablet Take 1 tablet by mouth daily.   omeprazole 20 MG capsule Commonly known as: PRILOSEC Take 20 mg by mouth 2 (two) times daily before a meal.   Voltaren 1 % Gel Generic drug: diclofenac Sodium Apply 1 application topically 3 (three) times daily as needed (knee pain).      Follow-up Information    Croitoru, Mihai, MD Follow up.   Specialty: Cardiology Contact information: [address] [place] [address] [phone]        [name], MD Follow up.   Specialty: Cardiology Contact information: [id number] [place] [address] [phone]          No Known Allergies  Consultations:  Interventional radiology, cardiology   Procedures/Studies: CT Angio Chest PE W and/or Wo Contrast  Result Date: 12/29/2019 CLINICAL DATA:  Short of breath, right-sided chest and rib pain, recent history of trauma EXAM: CT ANGIOGRAPHY CHEST WITH CONTRAST TECHNIQUE: Multidetector CT imaging of the chest was performed using the standard protocol during bolus administration of intravenous contrast. Multiplanar CT image reconstructions and MIPs were obtained to evaluate the  vascular anatomy. CONTRAST:  171mL OMNIPAQUE IOHEXOL 350 MG/ML SOLN COMPARISON:  None. FINDINGS: Cardiovascular: This is a technically adequate evaluation of the pulmonary vasculature. There are large bilateral pulmonary emboli filling the main pulmonary arteries. There is straightening of the interventricular septum and mild dilation of the right ventricle, with RV/LV ratio measuring 1.3. Along the nondependent surface of the right ventral, actually better visualized on the corresponding abdominal CT, there is likely a here  mural thrombus. Echocardiography may be useful for further evaluation. No pericardial effusion. The thoracic aorta is normal in caliber without aneurysm or dissection. Mediastinum/Nodes: No enlarged mediastinal, hilar, or axillary lymph nodes. Thyroid gland, trachea, and esophagus demonstrate no significant findings. Lungs/Pleura: There is background emphysema. Bilateral areas of airspace disease are seen within the dependent upper lobes and superior segment of the bilateral lower lobes. Ground-glass opacities are seen within the dependent lower lobes. No effusion or pneumothorax. Central airways are patent. Upper Abdomen: No acute abnormality. Musculoskeletal: No acute or destructive bony lesions. Reconstructed images demonstrate no additional findings. Review of the MIP images confirms the above findings. IMPRESSION: 1. Large bilateral pulmonary emboli with CT evidence of right heart strain (RV/LV Ratio = 1.3) consistent with at least submassive (intermediate risk) PE. The presence of right heart strain has been associated with an increased risk of morbidity and mortality. 2. Likely adherent mural thrombus along the anterior wall the right ventricle, better visualized on the corresponding abdominal CT. 3. Bilateral areas of consolidation and ground-glass airspace disease, superimposed upon background emphysema. Findings favor pulmonary edema with developing pulmonary infarctions. 4.  Emphysema (ICD10-J43.9). These results were called by telephone at the time of interpretation on 12/29/2019 at 7:07 pm to provider Dr. Melina Copa, who verbally acknowledged these results. Electronically Signed   By: Randa Ngo M.D.   On: 12/29/2019 19:08   CT ABDOMEN PELVIS W CONTRAST  Result Date: 12/29/2019 CLINICAL DATA:  History of abdominal trauma, shortness of breath, right upper quadrant pain EXAM: CT ABDOMEN AND PELVIS WITH CONTRAST TECHNIQUE: Multidetector CT imaging of the abdomen and pelvis was performed using the  standard protocol following bolus administration of intravenous contrast. CONTRAST:  165mL OMNIPAQUE IOHEXOL 350 MG/ML SOLN COMPARISON:  None. FINDINGS: Lower chest: Large bilateral pulmonary emboli are seen. Diffuse ground-glass opacities throughout the lung bases. Please refer to corresponding CT chest report for important discussion of these findings. Hepatobiliary: No hepatic injury or perihepatic hematoma. Gallbladder is unremarkable Pancreas: Unremarkable. No pancreatic ductal dilatation or surrounding inflammatory changes. Spleen: No splenic injury or perisplenic hematoma. Adrenals/Urinary Tract: There are multiple wedge-shaped hypodensities within the left kidney consistent with renal infarcts. There is no surrounding fluid or perinephric fat stranding to suggest posttraumatic etiology. The right kidney enhances normally. The adrenals are unremarkable. Bladder is grossly normal. Stomach/Bowel: No bowel obstruction or ileus. Normal appendix right lower quadrant. No bowel wall thickening or inflammatory change. Vascular/Lymphatic: Minimal atherosclerosis of the abdominal aorta. The visualized vascular structures opacify normally. No pathologic adenopathy. Reproductive: Prostate is unremarkable. Other: No free fluid or free gas. No abdominal wall hernia. No evidence of soft tissue injury related to recent trauma. Musculoskeletal: No acute or destructive bony lesions. Reconstructed images demonstrate no additional findings. IMPRESSION: 1. Multiple left renal cortical infarcts. 2. Large bilateral pulmonary emboli. Please refer to corresponding CT chest report for important discussion of these findings. 3. No evidence of intra-abdominal or intrapelvic trauma. 4. Aortic Atherosclerosis (ICD10-I70.0). These results were called by telephone at the time of interpretation on 12/29/2019 at 7:03 pm to  provider Dr. Melina Copa, who verbally acknowledged these results. Electronically Signed   By: Randa Ngo M.D.   On:  12/29/2019 19:08   IR Angiogram Pulmonary Bilateral Selective  Result Date: 12/30/2019 EXAM: ADDITIONAL ARTERIOGRAPHY; IR ULTRASOUND GUIDANCE VASC ACCESS RIGHT; THROMBECTOMY MECHANICAL; BILATERAL PULMONARY ARTERIOGRAPHY COMPARISON:  CT a chest 12/29/2019 MEDICATIONS: 10000 units heparin ANESTHESIA/SEDATION: Versed 2.5 mg IV; Fentanyl 125 mcg IV Moderate Sedation Time:  107 minutes The patient was continuously monitored during the procedure by the interventional radiology nurse under my direct supervision. FLUOROSCOPY TIME:  Fluoroscopy Time: 28 minutes 54 seconds (344 mGy). COMPLICATIONS: None immediate. TECHNIQUE: Informed written consent was obtained from the patient after a thorough discussion of the procedural risks, benefits and alternatives. All questions were addressed. Maximal Sterile Barrier Technique was utilized including caps, mask, sterile gowns, sterile gloves, sterile drape, hand hygiene and skin antiseptic. A timeout was performed prior to the initiation of the procedure. The right common femoral vein was interrogated with ultrasound and found to be widely patent. An image was obtained and stored for the medical record. Local anesthesia was attained by infiltration with 1% lidocaine. A small dermatotomy was made. Under real-time sonographic guidance, the vessel was punctured with a 21 gauge micropuncture needle. Using standard technique, the initial micro needle was exchanged over a 0.018 micro wire for a transitional 4 [place] micro sheath. The micro sheath was then exchanged over a 0.035 wire for a 6 French 11 cm vascular sheath. A right iliac venogram was performed through the sheath. No evidence of iliac or caval DVT. An angled pigtail catheter was advanced over a Bentson wire and navigated through the heart and into the main pulmonary artery. The main pulmonary arterial pressure was measured at 42/28 (mean 28) mm Hg. The catheter was navigated into the right main pulmonary artery. Pulmonary  arteriography was performed. There is large volume thrombus within the right main pulmonary artery extending into the right lower lobe and segmental pulmonary arteries. Very poor overall perfusion of the right middle and lower lobes. There is adequate perfusion of the right upper lobe. The pigtail catheter was exchanged over a rose in wire for an angled catheter. The angled catheter was successfully navigated into a right lower lobe peripheral branch. Pulmonary arteriography was performed confirming catheter location and that the vessel was of adequate size. An Amplatz wire was advanced. The catheter was removed. The 24 [place] In[place] aspiration catheter was then successfully advanced over the wire and positioned in the right main pulmonary artery. Suction aspiration was performed. There was extirpation of thrombus material from the right lower lobe pulmonary artery. Follow-up pulmonary arteriography demonstrates persistent and likely chronic thrombus adherent to the inferior wall of the right main pulmonary artery. The Nitinol discs were advanced through the catheter and used to agitated the residual chronic thrombus. This was performed twice. Additional aspiration was performed with extirpation of additional thrombus. Follow-up venography demonstrates improved flow into the right lower lobe pulmonary artery with improved parenchymal blush, particularly in the right lower lobe. There is still persistent residual chronic thrombus along the inferior wall of the right lower lobe pulmonary artery. The aspiration catheter was brought back into the main pulmonary artery. The angled catheter was introduced coaxially in used to select the left main pulmonary artery. Arteriography was performed. There is large volume nearly occlusive thrombus in the left main pulmonary artery extending into the left lower pulmonary artery. The angled catheter was successfully advanced into a peripheral branch of the left lower lobe pulmonary  artery. Arteriography was performed again confirming catheter location and terminal pulmonary arterial size. The Amplatz wire was advanced into the artery. The catheter was removed. The 84 French aspiration catheter was then advanced over the wire. Aspiration was performed without successful thrombus retrieval. Therefore, the 16 French aspiration catheter was advanced coaxially through the 24 French catheter and used to engage the thrombus. Aspiration was performed with successful extirpation of acute and chronic thrombus material. Follow-up arteriography demonstrates improved flow in the lower lobe pulmonary artery with increased parenchymal blush. There is some residual chronic thrombus remaining. However, at this point in the case, blood loss was approximately 500 mL. Repeat pulmonary arterial pressures were obtained. The mean main pulmonary arterial pressure has decreased 24 mm Hg. Patient is doing well and hemodynamically stable. The decision was made to pursue no further attempts at extirpation of the residual chronic thrombus. FINDINGS: Pre thrombectomy main mean pulmonary arterial pressure 28 mm Hg. Post extirpation mean pulmonary arterial pressure 24 mm Hg. IMPRESSION: 1. Large volume bilateral pulmonary emboli with resultant pulmonary arterial hypertension. 2. Successful extirpation of acute and chronic thrombus material from the bilateral main and lower lobe pulmonary arteries. 3. Improved pulmonary arterial hypertension. Signed, Criselda Peaches, MD, [facility] Radiology Electronically Signed   By: Jacqulynn Cadet M.D.   On: 12/30/2019 15:41   IR Angiogram Selective Each Additional Vessel  Result Date: 12/30/2019 EXAM: ADDITIONAL ARTERIOGRAPHY; IR ULTRASOUND GUIDANCE VASC ACCESS RIGHT; THROMBECTOMY MECHANICAL; BILATERAL PULMONARY ARTERIOGRAPHY COMPARISON:  CT a chest 12/29/2019 MEDICATIONS: 10000 units heparin ANESTHESIA/SEDATION: Versed 2.5  mg IV; Fentanyl 125 mcg IV Moderate Sedation Time:  107 minutes The patient was continuously monitored during the procedure by the interventional radiology nurse under my direct supervision. FLUOROSCOPY TIME:  Fluoroscopy Time: 28 minutes 54 seconds (344 mGy). COMPLICATIONS: None immediate. TECHNIQUE: Informed written consent was obtained from the patient after a thorough discussion of the procedural risks, benefits and alternatives. All questions were addressed. Maximal Sterile Barrier Technique was utilized including caps, mask, sterile gowns, sterile gloves, sterile drape, hand hygiene and skin antiseptic. A timeout was performed prior to the initiation of the procedure. The right common femoral vein was interrogated with ultrasound and found to be widely patent. An image was obtained and stored for the medical record. Local anesthesia was attained by infiltration with 1% lidocaine. A small dermatotomy was made. Under real-time sonographic guidance, the vessel was punctured with a 21 gauge micropuncture needle. Using standard technique, the initial micro needle was exchanged over a 0.018 micro wire for a transitional 4 [place] micro sheath. The micro sheath was then exchanged over a 0.035 wire for a 6 French 11 cm vascular sheath. A right iliac venogram was performed through the sheath. No evidence of iliac or caval DVT. An angled pigtail catheter was advanced over a Bentson wire and navigated through the heart and into the main pulmonary artery. The main pulmonary arterial pressure was measured at 42/28 (mean 28) mm Hg. The catheter was navigated into the right main pulmonary artery. Pulmonary arteriography was performed. There is large volume thrombus within the right main pulmonary artery extending into the right lower lobe and segmental pulmonary arteries. Very poor overall perfusion of the right middle and lower lobes. There is adequate perfusion of the right upper lobe. The pigtail catheter was exchanged  over a rose in wire for an angled catheter. The angled catheter was successfully navigated into a right lower lobe peripheral branch. Pulmonary arteriography was performed confirming  catheter location and that the vessel was of adequate size. An Amplatz wire was advanced. The catheter was removed. The 24 [place] Inari aspiration catheter was then successfully advanced over the wire and positioned in the right main pulmonary artery. Suction aspiration was performed. There was extirpation of thrombus material from the right lower lobe pulmonary artery. Follow-up pulmonary arteriography demonstrates persistent and likely chronic thrombus adherent to the inferior wall of the right main pulmonary artery. The Nitinol discs were advanced through the catheter and used to agitated the residual chronic thrombus. This was performed twice. Additional aspiration was performed with extirpation of additional thrombus. Follow-up venography demonstrates improved flow into the right lower lobe pulmonary artery with improved parenchymal blush, particularly in the right lower lobe. There is still persistent residual chronic thrombus along the inferior wall of the right lower lobe pulmonary artery. The aspiration catheter was brought back into the main pulmonary artery. The angled catheter was introduced coaxially in used to select the left main pulmonary artery. Arteriography was performed. There is large volume nearly occlusive thrombus in the left main pulmonary artery extending into the left lower pulmonary artery. The angled catheter was successfully advanced into a peripheral branch of the left lower lobe pulmonary artery. Arteriography was performed again confirming catheter location and terminal pulmonary arterial size. The Amplatz wire was advanced into the artery. The catheter was removed. The 38 French aspiration catheter was then advanced over the wire. Aspiration was performed without successful thrombus retrieval.  Therefore, the 16 French aspiration catheter was advanced coaxially through the 24 French catheter and used to engage the thrombus. Aspiration was performed with successful extirpation of acute and chronic thrombus material. Follow-up arteriography demonstrates improved flow in the lower lobe pulmonary artery with increased parenchymal blush. There is some residual chronic thrombus remaining. However, at this point in the case, blood loss was approximately 500 mL. Repeat pulmonary arterial pressures were obtained. The mean main pulmonary arterial pressure has decreased 24 mm Hg. Patient is doing well and hemodynamically stable. The decision was made to pursue no further attempts at extirpation of the residual chronic thrombus. FINDINGS: Pre thrombectomy main mean pulmonary arterial pressure 28 mm Hg. Post extirpation mean pulmonary arterial pressure 24 mm Hg. IMPRESSION: 1. Large volume bilateral pulmonary emboli with resultant pulmonary arterial hypertension. 2. Successful extirpation of acute and chronic thrombus material from the bilateral main and lower lobe pulmonary arteries. 3. Improved pulmonary arterial hypertension. Signed, Criselda Peaches, MD, [place] [facility] Electronically Signed   By: Jacqulynn Cadet M.D.   On: 12/30/2019 15:41   IR Angiogram Selective Each Additional Vessel  Result Date: 12/30/2019 EXAM: ADDITIONAL ARTERIOGRAPHY; IR ULTRASOUND GUIDANCE VASC ACCESS RIGHT; THROMBECTOMY MECHANICAL; BILATERAL PULMONARY ARTERIOGRAPHY COMPARISON:  CT a chest 12/29/2019 MEDICATIONS: 10000 units heparin ANESTHESIA/SEDATION: Versed 2.5 mg IV; Fentanyl 125 mcg IV Moderate Sedation Time:  107 minutes The patient was continuously monitored during the procedure by the interventional radiology nurse under my direct supervision. FLUOROSCOPY TIME:  Fluoroscopy Time: 28 minutes 54 seconds (344 mGy). COMPLICATIONS: None immediate. TECHNIQUE: Informed  written consent was obtained from the patient after a thorough discussion of the procedural risks, benefits and alternatives. All questions were addressed. Maximal Sterile Barrier Technique was utilized including caps, mask, sterile gowns, sterile gloves, sterile drape, hand hygiene and skin antiseptic. A timeout was performed prior to the initiation of the procedure. The right common femoral vein was interrogated with ultrasound and found to be widely patent. An image was  obtained and stored for the medical record. Local anesthesia was attained by infiltration with 1% lidocaine. A small dermatotomy was made. Under real-time sonographic guidance, the vessel was punctured with a 21 gauge micropuncture needle. Using standard technique, the initial micro needle was exchanged over a 0.018 micro wire for a transitional 4 [place] micro sheath. The micro sheath was then exchanged over a 0.035 wire for a 6 French 11 cm vascular sheath. A right iliac venogram was performed through the sheath. No evidence of iliac or caval DVT. An angled pigtail catheter was advanced over a Bentson wire and navigated through the heart and into the main pulmonary artery. The main pulmonary arterial pressure was measured at 42/28 (mean 28) mm Hg. The catheter was navigated into the right main pulmonary artery. Pulmonary arteriography was performed. There is large volume thrombus within the right main pulmonary artery extending into the right lower lobe and segmental pulmonary arteries. Very poor overall perfusion of the right middle and lower lobes. There is adequate perfusion of the right upper lobe. The pigtail catheter was exchanged over a rose in wire for an angled catheter. The angled catheter was successfully navigated into a right lower lobe peripheral branch. Pulmonary arteriography was performed confirming catheter location and that the vessel was of adequate size. An Amplatz wire was advanced. The catheter was removed. The 24 [place]  Inari aspiration catheter was then successfully advanced over the wire and positioned in the right main pulmonary artery. Suction aspiration was performed. There was extirpation of thrombus material from the right lower lobe pulmonary artery. Follow-up pulmonary arteriography demonstrates persistent and likely chronic thrombus adherent to the inferior wall of the right main pulmonary artery. The Nitinol discs were advanced through the catheter and used to agitated the residual chronic thrombus. This was performed twice. Additional aspiration was performed with extirpation of additional thrombus. Follow-up venography demonstrates improved flow into the right lower lobe pulmonary artery with improved parenchymal blush, particularly in the right lower lobe. There is still persistent residual chronic thrombus along the inferior wall of the right lower lobe pulmonary artery. The aspiration catheter was brought back into the main pulmonary artery. The angled catheter was introduced coaxially in used to select the left main pulmonary artery. Arteriography was performed. There is large volume nearly occlusive thrombus in the left main pulmonary artery extending into the left lower pulmonary artery. The angled catheter was successfully advanced into a peripheral branch of the left lower lobe pulmonary artery. Arteriography was performed again confirming catheter location and terminal pulmonary arterial size. The Amplatz wire was advanced into the artery. The catheter was removed. The 62 French aspiration catheter was then advanced over the wire. Aspiration was performed without successful thrombus retrieval. Therefore, the 16 French aspiration catheter was advanced coaxially through the 24 French catheter and used to engage the thrombus. Aspiration was performed with successful extirpation of acute and chronic thrombus material. Follow-up arteriography demonstrates improved flow in the lower lobe pulmonary artery with  increased parenchymal blush. There is some residual chronic thrombus remaining. However, at this point in the case, blood loss was approximately 500 mL. Repeat pulmonary arterial pressures were obtained. The mean main pulmonary arterial pressure has decreased 24 mm Hg. Patient is doing well and hemodynamically stable. The decision was made to pursue no further attempts at extirpation of the residual chronic thrombus. FINDINGS: Pre thrombectomy main mean pulmonary arterial pressure 28 mm Hg. Post extirpation mean pulmonary arterial pressure 24 mm Hg. IMPRESSION: 1. Large volume bilateral pulmonary  emboli with resultant pulmonary arterial hypertension. 2. Successful extirpation of acute and chronic thrombus material from the bilateral main and lower lobe pulmonary arteries. 3. Improved pulmonary arterial hypertension. Signed, Criselda Peaches, MD, [facility] Radiology Electronically Signed   By: Jacqulynn Cadet M.D.   On: 12/30/2019 15:41   CARDIAC CATHETERIZATION  Result Date: 01/02/2020  Normal coronary arteries.  No evidence of left-to-right shunting by oximetry.  Mild elevation in pulmonary artery pressures.  Technically unable to obtain pulmonary capillary wedge pressure.  Normal left ventricular end-diastolic pressure. RECOMMENDATIONS:  Resume anticoagulation therapy for pulmonary embolism as soon as possible.  IR THROMBECT PRIM MECH INIT (INCLU) MOD SED  Result Date: 12/30/2019 EXAM: ADDITIONAL ARTERIOGRAPHY; IR ULTRASOUND GUIDANCE VASC ACCESS RIGHT; THROMBECTOMY MECHANICAL; BILATERAL PULMONARY ARTERIOGRAPHY COMPARISON:  CT a chest 12/29/2019 MEDICATIONS: 10000 units heparin ANESTHESIA/SEDATION: Versed 2.5 mg IV; Fentanyl 125 mcg IV Moderate Sedation Time:  107 minutes The patient was continuously monitored during the procedure by the interventional radiology nurse under my direct supervision. FLUOROSCOPY TIME:  Fluoroscopy Time: 28  minutes 54 seconds (344 mGy). COMPLICATIONS: None immediate. TECHNIQUE: Informed written consent was obtained from the patient after a thorough discussion of the procedural risks, benefits and alternatives. All questions were addressed. Maximal Sterile Barrier Technique was utilized including caps, mask, sterile gowns, sterile gloves, sterile drape, hand hygiene and skin antiseptic. A timeout was performed prior to the initiation of the procedure. The right common femoral vein was interrogated with ultrasound and found to be widely patent. An image was obtained and stored for the medical record. Local anesthesia was attained by infiltration with 1% lidocaine. A small dermatotomy was made. Under real-time sonographic guidance, the vessel was punctured with a 21 gauge micropuncture needle. Using standard technique, the initial micro needle was exchanged over a 0.018 micro wire for a transitional 4 [place] micro sheath. The micro sheath was then exchanged over a 0.035 wire for a 6 French 11 cm vascular sheath. A right iliac venogram was performed through the sheath. No evidence of iliac or caval DVT. An angled pigtail catheter was advanced over a Bentson wire and navigated through the heart and into the main pulmonary artery. The main pulmonary arterial pressure was measured at 42/28 (mean 28) mm Hg. The catheter was navigated into the right main pulmonary artery. Pulmonary arteriography was performed. There is large volume thrombus within the right main pulmonary artery extending into the right lower lobe and segmental pulmonary arteries. Very poor overall perfusion of the right middle and lower lobes. There is adequate perfusion of the right upper lobe. The pigtail catheter was exchanged over a rose in wire for an angled catheter. The angled catheter was successfully navigated into a right lower lobe peripheral branch. Pulmonary arteriography was performed confirming catheter location and that the vessel was of  adequate size. An Amplatz wire was advanced. The catheter was removed. The 24 [place] Inari aspiration catheter was then successfully advanced over the wire and positioned in the right main pulmonary artery. Suction aspiration was performed. There was extirpation of thrombus material from the right lower lobe pulmonary artery. Follow-up pulmonary arteriography demonstrates persistent and likely chronic thrombus adherent to the inferior wall of the right main pulmonary artery. The Nitinol discs were advanced through the catheter and used to agitated the residual chronic thrombus. This was performed twice. Additional aspiration was performed with extirpation of additional thrombus. Follow-up venography demonstrates improved flow into the right lower lobe pulmonary artery with improved parenchymal  blush, particularly in the right lower lobe. There is still persistent residual chronic thrombus along the inferior wall of the right lower lobe pulmonary artery. The aspiration catheter was brought back into the main pulmonary artery. The angled catheter was introduced coaxially in used to select the left main pulmonary artery. Arteriography was performed. There is large volume nearly occlusive thrombus in the left main pulmonary artery extending into the left lower pulmonary artery. The angled catheter was successfully advanced into a peripheral branch of the left lower lobe pulmonary artery. Arteriography was performed again confirming catheter location and terminal pulmonary arterial size. The Amplatz wire was advanced into the artery. The catheter was removed. The 38 French aspiration catheter was then advanced over the wire. Aspiration was performed without successful thrombus retrieval. Therefore, the 16 French aspiration catheter was advanced coaxially through the 24 French catheter and used to engage the thrombus. Aspiration was performed with successful extirpation of acute and chronic thrombus material. Follow-up  arteriography demonstrates improved flow in the lower lobe pulmonary artery with increased parenchymal blush. There is some residual chronic thrombus remaining. However, at this point in the case, blood loss was approximately 500 mL. Repeat pulmonary arterial pressures were obtained. The mean main pulmonary arterial pressure has decreased 24 mm Hg. Patient is doing well and hemodynamically stable. The decision was made to pursue no further attempts at extirpation of the residual chronic thrombus. FINDINGS: Pre thrombectomy main mean pulmonary arterial pressure 28 mm Hg. Post extirpation mean pulmonary arterial pressure 24 mm Hg. IMPRESSION: 1. Large volume bilateral pulmonary emboli with resultant pulmonary arterial hypertension. 2. Successful extirpation of acute and chronic thrombus material from the bilateral main and lower lobe pulmonary arteries. 3. Improved pulmonary arterial hypertension. Signed, Criselda Peaches, MD, [place] [facility] Radiology Electronically Signed   By: Jacqulynn Cadet M.D.   On: 12/30/2019 15:41   IR THROMBECT PRIM MECH ADD (INCLU) MOD SED  Result Date: 12/30/2019 EXAM: ADDITIONAL ARTERIOGRAPHY; IR ULTRASOUND GUIDANCE VASC ACCESS RIGHT; THROMBECTOMY MECHANICAL; BILATERAL PULMONARY ARTERIOGRAPHY COMPARISON:  CT a chest 12/29/2019 MEDICATIONS: 10000 units heparin ANESTHESIA/SEDATION: Versed 2.5 mg IV; Fentanyl 125 mcg IV Moderate Sedation Time:  107 minutes The patient was continuously monitored during the procedure by the interventional radiology nurse under my direct supervision. FLUOROSCOPY TIME:  Fluoroscopy Time: 28 minutes 54 seconds (344 mGy). COMPLICATIONS: None immediate. TECHNIQUE: Informed written consent was obtained from the patient after a thorough discussion of the procedural risks, benefits and alternatives. All questions were addressed. Maximal Sterile Barrier Technique was utilized including caps, mask, sterile  gowns, sterile gloves, sterile drape, hand hygiene and skin antiseptic. A timeout was performed prior to the initiation of the procedure. The right common femoral vein was interrogated with ultrasound and found to be widely patent. An image was obtained and stored for the medical record. Local anesthesia was attained by infiltration with 1% lidocaine. A small dermatotomy was made. Under real-time sonographic guidance, the vessel was punctured with a 21 gauge micropuncture needle. Using standard technique, the initial micro needle was exchanged over a 0.018 micro wire for a transitional 4 [place] micro sheath. The micro sheath was then exchanged over a 0.035 wire for a 6 French 11 cm vascular sheath. A right iliac venogram was performed through the sheath. No evidence of iliac or caval DVT. An angled pigtail catheter was advanced over a Bentson wire and navigated through the heart and into the main pulmonary artery. The main pulmonary arterial pressure was measured at  42/28 (mean 28) mm Hg. The catheter was navigated into the right main pulmonary artery. Pulmonary arteriography was performed. There is large volume thrombus within the right main pulmonary artery extending into the right lower lobe and segmental pulmonary arteries. Very poor overall perfusion of the right middle and lower lobes. There is adequate perfusion of the right upper lobe. The pigtail catheter was exchanged over a rose in wire for an angled catheter. The angled catheter was successfully navigated into a right lower lobe peripheral branch. Pulmonary arteriography was performed confirming catheter location and that the vessel was of adequate size. An Amplatz wire was advanced. The catheter was removed. The 24 [place] Inari aspiration catheter was then successfully advanced over the wire and positioned in the right main pulmonary artery. Suction aspiration was performed. There was extirpation of thrombus material from the right lower lobe pulmonary  artery. Follow-up pulmonary arteriography demonstrates persistent and likely chronic thrombus adherent to the inferior wall of the right main pulmonary artery. The Nitinol discs were advanced through the catheter and used to agitated the residual chronic thrombus. This was performed twice. Additional aspiration was performed with extirpation of additional thrombus. Follow-up venography demonstrates improved flow into the right lower lobe pulmonary artery with improved parenchymal blush, particularly in the right lower lobe. There is still persistent residual chronic thrombus along the inferior wall of the right lower lobe pulmonary artery. The aspiration catheter was brought back into the main pulmonary artery. The angled catheter was introduced coaxially in used to select the left main pulmonary artery. Arteriography was performed. There is large volume nearly occlusive thrombus in the left main pulmonary artery extending into the left lower pulmonary artery. The angled catheter was successfully advanced into a peripheral branch of the left lower lobe pulmonary artery. Arteriography was performed again confirming catheter location and terminal pulmonary arterial size. The Amplatz wire was advanced into the artery. The catheter was removed. The 59 French aspiration catheter was then advanced over the wire. Aspiration was performed without successful thrombus retrieval. Therefore, the 16 French aspiration catheter was advanced coaxially through the 24 French catheter and used to engage the thrombus. Aspiration was performed with successful extirpation of acute and chronic thrombus material. Follow-up arteriography demonstrates improved flow in the lower lobe pulmonary artery with increased parenchymal blush. There is some residual chronic thrombus remaining. However, at this point in the case, blood loss was approximately 500 mL. Repeat pulmonary arterial pressures were obtained. The mean main pulmonary arterial  pressure has decreased 24 mm Hg. Patient is doing well and hemodynamically stable. The decision was made to pursue no further attempts at extirpation of the residual chronic thrombus. FINDINGS: Pre thrombectomy main mean pulmonary arterial pressure 28 mm Hg. Post extirpation mean pulmonary arterial pressure 24 mm Hg. IMPRESSION: 1. Large volume bilateral pulmonary emboli with resultant pulmonary arterial hypertension. 2. Successful extirpation of acute and chronic thrombus material from the bilateral main and lower lobe pulmonary arteries. 3. Improved pulmonary arterial hypertension. Signed, Criselda Peaches, MD, [place] [facility] Radiology Electronically Signed   By: Jacqulynn Cadet M.D.   On: 12/30/2019 15:41   IR US Guide Vasc Access Right  Result Date: 12/30/2019 EXAM: ADDITIONAL ARTERIOGRAPHY; IR ULTRASOUND GUIDANCE VASC ACCESS RIGHT; THROMBECTOMY MECHANICAL; BILATERAL PULMONARY ARTERIOGRAPHY COMPARISON:  CT a chest 12/29/2019 MEDICATIONS: 10000 units heparin ANESTHESIA/SEDATION: Versed 2.5 mg IV; Fentanyl 125 mcg IV Moderate Sedation Time:  107 minutes The patient was continuously monitored during the procedure by the interventional radiology  nurse under my direct supervision. FLUOROSCOPY TIME:  Fluoroscopy Time: 28 minutes 54 seconds (344 mGy). COMPLICATIONS: None immediate. TECHNIQUE: Informed written consent was obtained from the patient after a thorough discussion of the procedural risks, benefits and alternatives. All questions were addressed. Maximal Sterile Barrier Technique was utilized including caps, mask, sterile gowns, sterile gloves, sterile drape, hand hygiene and skin antiseptic. A timeout was performed prior to the initiation of the procedure. The right common femoral vein was interrogated with ultrasound and found to be widely patent. An image was obtained and stored for the medical record. Local anesthesia was attained by infiltration  with 1% lidocaine. A small dermatotomy was made. Under real-time sonographic guidance, the vessel was punctured with a 21 gauge micropuncture needle. Using standard technique, the initial micro needle was exchanged over a 0.018 micro wire for a transitional 4 [place] micro sheath. The micro sheath was then exchanged over a 0.035 wire for a 6 French 11 cm vascular sheath. A right iliac venogram was performed through the sheath. No evidence of iliac or caval DVT. An angled pigtail catheter was advanced over a Bentson wire and navigated through the heart and into the main pulmonary artery. The main pulmonary arterial pressure was measured at 42/28 (mean 28) mm Hg. The catheter was navigated into the right main pulmonary artery. Pulmonary arteriography was performed. There is large volume thrombus within the right main pulmonary artery extending into the right lower lobe and segmental pulmonary arteries. Very poor overall perfusion of the right middle and lower lobes. There is adequate perfusion of the right upper lobe. The pigtail catheter was exchanged over a rose in wire for an angled catheter. The angled catheter was successfully navigated into a right lower lobe peripheral branch. Pulmonary arteriography was performed confirming catheter location and that the vessel was of adequate size. An Amplatz wire was advanced. The catheter was removed. The 24 [place] Inari aspiration catheter was then successfully advanced over the wire and positioned in the right main pulmonary artery. Suction aspiration was performed. There was extirpation of thrombus material from the right lower lobe pulmonary artery. Follow-up pulmonary arteriography demonstrates persistent and likely chronic thrombus adherent to the inferior wall of the right main pulmonary artery. The Nitinol discs were advanced through the catheter and used to agitated the residual chronic thrombus. This was performed twice. Additional aspiration was performed with  extirpation of additional thrombus. Follow-up venography demonstrates improved flow into the right lower lobe pulmonary artery with improved parenchymal blush, particularly in the right lower lobe. There is still persistent residual chronic thrombus along the inferior wall of the right lower lobe pulmonary artery. The aspiration catheter was brought back into the main pulmonary artery. The angled catheter was introduced coaxially in used to select the left main pulmonary artery. Arteriography was performed. There is large volume nearly occlusive thrombus in the left main pulmonary artery extending into the left lower pulmonary artery. The angled catheter was successfully advanced into a peripheral branch of the left lower lobe pulmonary artery. Arteriography was performed again confirming catheter location and terminal pulmonary arterial size. The Amplatz wire was advanced into the artery. The catheter was removed. The 33 French aspiration catheter was then advanced over the wire. Aspiration was performed without successful thrombus retrieval. Therefore, the 16 French aspiration catheter was advanced coaxially through the 24 French catheter and used to engage the thrombus. Aspiration was performed with successful extirpation of acute and chronic thrombus material. Follow-up arteriography demonstrates improved flow in the lower lobe  pulmonary artery with increased parenchymal blush. There is some residual chronic thrombus remaining. However, at this point in the case, blood loss was approximately 500 mL. Repeat pulmonary arterial pressures were obtained. The mean main pulmonary arterial pressure has decreased 24 mm Hg. Patient is doing well and hemodynamically stable. The decision was made to pursue no further attempts at extirpation of the residual chronic thrombus. FINDINGS: Pre thrombectomy main mean pulmonary arterial pressure 28 mm Hg. Post extirpation mean pulmonary arterial pressure 24 mm Hg. IMPRESSION: 1.  Large volume bilateral pulmonary emboli with resultant pulmonary arterial hypertension. 2. Successful extirpation of acute and chronic thrombus material from the bilateral main and lower lobe pulmonary arteries. 3. Improved pulmonary arterial hypertension. Signed, Criselda Peaches, MD, [place] [facility] Radiology Electronically Signed   By: Jacqulynn Cadet M.D.   On: 12/30/2019 15:41   DG Chest Port 1 View  Result Date: 12/29/2019 CLINICAL DATA:  Hypoxia shortness of breath. EXAM: PORTABLE CHEST 1 VIEW COMPARISON:  07/25/2018 FINDINGS: Globular appearance of cardiac silhouette with cardiac enlargement that is likely also accentuated by portable technique. Hilar structures are unremarkable. Patchy opacities in the RIGHT and LEFT mid chest. No dense consolidation. No sign of pleural effusion. Visualized skeletal structures are unremarkable. IMPRESSION: Patchy opacities in the RIGHT and LEFT mid chest, potentially related to developing infection, viral or atypical process. Given history of trauma on the side of trauma small area of contusion is also considered. Globular cardiac silhouette.  Pericardial effusion not excluded. Electronically Signed   By: Zetta Bills M.D.   On: 12/29/2019 17:32   ECHOCARDIOGRAM COMPLETE  Result Date: 12/30/2019    ECHOCARDIOGRAM LIMITED REPORT   Patient Name:   Cody Joyce Date of Exam: 12/30/2019 Medical Rec #:  [id number]       Height:       73.0 in Accession #:    [id number]      Weight:       185.9 lb Date of Birth:  1961-07-05        BSA:          2.086 m Patient Age:    59 years        BP:           142/106 mmHg Patient Gender: M               HR:           90 bpm. Exam Location:  Inpatient Procedure: 2D Echo, Cardiac Doppler and Color Doppler Indications:    I26.02 Pulmonary embolus  History:        Patient has no prior history of Echocardiogram examinations.  Sonographer:    Merrie Roof RDCS Referring Phys: [id number]  [place]  1. Left ventricular ejection fraction, by estimation, is 35 to 40%. The left ventricle has moderately decreased function. The left ventricle demonstrates global hypokinesis. Left ventricular diastolic parameters are consistent with Grade I diastolic dysfunction (impaired relaxation). There is the interventricular septum is flattened in systole and diastole, consistent with right ventricular pressure and volume overload.  2. Right ventricular systolic function is moderately reduced.  3. Right atrial size was moderately dilated.  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 Trivial mitral valve regurgitation.  5. Unable to obtain adequate Doppler signal to estimate pulmonary pressures. Tricuspid valve regurgitation is trivial. FINDINGS  Left Ventricle: Left ventricular ejection fraction, by estimation, is 35 to 40%. The left ventricle has moderately decreased function. The left  ventricle demonstrates global hypokinesis. The interventricular septum is flattened in systole and diastole, consistent with right ventricular pressure and volume overload. Right Ventricle: Right ventricular systolic function is moderately reduced. Right Atrium: Right atrial size was moderately dilated. Mitral Valve: The mitral valve is myxomatous. There is moderate thickening of the mitral valve leaflet(s). There is moderate calcification of the mitral valve leaflet(s). Trivial mitral valve regurgitation. Tricuspid Valve: Unable to obtain adequate Doppler signal to estimate pulmonary pressures. The tricuspid valve is normal in structure. Tricuspid valve regurgitation is trivial. Pulmonic Valve: Pulmonic valve regurgitation is mild.  LEFT VENTRICLE PLAX 2D LVIDd:         4.40 cm     Diastology LVIDs:         4.50 cm     LV e' lateral:   6.09 cm/s LV PW:         0.90 cm     LV E/e' lateral: 5.6 LV IVS:        1.00 cm     LV e' medial:    4.68 cm/s LVOT diam:     2.00 cm     LV E/e' medial:  7.2 LV SV:         30 LV SV Index:    14 LVOT Area:     3.14 cm  LV Volumes (MOD) LV vol d, MOD A2C: 97.5 ml LV vol d, MOD A4C: 92.5 ml LV vol s, MOD A2C: 71.5 ml LV vol s, MOD A4C: 47.8 ml LV SV MOD A2C:     26.0 ml LV SV MOD A4C:     92.5 ml LV SV MOD BP:      37.6 ml RIGHT VENTRICLE            IVC RV Basal diam:  5.20 cm    IVC diam: 2.00 cm RV Mid diam:    4.90 cm RV S prime:     9.68 cm/s TAPSE (M-mode): 1.4 cm LEFT ATRIUM             Index       RIGHT ATRIUM           Index LA diam:        3.60 cm 1.73 cm/m  RA Area:     24.50 cm LA Vol (A2C):   71.4 ml 34.23 ml/m RA Volume:   89.10 ml  42.72 ml/m LA Vol (A4C):   32.8 ml 15.73 ml/m LA Biplane Vol: 49.0 ml 23.49 ml/m  AORTIC VALVE LVOT Vmax:   66.60 cm/s LVOT Vmean:  36.200 cm/s LVOT VTI:    0.096 m  AORTA Ao Root diam: 3.20 cm Ao Asc diam:  3.00 cm MITRAL VALVE MV Area (PHT): 5.23 cm    SHUNTS MV Decel Time: 145 msec    Systemic VTI:  0.10 m MV E velocity: 33.80 cm/s  Systemic Diam: 2.00 cm MV A velocity: 55.70 cm/s MV E/A ratio:  0.61 Candee Furbish MD Electronically signed by Candee Furbish MD Signature Date/Time: 12/30/2019/2:26:36 PM    Final    ECHOCARDIOGRAM LIMITED BUBBLE STUDY  Result Date: 12/31/2019    ECHOCARDIOGRAM LIMITED REPORT   Patient Name:   MOSS SEURER Date of Exam: 12/31/2019 Medical Rec #:  [id number]       Height:       73.0 in Accession #:    [id number]      Weight:       174.8 lb Date of Birth:  18-Aug-1961  BSA:          2.032 m Patient Age:    81 years        BP:           143/99 mmHg Patient Gender: M               HR:           91 bpm. Exam Location:  Inpatient Procedure: Limited Echo and Saline Contrast Bubble Study Indications:    Renal infarct  History:        Patient has prior history of Echocardiogram examinations, most                 recent 12/30/2019. COPD and Pulomary embolus;                 Signs/Symptoms:Shortness of Breath and Chest Pain. Resp.                 failure.  Sonographer:    Dustin Flock Referring Phys: [id number] Lance Coon R ELTARABOULSI  IMPRESSIONS  1. Agitated saline contrast bubble study was positive with shunting observed within 5 cardiac cycles suggestive of interatrial shunt.  2. Right ventricular systolic function is mildly reduced. The right ventricular size is moderately enlarged.  3. Remainder of findings per transthoracic echocardiogram 12/30/19. FINDINGS  Right Ventricle: The right ventricular size is moderately enlarged. Right ventricular systolic function is mildly reduced. IAS/Shunts: There is left bowing of the interatrial septum, suggestive of elevated right atrial pressure. Agitated saline contrast was given intravenously to evaluate for intracardiac shunting. Agitated saline contrast bubble study was positive with shunting observed within 3-6 cardiac cycles suggestive of interatrial shunt. Cherlynn Kaiser MD Electronically signed by Cherlynn Kaiser MD Signature Date/Time: 12/31/2019/6:16:01 PM    Final    ECHO TEE  Result Date: 01/03/2020    TRANSESOPHOGEAL ECHO REPORT   Patient Name:   KEIMONI SKUSE Date of Exam: 01/03/2020 Medical Rec #:  [id number]       Height:       72.0 in Accession #:    [id number]      Weight:       185.0 lb Date of Birth:  13-Aug-1961        BSA:          2.061 m Patient Age:    32 years        BP:           79/61 mmHg Patient Gender: M               HR:           77 bpm. Exam Location:  Inpatient Procedure: Transesophageal Echo, Cardiac Doppler, Color Doppler, Saline Contrast            Bubble Study and Intracardiac Opacification Agent Indications:     possible atrial septal defect  History:         Patient has prior history of Echocardiogram examinations, most                  recent 12/31/2019. Pulmonary embolus.  Sonographer:     Johny Chess Referring Phys:  [id number] [place] Diagnosing Phys: Ena Dawley MD PROCEDURE: After discussion of the risks and benefits of a TEE, an informed consent was obtained from the patient. The transesophogeal probe was passed without difficulty through  the esophogus of the patient. Sedation performed by different physician. The patient was monitored while under deep sedation. Anesthestetic sedation  was provided intravenously by Anesthesiology: 172.5mg  of Propofol. The patient's vital signs; including heart rate, blood pressure, and oxygen saturation; remained stable throughout the procedure. The patient developed no complications during the procedure. IMPRESSIONS  1. There is a smoke in the left ventricular apex with no evidence for a thrombus on Definity echo contrast images.. Left ventricular ejection fraction, by estimation, is 30 to 35%. The left ventricle has moderately decreased function. The left ventricle  demonstrates global hypokinesis. There is the interventricular septum is flattened in systole and diastole, consistent with right ventricular pressure and volume overload.  2. Right ventricular systolic function is severely reduced. The right ventricular size is severely enlarged. There is normal pulmonary artery systolic pressure.  3. Left atrial size was moderately dilated. No left atrial/left atrial appendage thrombus was detected.  4. Right atrial size was moderately dilated.  5. The mitral valve is normal in structure. Mild mitral valve regurgitation. No evidence of mitral stenosis.  6. The aortic valve is normal in structure. Aortic valve regurgitation is not visualized. No aortic stenosis is present.  7. The inferior vena cava is normal in size with greater than 50% respiratory variability, suggesting right atrial pressure of 3 mmHg.  8. Agitated saline contrast bubble study was significantly positive with shunting observed within the first cardiac cycles suggestive of interatrial shunt. There is a large patent foramen ovale with bidirectional shunting across atrial septum. Conclusion(s)/Recommendation(s): Findings are concerning for an interatrial shunt as detailed above. FINDINGS  Left Ventricle: There is a smoke in the left ventricular apex  with no evidence for a thrombus on Definity echo contrast images. Left ventricular ejection fraction, by estimation, is 30 to 35%. The left ventricle has moderately decreased function. The left ventricle demonstrates global hypokinesis. Definity contrast agent was given IV to delineate the left ventricular endocardial borders. The left ventricular internal cavity size was normal in size. There is no left ventricular hypertrophy. The interventricular septum is flattened in systole and diastole, consistent with right ventricular pressure and volume overload. Right Ventricle: The right ventricular size is severely enlarged. No increase in right ventricular wall thickness. Right ventricular systolic function is severely reduced. There is normal pulmonary artery systolic pressure. The tricuspid regurgitant velocity is 2.41 m/s, and with an assumed right atrial pressure of 10 mmHg, the estimated right ventricular systolic pressure is [id number] mmHg. Left Atrium: Left atrial size was moderately dilated. No left atrial/left atrial appendage thrombus was detected. Right Atrium: Right atrial size was moderately dilated. Pericardium: There is no evidence of pericardial effusion. Mitral Valve: The mitral valve is normal in structure. Normal mobility of the mitral valve leaflets. Mild mitral valve regurgitation. No evidence of mitral valve stenosis. Tricuspid Valve: The tricuspid valve is normal in structure. Tricuspid valve regurgitation is mild . No evidence of tricuspid stenosis. Aortic Valve: The aortic valve is normal in structure. Aortic valve regurgitation is not visualized. No aortic stenosis is present. Pulmonic Valve: The pulmonic valve was normal in structure. Pulmonic valve regurgitation is not visualized. No evidence of pulmonic stenosis. Aorta: The aortic root is normal in size and structure. Venous: The inferior vena cava is normal in size with greater than 50% respiratory variability, suggesting right atrial pressure  of 3 mmHg. IAS/Shunts: No atrial level shunt detected by color flow Doppler. Agitated saline contrast was given intravenously to evaluate for intracardiac shunting. Agitated saline contrast bubble study was positive with shunting observed within 3-6 cardiac cycles suggestive of interatrial shunt. A large patent foramen ovale is detected  with bidirectional shunting across atrial septum. There is a atrial septal defect with predominantly left to right shunting across the atrial septum.  TRICUSPID VALVE TR Peak grad:   23.2 mmHg TR Vmax:        241.00 cm/s Ena Dawley MD Electronically signed by Ena Dawley MD Signature Date/Time: 01/03/2020/2:16:17 PM    Final    VAS [place] LOWER EXTREMITY VENOUS (DVT)  Result Date: 12/31/2019  Lower Venous DVTStudy Indications: Pulmonary embolism, and recent left hamtring injury.  Comparison Study: No prior study on file Performing Technologist: Sharion Dove RVS  Examination Guidelines: A complete evaluation includes B-mode imaging, spectral Doppler, color Doppler, and power Doppler as needed of all accessible portions of each vessel. Bilateral testing is considered an integral part of a complete examination. Limited examinations for reoccurring indications may be performed as noted. The reflux portion of the exam is performed with the patient in reverse Trendelenburg.  +---------+---------------+---------+-----------+----------+--------------+ RIGHT    CompressibilityPhasicitySpontaneityPropertiesThrombus Aging +---------+---------------+---------+-----------+----------+--------------+ CFV      Full           No       No                                  +---------+---------------+---------+-----------+----------+--------------+ SFJ      Full                                                        +---------+---------------+---------+-----------+----------+--------------+ FV Prox  Full                                                         +---------+---------------+---------+-----------+----------+--------------+ FV Mid   Full                                                        +---------+---------------+---------+-----------+----------+--------------+ FV DistalFull                                                        +---------+---------------+---------+-----------+----------+--------------+ PFV      Full                                                        +---------+---------------+---------+-----------+----------+--------------+ POP      Full           No       No                   sluggish flow  +---------+---------------+---------+-----------+----------+--------------+ PTV      Full                                                        +---------+---------------+---------+-----------+----------+--------------+  PERO     Full                                                        +---------+---------------+---------+-----------+----------+--------------+   +---------+---------------+---------+-----------+----------+-----------------+ LEFT     CompressibilityPhasicitySpontaneityPropertiesThrombus Aging    +---------+---------------+---------+-----------+----------+-----------------+ CFV      Partial        Yes      Yes                  Age Indeterminate +---------+---------------+---------+-----------+----------+-----------------+ SFJ      Full                                         Age Indeterminate +---------+---------------+---------+-----------+----------+-----------------+ FV Prox  Partial                                      Age Indeterminate +---------+---------------+---------+-----------+----------+-----------------+ FV Mid   Partial                                      Age Indeterminate +---------+---------------+---------+-----------+----------+-----------------+ FV DistalPartial                                      Age Indeterminate  +---------+---------------+---------+-----------+----------+-----------------+ PFV      Full                                                           +---------+---------------+---------+-----------+----------+-----------------+ POP      Partial        Yes      No                   Age Indeterminate +---------+---------------+---------+-----------+----------+-----------------+ PTV      Full                                                           +---------+---------------+---------+-----------+----------+-----------------+ PERO     Full                                                           +---------+---------------+---------+-----------+----------+-----------------+     Summary: RIGHT: - There is no evidence of deep vein thrombosis in the lower extremity.  sluggish flow noted in popliteal vein  LEFT: - Findings consistent with age indeterminate deep vein thrombosis involving the left common femoral vein, SF junction, left femoral vein, and left popliteal vein.  *See table(s) above for measurements and observations. Electronically signed  by Monica Martinez MD on 12/31/2019 at 4:40:35 PM.    Final    ABORTED INVASIVE LAB PROCEDURE  Result Date: 01/01/2020 This case was aborted.   (Echo, Carotid, EGD, Colonoscopy, ERCP)    Subjective: Patient is resting in bed in no acute distress he denies any new complaints he is on oxygen saturation 90% on 3 L.  Discharge Exam: Vitals:   01/04/20 2240 01/05/20 0738  BP: 109/71 117/72  Pulse: 83 86  Resp: 20 19  Temp: (!) 100.4 F (38 C) 98 F (36.7 C)  SpO2: 96% 94%   Vitals:   01/04/20 1524 01/04/20 2240 01/05/20 0300 01/05/20 0738  BP: 123/83 109/71  117/72  Pulse: 87 83  86  Resp: 20 20  19   Temp: 99.1 F (37.3 C) (!) 100.4 F (38 C)  98 F (36.7 C)  TempSrc:  Oral    SpO2: 92% 96%  94%  Weight:   84.7 kg   Height:        General: Pt is alert, awake, not in acute distress Cardiovascular: RRR, S1/S2 +, no  rubs, no gallops Respiratory: CTA bilaterally, no wheezing, no rhonchi Abdominal: Soft, NT, ND, bowel sounds + Extremities: no edema, no cyanosis    The results of significant diagnostics from this hospitalization (including imaging, microbiology, ancillary and laboratory) are listed below for reference.     Microbiology: Recent Results (from the past 240 hour(s))  Blood culture (routine x 2)     Status: None   Collection Time: 12/29/19 10:21 PM   Specimen: BLOOD  Result Value Ref Range Status   Specimen Description BLOOD SITE NOT SPECIFIED  Final   Special Requests   Final    BOTTLES DRAWN AEROBIC AND ANAEROBIC Blood Culture adequate volume   Culture   Final    NO GROWTH 5 DAYS Performed at [facility], [address], [place], [place] [address]    Report Status 01/03/2020 FINAL  Final  Respiratory Panel by RT PCR (Flu A&B, Covid) - Nasopharyngeal Swab     Status: None   Collection Time: 12/29/19 10:21 PM   Specimen: Nasopharyngeal Swab  Result Value Ref Range Status   SARS Coronavirus 2 by RT PCR NEGATIVE NEGATIVE Final    Comment: (NOTE) SARS-CoV-2 target nucleic acids are NOT DETECTED. The SARS-CoV-2 RNA is generally detectable in upper respiratoy specimens during the acute phase of infection. The lowest concentration of SARS-CoV-2 viral copies this assay can detect is 131 copies/mL. A negative result does not preclude SARS-Cov-2 infection and should not be used as the sole basis for treatment or other patient management decisions. A negative result may occur with  improper specimen collection/handling, submission of specimen other than nasopharyngeal swab, presence of viral mutation(s) within the areas targeted by this assay, and inadequate number of viral copies (<131 copies/mL). A negative result must be combined with clinical observations, patient history, and epidemiological information. The expected result is Negative. Fact Sheet for Patients:   PinkCheek.be Fact Sheet for Healthcare Providers:  GravelBags.it This test is not yet ap proved or cleared by the [place] FDA and  has been authorized for detection and/or diagnosis of SARS-CoV-2 by FDA under an Emergency Use Authorization (EUA). This EUA will remain  in effect (meaning this test can be used) for the duration of the COVID-19 declaration under Section 564(b)(1) of the Act, 21 U.S.C. section 360bbb-3(b)(1), unless the authorization is terminated or revoked sooner.    Influenza A by PCR NEGATIVE NEGATIVE  Final   Influenza B by PCR NEGATIVE NEGATIVE Final    Comment: (NOTE) The Xpert Xpress SARS-CoV-2/FLU/RSV assay is intended as an aid in  the diagnosis of influenza from Nasopharyngeal swab specimens and  should not be used as a sole basis for treatment. Nasal washings and  aspirates are unacceptable for Xpert Xpress SARS-CoV-2/FLU/RSV  testing. Fact Sheet for Patients: PinkCheek.be Fact Sheet for Healthcare Providers: GravelBags.it This test is not yet approved or cleared by the [place] FDA and  has been authorized for detection and/or diagnosis of SARS-CoV-2 by  FDA under an Emergency Use Authorization (EUA). This EUA will remain  in effect (meaning this test can be used) for the duration of the  Covid-19 declaration under Section 564(b)(1) of the Act, 21  U.S.C. section 360bbb-3(b)(1), unless the authorization is  terminated or revoked. Performed at [facility], [place] [address], [place], [place] [address]      Labs: BNP (last 3 results) Recent Labs    12/29/19 1633  BNP [id number]*   Basic Metabolic Panel: Recent Labs  Lab 12/30/19 0620 12/30/19 0620 12/31/19 0537 12/31/19 0537 01/01/20 0254 01/01/20 0254 01/02/20 0231 01/02/20 1401 01/02/20 1409 01/02/20 1416 01/03/20 0458 01/04/20 0321 01/05/20 0256  NA 143   <  > 141   < > 139   < > 140   < > 145 144 139 135 135  K 5.5*   < > 4.7   < > 4.4   < > 4.3   < > 4.3 4.3 4.2 4.7 4.5  CL 111   < > 111   < > 108  --  110  --   --   --  112* 108 106  CO2 21*   < > 20*   < > 22  --  11*  --   --   --  19* 20* 19*  GLUCOSE 129*   < > 143*   < > 121*  --  136*  --   --   --  117* 156* 131*  BUN 43*   < > 49*   < > 33*  --  33*  --   --   --  25* 24* 22*  CREATININE 1.96*   < > 1.82*   < > 1.73*  --  1.74*  --   --   --  1.62* 1.80* 1.66*  CALCIUM 8.5*   < > 7.9*   < > 8.0*  --  7.6*  --   --   --  7.6* 7.5* 7.7*  MG 2.6*  --  2.5*  --   --   --   --   --   --   --  2.1 1.8 1.8  PHOS 6.1*  --  3.6  --   --   --   --   --   --   --  2.8 2.1* 2.9   < > = values in this interval not displayed.   Liver Function Tests: Recent Labs  Lab 12/30/19 0620 12/30/19 0620 01/01/20 0254 01/02/20 0231 01/03/20 0458 01/04/20 0321 01/05/20 0256  AST 109*  --  91* 59* 36 45*  --   ALT 145*  --  122* 86* 67* 64*  --   ALKPHOS 113  --  109 97 96 98  --   BILITOT 1.3*  --  1.0 0.6 0.7 0.7  --   PROT 6.8  --  6.2* 5.7* 5.6* 5.8*  --  ALBUMIN 2.1*   < > 1.8* 1.6* 1.5* 1.5* 1.5*   < > = values in this interval not displayed.   No results for input(s): LIPASE, AMYLASE in the last 168 hours. No results for input(s): AMMONIA in the last 168 hours. CBC: Recent Labs  Lab 12/29/19 1633 12/29/19 2333 01/01/20 0254 01/01/20 0254 01/02/20 0231 01/02/20 1401 01/02/20 1409 01/02/20 1416 01/03/20 0458 01/04/20 0321 01/05/20 0256  WBC 22.1*   < > 16.8*  --  17.0*  --   --   --  14.1* 14.7* 13.9*  NEUTROABS 17.5*  --   --   --   --   --   --   --   --   --   --   HGB 14.6   < > 13.2   < > 11.8*   < > 12.2* 11.6* 11.1* 11.2* 10.9*  HCT 43.3   < > 37.1*   < > 33.3*   < > 36.0* 34.0* 32.1* 32.1* 31.1*  MCV 84.2   < > 81.7  --  81.2  --   --   --  81.5 81.1 79.5*  PLT 132*   < > 142*  --  155  --   --   --  156 206 246   < > = values in this interval not displayed.   Cardiac  Enzymes: No results for input(s): CKTOTAL, CKMB, CKMBINDEX, TROPONINI in the last 168 hours. BNP: Invalid input(s): POCBNP CBG: Recent Labs  Lab 01/04/20 2110  GLUCAP 160*   D-Dimer No results for input(s): DDIMER in the last 72 hours. Hgb A1c No results for input(s): HGBA1C in the last 72 hours. Lipid Profile No results for input(s): CHOL, HDL, LDLCALC, TRIG, CHOLHDL, LDLDIRECT in the last 72 hours. Thyroid function studies No results for input(s): TSH, T4TOTAL, T3FREE, THYROIDAB in the last 72 hours.  Invalid input(s): FREET3 Anemia work up No results for input(s): VITAMINB12, FOLATE, FERRITIN, TIBC, IRON, RETICCTPCT in the last 72 hours. Urinalysis    Component Value Date/Time   COLORURINE YELLOW 12/30/2019 0430   APPEARANCEUR CLEAR 12/30/2019 0430   LABSPEC 1.036 (H) 12/30/2019 0430   PHURINE 5.0 12/30/2019 0430   GLUCOSEU NEGATIVE 12/30/2019 0430   HGBUR SMALL (A) 12/30/2019 0430   BILIRUBINUR NEGATIVE 12/30/2019 0430   BILIRUBINUR neg 01/12/2013 1714   KETONESUR NEGATIVE 12/30/2019 0430   PROTEINUR 30 (A) 12/30/2019 0430   UROBILINOGEN 0.2 01/12/2013 1714   NITRITE NEGATIVE 12/30/2019 0430   LEUKOCYTESUR NEGATIVE 12/30/2019 0430   Sepsis Labs Invalid input(s): PROCALCITONIN,  WBC,  LACTICIDVEN Microbiology Recent Results (from the past 240 hour(s))  Blood culture (routine x 2)     Status: None   Collection Time: 12/29/19 10:21 PM   Specimen: BLOOD  Result Value Ref Range Status   Specimen Description BLOOD SITE NOT SPECIFIED  Final   Special Requests   Final    BOTTLES DRAWN AEROBIC AND ANAEROBIC Blood Culture adequate volume   Culture   Final    NO GROWTH 5 DAYS Performed at [facility], [address] [place], [place] [address]    Report Status 01/03/2020 FINAL  Final  Respiratory Panel by RT PCR (Flu A&B, Covid) - Nasopharyngeal Swab     Status: None   Collection Time: 12/29/19 10:21 PM   Specimen: Nasopharyngeal Swab  Result Value Ref Range  Status   SARS Coronavirus 2 by RT PCR NEGATIVE NEGATIVE Final    Comment: (NOTE) SARS-CoV-2 target nucleic  acids are NOT DETECTED. The SARS-CoV-2 RNA is generally detectable in upper respiratoy specimens during the acute phase of infection. The lowest concentration of SARS-CoV-2 viral copies this assay can detect is 131 copies/mL. A negative result does not preclude SARS-Cov-2 infection and should not be used as the sole basis for treatment or other patient management decisions. A negative result may occur with  improper specimen collection/handling, submission of specimen other than nasopharyngeal swab, presence of viral mutation(s) within the areas targeted by this assay, and inadequate number of viral copies (<131 copies/mL). A negative result must be combined with clinical observations, patient history, and epidemiological information. The expected result is Negative. Fact Sheet for Patients:  PinkCheek.be Fact Sheet for Healthcare Providers:  GravelBags.it This test is not yet ap proved or cleared by the [place] FDA and  has been authorized for detection and/or diagnosis of SARS-CoV-2 by FDA under an Emergency Use Authorization (EUA). This EUA will remain  in effect (meaning this test can be used) for the duration of the COVID-19 declaration under Section 564(b)(1) of the Act, 21 U.S.C. section 360bbb-3(b)(1), unless the authorization is terminated or revoked sooner.    Influenza A by PCR NEGATIVE NEGATIVE Final   Influenza B by PCR NEGATIVE NEGATIVE Final    Comment: (NOTE) The Xpert Xpress SARS-CoV-2/FLU/RSV assay is intended as an aid in  the diagnosis of influenza from Nasopharyngeal swab specimens and  should not be used as a sole basis for treatment. Nasal washings and  aspirates are unacceptable for Xpert Xpress SARS-CoV-2/FLU/RSV  testing. Fact Sheet for  Patients: PinkCheek.be Fact Sheet for Healthcare Providers: GravelBags.it This test is not yet approved or cleared by the [place] FDA and  has been authorized for detection and/or diagnosis of SARS-CoV-2 by  FDA under an Emergency Use Authorization (EUA). This EUA will remain  in effect (meaning this test can be used) for the duration of the  Covid-19 declaration under Section 564(b)(1) of the Act, 21  U.S.C. section 360bbb-3(b)(1), unless the authorization is  terminated or revoked. Performed at [facility], [place] [address] [place], [place] [address]      Time coordinating discharge:39 minutes  SIGNED:   Georgette Shell, MD  [facility] 01/05/2020, 8:58 AM Pager   If 7PM-7AM, please contact night-coverage [email or web address] Password [email or web address]

## 2020-01-05 NOTE — Progress Notes (Signed)
PROGRESS NOTE    Cody Joyce  [id number] DOB: May 17, 1961 DOA: 12/29/2019 PCP: Patient, No Pcp Per    Chief Complaint  Patient presents with  . Shortness of Breath    Brief Narrative: 59 y.o.malewith history of COPD, chronic alcoholism and tobacco abuse who had a tree branch fall on his right rib cage on 4/15, negative chest x-ray but since been less mobile. Patient presented to the ED on4/24/2021from home with complaint of shortness of breath, chest pain. EMS noted oxygen saturation of 60% brought to the ED on NRB.  CT angio chest showed large bilateral pulmonary emboli with evidence of right heart strain and an adherent mural thrombus along the anterior wall of the RV with bilateral groundglass opacities likely developing pulmonary infarctions, underlying emphysema. CT abdomen pelvis showed multiple left renal cortical infarcts. Patient was admitted to critical care on IV heparin drip.  Patient underwentthrombectomy of both pulmonary arteriesby IR on 4/25.Patient subsequently improved and was transferred to hospital service on 4/26.  Patient had brief episode of atrial flutter on 4/27. He was started on IV amiodarone.  R/LHC on 4/28 with mild pulmonary hypertension but normal coronaries, LVEDP and no evidence of left-to-right shunt by oxymetry.   TEE on 4/29 with LVEF of 30 to 35%, global hypokinesis, interventricular septum flattening, severely reduced RVSF and severely enlarged RV and large PFO with bidirectional shunting across the atrial septum but no thrombus.  Cardiology recommended amiodarone 400 mg daily for 2 weeks followed by 200 mg daily.He is also on low-dose Coreg. He was transitioned to p.o. Eliquis for PE/DVT. Cardiology to arrange outpatient follow-ups.  Patient saturation on room air at rest was 92%  Patient saturation on room air while ambulating was 82% patient saturation on 3 L while ambulating 93%.   Assessment & Plan:   Principal  Problem:   Aortic atherosclerosis (HCC) Active Problems:   COPD (chronic obstructive pulmonary disease) (HCC)   Alcohol dependence (Cody Joyce)   Pulmonary embolism (HCC)   Renal infarct (Cody Joyce)   Acute pulmonary embolism with acute cor pulmonale (HCC)   PFO (patent foramen ovale)   Acute systolic CHF (congestive heart failure) (HCC)   Non-STEMI (non-ST elevated myocardial infarction) (HCC)   Acute deep vein thrombosis (DVT) of left lower extremity (HCC)   Acute respiratory failure with hypoxia-multifactorial including PE, new onset systolic CHF and possible NSTEMI.R/LHC and TEE as above.  Discharged on 3 L of oxygen since his sats dropped to 82% on room air with ambulation.  Seen by physical therapy will discharge patient with home PT and oxygen.  Submassive PE with acute cor pulmonale/LLE common femoral and popliteal DVT -likely provoked  Multiple left renal cortical infarcts likely paradoxical embolism-TEE revealed large PFO -Bilateral pulmonary artery suction thrombectomy of both pulmonary arteries by IR on 4/25. -TEE confirms large PFO.  Follow-up with Dr. Burt Knack cardiology. -Transitionedto p.o. Eliquis on 4/30.  NSTEMI: HS Trop>600 (admit)>>>11,000>. R/LHC unrevealing.  -On low-dose Coreg.  New onset systolic CHF: Echo on 123456 with EF of 35 to 40%, global hypokinesis, dilated right heart chamber, moderate range reduced RVEF with signs of pressure and volume overload. BNP>1000. Clinically no significant signs of fluid overload. R/LHC and TEE as above. -No diuretics per cardiology. -Continue low-dose Coreg  New onset atrial flutter with RVR: Converted to sinus rhythm. CHA2DS2-VASc score>3. -Transitioned to p.o. amiodarone.  -Plan for discharge on amiodarone 400 mg daily for 2 weeks followed by 200 mg -Also on low-dose Coreg. On Eliquis for anticoagulation.  AKI/mild  azotemia: baseline Cr normal> 1.96 (admit)>>>1.74> 1.62>1.8. CT abdomen found multiple left renal  cortical infarcts likely due to paradoxical embolism.  Leukocytosis:Improved. Likely demargination.  Elevated liver enzymes: Likely congestive hepatopathy from PE. Alcohol could play role.   Hyperglycemia/prediabetes: A1c 6.4%. -Counsel on lifestyle change including diet and exercise  COPD? -Continue as needed DuoNeb  Alcohol abuse: No withdrawal symptoms.  Tobacco use disorder -Encourage cessation  DVT prophylaxis: On eliquis  Code Status: Full code Family Communication:  Patient Status is: Inpatient Dispo: The patient is from: Home  Anticipated d/c is to: To be determined after therapy evaluation  Anticipated d/c date is: 1 day Patient is medically stable to be discharged over the weekend however case manager not able to arrange for home O2 since the [place] hospital will not accept oxygen orders from [place] and that they need to get the oxygen orders from  [place] MD.   Consultants:  Cardiology IR PCCM  Procedures:  Antimicrobials  Subjective: Patient resting in bed no complaints no events overnight  Objective: Vitals:   01/04/20 1524 01/04/20 2240 01/05/20 0300 01/05/20 0738  BP: 123/83 109/71  117/72  Pulse: 87 83  86  Resp: 20 20  19   Temp: 99.1 F (37.3 C) (!) 100.4 F (38 C)  98 F (36.7 C)  TempSrc:  Oral    SpO2: 92% 96%  94%  Weight:   84.7 kg   Height:        Intake/Output Summary (Last 24 hours) at 01/05/2020 1223 Last data filed at 01/05/2020 0300 Gross per 24 hour  Intake --  Output 350 ml  Net -350 ml   Filed Weights   01/03/20 1138 01/03/20 1141 01/05/20 0300  Weight: 83.9 kg 83.9 kg 84.7 kg    Examination:  General exam: Appears calm and comfortable  Respiratory system: Scattered rhonchi bilaterally to auscultation. Respiratory effort normal. Cardiovascular system: S1 & S2 heard, RRR. No JVD, murmurs, rubs, gallops or clicks. No pedal edema. Gastrointestinal system: Abdomen is nondistended, soft and  nontender. No organomegaly or masses felt. Normal bowel sounds heard. Central nervous system: Alert and oriented. No focal neurological deficits. Extremities: Symmetric 5 x 5 power. Skin: No rashes, lesions or ulcers Psychiatry: Judgement and insight appear normal. Mood & affect appropriate.     Data Reviewed: I have personally reviewed following labs and imaging studies  CBC: Recent Labs  Lab 12/29/19 1633 12/29/19 2333 01/01/20 0254 01/01/20 0254 01/02/20 0231 01/02/20 1401 01/02/20 1409 01/02/20 1416 01/03/20 0458 01/04/20 0321 01/05/20 0256  WBC 22.1*   < > 16.8*  --  17.0*  --   --   --  14.1* 14.7* 13.9*  NEUTROABS 17.5*  --   --   --   --   --   --   --   --   --   --   HGB 14.6   < > 13.2   < > 11.8*   < > 12.2* 11.6* 11.1* 11.2* 10.9*  HCT 43.3   < > 37.1*   < > 33.3*   < > 36.0* 34.0* 32.1* 32.1* 31.1*  MCV 84.2   < > 81.7  --  81.2  --   --   --  81.5 81.1 79.5*  PLT 132*   < > 142*  --  155  --   --   --  156 206 246   < > = values in this interval not displayed.    Basic Metabolic Panel: Recent Labs  Lab 12/30/19 0620 12/30/19 0620 12/31/19 0537 12/31/19 0537 01/01/20 0254 01/01/20 0254 01/02/20 0231 01/02/20 1401 01/02/20 1409 01/02/20 1416 01/03/20 0458 01/04/20 0321 01/05/20 0256  NA 143   < > 141   < > 139   < > 140   < > 145 144 139 135 135  K 5.5*   < > 4.7   < > 4.4   < > 4.3   < > 4.3 4.3 4.2 4.7 4.5  CL 111   < > 111   < > 108  --  110  --   --   --  112* 108 106  CO2 21*   < > 20*   < > 22  --  11*  --   --   --  19* 20* 19*  GLUCOSE 129*   < > 143*   < > 121*  --  136*  --   --   --  117* 156* 131*  BUN 43*   < > 49*   < > 33*  --  33*  --   --   --  25* 24* 22*  CREATININE 1.96*   < > 1.82*   < > 1.73*  --  1.74*  --   --   --  1.62* 1.80* 1.66*  CALCIUM 8.5*   < > 7.9*   < > 8.0*  --  7.6*  --   --   --  7.6* 7.5* 7.7*  MG 2.6*  --  2.5*  --   --   --   --   --   --   --  2.1 1.8 1.8  PHOS 6.1*  --  3.6  --   --   --   --   --   --    --  2.8 2.1* 2.9   < > = values in this interval not displayed.    GFR: Estimated Creatinine Clearance: 52.6 mL/min (A) (by C-G formula based on SCr of 1.66 mg/dL (H)).  Liver Function Tests: Recent Labs  Lab 12/30/19 0620 12/30/19 0620 01/01/20 0254 01/02/20 0231 01/03/20 0458 01/04/20 0321 01/05/20 0256  AST 109*  --  91* 59* 36 45*  --   ALT 145*  --  122* 86* 67* 64*  --   ALKPHOS 113  --  109 97 96 98  --   BILITOT 1.3*  --  1.0 0.6 0.7 0.7  --   PROT 6.8  --  6.2* 5.7* 5.6* 5.8*  --   ALBUMIN 2.1*   < > 1.8* 1.6* 1.5* 1.5* 1.5*   < > = values in this interval not displayed.    CBG: Recent Labs  Lab 01/04/20 2110  GLUCAP 160*     Recent Results (from the past 240 hour(s))  Blood culture (routine x 2)     Status: None   Collection Time: 12/29/19 10:21 PM   Specimen: BLOOD  Result Value Ref Range Status   Specimen Description BLOOD SITE NOT SPECIFIED  Final   Special Requests   Final    BOTTLES DRAWN AEROBIC AND ANAEROBIC Blood Culture adequate volume   Culture   Final    NO GROWTH 5 DAYS Performed at [facility], [address], [place], [place] [address]    Report Status 01/03/2020 FINAL  Final  Respiratory Panel by RT PCR (Flu A&B, Covid) - Nasopharyngeal Swab     Status: None   Collection Time: 12/29/19 10:21  PM   Specimen: Nasopharyngeal Swab  Result Value Ref Range Status   SARS Coronavirus 2 by RT PCR NEGATIVE NEGATIVE Final    Comment: (NOTE) SARS-CoV-2 target nucleic acids are NOT DETECTED. The SARS-CoV-2 RNA is generally detectable in upper respiratoy specimens during the acute phase of infection. The lowest concentration of SARS-CoV-2 viral copies this assay can detect is 131 copies/mL. A negative result does not preclude SARS-Cov-2 infection and should not be used as the sole basis for treatment or other patient management decisions. A negative result may occur with  improper specimen collection/handling, submission of specimen  other than nasopharyngeal swab, presence of viral mutation(s) within the areas targeted by this assay, and inadequate number of viral copies (<131 copies/mL). A negative result must be combined with clinical observations, patient history, and epidemiological information. The expected result is Negative. Fact Sheet for Patients:  PinkCheek.be Fact Sheet for Healthcare Providers:  GravelBags.it This test is not yet ap proved or cleared by the [place] FDA and  has been authorized for detection and/or diagnosis of SARS-CoV-2 by FDA under an Emergency Use Authorization (EUA). This EUA will remain  in effect (meaning this test can be used) for the duration of the COVID-19 declaration under Section 564(b)(1) of the Act, 21 U.S.C. section 360bbb-3(b)(1), unless the authorization is terminated or revoked sooner.    Influenza A by PCR NEGATIVE NEGATIVE Final   Influenza B by PCR NEGATIVE NEGATIVE Final    Comment: (NOTE) The Xpert Xpress SARS-CoV-2/FLU/RSV assay is intended as an aid in  the diagnosis of influenza from Nasopharyngeal swab specimens and  should not be used as a sole basis for treatment. Nasal washings and  aspirates are unacceptable for Xpert Xpress SARS-CoV-2/FLU/RSV  testing. Fact Sheet for Patients: PinkCheek.be Fact Sheet for Healthcare Providers: GravelBags.it This test is not yet approved or cleared by the [place] FDA and  has been authorized for detection and/or diagnosis of SARS-CoV-2 by  FDA under an Emergency Use Authorization (EUA). This EUA will remain  in effect (meaning this test can be used) for the duration of the  Covid-19 declaration under Section 564(b)(1) of the Act, 21  U.S.C. section 360bbb-3(b)(1), unless the authorization is  terminated or revoked. Performed at [facility], [place] [address], [place], [place] [address]           Radiology Studies: ECHO TEE  Result Date: 01/03/2020    TRANSESOPHOGEAL ECHO REPORT   Patient Name:   Cody Joyce [facility] Date of Exam: 01/03/2020 Medical Rec #:  [id number]       Height:       72.0 in Accession #:    [id number]      Weight:       185.0 lb Date of Birth:  Jan 30, 1961        BSA:          2.061 m Patient Age:    69 years        BP:           79/61 mmHg Patient Gender: M               HR:           77 bpm. Exam Location:  Inpatient Procedure: Transesophageal Echo, Cardiac Doppler, Color Doppler, Saline Contrast            Bubble Study and Intracardiac Opacification Agent Indications:     possible atrial septal defect  History:  Patient has prior history of Echocardiogram examinations, most                  recent 12/31/2019. Pulmonary embolus.  Sonographer:     Johny Chess Referring Phys:  [id number] [place] Diagnosing Phys: Ena Dawley MD PROCEDURE: After discussion of the risks and benefits of a TEE, an informed consent was obtained from the patient. The transesophogeal probe was passed without difficulty through the esophogus of the patient. Sedation performed by different physician. The patient was monitored while under deep sedation. Anesthestetic sedation was provided intravenously by Anesthesiology: 172.5mg  of Propofol. The patient's vital signs; including heart rate, blood pressure, and oxygen saturation; remained stable throughout the procedure. The patient developed no complications during the procedure. IMPRESSIONS  1. There is a smoke in the left ventricular apex with no evidence for a thrombus on Definity echo contrast images.. Left ventricular ejection fraction, by estimation, is 30 to 35%. The left ventricle has moderately decreased function. The left ventricle  demonstrates global hypokinesis. There is the interventricular septum is flattened in systole and diastole, consistent with right ventricular pressure and volume overload.  2. Right ventricular  systolic function is severely reduced. The right ventricular size is severely enlarged. There is normal pulmonary artery systolic pressure.  3. Left atrial size was moderately dilated. No left atrial/left atrial appendage thrombus was detected.  4. Right atrial size was moderately dilated.  5. The mitral valve is normal in structure. Mild mitral valve regurgitation. No evidence of mitral stenosis.  6. The aortic valve is normal in structure. Aortic valve regurgitation is not visualized. No aortic stenosis is present.  7. The inferior vena cava is normal in size with greater than 50% respiratory variability, suggesting right atrial pressure of 3 mmHg.  8. Agitated saline contrast bubble study was significantly positive with shunting observed within the first cardiac cycles suggestive of interatrial shunt. There is a large patent foramen ovale with bidirectional shunting across atrial septum. Conclusion(s)/Recommendation(s): Findings are concerning for an interatrial shunt as detailed above. FINDINGS  Left Ventricle: There is a smoke in the left ventricular apex with no evidence for a thrombus on Definity echo contrast images. Left ventricular ejection fraction, by estimation, is 30 to 35%. The left ventricle has moderately decreased function. The left ventricle demonstrates global hypokinesis. Definity contrast agent was given IV to delineate the left ventricular endocardial borders. The left ventricular internal cavity size was normal in size. There is no left ventricular hypertrophy. The interventricular septum is flattened in systole and diastole, consistent with right ventricular pressure and volume overload. Right Ventricle: The right ventricular size is severely enlarged. No increase in right ventricular wall thickness. Right ventricular systolic function is severely reduced. There is normal pulmonary artery systolic pressure. The tricuspid regurgitant velocity is 2.41 m/s, and with an assumed right atrial  pressure of 10 mmHg, the estimated right ventricular systolic pressure is [id number] mmHg. Left Atrium: Left atrial size was moderately dilated. No left atrial/left atrial appendage thrombus was detected. Right Atrium: Right atrial size was moderately dilated. Pericardium: There is no evidence of pericardial effusion. Mitral Valve: The mitral valve is normal in structure. Normal mobility of the mitral valve leaflets. Mild mitral valve regurgitation. No evidence of mitral valve stenosis. Tricuspid Valve: The tricuspid valve is normal in structure. Tricuspid valve regurgitation is mild . No evidence of tricuspid stenosis. Aortic Valve: The aortic valve is normal in structure. Aortic valve regurgitation is not visualized. No aortic stenosis is present. Pulmonic Valve:  The pulmonic valve was normal in structure. Pulmonic valve regurgitation is not visualized. No evidence of pulmonic stenosis. Aorta: The aortic root is normal in size and structure. Venous: The inferior vena cava is normal in size with greater than 50% respiratory variability, suggesting right atrial pressure of 3 mmHg. IAS/Shunts: No atrial level shunt detected by color flow Doppler. Agitated saline contrast was given intravenously to evaluate for intracardiac shunting. Agitated saline contrast bubble study was positive with shunting observed within 3-6 cardiac cycles suggestive of interatrial shunt. A large patent foramen ovale is detected with bidirectional shunting across atrial septum. There is a atrial septal defect with predominantly left to right shunting across the atrial septum.  TRICUSPID VALVE TR Peak grad:   23.2 mmHg TR Vmax:        241.00 cm/s Ena Dawley MD Electronically signed by Ena Dawley MD Signature Date/Time: 01/03/2020/2:16:17 PM    Final         Scheduled Meds: . amiodarone  400 mg Oral Daily  . apixaban   Does not apply Once  . apixaban  10 mg Oral BID   Followed by  . [START ON 01/10/2020] apixaban  5 mg Oral BID  .  carvedilol  3.125 mg Oral BID WC  . pantoprazole  40 mg Oral Daily  . sodium chloride flush  3 mL Intravenous Q12H   Continuous Infusions: . sodium chloride 50 mL/hr at 01/02/20 1732  . sodium chloride       LOS: 7 days     Georgette Shell, MD [facility]   To contact the attending provider between 7A-7P or the covering provider during after hours 7P-7A, please log into the web site [email or web address] and access using universal [place] password for that web site. If you do not have the password, please call the hospital operator.  01/05/2020, 12:23 PM

## 2020-01-05 NOTE — Progress Notes (Signed)
Patient will be discharged home on Monday after oxygen is delivered for home use.  Patient's discharge medications were delivered from the pharmacy yesterday(01-04-20) and his wife has taken them home.  Eliquis literature and coupon was delivered to patient this afternoon.  His wife is taking it home.

## 2020-01-05 NOTE — Discharge Instructions (Addendum)
Acute Respiratory Failure, Adult  Acute respiratory failure occurs when there is not enough oxygen passing from your lungs to your body. When this happens, your lungs have trouble removing carbon dioxide from the blood. This causes your blood oxygen level to drop too low as carbon dioxide builds up. Acute respiratory failure is a medical emergency. It can develop quickly, but it is temporary if treated promptly. Your lung capacity, or how much air your lungs can hold, may improve with time, exercise, and treatment. What are the causes? There are many possible causes of acute respiratory failure, including:  Lung injury.  Chest injury or damage to the ribs or tissues near the lungs.  Lung conditions that affect the flow of air and blood into and out of the lungs, such as pneumonia, acute respiratory distress syndrome, and cystic fibrosis.  Medical conditions, such as strokes or spinal cord injuries, that affect the muscles and nerves that control breathing.  Blood infection (sepsis).  Inflammation of the pancreas (pancreatitis).  A blood clot in the lungs (pulmonary embolism).  A large-volume blood transfusion.  Burns.  Near-drowning.  Seizure.  Smoke inhalation.  Reaction to medicines.  Alcohol or drug overdose. What increases the risk? This condition is more likely to develop in people who have:  A blocked airway.  Asthma.  A condition or disease that damages or weakens the muscles, nerves, bones, or tissues that are involved in breathing.  A serious infection.  A health problem that blocks the unconscious reflex that is involved in breathing, such as hypothyroidism or sleep apnea.  A lung injury or trauma. What are the signs or symptoms? Trouble breathing is the main symptom of acute respiratory failure. Symptoms may also include:  Rapid breathing.  Restlessness or anxiety.  Skin, lips, or fingernails that appear blue (cyanosis).  Rapid heart  rate.  Abnormal heart rhythms (arrhythmias).  Confusion or changes in behavior.  Tiredness or loss of energy.  Feeling sleepy or having a loss of consciousness. How is this diagnosed? Your health care provider can diagnose acute respiratory failure with a medical history and physical exam. During the exam, your health care provider will listen to your heart and check for crackling or wheezing sounds in your lungs. Your may also have tests to confirm the diagnosis and determine what is causing respiratory failure. These tests may include:  Measuring the amount of oxygen in your blood (pulse oximetry). The measurement comes from a small device that is placed on your finger, earlobe, or toe.  Other blood tests to measure blood gases and to look for signs of infection.  Sampling your cerebral spinal fluid or tracheal fluid to check for infections.  Chest X-ray to look for fluid in spaces that should be filled with air.  Electrocardiogram (ECG) to look at the heart's electrical activity. How is this treated? Treatment for this condition usually takes places in a hospital intensive care unit (ICU). Treatment depends on what is causing the condition. It may include one or more treatments until your symptoms improve. Treatment may include:  Supplemental oxygen. Extra oxygen is given through a tube in the nose, a face mask, or a hood.  A device such as a continuous positive airway pressure (CPAP) or bi-level positive airway pressure (BiPAP or BPAP) machine. This treatment uses mild air pressure to keep the airways open. A mask or other device will be placed over your nose or mouth. A tube that is connected to a motor will deliver oxygen through  the mask.  Ventilator. This treatment helps move air into and out of the lungs. This may be done with a bag and mask or a machine. For this treatment, a tube is placed in your windpipe (trachea) so air and oxygen can flow to the lungs.  Extracorporeal  membrane oxygenation (ECMO). This treatment temporarily takes over the function of the heart and lungs, supplying oxygen and removing carbon dioxide. ECMO gives the lungs a chance to recover. It may be used if a ventilator is not effective.  Tracheostomy. This is a procedure that creates a hole in the neck to insert a breathing tube.  Receiving fluids and medicines.  Rocking the bed to help breathing. Follow these instructions at home:  Take over-the-counter and prescription medicines only as told by your health care provider.  Return to normal activities as told by your health care provider. Ask your health care provider what activities are safe for you.  Keep all follow-up visits as told by your health care provider. This is important. How is this prevented? Treating infections and medical conditions that may lead to acute respiratory failure can help prevent the condition from developing. Contact a health care provider if:  You have a fever.  Your symptoms do not improve or they get worse. Get help right away if:  You are having trouble breathing.  You lose consciousness.  Your have cyanosis or turn blue.  You develop a rapid heart rate.  You are confused. These symptoms may represent a serious problem that is an emergency. Do not wait to see if the symptoms will go away. Get medical help right away. Call your local emergency services (911 in the [place] Do not drive yourself to the hospital. This information is not intended to replace advice given to you by your health care provider. Make sure you discuss any questions you have with your health care provider. Document Revised: 08/05/2017 Document Reviewed: 03/10/2016 Elsevier Patient Education  [place] on my medicine - ELIQUIS (apixaban)  Why was Eliquis prescribed for you? Eliquis was prescribed to treat blood clots that may have been found in the veins of your legs (deep vein thrombosis) or in  your lungs (pulmonary embolism) and to reduce the risk of them occurring again.  What do You need to know about Eliquis ? The starting dose is 10 mg (two 5 mg tablets) taken TWICE daily for the FIRST SEVEN (7) DAYS, then on 01/10/2020 in PM  the dose is reduced to ONE 5 mg tablet taken TWICE daily.  Eliquis may be taken with or without food.   Try to take the dose about the same time in the morning and in the evening. If you have difficulty swallowing the tablet whole please discuss with your pharmacist how to take the medication safely.  Take Eliquis exactly as prescribed and DO NOT stop taking Eliquis without talking to the doctor who prescribed the medication.  Stopping may increase your risk of developing a new blood clot.  Refill your prescription before you run out.  After discharge, you should have regular check-up appointments with your healthcare provider that is prescribing your Eliquis.    What do you do if you miss a dose? If a dose of ELIQUIS is not taken at the scheduled time, take it as soon as possible on the same day and twice-daily administration should be resumed. The dose should not be doubled to make up for a missed dose.  Important Safety Information A possible  side effect of Eliquis is bleeding. You should call your healthcare provider right away if you experience any of the following: ? Bleeding from an injury or your nose that does not stop. ? Unusual colored urine (red or dark brown) or unusual colored stools (red or black). ? Unusual bruising for unknown reasons. ? A serious fall or if you hit your head (even if there is no bleeding).  Some medicines may interact with Eliquis and might increase your risk of bleeding or clotting while on Eliquis. To help avoid this, consult your healthcare provider or pharmacist prior to using any new prescription or non-prescription medications, including herbals, vitamins, non-steroidal anti-inflammatory drugs (NSAIDs) and  supplements.  This website has more information on Eliquis (apixaban): http://www.eliquis.com/eliquis/home

## 2020-01-05 NOTE — Care Management (Addendum)
[place] DME provider for [employer], they will not accept order for home oxygen through [place], it needs to come directly from [place] [facility]. Neither [employer], [employer], [employer], or [employer] could assist, as they are not contracted with the [place]. Notified MD that home oxygen will need to be set up Monday when [place] offices reopen. Confirmed w leadership on call, no other resources available. Patient cannot DC over weekend.  Avoidable days entered.

## 2020-01-06 ENCOUNTER — Inpatient Hospital Stay (HOSPITAL_COMMUNITY): Payer: No Typology Code available for payment source

## 2020-01-06 LAB — CBC
HCT: 31.1 % — ABNORMAL LOW (ref 39.0–52.0)
Hemoglobin: 10.9 g/dL — ABNORMAL LOW (ref 13.0–17.0)
MCH: 28.2 pg (ref 26.0–34.0)
MCHC: 35 g/dL (ref 30.0–36.0)
MCV: 80.4 fL (ref 80.0–100.0)
Platelets: 287 10*3/uL (ref 150–400)
RBC: 3.87 MIL/uL — ABNORMAL LOW (ref 4.22–5.81)
RDW: 15.9 % — ABNORMAL HIGH (ref 11.5–15.5)
WBC: 14.8 10*3/uL — ABNORMAL HIGH (ref 4.0–10.5)
nRBC: 0.7 % — ABNORMAL HIGH (ref 0.0–0.2)

## 2020-01-06 LAB — BRAIN NATRIURETIC PEPTIDE: B Natriuretic Peptide: 1710.4 pg/mL — ABNORMAL HIGH (ref 0.0–100.0)

## 2020-01-06 LAB — MAGNESIUM: Magnesium: 1.8 mg/dL (ref 1.7–2.4)

## 2020-01-06 MED ORDER — FUROSEMIDE 10 MG/ML IJ SOLN
40.0000 mg | Freq: Two times a day (BID) | INTRAMUSCULAR | Status: AC
  Administered 2020-01-06 – 2020-01-08 (×4): 40 mg via INTRAVENOUS
  Filled 2020-01-06 (×4): qty 4

## 2020-01-06 NOTE — Progress Notes (Signed)
Cody Joyce Kitchen  PROGRESS NOTE    Cody Joyce  [id number] DOB: 10-19-1960 DOA: 12/29/2019 PCP: Patient, No Pcp Per    Chief Complaint  Patient presents with  . Shortness of Breath    Brief Narrative:  59 y.o.malewith history of COPD, chronic alcoholism and tobacco abuse who had a tree branch fall on his right rib cage on 4/15, negative chest x-ray but since been less mobile. Patient presented to the ED on4/24/2021from home with complaint of shortness of breath, chest pain. EMS noted oxygen saturation of 60% brought to the ED on NRB.  CT angio chest showed large bilateral pulmonary emboli with evidence of right heart strain and an adherent mural thrombus along the anterior wall of the RV with bilateral groundglass opacities likely developing pulmonary infarctions, underlying emphysema. CT abdomen pelvis showed multiple left renal cortical infarcts. Patient was admitted to critical care on IV heparin drip.  Patient underwentthrombectomy of both pulmonary arteriesby IR on 4/25.Patient subsequently improved and was transferred to hospital service on 4/26.  Patient had brief episode of atrial flutter on 4/27. He was started on IV amiodarone.  R/LHC on 4/28 with mild pulmonary hypertension but normal coronaries, LVEDP and no evidence of left-to-right shunt by oxymetry.   TEE on 4/29 with LVEF of 30 to 35%, global hypokinesis, interventricular septum flattening, severely reduced RVSF and severely enlarged RV and large PFO with bidirectional shunting across the atrial septum but no thrombus.  Cardiology recommended amiodarone 400 mg daily for 2 weeks followed by 200 mg daily.He is also on low-dose Coreg. He was transitioned to p.o. Eliquis for PE/DVT. Cardiology to arrange outpatient follow-ups.  Patient saturation on room air at rest was 92%  Patient saturation on room air while ambulating was 82% patient saturation on 3 L while ambulating 93%.    Assessment & Plan:    Principal Problem:   Aortic atherosclerosis (HCC) Active Problems:   COPD (chronic obstructive pulmonary disease) (HCC)   Alcohol dependence ([place])   Pulmonary embolism (HCC)   Renal infarct ([place])   Acute pulmonary embolism with acute cor pulmonale (HCC)   PFO (patent foramen ovale)   Acute systolic CHF (congestive heart failure) (HCC)   Non-STEMI (non-ST elevated myocardial infarction) (HCC)   Acute deep vein thrombosis (DVT) of left lower extremity (HCC)   Acute respiratory failure with hypoxia-multifactorial including PE, new onset systolic CHF and possible NSTEMI.Patient was not able to be discharged yesterday as his oxygen will not be available from [place] till Monday.  However his oxygen requirement increased significantly overnight. Chest x-ray done 01/06/2020 shows slight interval worsening moderate hazy airspace process over the central lung suggesting moderate interstitial edema and less likely infection.  Submassive PE with acute cor pulmonale/LLE common femoral and popliteal DVT-likely provoked  Multiple left renal cortical infarcts likely paradoxical embolism-TEE revealed large PFO -Bilateral pulmonary artery suction thrombectomy of both pulmonary arteries by IR on 4/25. -TEE confirms large PFO.Follow-up with Dr. Burt Knack cardiology. -Transitionedto p.o. Eliquis on 4/30.  NSTEMI: HS Trop>600 (admit)>>>11,000>. R/LHC unrevealing.  -On low-dose Coreg.  New onset systolic CHF: Echo on [id number] with EF of 35 to 40%, global hypokinesis, dilated right heart chamber, moderate range reduced RVEF with signs of pressure and volume overload. BNP>1000. Clinically no significant signs of fluid overload. R/LHC and TEE as above. Cardiology recommended no diuretics however chest x-ray today shows pulmonary edema with increasing oxygen requirement.  We will treat him with Lasix 40 mg twice a day for 4 doses and reassess tomorrow to  see if he needs any further treatment.--Continue  low-dose Coreg  New onset atrial flutter with RVR: Converted to sinus rhythm. CHA2DS2-VASc score>3. -Transitioned to p.o. amiodarone.  -Plan for discharge on amiodarone 400 mg daily for 2 weeks followed by 200 mg -Also on low-dose Coreg. On Eliquis for anticoagulation.  AKI/mild azotemia: baseline Cr normal> 1.96 (admit)>>>1.74> 1.62>1.8. CT abdomen found multiple left renal cortical infarcts likely due to paradoxical embolism.  Leukocytosis:Improved. Likely demargination.  Elevated liver enzymes: Likely congestive hepatopathy from PE. Alcohol could play role.   Hyperglycemia/prediabetes: A1c 6.4%. -Counsel on lifestyle change including diet and exercise  COPD? -Continue as needed [place]  Alcohol abuse: No withdrawal symptoms.  Tobacco use disorder -Encourage cessation  DVT prophylaxis:On eliquis  Code Status:Full code Family Communication: Patient Status is: Inpatient Dispo: The patient is from: Home Anticipated d/c is to: To be determined after therapy evaluation Anticipated d/c date is: 1 day Patient is medically stable to be discharged over the weekend however case manager not able to arrange for home O2 since the [place] hospital will not accept oxygen orders from [place] and that they need to get the oxygen orders from  [place] MD.   Consultants: Cardiology IR PCCM  Procedures:     Subjective:  Patient is resting in bed he feels his breathing is worse than yesterday  Overnight patient with increasing oxygen requirement up to 15 L. He does not appear to be in any distress. Negative by 5600 L. Objective: Vitals:   01/06/20 0500 01/06/20 0722 01/06/20 0827 01/06/20 1233  BP:   107/68 123/87  Pulse:   (!) 111 (!) 115  Resp:   18 18  Temp:   98.5 F (36.9 C) 99 F (37.2 C)  TempSrc:   Oral   SpO2:  95% 100% 98%  Weight: 84.3 kg     Height:        Intake/Output Summary (Last 24 hours) at 01/06/2020 1237 Last  data filed at 01/05/2020 1300 Gross per 24 hour  Intake --  Output 600 ml  Net -600 ml   Filed Weights   01/03/20 1141 01/05/20 0300 01/06/20 0500  Weight: 83.9 kg 84.7 kg 84.3 kg    Examination:  General exam: Appears calm and comfortable  Respiratory system: Crackles at bilateral bases to auscultation. Respiratory effort normal. Cardiovascular system: S1 & S2 heard, RRR. No JVD, murmurs, rubs, gallops or clicks. No pedal edema. Gastrointestinal system: Abdomen is nondistended, soft and nontender. No organomegaly or masses felt. Normal bowel sounds heard. Central nervous system: Alert and oriented. No focal neurological deficits. Extremities: No edema r. Skin: No rashes, lesions or ulcers Psychiatry: Judgement and insight appear normal. Mood & affect appropriate.     Data Reviewed: I have personally reviewed following labs and imaging studies  CBC: Recent Labs  Lab 01/02/20 0231 01/02/20 1401 01/02/20 1416 01/03/20 0458 01/04/20 0321 01/05/20 0256 01/06/20 0453  WBC 17.0*  --   --  14.1* 14.7* 13.9* 14.8*  HGB 11.8*   < > 11.6* 11.1* 11.2* 10.9* 10.9*  HCT 33.3*   < > 34.0* 32.1* 32.1* 31.1* 31.1*  MCV 81.2  --   --  81.5 81.1 79.5* 80.4  PLT 155  --   --  156 206 246 287   < > = values in this interval not displayed.    Basic Metabolic Panel: Recent Labs  Lab 12/31/19 0537 12/31/19 0537 01/01/20 0254 01/01/20 0254 01/02/20 0231 01/02/20 1401 01/02/20 1409 01/02/20 1416 01/03/20 0458 01/04/20 0321  01/05/20 0256 01/06/20 0453  NA 141   < > 139   < > 140   < > 145 144 139 135 135  --   K 4.7   < > 4.4   < > 4.3   < > 4.3 4.3 4.2 4.7 4.5  --   CL 111   < > 108  --  110  --   --   --  112* 108 106  --   CO2 20*   < > 22  --  11*  --   --   --  19* 20* 19*  --   GLUCOSE 143*   < > 121*  --  136*  --   --   --  117* 156* 131*  --   BUN 49*   < > 33*  --  33*  --   --   --  25* 24* 22*  --   CREATININE 1.82*   < > 1.73*  --  1.74*  --   --   --  1.62* 1.80*  1.66*  --   CALCIUM 7.9*   < > 8.0*  --  7.6*  --   --   --  7.6* 7.5* 7.7*  --   MG 2.5*  --   --   --   --   --   --   --  2.1 1.8 1.8 1.8  PHOS 3.6  --   --   --   --   --   --   --  2.8 2.1* 2.9  --    < > = values in this interval not displayed.    GFR: Estimated Creatinine Clearance: 52.6 mL/min (A) (by C-G formula based on SCr of 1.66 mg/dL (H)).  Liver Function Tests: Recent Labs  Lab 01/01/20 0254 01/02/20 0231 01/03/20 0458 01/04/20 0321 01/05/20 0256  AST 91* 59* 36 45*  --   ALT 122* 86* 67* 64*  --   ALKPHOS 109 97 96 98  --   BILITOT 1.0 0.6 0.7 0.7  --   PROT 6.2* 5.7* 5.6* 5.8*  --   ALBUMIN 1.8* 1.6* 1.5* 1.5* 1.5*    CBG: Recent Labs  Lab 01/04/20 2110  GLUCAP 160*     Recent Results (from the past 240 hour(s))  Blood culture (routine x 2)     Status: None   Collection Time: 12/29/19 10:21 PM   Specimen: BLOOD  Result Value Ref Range Status   Specimen Description BLOOD SITE NOT SPECIFIED  Final   Special Requests   Final    BOTTLES DRAWN AEROBIC AND ANAEROBIC Blood Culture adequate volume   Culture   Final    NO GROWTH 5 DAYS Performed at [facility], [address], [place], [place] [address]    Report Status 01/03/2020 FINAL  Final  Respiratory Panel by RT PCR (Flu A&B, Covid) - Nasopharyngeal Swab     Status: None   Collection Time: 12/29/19 10:21 PM   Specimen: Nasopharyngeal Swab  Result Value Ref Range Status   SARS Coronavirus 2 by RT PCR NEGATIVE NEGATIVE Final    Comment: (NOTE) SARS-CoV-2 target nucleic acids are NOT DETECTED. The SARS-CoV-2 RNA is generally detectable in upper respiratoy specimens during the acute phase of infection. The lowest concentration of SARS-CoV-2 viral copies this assay can detect is 131 copies/mL. A negative result does not preclude SARS-Cov-2 infection and should not be used as the sole basis for  treatment or other patient management decisions. A negative result may occur with  improper  specimen collection/handling, submission of specimen other than nasopharyngeal swab, presence of viral mutation(s) within the areas targeted by this assay, and inadequate number of viral copies (<131 copies/mL). A negative result must be combined with clinical observations, patient history, and epidemiological information. The expected result is Negative. Fact Sheet for Patients:  PinkCheek.be Fact Sheet for Healthcare Providers:  GravelBags.it This test is not yet ap proved or cleared by the [place] FDA and  has been authorized for detection and/or diagnosis of SARS-CoV-2 by FDA under an Emergency Use Authorization (EUA). This EUA will remain  in effect (meaning this test can be used) for the duration of the COVID-19 declaration under Section 564(b)(1) of the Act, 21 U.S.C. section 360bbb-3(b)(1), unless the authorization is terminated or revoked sooner.    Influenza A by PCR NEGATIVE NEGATIVE Final   Influenza B by PCR NEGATIVE NEGATIVE Final    Comment: (NOTE) The Xpert Xpress SARS-CoV-2/FLU/RSV assay is intended as an aid in  the diagnosis of influenza from Nasopharyngeal swab specimens and  should not be used as a sole basis for treatment. Nasal washings and  aspirates are unacceptable for Xpert Xpress SARS-CoV-2/FLU/RSV  testing. Fact Sheet for Patients: PinkCheek.be Fact Sheet for Healthcare Providers: GravelBags.it This test is not yet approved or cleared by the [place] FDA and  has been authorized for detection and/or diagnosis of SARS-CoV-2 by  FDA under an Emergency Use Authorization (EUA). This EUA will remain  in effect (meaning this test can be used) for the duration of the  Covid-19 declaration under Section 564(b)(1) of the Act, 21  U.S.C. section 360bbb-3(b)(1), unless the authorization is  terminated or revoked. Performed at [facility], [place] [address] [place], [place] [address]          Radiology Studies: DG Chest 1 View  Result Date: 01/06/2020 CLINICAL DATA:  Post pulmonary arterial thrombectomy 12/30/2019. Shortness of breath. EXAM: CHEST  1 VIEW COMPARISON:  01/06/2020 FINDINGS: Lungs are adequately inflated demonstrate moderate hazy airspace opacification over the central lungs with possible slight interval worsening over the right mid to upper lung. Findings may be due to moderate interstitial edema versus infection. No effusion. Stable cardiomegaly. Remainder of the exam is unchanged. IMPRESSION: Slight interval worsening moderate hazy airspace process over the central lungs suggesting moderate interstitial edema and less likely infection. Electronically Signed   By: Marin Olp M.D.   On: 01/06/2020 09:13   DG CHEST PORT 1 VIEW  Result Date: 01/06/2020 CLINICAL DATA:  Tachypnea EXAM: PORTABLE CHEST 1 VIEW COMPARISON:  12/29/2019 FINDINGS: Mild bilateral interstitial pulmonary edema. No pleural effusion or pneumothorax. No focal airspace consolidation. Mild cardiomegaly. IMPRESSION: Mild interstitial pulmonary edema. Electronically Signed   By: Ulyses Jarred M.D.   On: 01/06/2020 01:55        Scheduled Meds: . amiodarone  400 mg Oral Daily  . apixaban   Does not apply Once  . apixaban  10 mg Oral BID   Followed by  . [START ON 01/10/2020] apixaban  5 mg Oral BID  . carvedilol  3.125 mg Oral BID WC  . pantoprazole  40 mg Oral Daily  . sodium chloride flush  3 mL Intravenous Q12H   Continuous Infusions: . sodium chloride 50 mL/hr at 01/02/20 1732  . sodium chloride       LOS: 8 days     Georgette Shell, MD [facility]  To contact the attending provider between 7A-7P or the covering provider during after hours 7P-7A, please log into the web site [email or web address] and access using universal [place] password for that web site. If you do not have the password, please call the  hospital operator.  01/06/2020, 12:37 PM

## 2020-01-06 NOTE — Plan of Care (Signed)
  Problem: Education: Goal: Knowledge of General Education information will improve Description: Including pain rating scale, medication(s)/side effects and non-pharmacologic comfort measures 01/06/2020 1811 by Jilda Roche, RN Outcome: Progressing 01/06/2020 [place] by Jilda Roche, RN Outcome: Progressing

## 2020-01-06 NOTE — Progress Notes (Signed)
RT placed pt on Heated HFNC 20L @ 100%. Sats are at 94%. Will monitor pt oxygenation needs throughout the night.

## 2020-01-06 NOTE — Progress Notes (Signed)
Rt called to bedside with nurse. Patient saturations fluctuating between 85-94% on 7 liters. Gradually the sats began decreasing and would not increase on 7 L. RT increased the liter flow to 10 and patient saturations are still dropping. RT increased the liter flow to 15 liters and sats are at 90% not stable. RT called Hospital physician to discuss pt status and oxygenation needs. Hospital physician set oxygen goal at 90% and if needed, place pt on Bipap.

## 2020-01-07 DIAGNOSIS — I2601 Septic pulmonary embolism with acute cor pulmonale: Secondary | ICD-10-CM | POA: Diagnosis not present

## 2020-01-07 DIAGNOSIS — I5021 Acute systolic (congestive) heart failure: Secondary | ICD-10-CM | POA: Diagnosis not present

## 2020-01-07 DIAGNOSIS — Q211 Atrial septal defect: Secondary | ICD-10-CM

## 2020-01-07 LAB — CBC
HCT: 33.6 % — ABNORMAL LOW (ref 39.0–52.0)
Hemoglobin: 11.6 g/dL — ABNORMAL LOW (ref 13.0–17.0)
MCH: 27.6 pg (ref 26.0–34.0)
MCHC: 34.5 g/dL (ref 30.0–36.0)
MCV: 79.8 fL — ABNORMAL LOW (ref 80.0–100.0)
Platelets: 339 10*3/uL (ref 150–400)
RBC: 4.21 MIL/uL — ABNORMAL LOW (ref 4.22–5.81)
RDW: 15.6 % — ABNORMAL HIGH (ref 11.5–15.5)
WBC: 13.6 10*3/uL — ABNORMAL HIGH (ref 4.0–10.5)
nRBC: 1.3 % — ABNORMAL HIGH (ref 0.0–0.2)

## 2020-01-07 LAB — RENAL FUNCTION PANEL
Albumin: 1.5 g/dL — ABNORMAL LOW (ref 3.5–5.0)
Anion gap: 9 (ref 5–15)
BUN: 17 mg/dL (ref 6–20)
CO2: 23 mmol/L (ref 22–32)
Calcium: 7.8 mg/dL — ABNORMAL LOW (ref 8.9–10.3)
Chloride: 103 mmol/L (ref 98–111)
Creatinine, Ser: 1.49 mg/dL — ABNORMAL HIGH (ref 0.61–1.24)
GFR calc Af Amer: 59 mL/min — ABNORMAL LOW (ref >60)
GFR calc non Af Amer: 51 mL/min — ABNORMAL LOW (ref >60)
Glucose, Bld: 139 mg/dL — ABNORMAL HIGH (ref 70–99)
Phosphorus: 2.7 mg/dL (ref 2.5–4.6)
Potassium: 3.9 mmol/L (ref 3.5–5.1)
Sodium: 135 mmol/L (ref 135–145)

## 2020-01-07 LAB — MAGNESIUM: Magnesium: 1.7 mg/dL (ref 1.7–2.4)

## 2020-01-07 MED ORDER — METOPROLOL SUCCINATE ER 50 MG PO TB24
50.0000 mg | ORAL_TABLET | Freq: Every day | ORAL | Status: DC
  Administered 2020-01-07 – 2020-01-08 (×2): 50 mg via ORAL
  Filled 2020-01-07 (×2): qty 1

## 2020-01-07 NOTE — Progress Notes (Signed)
PROGRESS NOTE  KIERNAN Joyce [id number] DOB: November 12, 1960   PCP: Patient, No Pcp Per  Patient is from: home  DOA: 12/29/2019 LOS: 9  Brief Narrative / Interim history: 59 y.o. male with history of COPD, chronic alcoholism and tobacco abuse who had a tree branch fall on his right rib cage on 4/15, negative chest x-ray but since been less mobile. Patient presented to the ED on 12/29/2019 from home with complaint of shortness of breath, chest pain.  EMS noted oxygen saturation of 60% brought to the ED on NRB.  CT angio chest showed large bilateral pulmonary emboli with evidence of right heart strain and an adherent mural thrombus along the anterior wall of the RV with bilateral groundglass opacities likely developing pulmonary infarctions, underlying emphysema. CT abdomen pelvis showed multiple left renal cortical infarcts. Patient was admitted to critical care on IV heparin drip.  Patient underwent thrombectomy of both pulmonary arteries by IR on 4/25.Patient subsequently improved and was transferred to hospital service on 4/26.  Patient had brief episode of atrial flutter on 4/27.  He was started on IV amiodarone.   R/LHC on 4/28 with mild pulmonary hypertension but normal coronaries, LVEDP and no evidence of left-to-right shunt by oxymetry.   TEE on 4/29 with LVEF of 30 to 35%, global hypokinesis, interventricular septum flattening, severely reduced RVSF and severely enlarged RV and large PFO with bidirectional shunting across the atrial septum but no thrombus.  Cardiology recommended amiodarone 400 mg daily for 2 weeks followed by 200 mg daily.  He is also on low-dose Coreg.  He was transitioned to p.o. Eliquis for PE/DVT.  Cardiology to arrange outpatient follow-ups.  Patient has interval development of pulmonary edema with increased oxygen requirement.  Started on IV Lasix on 5/2.  Cardiology was consulted on 5/3.   Subjective: Seen and examined earlier this morning.  No major  events overnight of this morning.  Remains on 20 L/50% by HFNC.  Denies chest pain, dyspnea, GI or UTI symptoms.  Objective: Vitals:   01/07/20 0340 01/07/20 0738 01/07/20 0825 01/07/20 1230  BP:  113/72    Pulse: 98 64  (!) 113  Resp: (!) 23     Temp:  98.3 F (36.8 C)    TempSrc:  Oral    SpO2: 95% 97% 92%   Weight:      Height:        Intake/Output Summary (Last 24 hours) at 01/07/2020 1247 Last data filed at 01/07/2020 0800 Gross per 24 hour  Intake 2400 ml  Output 3550 ml  Net -1150 ml   Filed Weights   01/03/20 1141 01/05/20 0300 01/06/20 0500  Weight: 83.9 kg 84.7 kg 84.3 kg    Examination:  GENERAL: No apparent distress.  Nontoxic. HEENT: MMM.  Vision and hearing grossly intact.  NECK: Supple.  No apparent JVD.  RESP: 90% on 20 L / 50% FiO2.  No IWOB.  Fair aeration bilaterally. CVS:  RRR. Heart sounds normal.  ABD/GI/GU: Bowel sounds present. Soft. Non tender.  MSK/EXT:  Moves extremities. No apparent deformity.  Trace edema bilaterally. SKIN: no apparent skin lesion or wound NEURO: Awake, alert and oriented appropriately.  No apparent focal neuro deficit. PSYCH: Calm. Normal affect.   Procedures:  4/25-thrombectomy of both pulmonary arteries by IR. 4/28-R/LHC with normal coronaries and mildly elevated pulmonary artery pressures 4/29-TEE with LVEF of 30 to 35%, global hypokinesis, interventricular septum flattening, severely reduced RVSF and severely enlarged RV, large PFO with bidirectional shunting across the  atrial septum but no thrombus    Microbiology summarized: 4/24-COVID-19 PCR negative. 4/24-influenza PCR negative. 4/24-blood cultures negative.  Assessment & Plan: Acute respiratory failure with hypoxia-multifactorial including PE, PFO shunting, new onset systolic CHF, a flutter and possible NSTEMI.   R/LHC and TEE as above.  CXR concerning for pulmonary interstitial edema. -Treat treatable causes as below. -Wean oxygen as able -Encourage  incentive spirometry -PT/OT evaluation.  New acute combined CHF: Echo on 4/25 with EF of 35 to 40%, global hypokinesis, dilated right heart chamber, moderate range reduced RVEF with signs of pressure and volume overload.  BNP> 1000.  Clinically no significant signs of fluid overload.  R/LHC and TEE as above.  CXR with pulmonary interstitial edema.  Started on IV Lasix.  UOP 3.6 L / 24 hours.  Net -7 L so far -Appreciate cardiology input-continue IV Lasix with a plan to transition to p.o. on 5/4 -Cardiology changed to metoprolol XL -Monitor fluid status, renal function and electrolytes.  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 -TEE confirms large PFO.  Cardiology to arrange outpatient follow-up with the structural heart team -Transitioned to p.o. Eliquis on 4/30.  New onset atrial flutter with RVR: Converted to sinus rhythm.  CHA2DS2-VASc score> 3. -Plan for discharge on amiodarone 400 mg daily for 2 weeks followed by 200 mg -Now on metoprolol XL for better rate control -On Eliquis for anticoagulation  NSTEMI: HS Trop> 600 (admit)>>> 11,000>.  R/LHC unrevealing.  Chest pain-free now. -On low-dose Coreg.  AKI/mild azotemia: baseline Cr normal> 1.96 (admit)>>>1.74> 1.62>1.8>> 1.49. CT abdomen found multiple left renal cortical infarcts likely due to paradoxical embolism. -Continue to monitor. -Avoid nephrotoxic meds.  Leukocytosis: Improved.  Likely demargination.  Improved. -Continue monitoring  Elevated liver enzymes: Likely congestive hepatopathy from PE.  Alcohol could play role.  Resolving. -Continue monitoring  Hyperglycemia/prediabetes: A1c 6.4%. -Counsel on lifestyle change including diet and exercise  COPD? -Continue as needed  DuoNeb  Alcohol abuse: No withdrawal symptoms. -Encourage cessation  Tobacco use disorder -Encourage cessation.                   DVT prophylaxis: On heparin drip for PE/a flutter Code Status: Full code Family Communication: Patient and/or RN. Available if any question.  Status is: Inpatient  Remains inpatient appropriate because:IV treatments appropriate due to intensity of illness or inability to take PO, Inpatient level of care appropriate due to severity of illness and ongoing requirement of high level oxygen and pending therapy evaluation   Dispo: The patient is from: Home              Anticipated d/c is to: Home              Anticipated d/c date is: 2 days              Patient currently is not medically stable to d/c.        Consultants:  Cardiology IR PCCM   Sch Meds:  Scheduled Meds: . amiodarone  400 mg Oral Daily  . apixaban   Does not apply Once  . apixaban  10 mg Oral BID   Followed by  . [START ON 01/10/2020] apixaban  5 mg Oral BID  . furosemide  40 mg Intravenous Q12H  . metoprolol succinate  50 mg Oral Daily  .  pantoprazole  40 mg Oral Daily  . sodium chloride flush  3 mL Intravenous Q12H   Continuous Infusions: . sodium chloride     PRN Meds:.sodium chloride, acetaminophen, alum & mag hydroxide-simeth, docusate sodium, hydrocortisone, ipratropium-albuterol, metoprolol tartrate, ondansetron (ZOFRAN) IV, oxyCODONE, polyethylene glycol, sodium chloride flush  Antimicrobials: Anti-infectives (From admission, onward)   Start     Dose/Rate Route Frequency Ordered Stop   12/29/19 1815  cefTRIAXone (ROCEPHIN) 1 g in sodium chloride 0.9 % 100 mL IVPB  Status:  Discontinued     1 g 200 mL/hr over 30 Minutes Intravenous  Once 12/29/19 1800 12/29/19 1903   12/29/19 1815  azithromycin (ZITHROMAX) 500 mg in sodium chloride 0.9 % 250 mL IVPB  Status:  Discontinued     500 mg 250 mL/hr over 60 Minutes Intravenous  Once 12/29/19 1800 12/29/19 1903        I have personally reviewed the following labs and images: CBC: Recent Labs  Lab 01/03/20 0458 01/04/20 0321 01/05/20 0256 01/06/20 0453 01/07/20 0352  WBC 14.1* 14.7* 13.9* 14.8* 13.6*  HGB 11.1* 11.2* 10.9* 10.9* 11.6*  HCT 32.1* 32.1* 31.1* 31.1* 33.6*  MCV 81.5 81.1 79.5* 80.4 79.8*  PLT 156 206 246 287 339   BMP &GFR Recent Labs  Lab 01/02/20 0231 01/02/20 1401 01/02/20 1416 01/03/20 0458 01/04/20 0321 01/05/20 0256 01/06/20 0453 01/07/20 0352 01/07/20 0919  NA 140   < > 144 139 135 135  --   --  135  K 4.3   < > 4.3 4.2 4.7 4.5  --   --  3.9  CL 110  --   --  112* 108 106  --   --  103  CO2 11*  --   --  19* 20* 19*  --   --  23  GLUCOSE 136*  --   --  117* 156* 131*  --   --  139*  BUN 33*  --   --  25* 24* 22*  --   --  17  CREATININE 1.74*  --   --  1.62* 1.80* 1.66*  --   --  1.49*  CALCIUM 7.6*  --   --  7.6* 7.5* 7.7*  --   --  7.8*  MG  --   --   --  2.1 1.8 1.8 1.8 1.7  --   PHOS  --   --   --  2.8 2.1* 2.9  --   --  2.7   < > = values in this interval not displayed.   Estimated Creatinine Clearance: 58.6 mL/min (A) (by C-G formula based on SCr of 1.49 mg/dL (H)). Liver & Pancreas: Recent Labs  Lab 01/01/20 0254 01/01/20 0254 01/02/20 0231 01/03/20 0458 01/04/20 0321 01/05/20 0256 01/07/20 0919  AST 91*  --  59* 36 45*  --   --   ALT 122*  --  86* 67* 64*  --   --   ALKPHOS 109  --  97 96 98  --   --   BILITOT 1.0  --  0.6 0.7 0.7  --   --   PROT 6.2*  --  5.7* 5.6* 5.8*  --   --   ALBUMIN 1.8*   < > 1.6* 1.5* 1.5* 1.5* 1.5*   < > = values in this interval not displayed.   No results for input(s): LIPASE, AMYLASE in the last 168 hours. No results for input(s): AMMONIA in the last 168 hours. Diabetic:  No results for input(s): HGBA1C in the last 72 hours. Recent Labs  Lab 01/04/20 2110  GLUCAP 160*   Cardiac Enzymes: No results for input(s): CKTOTAL, CKMB, CKMBINDEX, TROPONINI in the last 168 hours. No results for input(s):  PROBNP in the last 8760 hours. Coagulation Profile: No results for input(s): INR, PROTIME in the last 168 hours. Thyroid Function Tests: No results for input(s): TSH, T4TOTAL, FREET4, T3FREE, THYROIDAB in the last 72 hours. Lipid Profile: No results for input(s): CHOL, HDL, LDLCALC, TRIG, CHOLHDL, LDLDIRECT in the last 72 hours. Anemia Panel: No results for input(s): VITAMINB12, FOLATE, FERRITIN, TIBC, IRON, RETICCTPCT in the last 72 hours. Urine analysis:    Component Value Date/Time   COLORURINE YELLOW 12/30/2019 0430   APPEARANCEUR CLEAR 12/30/2019 0430   LABSPEC 1.036 (H) 12/30/2019 0430   PHURINE 5.0 12/30/2019 0430   GLUCOSEU NEGATIVE 12/30/2019 0430   HGBUR SMALL (A) 12/30/2019 0430   BILIRUBINUR NEGATIVE 12/30/2019 0430   BILIRUBINUR neg 01/12/2013 1714   KETONESUR NEGATIVE 12/30/2019 0430   PROTEINUR 30 (A) 12/30/2019 0430   UROBILINOGEN 0.2 01/12/2013 1714   NITRITE NEGATIVE 12/30/2019 0430   LEUKOCYTESUR NEGATIVE 12/30/2019 0430   Sepsis Labs: Invalid input(s): PROCALCITONIN, [place]  Microbiology: Recent Results (from the past 240 hour(s))  Blood culture (routine x 2)     Status: None   Collection Time: 12/29/19 10:21 PM   Specimen: BLOOD  Result Value Ref Range Status   Specimen Description BLOOD SITE NOT SPECIFIED  Final   Special Requests   Final    BOTTLES DRAWN AEROBIC AND ANAEROBIC Blood Culture adequate volume   Culture   Final    NO GROWTH 5 DAYS Performed at [facility], [address] [place], [place] [address]    Report Status 01/03/2020 FINAL  Final  Respiratory Panel by RT PCR (Flu A&B, Covid) - Nasopharyngeal Swab     Status: None   Collection Time: 12/29/19 10:21 PM   Specimen: Nasopharyngeal Swab  Result Value Ref Range Status   SARS Coronavirus 2 by RT PCR NEGATIVE NEGATIVE Final    Comment: (NOTE) SARS-CoV-2 target nucleic acids are NOT DETECTED. The SARS-CoV-2 RNA is generally detectable in upper respiratoy specimens during  the acute phase of infection. The lowest concentration of SARS-CoV-2 viral copies this assay can detect is 131 copies/mL. A negative result does not preclude SARS-Cov-2 infection and should not be used as the sole basis for treatment or other patient management decisions. A negative result may occur with  improper specimen collection/handling, submission of specimen other than nasopharyngeal swab, presence of viral mutation(s) within the areas targeted by this assay, and inadequate number of viral copies (<131 copies/mL). A negative result must be combined with clinical observations, patient history, and epidemiological information. The expected result is Negative. Fact Sheet for Patients:  PinkCheek.be Fact Sheet for Healthcare Providers:  GravelBags.it This test is not yet ap proved or cleared by the [place] FDA and  has been authorized for detection and/or diagnosis of SARS-CoV-2 by FDA under an Emergency Use Authorization (EUA). This EUA will remain  in effect (meaning this test can be used) for the duration of the COVID-19 declaration under Section 564(b)(1) of the Act, 21 U.S.C. section 360bbb-3(b)(1), unless the authorization is terminated or revoked sooner.    Influenza A by PCR NEGATIVE NEGATIVE Final   Influenza B by PCR NEGATIVE NEGATIVE Final    Comment: (NOTE) The Xpert Xpress SARS-CoV-2/FLU/RSV assay is intended as an aid in  the  diagnosis of influenza from Nasopharyngeal swab specimens and  should not be used as a sole basis for treatment. Nasal washings and  aspirates are unacceptable for Xpert Xpress SARS-CoV-2/FLU/RSV  testing. Fact Sheet for Patients: PinkCheek.be Fact Sheet for Healthcare Providers: GravelBags.it This test is not yet approved or cleared by the [place] FDA and  has been authorized for detection and/or diagnosis of  SARS-CoV-2 by  FDA under an Emergency Use Authorization (EUA). This EUA will remain  in effect (meaning this test can be used) for the duration of the  Covid-19 declaration under Section 564(b)(1) of the Act, 21  U.S.C. section 360bbb-3(b)(1), unless the authorization is  terminated or revoked. Performed at [facility], [place] [address] [place],  [address]     Radiology Studies: No results found.   Coben Godshall T. Hopkins  If 7PM-7AM, please contact night-coverage [email or web address] Password [facility] 01/07/2020, 12:47 PM

## 2020-01-07 NOTE — Progress Notes (Signed)
Pt changed from heated HFNC .50 @ 20 l/m to [place] HFNC at 15 l/m.  o2 saturation is 95% and pt states he feels more comfortable.

## 2020-01-07 NOTE — Progress Notes (Addendum)
Progress Note  Patient Name: Cody Joyce Date of Encounter: 01/07/2020  Primary Cardiologist: Sanda Klein, MD   Subjective   Cardiology reasked to evaluate patient for diuretic management. Breathing is improved but still required high flow oxygen Again back in a flutter with RVR  Inpatient Medications    Scheduled Meds: . amiodarone  400 mg Oral Daily  . apixaban   Does not apply Once  . apixaban  10 mg Oral BID   Followed by  . [START ON 01/10/2020] apixaban  5 mg Oral BID  . carvedilol  3.125 mg Oral BID WC  . furosemide  40 mg Intravenous Q12H  . pantoprazole  40 mg Oral Daily  . sodium chloride flush  3 mL Intravenous Q12H   Continuous Infusions: . sodium chloride     PRN Meds: sodium chloride, acetaminophen, alum & mag hydroxide-simeth, docusate sodium, hydrocortisone, ipratropium-albuterol, metoprolol tartrate, ondansetron (ZOFRAN) IV, oxyCODONE, polyethylene glycol, sodium chloride flush   Vital Signs    Vitals:   01/07/20 0103 01/07/20 0340 01/07/20 0738 01/07/20 0825  BP:   113/72   Pulse:  98 64   Resp:  (!) 23    Temp:   98.3 F (36.8 C)   TempSrc:   Oral   SpO2: 97% 95% 97% 92%  Weight:      Height:        Intake/Output Summary (Last 24 hours) at 01/07/2020 1208 Last data filed at 01/07/2020 0800 Gross per 24 hour  Intake 2400 ml  Output 3550 ml  Net -1150 ml   Last 3 Weights 01/06/2020 01/05/2020 01/03/2020  Weight (lbs) 185 lb 13.6 oz 186 lb 11.7 oz 185 lb  Weight (kg) 84.3 kg 84.7 kg 83.915 kg      Telemetry    Atrial flutter at rate of 120 bpm- Personally Reviewed  ECG    No new tracing  Physical Exam   GEN: No acute distress.   Neck: No JVD Cardiac:  Regular tachycardic, no murmurs, rubs, or gallops.  Respiratory: Faint bibasilar Rales GI: Soft, nontender, non-distended  MS: No edema; No deformity. Neuro:  Nonfocal  Psych: Normal affect   Labs    High Sensitivity Troponin:   Recent Labs  Lab 12/31/19 1858 12/31/19 2258  01/01/20 0254 01/01/20 0555 01/01/20 0715  TROPONINIHS 3,000* 7,544* 11,131* 11,154* 10,744*      Chemistry Recent Labs  Lab 01/02/20 0231 01/02/20 1401 01/03/20 0458 01/03/20 0458 01/04/20 0321 01/05/20 0256 01/07/20 0919  NA 140   < > 139   < > 135 135 135  K 4.3   < > 4.2   < > 4.7 4.5 3.9  CL 110  --  112*   < > 108 106 103  CO2 11*  --  19*   < > 20* 19* 23  GLUCOSE 136*  --  117*   < > 156* 131* 139*  BUN 33*  --  25*   < > 24* 22* 17  CREATININE 1.74*  --  1.62*   < > 1.80* 1.66* 1.49*  CALCIUM 7.6*  --  7.6*   < > 7.5* 7.7* 7.8*  PROT 5.7*  --  5.6*  --  5.8*  --   --   ALBUMIN 1.6*  --  1.5*   < > 1.5* 1.5* 1.5*  AST 59*  --  36  --  45*  --   --   ALT 86*  --  67*  --  64*  --   --  ALKPHOS 97  --  96  --  98  --   --   BILITOT 0.6  --  0.7  --  0.7  --   --   GFRNONAA 42*  --  46*   < > 40* 44* 51*  GFRAA 49*  --  53*   < > 47* 52* 59*  ANIONGAP 19*  --  8   < > 7 10 9    < > = values in this interval not displayed.     Hematology Recent Labs  Lab 01/05/20 0256 01/06/20 0453 01/07/20 0352  WBC 13.9* 14.8* 13.6*  RBC 3.91* 3.87* 4.21*  HGB 10.9* 10.9* 11.6*  HCT 31.1* 31.1* 33.6*  MCV 79.5* 80.4 79.8*  MCH 27.9 28.2 27.6  MCHC 35.0 35.0 34.5  RDW 15.8* 15.9* 15.6*  PLT 246 287 339    BNP Recent Labs  Lab 01/06/20 0453  BNP 1,710.4*     DDimer No results for input(s): DDIMER in the last 168 hours.   Radiology    DG Chest 1 View  Result Date: 01/06/2020 CLINICAL DATA:  Post pulmonary arterial thrombectomy 12/30/2019. Shortness of breath. EXAM: CHEST  1 VIEW COMPARISON:  01/06/2020 FINDINGS: Lungs are adequately inflated demonstrate moderate hazy airspace opacification over the central lungs with possible slight interval worsening over the right mid to upper lung. Findings may be due to moderate interstitial edema versus infection. No effusion. Stable cardiomegaly. Remainder of the exam is unchanged. IMPRESSION: Slight interval worsening  moderate hazy airspace process over the central lungs suggesting moderate interstitial edema and less likely infection. Electronically Signed   By: Marin Olp M.D.   On: 01/06/2020 09:13   DG CHEST PORT 1 VIEW  Result Date: 01/06/2020 CLINICAL DATA:  Tachypnea EXAM: PORTABLE CHEST 1 VIEW COMPARISON:  12/29/2019 FINDINGS: Mild bilateral interstitial pulmonary edema. No pleural effusion or pneumothorax. No focal airspace consolidation. Mild cardiomegaly. IMPRESSION: Mild interstitial pulmonary edema. Electronically Signed   By: Ulyses Jarred M.D.   On: 01/06/2020 01:55    Cardiac Studies   R/L HEART CATH 01/03/2020   Normal coronary arteries.  No evidence of left-to-right shunting by oximetry.  Mild elevation in pulmonary artery pressures.  Technically unable to obtain pulmonary capillary wedge pressure.  Normal left ventricular end-diastolic pressure.  RECOMMENDATIONS:   Resume anticoagulation therapy for pulmonary embolism as soon as possible.  Fick Cardiac Output 5.15 L/min  Fick Cardiac Output Index 2.51 (L/min)/BSA  RA A Wave 17 mmHg  RA V Wave 16 mmHg  RA Mean 15 mmHg  RV Systolic Pressure 38 mmHg  RV Diastolic Pressure 12 mmHg  RV EDP 17 mmHg  PA Systolic Pressure 37 mmHg  PA Diastolic Pressure 20 mmHg  PA Mean 26 mmHg  PW A Wave 21 mmHg  PW V Wave 37 mmHg  PW Mean 26 mmHg  AO Systolic Pressure 123456 mmHg  AO Diastolic Pressure 78 mmHg  AO Mean 91 mmHg  LV Systolic Pressure [id number] mmHg  LV Diastolic Pressure 12 mmHg  LV EDP 16 mmHg  AOp Systolic Pressure 123456 mmHg  AOp Diastolic Pressure 79 mmHg  AOp Mean Pressure 91 mmHg  LVp Systolic Pressure 123XX123 mmHg  LVp Diastolic Pressure 13 mmHg  LVp EDP Pressure 16 mmHg  QP/QS 1.03  TPVR Index 10.35 HRUI  TSVR Index 36.23 HRUI  TPVR/TSVR Ratio 0.29   ECHO 04/25 and 04/26  1. Left ventricular ejection fraction, by estimation, is 35 to 40%. The  left ventricle has moderately decreased  function. The left  ventricle  demonstrates global hypokinesis. Left ventricular diastolic parameters are  consistent with Grade I diastolic  dysfunction (impaired relaxation). There is the interventricular septum is  flattened in systole and diastole, consistent with right ventricular  pressure and volume overload.  2. Right ventricular systolic function is moderately reduced.  3. Right atrial size was moderately dilated.  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 Trivial mitral valve regurgitation.  5. Unable to obtain adequate Doppler signal to estimate pulmonary  pressures. Tricuspid valve regurgitation is trivial.   1. Agitated saline contrast bubble study was positive with shunting  observed within 5 cardiac cycles suggestive of interatrial shunt.  2. Right ventricular systolic function is mildly reduced. The right  ventricular size is moderately enlarged.  3. Remainder of findings per transthoracic echocardiogram 12/30/19.   TEE 01/03/20 1. There is a smoke in the left ventricular apex with no evidence for a  thrombus on Definity echo contrast images.. Left ventricular ejection  fraction, by estimation, is 30 to 35%. The left ventricle has moderately  decreased function. The left ventricle  demonstrates global hypokinesis. There is the interventricular septum is  flattened in systole and diastole, consistent with right ventricular  pressure and volume overload.  2. Right ventricular systolic function is severely reduced. The right  ventricular size is severely enlarged. There is normal pulmonary artery  systolic pressure.  3. Left atrial size was moderately dilated. No left atrial/left atrial  appendage thrombus was detected.  4. Right atrial size was moderately dilated.  5. The mitral valve is normal in structure. Mild mitral valve  regurgitation. No evidence of mitral stenosis.  6. The aortic valve is normal in structure. Aortic valve regurgitation is  not visualized. No aortic  stenosis is present.  7. The inferior vena cava is normal in size with greater than 50%  respiratory variability, suggesting right atrial pressure of 3 mmHg.  8. Agitated saline contrast bubble study was significantly positive with  shunting observed within the first cardiac cycles suggestive of  interatrial shunt. There is a large patent foramen ovale with  bidirectional shunting across atrial septum.   Conclusion(s)/Recommendation(s): Findings are concerning for an  interatrial shunt as detailed above.   Patient Profile     59 y.o. male with submassive pulmonary embolism and synchronous paradoxical embolic renal infarct, L femoral DVT, newly recognized left ventricular systolic dysfunction, markedly elevated cardiac enzymes c/w NSTEMI, normal coronary arteries by angio and suspicion of ASD.   Assessment & Plan    1.  Chronic systolic and diastolic heart failure -Echocardiogram showed LV function of 35 to 40% with global hypokinesis and grade 1 diastolic dysfunction.  Cath with normal coronaries.  Felt possible Takotsubo or nonischemic cardiomyopathy.  He was not fluid overloaded when last seen by Dr. Sallyanne Kuster 4/30. -Early morning of 5/2 patient had acute hypoxic respiratory failure requiring further high flow oxygen and eventually BiPAP.  Chest x-ray with pulmonary edema.  He was started on IV Lasix and discharged.  During acute hypoxic event he was briefly in atrial flutter RVR and then converted and then again went into a flutter RVR at rate of 120s -Likely he will need low-dose Lasix. -On Coreg 3.125 mg twice daily -Not on ACE or ARB due to AKI  2.  Paroxysmal atrial flutter with rapid ventricular rate -Had a brief atrial flutter with rapid ventricular rate episode on 5/2 that may have led to pulmonary edema.  He was cardioverted and then again went into a  flutter with RVR with sustained rate of 120s -Monitor amiodarone 400 mg daily -On Eliquis for anticoagulation  3.  Paradoxical embolism/PFO - Plan to follow up with structural team as outpatient - No significant shunt on cath, however saline contrast did suggest bidirectional shunt - TEE showed shunting suggestive of interatrail shunt and large PFO with bidirectional shunt across atrial septum  4. PE and DVT - Per primary team   5. NSTEMI - cath with normal coronaries  Changed Coreg to Toprol 50mg  qd for better rate control/ Has follow up with Dr. Burt Knack 5/7 for PFO discussion.    For questions or updates, please contact [place] Please consult [email or web address] for contact info under        SignedLeanor Kail, [place]  01/07/2020, 12:08 PM

## 2020-01-07 NOTE — Progress Notes (Signed)
Occupational Therapy Treatment Patient Details Name: Cody Joyce MRN: [id number] DOB: 12/27/1960 Today's Date: 01/07/2020    History of present illness 59 y.o. M with PMH of tobacco use, alcohol use and emphysema who had a tree branch fall on his right rib-cage on 4/15. He had a CXR which showed no fracture, but since then has not been moving much due to pain.  Found to have submassive PE. Pt also states he tore is R gastroc and quad but no evidence of this in chart review or symptoms. Pt also with LLE DVT. Pt is s/p  thrombectomy of both pulmonary arteries by IR on 4/25. Pt is s/p TEE that showed severely enlarged RV and large PFO with bidirectional shunting across the atrial septum but no thrombus.    OT comments  This 59 yo male seen today to educate on energy conservation and provide handout on as such. He currently is on HFNC with satting in low 90's and HR in 80-100s. Encouraged him to work on purse lipped breathing every hour he is awake and to sit up on the EOB while he does this so he is not just laying in the bed most of the time. Made CM aware via chat text that pt needs a shower seat and information on getting a handicapped parking placard.   Follow Up Recommendations  No OT follow up;Supervision - Intermittent    Equipment Recommendations  Tub/shower seat       Precautions / Restrictions Precautions Precaution Comments: watch SpO2 Restrictions Weight Bearing Restrictions: No              ADL either performed or assessed with clinical judgement   ADL                                         General ADL Comments: Educated pt on energy conservation strategies from handout. Went over handout and discussed with him how important it is right now to sit to to activties as much as possible (including a seat for shower), to not in situations where he gets too hot (ie; hot showers, hands in hot dish water, riding in a hot car) due to increased temperature can  increase his work of breathing, and listening to his body (if it is telling him to rest, then he needs to).     Vision Patient Visual Report: No change from baseline            Cognition Arousal/Alertness: Awake/alert Behavior During Therapy: WFL for tasks assessed/performed Overall Cognitive Status: Within Functional Limits for tasks assessed                                                     Pertinent Vitals/ Pain       Pain Assessment: No/denies pain         Frequency  Min 2X/week        Progress Toward Goals  OT Goals(current goals can now be found in the care plan section)  Progress towards OT goals: Progressing toward goals     Plan Discharge plan remains appropriate       AM-PAC OT "6 Clicks" Daily Activity     Outcome Measure   Help from another person eating  meals?: None Help from another person taking care of personal grooming?: None Help from another person toileting, which includes using toliet, bedpan, or urinal?: None Help from another person bathing (including washing, rinsing, drying)?: None Help from another person to put on and taking off regular upper body clothing?: None Help from another person to put on and taking off regular lower body clothing?: None 6 Click Score: 24    End of Session Equipment Utilized During Treatment: Oxygen(HFNC)  OT Visit Diagnosis: Other abnormalities of gait and mobility (R26.89)   Activity Tolerance Patient tolerated treatment well   Patient Left in bed           Time: [id number] OT Time Calculation (min): 34 min    Charges: OT General Charges $OT Visit: 1 Visit OT Treatments $Self Care/Home Management : 23-37 mins  [place], OTR/L Acute [employer] Pager [phone] Office [phone]      Almon Register 01/07/2020, 8:22 PM

## 2020-01-07 NOTE — TOC Progression Note (Addendum)
Transition of Care [facility]) - Progression Note    Patient Details  Name: Cody Joyce MRN: [id number] Date of Birth: [birth year]  [facility] CM/SW Contact  Angelita Ingles, RN Phone Number: 01/07/2020, 10:17 AM  Clinical Narrative:    CM called [facility] several times attempting to reach someone to assist with HH oxygen orders. On hold in excess of 20 minutes with no  response. CM has called numerous times after finally receiving a number for home oxygen. Hilary with the pulmonary department gave CM number for home oxygen and advised CM to leave a message if there was no response. Deidre Ala states that someone will get back with me and that this department is very familiar with the needs for home oxygen. CM has called and left message. Will await return call.   01/07/2020 1600 CM spoke with Janett Billow at [facility] pulmonary. Janett Billow has provided CM with the fax numbers and the info that is needed to initiate home oxygen. Unable to fax info now because info needs to be in by 3pm and the patient currently has higher oxygen requirements. Info has been noted and CM will follow up.  01/08/2020 0830 CM has contacted MD to determine if CM should fax current O2 orders or wait due to higher O2 demands at this time. MD wants CM to wait because patient will have to re qualify for home oxygen.    01/09/2020 1517 CM has faxed script for Eliquis to [facility].  01/10/2020 0920 Home oxygen orders and qualification note has been faxed to [facility] oxygen clinic. Message has also been left on the clinic voicemail. Will await return call.  01/10/2020 1000 CM verified with [facility] that script has been received for Eliquis and med will be mailed to patient. Patient has been updated.  01/11/2020 CM called Home oxygen clinic at [facility] to verify delivery of O2. No answer message left.  01/11/2020 1530 CM has called to follow up on Home oxygen. No answer at the  [facility]. Message has been left. Patient has been updated and states that he has not heard anything from the clinic but he has called them.   01/11/2020  1600 CM received call from [place] at [facility] oxygen clinic. Caryl Pina asked CM to fax oxygen information again. Info has been faxed. Caryl Pina states that she will get information and notify for oxygen to be sent out to patient this evening with worse case scenario being in the morning. Patient  and bedside nurse have been updated.   Expected Discharge Plan and Services           Expected Discharge Date: 01/05/20                                     Social Determinants of Health (SDOH) Interventions    Readmission Risk Interventions No flowsheet data found.

## 2020-01-07 NOTE — Progress Notes (Signed)
Physical Therapy Treatment Patient Details Name: Cody Joyce MRN: [id number] DOB: 1961-02-03 Today's Date: 01/07/2020    History of Present Illness 59 y.o. M with PMH of tobacco use, alcohol use and emphysema who had a tree branch fall on his right rib-cage on 4/15. He had a CXR which showed no fracture, but since then has not been moving much due to pain.  Found to have submassive PE. Pt also states he tore is R gastroc and quad but no evidence of this in chart review or symptoms. Pt also with LLE DVT. Pt is s/p  thrombectomy of both pulmonary arteries by IR on 4/25. Pt is s/p TEE that showed severely enlarged RV and large PFO with bidirectional shunting across the atrial septum but no thrombus.     PT Comments    Assisted to change bed linen as ice pack had burst in his bed, pt stand pivot to recliner with S.  Able to participate in activities at bedside today as he was requiring high flow oxygen.  Standing side steps and marching and heel raises for strength and endurance cues for self monitoring with seated rest breaks between activities.  PT to follow during acute stay.    Follow Up Recommendations  No PT follow up     Equipment Recommendations  None recommended by PT    Recommendations for Other Services       Precautions / Restrictions Precautions Precautions: Other (comment) Precaution Comments: watch SpO2    Mobility  Bed Mobility Overal bed mobility: Modified Independent                Transfers Overall transfer level: Modified independent                  Ambulation/Gait             General Gait Details: no ambulation as pt on 20L HFNC, did activties at the bedside   Stairs             Wheelchair Mobility    Modified Rankin (Stroke Patients Only)       Balance Overall balance assessment: Needs assistance   Sitting balance-Leahy Scale: Normal       Standing balance-Leahy Scale: Good Standing balance comment: standing side  stepping with UE support, marching in place 1 HHA, seated rest in between all activities                            Cognition Arousal/Alertness: Awake/alert Behavior During Therapy: WFL for tasks assessed/performed Overall Cognitive Status: Within Functional Limits for tasks assessed                                 General Comments: a little agitated initially due to no one coming to help him fix his bed after ice pack burst and sitting with blanket around him and no gown on      Exercises Other Exercises Other Exercises: seated heel raises with elbows on his knees for strengthening    General Comments General comments (skin integrity, edema, etc.): on 20L HFNC, SpO2 droping to 81% then back up to 91% with seated rest      Pertinent Vitals/Pain Pain Assessment: No/denies pain    Home Living                      Prior Function  PT Goals (current goals can now be found in the care plan section) Progress towards PT goals: Not progressing toward goals - comment(due to limited with HFNC needs)    Frequency    Min 3X/week      PT Plan Current plan remains appropriate    Co-evaluation              AM-PAC PT "6 Clicks" Mobility   Outcome Measure  Help needed turning from your back to your side while in a flat bed without using bedrails?: None Help needed moving from lying on your back to sitting on the side of a flat bed without using bedrails?: None Help needed moving to and from a bed to a chair (including a wheelchair)?: None Help needed standing up from a chair using your arms (e.g., wheelchair or bedside chair)?: None Help needed to walk in hospital room?: None Help needed climbing 3-5 steps with a railing? : A Little 6 Click Score: 23    End of Session Equipment Utilized During Treatment: Oxygen Activity Tolerance: Patient tolerated treatment well Patient left: in bed;with call bell/phone within reach   PT  Visit Diagnosis: Other abnormalities of gait and mobility (R26.89)     Time: [id number] PT Time Calculation (min) (ACUTE ONLY): 33 min  Charges:  $Therapeutic Activity: 23-37 mins                     Magda Kiel, PT Acute Rehabilitation Services [phone] 01/07/2020    Reginia Naas 01/07/2020, 2:25 PM

## 2020-01-08 DIAGNOSIS — I2601 Septic pulmonary embolism with acute cor pulmonale: Secondary | ICD-10-CM | POA: Diagnosis not present

## 2020-01-08 DIAGNOSIS — Q211 Atrial septal defect: Secondary | ICD-10-CM | POA: Diagnosis not present

## 2020-01-08 DIAGNOSIS — I214 Non-ST elevation (NSTEMI) myocardial infarction: Secondary | ICD-10-CM

## 2020-01-08 LAB — CBC
HCT: 34.2 % — ABNORMAL LOW (ref 39.0–52.0)
Hemoglobin: 11.8 g/dL — ABNORMAL LOW (ref 13.0–17.0)
MCH: 27.7 pg (ref 26.0–34.0)
MCHC: 34.5 g/dL (ref 30.0–36.0)
MCV: 80.3 fL (ref 80.0–100.0)
Platelets: 395 10*3/uL (ref 150–400)
RBC: 4.26 MIL/uL (ref 4.22–5.81)
RDW: 15.9 % — ABNORMAL HIGH (ref 11.5–15.5)
WBC: 12.2 10*3/uL — ABNORMAL HIGH (ref 4.0–10.5)
nRBC: 1.1 % — ABNORMAL HIGH (ref 0.0–0.2)

## 2020-01-08 LAB — MAGNESIUM: Magnesium: 1.6 mg/dL — ABNORMAL LOW (ref 1.7–2.4)

## 2020-01-08 LAB — RENAL FUNCTION PANEL
Albumin: 1.6 g/dL — ABNORMAL LOW (ref 3.5–5.0)
Anion gap: 10 (ref 5–15)
BUN: 22 mg/dL — ABNORMAL HIGH (ref 6–20)
CO2: 27 mmol/L (ref 22–32)
Calcium: 8.3 mg/dL — ABNORMAL LOW (ref 8.9–10.3)
Chloride: 100 mmol/L (ref 98–111)
Creatinine, Ser: 1.68 mg/dL — ABNORMAL HIGH (ref 0.61–1.24)
GFR calc Af Amer: 51 mL/min — ABNORMAL LOW (ref >60)
GFR calc non Af Amer: 44 mL/min — ABNORMAL LOW (ref >60)
Glucose, Bld: 106 mg/dL — ABNORMAL HIGH (ref 70–99)
Phosphorus: 3.5 mg/dL (ref 2.5–4.6)
Potassium: 3.8 mmol/L (ref 3.5–5.1)
Sodium: 137 mmol/L (ref 135–145)

## 2020-01-08 MED ORDER — POTASSIUM CHLORIDE CRYS ER 20 MEQ PO TBCR
40.0000 meq | EXTENDED_RELEASE_TABLET | Freq: Once | ORAL | Status: AC
  Administered 2020-01-08: 40 meq via ORAL
  Filled 2020-01-08: qty 2

## 2020-01-08 MED ORDER — MAGNESIUM SULFATE 4 GM/100ML IV SOLN
4.0000 g | Freq: Once | INTRAVENOUS | Status: AC
  Administered 2020-01-08: 4 g via INTRAVENOUS
  Filled 2020-01-08: qty 100

## 2020-01-08 MED ORDER — METOPROLOL TARTRATE 5 MG/5ML IV SOLN: 2.5000 mg | INTRAVENOUS | Status: DC | PRN

## 2020-01-08 MED ORDER — FUROSEMIDE 40 MG PO TABS
40.0000 mg | ORAL_TABLET | Freq: Every day | ORAL | Status: DC
  Administered 2020-01-08 – 2020-01-10 (×3): 40 mg via ORAL
  Filled 2020-01-08 (×3): qty 1

## 2020-01-08 NOTE — Progress Notes (Signed)
Cody Joyce [id number] DOB: 04-May-1961   PCP: Patient, No Pcp Per  Patient is from: home  DOA: 12/29/2019 LOS: 10  Brief Narrative / Interim history: 59 y.o. male with history of COPD, chronic alcoholism and tobacco abuse who had a tree branch fall on his right rib cage on 4/15, negative chest x-ray but since been less mobile. Patient presented to the ED on 12/29/2019 from home with complaint of shortness of breath, chest pain.  EMS noted oxygen saturation of 60% brought to the ED on NRB.  CT angio chest showed large bilateral pulmonary emboli with evidence of right heart strain and an adherent mural thrombus along the anterior wall of the RV with bilateral groundglass opacities likely developing pulmonary infarctions, underlying emphysema. CT abdomen pelvis showed multiple left renal cortical infarcts. Patient was admitted to critical care on IV heparin drip.  Patient underwent thrombectomy of both pulmonary arteries by IR on 4/25.Patient subsequently improved and was transferred to hospital service on 4/26.  Patient had brief episode of atrial flutter on 4/27.  He was started on IV amiodarone.   R/LHC on 4/28 with mild pulmonary hypertension but normal coronaries, LVEDP and no evidence of left-to-right shunt by oxymetry.   TEE on 4/29 with LVEF of 30 to 35%, global hypokinesis, interventricular septum flattening, severely reduced RVSF and severely enlarged RV and large PFO with bidirectional shunting across the atrial septum but no thrombus.  Patient was cleared for discharge by cardiology but developed pulmonary edema with increased O2 requirement.  Started on IV Lasix on 5/2 and responded well.  O2 requirement improved but still on 12 L by HFNC.  Transition to oral Lasix 40 mg daily. Could be discharged on 5/5 after ambulatory saturation assessment if O2 requirement within reasonable range.   Subjective: Seen and examined earlier this morning.  No major events  overnight of this morning.  Excellent urine output with IV Lasix.  Reports improvement in his breathing and lower extremity swelling.  Denies chest pain, GI or UTI symptoms. Objective: Vitals:   01/08/20 0042 01/08/20 0451 01/08/20 0729 01/08/20 0802  BP: 99/86  (!) 113/92   Pulse:   (!) 52   Resp: 20  16 18   Temp:   97.6 F (36.4 C)   TempSrc:      SpO2: 97%  95% 94%  Weight:  79.5 kg    Height:        Intake/Output Summary (Last 24 hours) at 01/08/2020 1446 Last data filed at 01/08/2020 0600 Gross per 24 hour  Intake 120 ml  Output 2850 ml  Net -2730 ml   Filed Weights   01/05/20 0300 01/06/20 0500 01/08/20 0451  Weight: 84.7 kg 84.3 kg 79.5 kg    Examination:  GENERAL: No apparent distress.  Nontoxic. HEENT: MMM.  Vision and hearing grossly intact.  NECK: Supple.  No apparent JVD.  RESP: 94% on 12 L by HFNC.  No IWOB.  Fair aeration bilaterally. CVS: Regular rhythm.  Normal rate. Heart sounds normal.  ABD/GI/GU: Bowel sounds present. Soft. Non tender.  MSK/EXT:  Moves extremities. No apparent deformity. No edema.  SKIN: no apparent skin lesion or wound NEURO: Awake, alert and oriented appropriately.  No apparent focal neuro deficit. PSYCH: Calm. Normal affect.  Procedures:  4/25-thrombectomy of both pulmonary arteries by IR. 4/28-R/LHC with normal coronaries and mildly elevated pulmonary artery pressures 4/29-TEE with LVEF of 30 to 35%, global hypokinesis, interventricular septum flattening, severely reduced RVSF and severely enlarged RV,  large PFO with bidirectional shunting across the atrial septum but no thrombus    Microbiology summarized: 4/24-COVID-19 PCR negative. 4/24-influenza PCR negative. 4/24-blood cultures negative.  Assessment & Plan: Acute respiratory failure with hypoxia-multifactorial including PE, PFO shunting, new onset systolic CHF, a flutter and possible NSTEMI.   R/LHC and TEE as above.  CXR concerning for pulmonary interstitial edema.  -Treat treatable causes as below. -Wean oxygen as able.  Ambulatory saturation in the morning. -Encourage incentive spirometry -PT/OT evaluation.  New acute combined CHF: Echo on 4/25 with EF of 35 to 40%, global hypokinesis, dilated right heart chamber, moderate range reduced RVEF with signs of pressure and volume overload.  BNP> 1000.  Clinically no significant signs of fluid overload.  R/LHC and TEE as above.  CXR with pulmonary interstitial edema.  Started on IV Lasix.  UOP 3 L / 24 hours.  Net -10 L.  Renal function stable. -Appreciate cardiology input-p.o. Lasix 40 mg daily -Cardiology changed Coreg to metoprolol XL -Monitor fluid status, renal function and electrolytes.  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 -TEE confirms large PFO.  Cardiology to arrange outpatient follow-up with the structural heart team -Started starter pack Eliquis on 4/30.  New onset atrial flutter with RVR: Converted to sinus rhythm.  CHA2DS2-VASc score> 3. -Amiodarone 400 mg daily for 2 weeks (4/29-5/13) followed by 200 mg -Now on metoprolol XL for better rate control -On Eliquis for anticoagulation  NSTEMI: HS Trop> 600 (admit)>>> 11,000>.  R/LHC unrevealing.  Chest pain-free now. -On low-dose Coreg.  AKI/mild azotemia: baseline Cr normal> 1.96 (admit)>>>1.74> 1.62>1.8>> 1.49> 1.68. CT abdomen found multiple left renal cortical infarcts likely due to paradoxical embolism. -Continue to monitor. -Avoid nephrotoxic meds.  Leukocytosis: Improved.  Likely demargination.  Improved. -Continue monitoring  Elevated liver enzymes: Likely congestive hepatopathy from PE.  Alcohol could play role.  Resolving. -Recheck at follow-up.  Hypomagnesemia -Replenish and recheck.   Hyperglycemia/prediabetes: A1c 6.4%. -Counsel on lifestyle change including diet and exercise  COPD? -Continue as needed DuoNeb  Alcohol abuse: No withdrawal symptoms. -Encourage cessation  Tobacco use disorder -Encourage cessation.                   DVT prophylaxis: On Eliquis for PE/DVT/a flutter Code Status: Full code Family Communication: None at bedside.  Updated patient's wife over the phone on 5/3  Status is: Inpatient  Remains inpatient appropriate because:Inpatient level of care appropriate due to severity of illness and High level oxygen requirement and clearance by cardiology   Dispo: The patient is from: Home              Anticipated d/c is to: Home              Anticipated d/c date is: 1 day              Patient currently is not medically stable to d/c.        Consultants:  Cardiology IR PCCM   Sch Meds:  Scheduled Meds: . amiodarone  400 mg Oral Daily  . apixaban   Does not apply Once  . apixaban  10 mg Oral BID   Followed by  . [START ON 01/10/2020] apixaban  5 mg Oral BID  . furosemide  40 mg Oral Daily  . metoprolol succinate  50 mg Oral Daily  .  pantoprazole  40 mg Oral Daily  . sodium chloride flush  3 mL Intravenous Q12H   Continuous Infusions: . sodium chloride     PRN Meds:.sodium chloride, acetaminophen, alum & mag hydroxide-simeth, docusate sodium, hydrocortisone, ipratropium-albuterol, metoprolol tartrate, ondansetron (ZOFRAN) IV, oxyCODONE, polyethylene glycol, sodium chloride flush  Antimicrobials: Anti-infectives (From admission, onward)   Start     Dose/Rate Route Frequency Ordered Stop   12/29/19 1815  cefTRIAXone (ROCEPHIN) 1 g in sodium chloride 0.9 % 100 mL IVPB  Status:  Discontinued     1 g 200 mL/hr over 30 Minutes Intravenous  Once 12/29/19 1800 12/29/19 1903   12/29/19 1815  azithromycin (ZITHROMAX) 500 mg in sodium chloride 0.9 % 250 mL IVPB  Status:  Discontinued     500 mg 250 mL/hr over 60 Minutes  Intravenous  Once 12/29/19 1800 12/29/19 1903       I have personally reviewed the following labs and images: CBC: Recent Labs  Lab 01/04/20 0321 01/05/20 0256 01/06/20 0453 01/07/20 0352 01/08/20 0335  WBC 14.7* 13.9* 14.8* 13.6* 12.2*  HGB 11.2* 10.9* 10.9* 11.6* 11.8*  HCT 32.1* 31.1* 31.1* 33.6* 34.2*  MCV 81.1 79.5* 80.4 79.8* 80.3  PLT 206 246 287 339 395   BMP &GFR Recent Labs  Lab 01/03/20 0458 01/03/20 0458 01/04/20 0321 01/05/20 0256 01/06/20 0453 01/07/20 0352 01/07/20 0919 01/08/20 0335  NA 139  --  135 135  --   --  135 137  K 4.2  --  4.7 4.5  --   --  3.9 3.8  CL 112*  --  108 106  --   --  103 100  CO2 19*  --  20* 19*  --   --  23 27  GLUCOSE 117*  --  156* 131*  --   --  139* 106*  BUN 25*  --  24* 22*  --   --  17 22*  CREATININE 1.62*  --  1.80* 1.66*  --   --  1.49* 1.68*  CALCIUM 7.6*  --  7.5* 7.7*  --   --  7.8* 8.3*  MG 2.1   < > 1.8 1.8 1.8 1.7  --  1.6*  PHOS 2.8  --  2.1* 2.9  --   --  2.7 3.5   < > = values in this interval not displayed.   Estimated Creatinine Clearance: 52 mL/min (A) (by C-G formula based on SCr of 1.68 mg/dL (H)). Liver & Pancreas: Recent Labs  Lab 01/02/20 0231 01/02/20 0231 01/03/20 0458 01/04/20 0321 01/05/20 0256 01/07/20 0919 01/08/20 0335  AST 59*  --  36 45*  --   --   --   ALT 86*  --  67* 64*  --   --   --   ALKPHOS 97  --  96 98  --   --   --   BILITOT 0.6  --  0.7 0.7  --   --   --   PROT 5.7*  --  5.6* 5.8*  --   --   --   ALBUMIN 1.6*   < > 1.5* 1.5* 1.5* 1.5* 1.6*   < > = values in this interval not displayed.   No results for input(s): LIPASE, AMYLASE in the last 168 hours. No results for input(s): AMMONIA in the last 168 hours. Diabetic: No results for input(s): HGBA1C in the last 72 hours. Recent Labs  Lab 01/04/20 2110  GLUCAP 160*   Cardiac Enzymes:  No results for input(s): CKTOTAL, CKMB, CKMBINDEX, TROPONINI in the last 168 hours. No results for input(s): PROBNP in the last  8760 hours. Coagulation Profile: No results for input(s): INR, PROTIME in the last 168 hours. Thyroid Function Tests: No results for input(s): TSH, T4TOTAL, FREET4, T3FREE, THYROIDAB in the last 72 hours. Lipid Profile: No results for input(s): CHOL, HDL, LDLCALC, TRIG, CHOLHDL, LDLDIRECT in the last 72 hours. Anemia Panel: No results for input(s): VITAMINB12, FOLATE, FERRITIN, TIBC, IRON, RETICCTPCT in the last 72 hours. Urine analysis:    Component Value Date/Time   COLORURINE YELLOW 12/30/2019 0430   APPEARANCEUR CLEAR 12/30/2019 0430   LABSPEC 1.036 (H) 12/30/2019 0430   PHURINE 5.0 12/30/2019 0430   GLUCOSEU NEGATIVE 12/30/2019 0430   HGBUR SMALL (A) 12/30/2019 0430   BILIRUBINUR NEGATIVE 12/30/2019 0430   BILIRUBINUR neg 01/12/2013 1714   KETONESUR NEGATIVE 12/30/2019 0430   PROTEINUR 30 (A) 12/30/2019 0430   UROBILINOGEN 0.2 01/12/2013 1714   NITRITE NEGATIVE 12/30/2019 0430   LEUKOCYTESUR NEGATIVE 12/30/2019 0430   Sepsis Labs: Invalid input(s): PROCALCITONIN, [place]  Microbiology: Recent Results (from the past 240 hour(s))  Blood culture (routine x 2)     Status: None   Collection Time: 12/29/19 10:21 PM   Specimen: BLOOD  Result Value Ref Range Status   Specimen Description BLOOD SITE NOT SPECIFIED  Final   Special Requests   Final    BOTTLES DRAWN AEROBIC AND ANAEROBIC Blood Culture adequate volume   Culture   Final    NO GROWTH 5 DAYS Performed at [facility], [address], [place], [place] [address]    Report Status 01/03/2020 FINAL  Final  Respiratory Panel by RT PCR (Flu A&B, Covid) - Nasopharyngeal Swab     Status: None   Collection Time: 12/29/19 10:21 PM   Specimen: Nasopharyngeal Swab  Result Value Ref Range Status   SARS Coronavirus 2 by RT PCR NEGATIVE NEGATIVE Final    Comment: (NOTE) SARS-CoV-2 target nucleic acids are NOT DETECTED. The SARS-CoV-2 RNA is generally detectable in upper respiratoy specimens during the acute phase of  infection. The lowest concentration of SARS-CoV-2 viral copies this assay can detect is 131 copies/mL. A negative result does not preclude SARS-Cov-2 infection and should not be used as the sole basis for treatment or other patient management decisions. A negative result may occur with  improper specimen collection/handling, submission of specimen other than nasopharyngeal swab, presence of viral mutation(s) within the areas targeted by this assay, and inadequate number of viral copies (<131 copies/mL). A negative result must be combined with clinical observations, patient history, and epidemiological information. The expected result is Negative. Fact Sheet for Patients:  PinkCheek.be Fact Sheet for Healthcare Providers:  GravelBags.it This test is not yet ap proved or cleared by the [place] FDA and  has been authorized for detection and/or diagnosis of SARS-CoV-2 by FDA under an Emergency Use Authorization (EUA). This EUA will remain  in effect (meaning this test can be used) for the duration of the COVID-19 declaration under Section 564(b)(1) of the Act, 21 U.S.C. section 360bbb-3(b)(1), unless the authorization is terminated or revoked sooner.    Influenza A by PCR NEGATIVE NEGATIVE Final   Influenza B by PCR NEGATIVE NEGATIVE Final    Comment: (NOTE) The Xpert Xpress SARS-CoV-2/FLU/RSV assay is intended as an aid in  the diagnosis of influenza from Nasopharyngeal swab specimens and  should not be used as a sole basis for treatment. Nasal washings and  aspirates are unacceptable for Xpert Xpress SARS-CoV-2/FLU/RSV  testing. Fact Sheet for Patients: PinkCheek.be Fact Sheet for Healthcare Providers: GravelBags.it This test is not yet approved or cleared by the [place] FDA and  has been authorized for detection and/or diagnosis of SARS-CoV-2 by  FDA under  an Emergency Use Authorization (EUA). This EUA will remain  in effect (meaning this test can be used) for the duration of the  Covid-19 declaration under Section 564(b)(1) of the Act, 21  U.S.C. section 360bbb-3(b)(1), unless the authorization is  terminated or revoked. Performed at [facility], [place] [address] [place], [place] [address]     Radiology Studies: No results found.   Taye T. Lucas  If 7PM-7AM, please contact night-coverage [email or web address] Password [email or web address] 01/08/2020, 2:46 PM

## 2020-01-08 NOTE — Progress Notes (Signed)
Progress Note  Patient Name: Cody Joyce Date of Encounter: 01/08/2020  Primary Cardiologist: Sanda Klein, MD   Subjective   Diuresed almost 3L negative overnight - breathing improved. Creatinine up and down between 1.4-1.6.  Inpatient Medications    Scheduled Meds: . amiodarone  400 mg Oral Daily  . apixaban   Does not apply Once  . apixaban  10 mg Oral BID   Followed by  . [START ON 01/10/2020] apixaban  5 mg Oral BID  . metoprolol succinate  50 mg Oral Daily  . pantoprazole  40 mg Oral Daily  . potassium chloride  40 mEq Oral Once  . sodium chloride flush  3 mL Intravenous Q12H   Continuous Infusions: . sodium chloride    . magnesium sulfate bolus IVPB     PRN Meds: sodium chloride, acetaminophen, alum & mag hydroxide-simeth, docusate sodium, hydrocortisone, ipratropium-albuterol, metoprolol tartrate, ondansetron (ZOFRAN) IV, oxyCODONE, polyethylene glycol, sodium chloride flush   Vital Signs    Vitals:   01/08/20 0042 01/08/20 0451 01/08/20 0729 01/08/20 0802  BP: 99/86  (!) 113/92   Pulse:   (!) 52   Resp: 20  16 18   Temp:   97.6 F (36.4 C)   TempSrc:      SpO2: 97%  95% 94%  Weight:  79.5 kg    Height:        Intake/Output Summary (Last 24 hours) at 01/08/2020 0848 Last data filed at 01/08/2020 0600 Gross per 24 hour  Intake 120 ml  Output 2850 ml  Net -2730 ml   Last 3 Weights 01/08/2020 01/06/2020 01/05/2020  Weight (lbs) 175 lb 4.3 oz 185 lb 13.6 oz 186 lb 11.7 oz  Weight (kg) 79.5 kg 84.3 kg 84.7 kg      Telemetry    Atrial fib rates in the 90's-100's- Personally Reviewed  ECG    No new tracing  Physical Exam   GEN: No acute distress.   Neck: JVP to 1 cm Cardiac:  irregularly irregular Respiratory: lungs clear GI: Soft, nontender, non-distended  MS: No edema; No deformity. Neuro:  Nonfocal  Psych: Normal affect   Labs    High Sensitivity Troponin:   Recent Labs  Lab 12/31/19 1858 12/31/19 2258 01/01/20 0254 01/01/20 0555  01/01/20 0715  TROPONINIHS 3,000* 7,544* 11,131* 11,154* 10,744*      Chemistry Recent Labs  Lab 01/02/20 0231 01/02/20 1401 01/03/20 [id number] 01/03/20 [id number] 01/04/20 0321 01/04/20 0321 01/05/20 0256 01/07/20 0919 01/08/20 0335  NA 140   < > 139   < > 135   < > 135 135 137  K 4.3   < > 4.2   < > 4.7   < > 4.5 3.9 3.8  CL 110  --  112*   < > 108   < > 106 103 100  CO2 11*  --  19*   < > 20*   < > 19* 23 27  GLUCOSE 136*  --  117*   < > 156*   < > 131* 139* 106*  BUN 33*  --  25*   < > 24*   < > 22* 17 22*  CREATININE 1.74*  --  1.62*   < > 1.80*   < > 1.66* 1.49* 1.68*  CALCIUM 7.6*  --  7.6*   < > 7.5*   < > 7.7* 7.8* 8.3*  PROT 5.7*  --  5.6*  --  5.8*  --   --   --   --  ALBUMIN 1.6*  --  1.5*   < > 1.5*   < > 1.5* 1.5* 1.6*  AST 59*  --  36  --  45*  --   --   --   --   ALT 86*  --  67*  --  64*  --   --   --   --   ALKPHOS 97  --  96  --  98  --   --   --   --   BILITOT 0.6  --  0.7  --  0.7  --   --   --   --   GFRNONAA 42*  --  46*   < > 40*   < > 44* 51* 44*  GFRAA 49*  --  53*   < > 47*   < > 52* 59* 51*  ANIONGAP 19*  --  8   < > 7   < > 10 9 10    < > = values in this interval not displayed.     Hematology Recent Labs  Lab 01/06/20 0453 01/07/20 0352 01/08/20 0335  WBC 14.8* 13.6* 12.2*  RBC 3.87* 4.21* 4.26  HGB 10.9* 11.6* 11.8*  HCT 31.1* 33.6* 34.2*  MCV 80.4 79.8* 80.3  MCH 28.2 27.6 27.7  MCHC 35.0 34.5 34.5  RDW 15.9* 15.6* 15.9*  PLT 287 339 395    BNP Recent Labs  Lab 01/06/20 0453  BNP 1,710.4*     DDimer No results for input(s): DDIMER in the last 168 hours.   Radiology    No results found.  Cardiac Studies   R/L HEART CATH 01/03/2020   Normal coronary arteries.  No evidence of left-to-right shunting by oximetry.  Mild elevation in pulmonary artery pressures.  Technically unable to obtain pulmonary capillary wedge pressure.  Normal left ventricular end-diastolic pressure.  RECOMMENDATIONS:   Resume anticoagulation  therapy for pulmonary embolism as soon as possible.  Fick Cardiac Output 5.15 L/min  Fick Cardiac Output Index 2.51 (L/min)/BSA  RA A Wave 17 mmHg  RA V Wave 16 mmHg  RA Mean 15 mmHg  RV Systolic Pressure 38 mmHg  RV Diastolic Pressure 12 mmHg  RV EDP 17 mmHg  PA Systolic Pressure 37 mmHg  PA Diastolic Pressure 20 mmHg  PA Mean 26 mmHg  PW A Wave 21 mmHg  PW V Wave 37 mmHg  PW Mean 26 mmHg  AO Systolic Pressure 123456 mmHg  AO Diastolic Pressure 78 mmHg  AO Mean 91 mmHg  LV Systolic Pressure [id number] mmHg  LV Diastolic Pressure 12 mmHg  LV EDP 16 mmHg  AOp Systolic Pressure 123456 mmHg  AOp Diastolic Pressure 79 mmHg  AOp Mean Pressure 91 mmHg  LVp Systolic Pressure 123XX123 mmHg  LVp Diastolic Pressure 13 mmHg  LVp EDP Pressure 16 mmHg  QP/QS 1.03  TPVR Index 10.35 HRUI  TSVR Index 36.23 HRUI  TPVR/TSVR Ratio 0.29   ECHO 04/25 and 04/26  1. Left ventricular ejection fraction, by estimation, is 35 to 40%. The  left ventricle has moderately decreased function. The left ventricle  demonstrates global hypokinesis. Left ventricular diastolic parameters are  consistent with Grade I diastolic  dysfunction (impaired relaxation). There is the interventricular septum is  flattened in systole and diastole, consistent with right ventricular  pressure and volume overload.  2. Right ventricular systolic function is moderately reduced.  3. Right atrial size was moderately dilated.  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 Trivial mitral valve regurgitation.  5. Unable to obtain adequate Doppler signal to estimate pulmonary  pressures. Tricuspid valve regurgitation is trivial.   1. Agitated saline contrast bubble study was positive with shunting  observed within 5 cardiac cycles suggestive of interatrial shunt.  2. Right ventricular systolic function is mildly reduced. The right  ventricular size is moderately enlarged.  3. Remainder of findings per transthoracic echocardiogram  12/30/19.   TEE 01/03/20 1. There is a smoke in the left ventricular apex with no evidence for a  thrombus on Definity echo contrast images.. Left ventricular ejection  fraction, by estimation, is 30 to 35%. The left ventricle has moderately  decreased function. The left ventricle  demonstrates global hypokinesis. There is the interventricular septum is  flattened in systole and diastole, consistent with right ventricular  pressure and volume overload.  2. Right ventricular systolic function is severely reduced. The right  ventricular size is severely enlarged. There is normal pulmonary artery  systolic pressure.  3. Left atrial size was moderately dilated. No left atrial/left atrial  appendage thrombus was detected.  4. Right atrial size was moderately dilated.  5. The mitral valve is normal in structure. Mild mitral valve  regurgitation. No evidence of mitral stenosis.  6. The aortic valve is normal in structure. Aortic valve regurgitation is  not visualized. No aortic stenosis is present.  7. The inferior vena cava is normal in size with greater than 50%  respiratory variability, suggesting right atrial pressure of 3 mmHg.  8. Agitated saline contrast bubble study was significantly positive with  shunting observed within the first cardiac cycles suggestive of  interatrial shunt. There is a large patent foramen ovale with  bidirectional shunting across atrial septum.   Conclusion(s)/Recommendation(s): Findings are concerning for an  interatrial shunt as detailed above.   Patient Profile     59 y.o. male with submassive pulmonary embolism and synchronous paradoxical embolic renal infarct, L femoral DVT, newly recognized left ventricular systolic dysfunction, markedly elevated cardiac enzymes c/w NSTEMI, normal coronary arteries by angio and suspicion of ASD.   Assessment & Plan    1.  Chronic systolic and diastolic heart failure -Echocardiogram showed LV function of  35 to 40% with global hypokinesis and grade 1 diastolic dysfunction.  Cath with normal coronaries.  Felt possible Takotsubo or nonischemic cardiomyopathy.  He was not fluid overloaded when last seen by Dr. Sallyanne Kuster 4/30. -Early morning of 5/2 patient had acute hypoxic respiratory failure requiring further high flow oxygen and eventually BiPAP.  Chest x-ray with pulmonary edema.  He was started on IV Lasix and discharged.  During acute hypoxic event he was briefly in atrial flutter RVR and then converted and then again went into a flutter RVR at rate of 120s -Switch to po lasix 40 mg daily today -Switched to Toprol XL 50 mg daily -Not on ACE or ARB due to AKI  2.  Paroxysmal atrial flutter with rapid ventricular rate -Had a brief atrial flutter with rapid ventricular rate episode on 5/2 that may have led to pulmonary edema.  He was cardioverted and then again went into a flutter with RVR with sustained rate of 120s -Monitor amiodarone 400 mg daily, BB switched and now rate control adequate -On Eliquis for anticoagulation  3. Paradoxical embolism/PFO - Plan to follow up with structural team as outpatient - No significant shunt on cath, however saline contrast did suggest bidirectional shunt - TEE showed shunting suggestive of interatrail shunt and large PFO with bidirectional shunt across atrial septum  4.  PE and DVT - Per primary team   5. NSTEMI - cath with normal coronaries  Close to d/c from a cardiac standpoint, possibly tomorrow - will likely still need home O2 until ASD is closed.  For questions or updates, please contact [place] Please consult [email or web address] for contact info under   Pixie Casino, MD, FACC, [place] Director of the [facility] Diplomate of the American Board of Clinical Lipidology Attending Cardiologist  Direct Dial: [phone]  Fax: [phone]  Website:   [email or web address]  Pixie Casino, MD  01/08/2020, 8:48 AM

## 2020-01-09 DIAGNOSIS — I4892 Unspecified atrial flutter: Secondary | ICD-10-CM

## 2020-01-09 DIAGNOSIS — I483 Typical atrial flutter: Secondary | ICD-10-CM

## 2020-01-09 LAB — RENAL FUNCTION PANEL
Albumin: 1.7 g/dL — ABNORMAL LOW (ref 3.5–5.0)
Anion gap: 8 (ref 5–15)
BUN: 27 mg/dL — ABNORMAL HIGH (ref 6–20)
CO2: 22 mmol/L (ref 22–32)
Calcium: 8.1 mg/dL — ABNORMAL LOW (ref 8.9–10.3)
Chloride: 103 mmol/L (ref 98–111)
Creatinine, Ser: 1.5 mg/dL — ABNORMAL HIGH (ref 0.61–1.24)
GFR calc Af Amer: 58 mL/min — ABNORMAL LOW (ref >60)
GFR calc non Af Amer: 50 mL/min — ABNORMAL LOW (ref >60)
Glucose, Bld: 133 mg/dL — ABNORMAL HIGH (ref 70–99)
Phosphorus: 2.7 mg/dL (ref 2.5–4.6)
Potassium: 4.6 mmol/L (ref 3.5–5.1)
Sodium: 133 mmol/L — ABNORMAL LOW (ref 135–145)

## 2020-01-09 LAB — BRAIN NATRIURETIC PEPTIDE: B Natriuretic Peptide: 1504.1 pg/mL — ABNORMAL HIGH (ref 0.0–100.0)

## 2020-01-09 LAB — MAGNESIUM: Magnesium: 2.2 mg/dL (ref 1.7–2.4)

## 2020-01-09 MED ORDER — FUROSEMIDE 40 MG PO TABS: 40.0000 mg | ORAL_TABLET | Freq: Every day | ORAL | 1 refills | Status: DC

## 2020-01-09 MED ORDER — APIXABAN 5 MG PO TABS
5.0000 mg | ORAL_TABLET | Freq: Two times a day (BID) | ORAL | 4 refills | Status: DC
Start: 2020-01-09 — End: 2020-11-21

## 2020-01-09 MED ORDER — POTASSIUM CHLORIDE CRYS ER 20 MEQ PO TBCR
40.0000 meq | EXTENDED_RELEASE_TABLET | Freq: Once | ORAL | Status: AC
  Administered 2020-01-09: 40 meq via ORAL
  Filled 2020-01-09: qty 2

## 2020-01-09 MED ORDER — APIXABAN 5 MG PO TABS
5.0000 mg | ORAL_TABLET | Freq: Two times a day (BID) | ORAL | 4 refills | Status: DC
Start: 2020-01-09 — End: 2020-01-09

## 2020-01-09 MED ORDER — AMIODARONE HCL 200 MG PO TABS: ORAL_TABLET | ORAL | 0 refills | Status: DC

## 2020-01-09 MED ORDER — APIXABAN (ELIQUIS) VTE STARTER PACK (10MG AND 5MG)
ORAL_TABLET | ORAL | 3 refills | Status: DC
Start: 2020-01-09 — End: 2020-11-21

## 2020-01-09 MED ORDER — METOPROLOL SUCCINATE ER 25 MG PO TB24: 75.0000 mg | ORAL_TABLET | Freq: Every day | ORAL | 1 refills | Status: DC

## 2020-01-09 MED ORDER — METOPROLOL SUCCINATE ER 50 MG PO TB24
75.0000 mg | ORAL_TABLET | Freq: Every day | ORAL | Status: DC
  Administered 2020-01-09 – 2020-01-11 (×3): 75 mg via ORAL
  Filled 2020-01-09 (×3): qty 1

## 2020-01-09 MED FILL — FUROSEMIDE 40 MG TABLET: 40 | 30 days supply | Qty: 30 | Fill #0

## 2020-01-09 MED FILL — METOPROLOL SUCCINATE ER 25: 25 | 30 days supply | Qty: 90 | Fill #0

## 2020-01-09 NOTE — Progress Notes (Signed)
SATURATION QUALIFICATIONS: (This note is used to comply with regulatory documentation for home oxygen)  Patient Saturations on Room Air at Rest = 91%  Patient Saturations on Room Air while Ambulating = 80%  Patient Saturations on 6 Liters of oxygen while Ambulating = 93%  Please briefly explain why patient needs home oxygen: Pt oxygen desaturates rapidly upon exertion/mobility on Room air. It took 6 Liters of oxygen via Nasal Canula to improve oxygen level to 93%.

## 2020-01-09 NOTE — Progress Notes (Signed)
PROGRESS NOTE    Cody Joyce  [id number] DOB: 1961/07/01 DOA: 12/29/2019 PCP: Patient, No Pcp Per   Brief Narrative: 59 year old with past medical history significant for COPD, chronic alcoholism and tobacco abuse who failed and hit his rib cage on 4/15, negative chest x-ray with since being less mobile. Patient presented to the ED on 12/29/2019 from home with complaint of shortness of breath, chest pain.  EMS noted oxygen saturation 60% improved in the ED on the nonrebreather mask.  CT angio chest showed large bilateral pulmonary emboli with evidence of right heart strain and an adherent mural thrombus along the anterior wall of the right ventricle with bilateral groundglass opacity likely developing pulmonary infarction, underlying emphysema.  CT abdomen pelvis showed multiple left renal cortical infarcts.  Patient was admitted to the critical care started on IV heparin drip.  Patient underwent thrombectomy of both pulmonary arteries by IR on 4/25.  Patient subsequently improved and was transferred to the hospital service on 4/26. Patient had a brief episode of a flutter on 4/27.  He was a started on IV amiodarone.  Subsequently transition to oral amiodarone.  Right and left heart cath on 4/28 with mild pulmonary hypertension but normal coronaries, LVEDP and no evidence of left to right shunt by oximetry.  TEE on 4/29 with LVEF of 30 to 35%, global hypokinesis, interventricular septal flattening, severely reduced RV SF and severely enlarged RV and large PFO with bidirectional shunting across the atrial septum but no thrombus.  Patient was cleared for discharge by cardiology with went into pulmonary edema with increased oxygen requirement.  He was a started on IVLexis on 5/2 and responded well.  Oxygen requirement has improved from 12 L high flow oxygen to 2 L today.  He was transitioned to oral Lasix.  His heart rate has increased , and Coreg was changed to metoprolol. She can require  home oxygen and [facility]   Assessment & Plan:   Principal Problem:   Aortic atherosclerosis (HCC) Active Problems:   COPD (chronic obstructive pulmonary disease) (HCC)   Alcohol dependence ([place])   Pulmonary embolism (HCC)   Renal infarct ([place])   Acute pulmonary embolism with acute cor pulmonale (HCC)   PFO (patent foramen ovale)   Acute systolic CHF (congestive heart failure) (HCC)   Non-STEMI (non-ST elevated myocardial infarction) (HCC)   Acute deep vein thrombosis (DVT) of left lower extremity (HCC)   Atrial flutter (HCC)  1-acute respiratory failure with hypoxia, multifactorial including PE, PFO shunting, new onset systolic heart failure, a flutter, possible non-STEMI Chest x-ray was concerning with pulmonary interstitial edema Patient was treated with IV Lasix Thrombectomy Continue with Eliquis  2-new acute combined CHF ejection fraction 35 to 40%, global hypokinesis, BNP more than 1000 Treated with IV Laxis initially.  Transition to oral lasix.  Requiring 2-3 L oxygen  3-Sub-Massive PE with acute cor pulmonale/left lower extremity, femoral-popliteal DVT: Multiple left renal cortical infarct paradoxical embolism in.  TEE revealed large PFO Patient underwent bilateral thrombectomy of both pulmonary arteries by IR on 4/25 Patient was a started on Eliquis Cardiology to arrange outpatient follow-up for history of structural heart disease  4-new onset A flutter: RVR Continue with amiodarone 400 mg for another week then 200 mg daily Metoprolol dose increased today Continue with Eliquis  5-non-STEMI right and left heart cath unrevealing.  Chest pain-free Now on metoprolol  6-AKI mild azotemia: Creatinine normal baseline.  On admission 1.9 .creatinine has been  stable at 1.6 7-elevated liver enzymes: Likely congestive hepatopathy.  Alcohol "play a role.  Resolving Hypomagnesemia repleted Hyperglycemia/prediabetes: A1c 6.4%. -Counsel on  lifestyle change including diet and exercise  COPD? -Continue as needed DuoNeb  Alcohol abuse: No withdrawal symptoms. -Encourage cessation  Tobacco use disorder -Encourage cessation.  Estimated body mass index is 24.1 kg/m as calculated from the following:   Height as of this encounter: 6' (1.829 m).   Weight as of this encounter: 80.6 kg.   DVT prophylaxis: Eliquis Code Status: Full code Family Communication: Care discussed directly with patient Disposition Plan:  Patient is from: Home Anticipated d/c date: 01/10/2020 discharge home Barriers to d/c or necessity for inpatient status: Patient is medical stable for discharge awaiting home oxygen  Consultants:   Cardiology  IR  PCCM  Procedures:  4/25-thrombectomy of both pulmonary arteries by IR. 4/28-R/LHC with normal coronaries and mildly elevated pulmonary artery pressures 4/29-TEE with LVEF of 30 to 35%, global hypokinesis, interventricular septum flattening, severely reduced RVSF and severely enlarged RV, large PFO with bidirectional shunting across the atrial septum but no thrombus  Antimicrobials:  None  Subjective: He denies worsening shortness of breath or chest pain.  Objective: Vitals:   01/08/20 2349 01/09/20 0448 01/09/20 0842 01/09/20 1158  BP: 105/78  106/87 (!) 107/97  Pulse: (!) 117  (!) 119 (!) 121  Resp:   20 (!) 24  Temp: 99 F (37.2 C)  97.9 F (36.6 C) 97.7 F (36.5 C)  TempSrc:      SpO2: 98%  100% 94%  Weight:  80.6 kg    Height:        Intake/Output Summary (Last 24 hours) at 01/09/2020 1607 Last data filed at 01/08/2020 2100 Gross per 24 hour  Intake 240 ml  Output 250 ml  Net -10 ml   Filed Weights   01/06/20 0500 01/08/20 0451 01/09/20 0448  Weight: 84.3 kg 79.5 kg 80.6 kg    Examination:  General exam: Appears calm and comfortable  Respiratory system: Clear to auscultation. Respiratory effort normal. Cardiovascular system: S1 & S2 heard, RRR. No JVD, murmurs, rubs,  gallops or clicks. No pedal edema. Gastrointestinal system: Abdomen is nondistended, soft and nontender. No organomegaly or masses felt. Normal bowel sounds heard. Central nervous system: Alert and oriented. No focal neurological deficits. Extremities: Symmetric 5 x 5 power. Skin: No rashes, lesions or ulcers Psychiatry: Judgement and insight appear normal. Mood & affect appropriate.     Data Reviewed: I have personally reviewed following labs and imaging studies  CBC: Recent Labs  Lab 01/04/20 0321 01/05/20 0256 01/06/20 0453 01/07/20 0352 01/08/20 0335  WBC 14.7* 13.9* 14.8* 13.6* 12.2*  HGB 11.2* 10.9* 10.9* 11.6* 11.8*  HCT 32.1* 31.1* 31.1* 33.6* 34.2*  MCV 81.1 79.5* 80.4 79.8* 80.3  PLT 206 246 287 339 [id number]   Basic Metabolic Panel: Recent Labs  Lab 01/04/20 0321 01/04/20 0321 01/05/20 0256 01/06/20 0453 01/07/20 0352 01/07/20 0919 01/08/20 0335 01/09/20 0350 01/09/20 0819  NA 135  --  135  --   --  135 137  --  133*  K 4.7  --  4.5  --   --  3.9 3.8  --  4.6  CL 108  --  106  --   --  103 100  --  103  CO2 20*  --  19*  --   --  23 27  --  22  GLUCOSE 156*  --  131*  --   --  139* 106*  --  133*  BUN 24*  --  22*  --   --  17 22*  --  27*  CREATININE 1.80*  --  1.66*  --   --  1.49* 1.68*  --  1.50*  CALCIUM 7.5*  --  7.7*  --   --  7.8* 8.3*  --  8.1*  MG 1.8   < > 1.8 1.8 1.7  --  1.6* 2.2  --   PHOS 2.1*  --  2.9  --   --  2.7 3.5  --  2.7   < > = values in this interval not displayed.   GFR: Estimated Creatinine Clearance: 58.2 mL/min (A) (by C-G formula based on SCr of 1.5 mg/dL (H)). Liver Function Tests: Recent Labs  Lab 01/03/20 0458 01/03/20 0458 01/04/20 0321 01/05/20 0256 01/07/20 0919 01/08/20 0335 01/09/20 0819  AST 36  --  45*  --   --   --   --   ALT 67*  --  64*  --   --   --   --   ALKPHOS 96  --  98  --   --   --   --   BILITOT 0.7  --  0.7  --   --   --   --   PROT 5.6*  --  5.8*  --   --   --   --   ALBUMIN 1.5*   < > 1.5*  1.5* 1.5* 1.6* 1.7*   < > = values in this interval not displayed.   No results for input(s): LIPASE, AMYLASE in the last 168 hours. No results for input(s): AMMONIA in the last 168 hours. Coagulation Profile: No results for input(s): INR, PROTIME in the last 168 hours. Cardiac Enzymes: No results for input(s): CKTOTAL, CKMB, CKMBINDEX, TROPONINI in the last 168 hours. BNP (last 3 results) No results for input(s): PROBNP in the last 8760 hours. HbA1C: No results for input(s): HGBA1C in the last 72 hours. CBG: Recent Labs  Lab 01/04/20 2110  GLUCAP 160*   Lipid Profile: No results for input(s): CHOL, HDL, LDLCALC, TRIG, CHOLHDL, LDLDIRECT in the last 72 hours. Thyroid Function Tests: No results for input(s): TSH, T4TOTAL, FREET4, T3FREE, THYROIDAB in the last 72 hours. Anemia Panel: No results for input(s): VITAMINB12, FOLATE, FERRITIN, TIBC, IRON, RETICCTPCT in the last 72 hours. Sepsis Labs: No results for input(s): PROCALCITON, LATICACIDVEN in the last 168 hours.  No results found for this or any previous visit (from the past 240 hour(s)).       Radiology Studies: No results found.      Scheduled Meds: . amiodarone  400 mg Oral Daily  . apixaban   Does not apply Once  . apixaban  10 mg Oral BID   Followed by  . [START ON 01/10/2020] apixaban  5 mg Oral BID  . furosemide  40 mg Oral Daily  . metoprolol succinate  75 mg Oral Daily  . pantoprazole  40 mg Oral Daily  . sodium chloride flush  3 mL Intravenous Q12H   Continuous Infusions: . sodium chloride       LOS: 11 days    Time spent: 35 minutes    Sharlot Sturkey A Nayel Purdy, MD [facility]   If 7PM-7AM, please contact night-coverage [email or web address]  01/09/2020, 4:07 PM

## 2020-01-09 NOTE — Progress Notes (Addendum)
Progress Note  Patient Name: Cody Joyce Date of Encounter: 01/09/2020  Primary Cardiologist: Sanda Klein, MD   Subjective   Diuresis has slowed, now net even - transitioned to oral lasix. Issue with atrial flutter overnight - HR now up to the 120's at times. Magnesium 2.2 today after repletion. He is also on Toprol XL 50 mg daily. Oxygen requirement is way less- now on 2L [place] - sats are 93% at rest.  Inpatient Medications    Scheduled Meds: . amiodarone  400 mg Oral Daily  . apixaban   Does not apply Once  . apixaban  10 mg Oral BID   Followed by  . [START ON 01/10/2020] apixaban  5 mg Oral BID  . furosemide  40 mg Oral Daily  . metoprolol succinate  50 mg Oral Daily  . pantoprazole  40 mg Oral Daily  . sodium chloride flush  3 mL Intravenous Q12H   Continuous Infusions: . sodium chloride     PRN Meds: sodium chloride, acetaminophen, alum & mag hydroxide-simeth, docusate sodium, hydrocortisone, ipratropium-albuterol, metoprolol tartrate, ondansetron (ZOFRAN) IV, oxyCODONE, polyethylene glycol, sodium chloride flush   Vital Signs    Vitals:   01/08/20 0802 01/08/20 1556 01/08/20 2349 01/09/20 0448  BP:  113/77 105/78   Pulse:  76 (!) 117   Resp: 18 16    Temp:  98.6 F (37 C) 99 F (37.2 C)   TempSrc:      SpO2: 94% 100% 98%   Weight:    80.6 kg  Height:        Intake/Output Summary (Last 24 hours) at 01/09/2020 0823 Last data filed at 01/08/2020 2100 Gross per 24 hour  Intake 240 ml  Output 250 ml  Net -10 ml   Last 3 Weights 01/09/2020 01/08/2020 01/06/2020  Weight (lbs) 177 lb 11.1 oz 175 lb 4.3 oz 185 lb 13.6 oz  Weight (kg) 80.6 kg 79.5 kg 84.3 kg      Telemetry    Atrial flutter - rates up to the 120's- Personally Reviewed  ECG    No new tracing  Physical Exam   GEN: No acute distress.   Neck: NO JVP Cardiac:  irregularly irregular, tachycardic Respiratory: lungs clear GI: Soft, nontender, non-distended  MS: No edema; No deformity. Neuro:   Nonfocal  Psych: Normal affect   Labs    High Sensitivity Troponin:   Recent Labs  Lab 12/31/19 1858 12/31/19 2258 01/01/20 0254 01/01/20 0555 01/01/20 0715  TROPONINIHS 3,000* 7,544* 11,131* 11,154* 10,744*      Chemistry Recent Labs  Lab 01/03/20 [id number] 01/03/20 [id number] 01/04/20 0321 01/04/20 0321 01/05/20 0256 01/07/20 0919 01/08/20 0335  NA 139   < > 135   < > 135 135 137  K 4.2   < > 4.7   < > 4.5 3.9 3.8  CL 112*   < > 108   < > 106 103 100  CO2 19*   < > 20*   < > 19* 23 27  GLUCOSE 117*   < > 156*   < > 131* 139* 106*  BUN 25*   < > 24*   < > 22* 17 22*  CREATININE 1.62*   < > 1.80*   < > 1.66* 1.49* 1.68*  CALCIUM 7.6*   < > 7.5*   < > 7.7* 7.8* 8.3*  PROT 5.6*  --  5.8*  --   --   --   --   ALBUMIN 1.5*   < >  1.5*   < > 1.5* 1.5* 1.6*  AST 36  --  45*  --   --   --   --   ALT 67*  --  64*  --   --   --   --   ALKPHOS 96  --  98  --   --   --   --   BILITOT 0.7  --  0.7  --   --   --   --   GFRNONAA 46*   < > 40*   < > 44* 51* 44*  GFRAA 53*   < > 47*   < > 52* 59* 51*  ANIONGAP 8   < > 7   < > 10 9 10    < > = values in this interval not displayed.     Hematology Recent Labs  Lab 01/06/20 0453 01/07/20 0352 01/08/20 0335  WBC 14.8* 13.6* 12.2*  RBC 3.87* 4.21* 4.26  HGB 10.9* 11.6* 11.8*  HCT 31.1* 33.6* 34.2*  MCV 80.4 79.8* 80.3  MCH 28.2 27.6 27.7  MCHC 35.0 34.5 34.5  RDW 15.9* 15.6* 15.9*  PLT 287 339 395    BNP Recent Labs  Lab 01/06/20 0453 01/09/20 0350  BNP 1,710.4* 1,504.1*     DDimer No results for input(s): DDIMER in the last 168 hours.   Radiology    No results found.  Cardiac Studies   R/L HEART CATH 01/03/2020   Normal coronary arteries.  No evidence of left-to-right shunting by oximetry.  Mild elevation in pulmonary artery pressures.  Technically unable to obtain pulmonary capillary wedge pressure.  Normal left ventricular end-diastolic pressure.  RECOMMENDATIONS:   Resume anticoagulation therapy for  pulmonary embolism as soon as possible.  Fick Cardiac Output 5.15 L/min  Fick Cardiac Output Index 2.51 (L/min)/BSA  RA A Wave 17 mmHg  RA V Wave 16 mmHg  RA Mean 15 mmHg  RV Systolic Pressure 38 mmHg  RV Diastolic Pressure 12 mmHg  RV EDP 17 mmHg  PA Systolic Pressure 37 mmHg  PA Diastolic Pressure 20 mmHg  PA Mean 26 mmHg  PW A Wave 21 mmHg  PW V Wave 37 mmHg  PW Mean 26 mmHg  AO Systolic Pressure 123456 mmHg  AO Diastolic Pressure 78 mmHg  AO Mean 91 mmHg  LV Systolic Pressure [id number] mmHg  LV Diastolic Pressure 12 mmHg  LV EDP 16 mmHg  AOp Systolic Pressure 123456 mmHg  AOp Diastolic Pressure 79 mmHg  AOp Mean Pressure 91 mmHg  LVp Systolic Pressure 123XX123 mmHg  LVp Diastolic Pressure 13 mmHg  LVp EDP Pressure 16 mmHg  QP/QS 1.03  TPVR Index 10.35 HRUI  TSVR Index 36.23 HRUI  TPVR/TSVR Ratio 0.29   ECHO 04/25 and 04/26  1. Left ventricular ejection fraction, by estimation, is 35 to 40%. The  left ventricle has moderately decreased function. The left ventricle  demonstrates global hypokinesis. Left ventricular diastolic parameters are  consistent with Grade I diastolic  dysfunction (impaired relaxation). There is the interventricular septum is  flattened in systole and diastole, consistent with right ventricular  pressure and volume overload.  2. Right ventricular systolic function is moderately reduced.  3. Right atrial size was moderately dilated.  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 Trivial mitral valve regurgitation.  5. Unable to obtain adequate Doppler signal to estimate pulmonary  pressures. Tricuspid valve regurgitation is trivial.   1. Agitated saline contrast bubble study was positive with shunting  observed within 5 cardiac cycles  suggestive of interatrial shunt.  2. Right ventricular systolic function is mildly reduced. The right  ventricular size is moderately enlarged.  3. Remainder of findings per transthoracic echocardiogram 12/30/19.   TEE  01/03/20 1. There is a smoke in the left ventricular apex with no evidence for a  thrombus on Definity echo contrast images.. Left ventricular ejection  fraction, by estimation, is 30 to 35%. The left ventricle has moderately  decreased function. The left ventricle  demonstrates global hypokinesis. There is the interventricular septum is  flattened in systole and diastole, consistent with right ventricular  pressure and volume overload.  2. Right ventricular systolic function is severely reduced. The right  ventricular size is severely enlarged. There is normal pulmonary artery  systolic pressure.  3. Left atrial size was moderately dilated. No left atrial/left atrial  appendage thrombus was detected.  4. Right atrial size was moderately dilated.  5. The mitral valve is normal in structure. Mild mitral valve  regurgitation. No evidence of mitral stenosis.  6. The aortic valve is normal in structure. Aortic valve regurgitation is  not visualized. No aortic stenosis is present.  7. The inferior vena cava is normal in size with greater than 50%  respiratory variability, suggesting right atrial pressure of 3 mmHg.  8. Agitated saline contrast bubble study was significantly positive with  shunting observed within the first cardiac cycles suggestive of  interatrial shunt. There is a large patent foramen ovale with  bidirectional shunting across atrial septum.   Conclusion(s)/Recommendation(s): Findings are concerning for an  interatrial shunt as detailed above.   Patient Profile     59 y.o. male with submassive pulmonary embolism and synchronous paradoxical embolic renal infarct, L femoral DVT, newly recognized left ventricular systolic dysfunction, markedly elevated cardiac enzymes c/w NSTEMI, normal coronary arteries by angio and suspicion of ASD.   Assessment & Plan    1.  Chronic systolic and diastolic heart failure -Echocardiogram showed LV function of 35 to 40% with  global hypokinesis and grade 1 diastolic dysfunction.  Cath with normal coronaries.  Felt possible Takotsubo or nonischemic cardiomyopathy.  He was not fluid overloaded when last seen by Dr. Sallyanne Kuster 4/30. -Early morning of 5/2 patient had acute hypoxic respiratory failure requiring further high flow oxygen and eventually BiPAP.  Chest x-ray with pulmonary edema.  He was started on IV Lasix and discharged.  During acute hypoxic event he was briefly in atrial flutter RVR and then converted and then again went into a flutter RVR at rate of 120s -Switch to po lasix 40 mg daily -Switched to Toprol XL 50 mg daily, increase to 75 mg Daily -Not on ACE or ARB due to AKI  2.  Paroxysmal atrial flutter with rapid ventricular rate -Had a brief atrial flutter with rapid ventricular rate episode on 5/2 that may have led to pulmonary edema.  He was cardioverted and then again went into a flutter with RVR with sustained rate of 120s -HR elevated again, continue amiodarone to 400 mg daily. Increase Toprol, monitor BP as it is soft. -On Eliquis for anticoagulation  3. Paradoxical embolism/PFO - Plan to follow up with structural team as outpatient - No significant shunt on cath, however saline contrast did suggest bidirectional shunt - TEE showed shunting suggestive of interatrail shunt and large PFO with bidirectional shunt across atrial septum  4. PE and DVT - Per primary team   5. NSTEMI - cath with normal coronaries  6. Electrolytes: Magnesium repleted yesterday to 2.2. No BMET  today- K yesterday was 3.8, would like to keep >4.  Give additional 40 MEQ potassium today.  Would like to d/c from a cardiac standpoint possibly later today or early tomorrow if rate control improved- will likely still need home O2 until ASD is closed. Has structural heart follow-up appt tomorrow with Dr. Burt Knack.  For questions or updates, please contact [place] Please consult [email or web address] for contact info under    Pixie Casino, MD, FACC, [place] Director of the [facility] Diplomate of the American Board of Clinical Lipidology Attending Cardiologist  Direct Dial: [phone]  Fax: [phone]  Website:  [email or web address]  Pixie Casino, MD  01/09/2020, 8:23 AM

## 2020-01-09 NOTE — Progress Notes (Addendum)
Patient needs 6 L of oxygen on ambulation.   Niel Hummer, MD.

## 2020-01-09 NOTE — Progress Notes (Signed)
Physical Therapy Treatment Patient Details Name: Cody Joyce MRN: [id number] DOB: 02/07/1961 Today's Date: 01/09/2020    History of Present Illness 59 y.o. M with PMH of tobacco use, alcohol use and emphysema who had a tree branch fall on his right rib-cage on 4/15. He had a CXR which showed no fracture, but since then has not been moving much due to pain.  Found to have submassive PE. Pt also states he tore is R gastroc and quad but no evidence of this in chart review or symptoms. Pt also with LLE DVT. Pt is s/p  thrombectomy of both pulmonary arteries by IR on 4/25. Pt is s/p TEE that showed severely enlarged RV and large PFO with bidirectional shunting across the atrial septum but no thrombus.     PT Comments    Pt progressing well with mobility, ambulating great hallway distance with no evidence of unsteadiness or functional weakness. Pt took 2 standing rest breaks to recover dyspnea and monitor SpO2, pt with SpO2 of 92-93% on 2LO2 during mobility. Pt will need home O2 to maintain sats at appropriate level for mobility. Pt educated on importance of breathing technique, specifically pursed lip breathing, and taking rests as needed to recover work of breathing. Pt progressing well and is eager to d/c home with assist of his wife. PT to continue to follow acutely.   Follow Up Recommendations  No PT follow up     Equipment Recommendations  None recommended by PT    Recommendations for Other Services       Precautions / Restrictions Precautions Precautions: Other (comment) Precaution Comments: watch SpO2, HR Restrictions Weight Bearing Restrictions: No    Mobility  Bed Mobility Overal bed mobility: Modified Independent             General bed mobility comments: increased time  Transfers Overall transfer level: Modified independent               General transfer comment: mod I for use of bedrails and Ues to push up to stand  Ambulation/Gait Ambulation/Gait  assistance: Supervision Gait Distance (Feet): 350 Feet Assistive device: None Gait Pattern/deviations: Step-through pattern;Decreased stride length;Trunk flexed Gait velocity: decr   General Gait Details: supervision for safety, pt with cautious gait speed but no evidence of unsteadiness or imbalance. Pause breaks throughout gait for SpO2 checks on 2LO2, 93% and greater on 2LO2 with DOE 2/4 noted.   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      Exercises Other Exercises Other Exercises: pursed lip breathing    General Comments        Pertinent  Vitals/Pain Pain Assessment: No/denies pain    Home Living                      Prior Function            PT Goals (current goals can now be found in the care plan section) Acute Rehab PT Goals Patient Stated Goal: return to independence, have lungs heal PT Goal Formulation: With patient Time For Goal Achievement: 01/18/20 Potential to Achieve Goals: Good Progress towards PT goals: Progressing toward goals    Frequency    Min 3X/week      PT Plan Current plan remains appropriate    Co-evaluation              AM-PAC PT "6 Clicks" Mobility   Outcome Measure  Help needed turning from your back to your side while in a flat bed without using bedrails?: None Help needed moving from  lying on your back to sitting on the side of a flat bed without using bedrails?: None Help needed moving to and from a bed to a chair (including a wheelchair)?: None Help needed standing up from a chair using your arms (e.g., wheelchair or bedside chair)?: None Help needed to walk in hospital room?: None Help needed climbing 3-5 steps with a railing? : A Little 6 Click Score: 23    End of Session Equipment Utilized During Treatment: Oxygen Activity Tolerance: Patient tolerated treatment well Patient left: in bed;with call bell/phone within reach Nurse Communication: Mobility status PT Visit Diagnosis: Other abnormalities of gait and mobility (R26.89)     Time: [id number] PT Time Calculation (min) (ACUTE ONLY): 19 min  Charges:  $Gait Training: 8-22 mins                     [name] E, PT Acute Rehabilitation Services Pager [phone]  Office [phone]   [name] 01/09/2020, 1:20 PM

## 2020-01-10 LAB — RENAL FUNCTION PANEL
Albumin: 1.7 g/dL — ABNORMAL LOW (ref 3.5–5.0)
Anion gap: 12 (ref 5–15)
BUN: 30 mg/dL — ABNORMAL HIGH (ref 6–20)
CO2: 19 mmol/L — ABNORMAL LOW (ref 22–32)
Calcium: 8.2 mg/dL — ABNORMAL LOW (ref 8.9–10.3)
Chloride: 104 mmol/L (ref 98–111)
Creatinine, Ser: 1.47 mg/dL — ABNORMAL HIGH (ref 0.61–1.24)
GFR calc Af Amer: 60 mL/min — ABNORMAL LOW (ref >60)
GFR calc non Af Amer: 51 mL/min — ABNORMAL LOW (ref >60)
Glucose, Bld: 110 mg/dL — ABNORMAL HIGH (ref 70–99)
Phosphorus: 3.3 mg/dL (ref 2.5–4.6)
Potassium: 5 mmol/L (ref 3.5–5.1)
Sodium: 135 mmol/L (ref 135–145)

## 2020-01-10 LAB — MAGNESIUM: Magnesium: 1.9 mg/dL (ref 1.7–2.4)

## 2020-01-10 NOTE — Plan of Care (Signed)
met 

## 2020-01-10 NOTE — Progress Notes (Signed)
Progress Note  Patient Name: Cody Joyce Date of Encounter: 01/10/2020  Primary Cardiologist: Sanda Klein, MD   Subjective   No issues overnight - HR controlled with atrial flutter/variable rate. Was -750 ml overnight. Creatinine is stable.   Inpatient Medications    Scheduled Meds: . amiodarone  400 mg Oral Daily  . apixaban   Does not apply Once  . apixaban  10 mg Oral BID   Followed by  . apixaban  5 mg Oral BID  . furosemide  40 mg Oral Daily  . metoprolol succinate  75 mg Oral Daily  . pantoprazole  40 mg Oral Daily  . sodium chloride flush  3 mL Intravenous Q12H   Continuous Infusions: . sodium chloride     PRN Meds: sodium chloride, acetaminophen, alum & mag hydroxide-simeth, docusate sodium, hydrocortisone, ipratropium-albuterol, metoprolol tartrate, ondansetron (ZOFRAN) IV, oxyCODONE, polyethylene glycol, sodium chloride flush   Vital Signs    Vitals:   01/09/20 1611 01/09/20 1818 01/09/20 2204 01/10/20 0449  BP: 109/78 122/88 114/80   Pulse: (!) 124 (!) 117 (!) 108   Resp: 19 19 17    Temp: 98.4 F (36.9 C) 98.3 F (36.8 C) 97.9 F (36.6 C)   TempSrc:  Oral Oral   SpO2: 98% 97% 98%   Weight:    80.3 kg  Height:        Intake/Output Summary (Last 24 hours) at 01/10/2020 0828 Last data filed at 01/10/2020 0400 Gross per 24 hour  Intake --  Output 750 ml  Net -750 ml   Last 3 Weights 01/10/2020 01/09/2020 01/08/2020  Weight (lbs) 177 lb 0.5 oz 177 lb 11.1 oz 175 lb 4.3 oz  Weight (kg) 80.3 kg 80.6 kg 79.5 kg      Telemetry    Atrial flutter - rates up to the 120's- Personally Reviewed  ECG    No new tracing  Physical Exam   GEN: No acute distress.   Neck: NO JVP Cardiac:  irregularly irregular, tachycardic Respiratory: lungs clear GI: Soft, nontender, non-distended  MS: No edema; No deformity. Neuro:  Nonfocal  Psych: Normal affect   Labs    High Sensitivity Troponin:   Recent Labs  Lab 12/31/19 1858 12/31/19 2258 01/01/20 0254  01/01/20 0555 01/01/20 0715  TROPONINIHS 3,000* 7,544* 11,131* 11,154* 10,744*      Chemistry Recent Labs  Lab 01/04/20 0321 01/05/20 0256 01/08/20 0335 01/09/20 0819 01/10/20 0308  NA 135   < > 137 133* 135  K 4.7   < > 3.8 4.6 5.0  CL 108   < > 100 103 104  CO2 20*   < > 27 22 19*  GLUCOSE 156*   < > 106* 133* 110*  BUN 24*   < > 22* 27* 30*  CREATININE 1.80*   < > 1.68* 1.50* 1.47*  CALCIUM 7.5*   < > 8.3* 8.1* 8.2*  PROT 5.8*  --   --   --   --   ALBUMIN 1.5*   < > 1.6* 1.7* 1.7*  AST 45*  --   --   --   --   ALT 64*  --   --   --   --   ALKPHOS 98  --   --   --   --   BILITOT 0.7  --   --   --   --   GFRNONAA 40*   < > 44* 50* 51*  GFRAA 47*   < >  51* 58* 60*  ANIONGAP 7   < > 10 8 12    < > = values in this interval not displayed.     Hematology Recent Labs  Lab 01/06/20 0453 01/07/20 0352 01/08/20 0335  WBC 14.8* 13.6* 12.2*  RBC 3.87* 4.21* 4.26  HGB 10.9* 11.6* 11.8*  HCT 31.1* 33.6* 34.2*  MCV 80.4 79.8* 80.3  MCH 28.2 27.6 27.7  MCHC 35.0 34.5 34.5  RDW 15.9* 15.6* 15.9*  PLT 287 339 395    BNP Recent Labs  Lab 01/06/20 0453 01/09/20 0350  BNP 1,710.4* 1,504.1*     DDimer No results for input(s): DDIMER in the last 168 hours.   Radiology    No results found.  Cardiac Studies   R/L HEART CATH 01/03/2020   Normal coronary arteries.  No evidence of left-to-right shunting by oximetry.  Mild elevation in pulmonary artery pressures.  Technically unable to obtain pulmonary capillary wedge pressure.  Normal left ventricular end-diastolic pressure.  RECOMMENDATIONS:   Resume anticoagulation therapy for pulmonary embolism as soon as possible.  Fick Cardiac Output 5.15 L/min  Fick Cardiac Output Index 2.51 (L/min)/BSA  RA A Wave 17 mmHg  RA V Wave 16 mmHg  RA Mean 15 mmHg  RV Systolic Pressure 38 mmHg  RV Diastolic Pressure 12 mmHg  RV EDP 17 mmHg  PA Systolic Pressure 37 mmHg  PA Diastolic Pressure 20 mmHg  PA Mean 26  mmHg  PW A Wave 21 mmHg  PW V Wave 37 mmHg  PW Mean 26 mmHg  AO Systolic Pressure 123456 mmHg  AO Diastolic Pressure 78 mmHg  AO Mean 91 mmHg  LV Systolic Pressure [id number] mmHg  LV Diastolic Pressure 12 mmHg  LV EDP 16 mmHg  AOp Systolic Pressure 123456 mmHg  AOp Diastolic Pressure 79 mmHg  AOp Mean Pressure 91 mmHg  LVp Systolic Pressure 123XX123 mmHg  LVp Diastolic Pressure 13 mmHg  LVp EDP Pressure 16 mmHg  QP/QS 1.03  TPVR Index 10.35 HRUI  TSVR Index 36.23 HRUI  TPVR/TSVR Ratio 0.29   ECHO 04/25 and 04/26  1. Left ventricular ejection fraction, by estimation, is 35 to 40%. The  left ventricle has moderately decreased function. The left ventricle  demonstrates global hypokinesis. Left ventricular diastolic parameters are  consistent with Grade I diastolic  dysfunction (impaired relaxation). There is the interventricular septum is  flattened in systole and diastole, consistent with right ventricular  pressure and volume overload.  2. Right ventricular systolic function is moderately reduced.  3. Right atrial size was moderately dilated.  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 Trivial mitral valve regurgitation.  5. Unable to obtain adequate Doppler signal to estimate pulmonary  pressures. Tricuspid valve regurgitation is trivial.   1. Agitated saline contrast bubble study was positive with shunting  observed within 5 cardiac cycles suggestive of interatrial shunt.  2. Right ventricular systolic function is mildly reduced. The right  ventricular size is moderately enlarged.  3. Remainder of findings per transthoracic echocardiogram 12/30/19.   TEE 01/03/20 1. There is a smoke in the left ventricular apex with no evidence for a  thrombus on Definity echo contrast images.. Left ventricular ejection  fraction, by estimation, is 30 to 35%. The left ventricle has moderately  decreased function. The left ventricle  demonstrates global hypokinesis. There is the interventricular  septum is  flattened in systole and diastole, consistent with right ventricular  pressure and volume overload.  2. Right ventricular systolic function is severely reduced. The right  ventricular size is severely enlarged. There is normal pulmonary artery  systolic pressure.  3. Left atrial size was moderately dilated. No left atrial/left atrial  appendage thrombus was detected.  4. Right atrial size was moderately dilated.  5. The mitral valve is normal in structure. Mild mitral valve  regurgitation. No evidence of mitral stenosis.  6. The aortic valve is normal in structure. Aortic valve regurgitation is  not visualized. No aortic stenosis is present.  7. The inferior vena cava is normal in size with greater than 50%  respiratory variability, suggesting right atrial pressure of 3 mmHg.  8. Agitated saline contrast bubble study was significantly positive with  shunting observed within the first cardiac cycles suggestive of  interatrial shunt. There is a large patent foramen ovale with  bidirectional shunting across atrial septum.   Conclusion(s)/Recommendation(s): Findings are concerning for an  interatrial shunt as detailed above.   Patient Profile     59 y.o. male with submassive pulmonary embolism and synchronous paradoxical embolic renal infarct, L femoral DVT, newly recognized left ventricular systolic dysfunction, markedly elevated cardiac enzymes c/w NSTEMI, normal coronary arteries by angio and suspicion of ASD.   Assessment & Plan    1.  Chronic systolic and diastolic heart failure -Echocardiogram showed LV function of 35 to 40% with global hypokinesis and grade 1 diastolic dysfunction.  Cath with normal coronaries.  Felt possible Takotsubo or nonischemic cardiomyopathy.  He was not fluid overloaded when last seen by Dr. Sallyanne Kuster 4/30. -Early morning of 5/2 patient had acute hypoxic respiratory failure requiring further high flow oxygen and eventually BiPAP.  Chest  x-ray with pulmonary edema.  He was started on IV Lasix and discharged.  During acute hypoxic event he was briefly in atrial flutter RVR and then converted and then again went into a flutter RVR at rate of 120s -Continue lasix 40 mg daily -Continue Toprol XL 75 mg Daily -Not on ACE or ARB due to AKI  2.  Paroxysmal atrial flutter with rapid ventricular rate -Had a brief atrial flutter with rapid ventricular rate episode on 5/2 that may have led to pulmonary edema.  He was cardioverted and then again went into a flutter with RVR with sustained rate of 120s -HR control improved overnight -On Eliquis for anticoagulation  3. Paradoxical embolism/PFO - Plan to follow up with structural team as outpatient - No significant shunt on cath, however saline contrast did suggest bidirectional shunt - TEE showed shunting suggestive of interatrail shunt and large PFO with bidirectional shunt across atrial septum - Outpatient follow-up tomorrow to discuss PFO closure with Dr. Burt Knack  4. PE and DVT - Per primary team   5. NSTEMI - cath with normal coronaries  6. Electrolytes: Keep K>4.5, Mg >2.0.  Awaiting oxygen setup for home, but ok to d/c home today from cardiac standpoint.  For questions or updates, please contact [place] Please consult [email or web address] for contact info under   Pixie Casino, MD, FACC, [place] Director of the [facility] Diplomate of the American Board of Clinical Lipidology Attending Cardiologist  Direct Dial: [phone]  Fax: [phone]  Website:  [email or web address]  Pixie Casino, MD  01/10/2020, 8:28 AM

## 2020-01-10 NOTE — Progress Notes (Signed)
Patient reports blood with bowel movement that is more than what he usually sees with hemorrhoids. RN will continue to monitor.

## 2020-01-10 NOTE — Progress Notes (Signed)
Occupational Therapy Treatment and Discharge Patient Details Name: Cody Joyce MRN: [id number] DOB: 18-Mar-1961 Today's Date: 01/10/2020    History of present illness 59 y.o. M with PMH of tobacco use, alcohol use and emphysema who had a tree branch fall on his right rib-cage on 4/15. He had a CXR which showed no fracture, but since then has not been moving much due to pain.  Found to have submassive PE. Pt also states he tore is R gastroc and quad but no evidence of this in chart review or symptoms. Pt also with LLE DVT. Pt is s/p  thrombectomy of both pulmonary arteries by IR on 4/25. Pt is s/p TEE that showed severely enlarged RV and large PFO with bidirectional shunting across the atrial septum but no thrombus.    OT comments  Pt is functioning modified independently in ADL and mobility on 2L 02 and verbalized/demonstrated understanding of pursed lip breathing and energy conservation. No further OT needs.  Follow Up Recommendations  No OT follow up;Supervision - Intermittent    Equipment Recommendations  Tub/shower seat    Recommendations for Other Services      Precautions / Restrictions Precautions Precautions: Other (comment) Precaution Comments: watch SpO2, HR       Mobility Bed Mobility Overal bed mobility: Modified Independent             General bed mobility comments: HOB up  Transfers Overall transfer level: Independent                    Balance                                           ADL either performed or assessed with clinical judgement   ADL Overall ADL's : Modified independent                                       General ADL Comments: Completed education in energy conservation and breathing techniques with pt verbalizing understanding.     Vision       Perception     Praxis      Cognition Arousal/Alertness: Awake/alert Behavior During Therapy: WFL for tasks assessed/performed Overall  Cognitive Status: Within Functional Limits for tasks assessed                                          Exercises     Shoulder Instructions       General Comments      Pertinent Vitals/ Pain       Pain Assessment: No/denies pain  Home Living                                          Prior Functioning/Environment              Frequency           Progress Toward Goals  OT Goals(current goals can now be found in the care plan section)  Progress towards OT goals: Goals met/education completed, patient discharged from OT  Acute Rehab OT Goals Patient Stated Goal:  return to independence, have lungs heal OT Goal Formulation: With patient  Plan All goals met and education completed, patient discharged from OT services    Co-evaluation                 AM-PAC OT "6 Clicks" Daily Activity     Outcome Measure   Help from another person eating meals?: None Help from another person taking care of personal grooming?: None Help from another person toileting, which includes using toliet, bedpan, or urinal?: None Help from another person bathing (including washing, rinsing, drying)?: None Help from another person to put on and taking off regular upper body clothing?: None Help from another person to put on and taking off regular lower body clothing?: None 6 Click Score: 24    End of Session Equipment Utilized During Treatment: Oxygen(2L)  OT Visit Diagnosis: (decreased activity tolerance)   Activity Tolerance Treatment limited secondary to medical complications (Comment)(HR elevates to 120 with minimal exertion, 111 at rest)   Patient Left in bed;with call bell/phone within reach   Nurse Communication          Time: 2458-0998 OT Time Calculation (min): 13 min  Charges: OT General Charges $OT Visit: 1 Visit OT Treatments $Self Care/Home Management : 8-22 mins  [name], OTR/L Acute Rehabilitation Services Pager:  [phone] Office: [phone]   [name] 01/10/2020, 12:15 PM

## 2020-01-10 NOTE — Progress Notes (Signed)
PROGRESS NOTE    Cody Joyce  [id number] DOB: Jun 03, 1961 DOA: 12/29/2019 PCP: Patient, No Pcp Per   Brief Narrative: 59 year old with past medical history significant for COPD, chronic alcoholism and tobacco abuse who failed and hit his rib cage on 4/15, negative chest x-ray with since being less mobile. Patient presented to the ED on 12/29/2019 from home with complaint of shortness of breath, chest pain.  EMS noted oxygen saturation 60% improved in the ED on the nonrebreather mask.  CT angio chest showed large bilateral pulmonary emboli with evidence of right heart strain and an adherent mural thrombus along the anterior wall of the right ventricle with bilateral groundglass opacity likely developing pulmonary infarction, underlying emphysema.  CT abdomen pelvis showed multiple left renal cortical infarcts.  Patient was admitted to the critical care started on IV heparin drip.  Patient underwent thrombectomy of both pulmonary arteries by IR on 4/25.  Patient subsequently improved and was transferred to the hospital service on 4/26. Patient had a brief episode of a flutter on 4/27.  He was a started on IV amiodarone.  Subsequently transition to oral amiodarone.  Right and left heart cath on 4/28 with mild pulmonary hypertension but normal coronaries, LVEDP and no evidence of left to right shunt by oximetry.  TEE on 4/29 with LVEF of 30 to 35%, global hypokinesis, interventricular septal flattening, severely reduced RV SF and severely enlarged RV and large PFO with bidirectional shunting across the atrial septum but no thrombus.  Patient was cleared for discharge by cardiology with went into pulmonary edema with increased oxygen requirement.  He was a started on IVLexis on 5/2 and responded well.  Oxygen requirement has improved from 12 L high flow oxygen to 2 L today.  He was transitioned to oral Lasix.  His heart rate has increased , and Coreg was changed to metoprolol. She can require  home oxygen and [facility]   Assessment & Plan:   Principal Problem:   Aortic atherosclerosis (HCC) Active Problems:   COPD (chronic obstructive pulmonary disease) (HCC)   Alcohol dependence ([place])   Pulmonary embolism (HCC)   Renal infarct ([place])   Acute pulmonary embolism with acute cor pulmonale (HCC)   PFO (patent foramen ovale)   Acute systolic CHF (congestive heart failure) (HCC)   Non-STEMI (non-ST elevated myocardial infarction) (HCC)   Acute deep vein thrombosis (DVT) of left lower extremity (HCC)   Atrial flutter (HCC)  1-Acute respiratory failure with hypoxia, multifactorial including PE, PFO shunting, new onset systolic heart failure, a flutter, possible non-STEMI Chest x-ray was concerning with pulmonary interstitial edema Patient was treated with IV Lasix Thrombectomy Continue with Eliquis  2-New acute combined CHF ejection fraction 35 to 40%, global hypokinesis, BNP more than 1000 Treated with IV Laxis initially.  Transition to oral lasix.  Requiring 2 L oxygen on ambulation needs 6 L.   3-Sub-Massive PE with acute cor pulmonale/left lower extremity, femoral-popliteal DVT: Multiple left renal cortical infarct paradoxical embolism in.  TEE revealed large PFO Patient underwent bilateral thrombectomy of both pulmonary arteries by IR on 4/25 Patient was a started on Eliquis Cardiology to arrange outpatient follow-up for history of structural heart disease.  4-New onset A flutter: RVR Continue with amiodarone 400 mg for another week then 200 mg daily Metoprolol dose increased today Continue with Eliquis  5-non-STEMI right and left heart cath unrevealing.  Chest pain-free Now on metoprolol  6-AKI mild azotemia: Creatinine normal baseline.  On admission 1.9 .creatinine has been stable at 1.6 7-elevated liver enzymes: Likely congestive hepatopathy.  Alcohol "play a role.  Resolving Hypomagnesemia repleted Hyperglycemia/prediabetes: A1c  6.4%.  -Counsel on lifestyle change including diet and exercise  COPD? -Continue as needed DuoNeb  Alcohol abuse: No withdrawal symptoms. -Encourage cessation  Tobacco use disorder -Encourage cessation.  Estimated body mass index is 24.01 kg/m as calculated from the following:   Height as of this encounter: 6' (1.829 m).   Weight as of this encounter: 80.3 kg.   DVT prophylaxis: Eliquis Code Status: Full code Family Communication: Care discussed directly with patient Disposition Plan:  Patient is from: Home Anticipated d/c date: 01/10/2020 discharge home Barriers to d/c or necessity for inpatient status: Patient is medical stable for discharge awaiting home oxygen  Consultants:   Cardiology  IR  PCCM  Procedures:  4/25-thrombectomy of both pulmonary arteries by IR. 4/28-R/LHC with normal coronaries and mildly elevated pulmonary artery pressures 4/29-TEE with LVEF of 30 to 35%, global hypokinesis, interventricular septum flattening, severely reduced RVSF and severely enlarged RV, large PFO with bidirectional shunting across the atrial septum but no thrombus  Antimicrobials:  None  Subjective: breathing ok,.   Objective: Vitals:   01/09/20 2204 01/10/20 0449 01/10/20 0833 01/10/20 1025  BP: 114/80  120/79 (!) 137/96  Pulse: (!) 108  93 94  Resp: 17  20 18   Temp: 97.9 F (36.6 C)  (!) 97.2 F (36.2 C) 98 F (36.7 C)  TempSrc: Oral  Oral Oral  SpO2: 98%  100% 95%  Weight:  80.3 kg    Height:        Intake/Output Summary (Last 24 hours) at 01/10/2020 1444 Last data filed at 01/10/2020 0400 Gross per 24 hour  Intake --  Output 750 ml  Net -750 ml   Filed Weights   01/08/20 0451 01/09/20 0448 01/10/20 0449  Weight: 79.5 kg 80.6 kg 80.3 kg    Examination:  General exam: NAD Respiratory system: CTA Cardiovascular system: S 1, S 2 RRR Gastrointestinal system: BS present, soft,  Central nervous system: Non focal.  Extremities: Symmetric power.   Skin: no rashes  Data Reviewed: I have personally reviewed following labs and imaging studies  CBC: Recent Labs  Lab 01/04/20 0321 01/05/20 0256 01/06/20 0453 01/07/20 0352 01/08/20 0335  WBC 14.7* 13.9* 14.8* 13.6* 12.2*  HGB 11.2* 10.9* 10.9* 11.6* 11.8*  HCT 32.1* 31.1* 31.1* 33.6* 34.2*  MCV 81.1 79.5* 80.4 79.8* 80.3  PLT 206 246 287 339 [id number]   Basic Metabolic Panel: Recent Labs  Lab 01/05/20 0256 01/05/20 0256 01/06/20 0453 01/07/20 0352 01/07/20 0919 01/08/20 0335 01/09/20 0350 01/09/20 0819 01/10/20 0308  NA 135  --   --   --  135 137  --  133* 135  K 4.5  --   --   --  3.9 3.8  --  4.6 5.0  CL 106  --   --   --  103 100  --  103 104  CO2 19*  --   --   --  23 27  --  22 19*  GLUCOSE 131*  --   --   --  139* 106*  --  133* 110*  BUN 22*  --   --   --  17 22*  --  27* 30*  CREATININE 1.66*  --   --   --  1.49* 1.68*  --  1.50* 1.47*  CALCIUM 7.7*  --   --   --  7.8* 8.3*  --  8.1* 8.2*  MG 1.8   < > 1.8 1.7  --  1.6* 2.2  --  1.9  PHOS 2.9  --   --   --  2.7 3.5  --  2.7 3.3   < > = values in this interval not displayed.   GFR: Estimated Creatinine Clearance: 59.4 mL/min (A) (by C-G formula based on SCr of 1.47 mg/dL (H)). Liver Function Tests: Recent Labs  Lab 01/04/20 0321 01/04/20 0321 01/05/20 0256 01/07/20 0919 01/08/20 0335 01/09/20 0819 01/10/20 0308  AST 45*  --   --   --   --   --   --   ALT 64*  --   --   --   --   --   --   ALKPHOS 98  --   --   --   --   --   --   BILITOT 0.7  --   --   --   --   --   --   PROT 5.8*  --   --   --   --   --   --   ALBUMIN 1.5*   < > 1.5* 1.5* 1.6* 1.7* 1.7*   < > = values in this interval not displayed.   No results for input(s): LIPASE, AMYLASE in the last 168 hours. No results for input(s): AMMONIA in the last 168 hours. Coagulation Profile: No results for input(s): INR, PROTIME in the last 168 hours. Cardiac Enzymes: No results for input(s): CKTOTAL, CKMB, CKMBINDEX, TROPONINI in the last 168  hours. BNP (last 3 results) No results for input(s): PROBNP in the last 8760 hours. HbA1C: No results for input(s): HGBA1C in the last 72 hours. CBG: Recent Labs  Lab 01/04/20 2110  GLUCAP 160*   Lipid Profile: No results for input(s): CHOL, HDL, LDLCALC, TRIG, CHOLHDL, LDLDIRECT in the last 72 hours. Thyroid Function Tests: No results for input(s): TSH, T4TOTAL, FREET4, T3FREE, THYROIDAB in the last 72 hours. Anemia Panel: No results for input(s): VITAMINB12, FOLATE, FERRITIN, TIBC, IRON, RETICCTPCT in the last 72 hours. Sepsis Labs: No results for input(s): PROCALCITON, LATICACIDVEN in the last 168 hours.  No results found for this or any previous visit (from the past 240 hour(s)).       Radiology Studies: No results found.      Scheduled Meds: . amiodarone  400 mg Oral Daily  . apixaban   Does not apply Once  . apixaban  5 mg Oral BID  . furosemide  40 mg Oral Daily  . metoprolol succinate  75 mg Oral Daily  . pantoprazole  40 mg Oral Daily  . sodium chloride flush  3 mL Intravenous Q12H   Continuous Infusions: . sodium chloride       LOS: 12 days    Time spent: 35 minutes    Aerika Groll A Adrielle Polakowski, MD [facility]   If 7PM-7AM, please contact night-coverage [email or web address]  01/10/2020, 2:44 PM

## 2020-01-11 ENCOUNTER — Institutional Professional Consult (permissible substitution): Payer: No Typology Code available for payment source | Admitting: Cardiovascular Disease

## 2020-01-11 LAB — CBC
HCT: 35.5 % — ABNORMAL LOW (ref 39.0–52.0)
Hemoglobin: 12.1 g/dL — ABNORMAL LOW (ref 13.0–17.0)
MCH: 27.6 pg (ref 26.0–34.0)
MCHC: 34.1 g/dL (ref 30.0–36.0)
MCV: 80.9 fL (ref 80.0–100.0)
Platelets: 399 10*3/uL (ref 150–400)
RBC: 4.39 MIL/uL (ref 4.22–5.81)
RDW: 15.9 % — ABNORMAL HIGH (ref 11.5–15.5)
WBC: 13.1 10*3/uL — ABNORMAL HIGH (ref 4.0–10.5)
nRBC: 0 % (ref 0.0–0.2)

## 2020-01-11 LAB — RENAL FUNCTION PANEL
Albumin: 1.8 g/dL — ABNORMAL LOW (ref 3.5–5.0)
Anion gap: 10 (ref 5–15)
BUN: 29 mg/dL — ABNORMAL HIGH (ref 6–20)
CO2: 23 mmol/L (ref 22–32)
Calcium: 8.4 mg/dL — ABNORMAL LOW (ref 8.9–10.3)
Chloride: 103 mmol/L (ref 98–111)
Creatinine, Ser: 1.77 mg/dL — ABNORMAL HIGH (ref 0.61–1.24)
GFR calc Af Amer: 48 mL/min — ABNORMAL LOW (ref >60)
GFR calc non Af Amer: 41 mL/min — ABNORMAL LOW (ref >60)
Glucose, Bld: 113 mg/dL — ABNORMAL HIGH (ref 70–99)
Phosphorus: 3.3 mg/dL (ref 2.5–4.6)
Potassium: 5 mmol/L (ref 3.5–5.1)
Sodium: 136 mmol/L (ref 135–145)

## 2020-01-11 LAB — MAGNESIUM: Magnesium: 1.7 mg/dL (ref 1.7–2.4)

## 2020-01-11 MED ORDER — FUROSEMIDE 40 MG PO TABS: 20.0000 mg | ORAL_TABLET | Freq: Every day | ORAL | 1 refills | Status: DC

## 2020-01-11 MED ORDER — MAGNESIUM SULFATE 2 GM/50ML IV SOLN
2.0000 g | Freq: Once | INTRAVENOUS | Status: AC
  Administered 2020-01-11: 2 g via INTRAVENOUS
  Filled 2020-01-11: qty 50

## 2020-01-11 MED ORDER — FUROSEMIDE 20 MG PO TABS
20.0000 mg | ORAL_TABLET | Freq: Every day | ORAL | Status: DC
  Administered 2020-01-11: 20 mg via ORAL
  Filled 2020-01-11: qty 1

## 2020-01-11 NOTE — Progress Notes (Signed)
Progress Note  Patient Name: Cody Joyce Date of Encounter: 01/11/2020  Primary Cardiologist: Sanda Klein, MD   Subjective   Not discharged yesterday - ?if waiting for home O2. Had appt today with structural heart at 8 am, will need to reschedule. Reported some blood with BM overnight.  Inpatient Medications    Scheduled Meds: . amiodarone  400 mg Oral Daily  . apixaban   Does not apply Once  . apixaban  5 mg Oral BID  . furosemide  40 mg Oral Daily  . metoprolol succinate  75 mg Oral Daily  . pantoprazole  40 mg Oral Daily  . sodium chloride flush  3 mL Intravenous Q12H   Continuous Infusions: . sodium chloride     PRN Meds: sodium chloride, acetaminophen, alum & mag hydroxide-simeth, docusate sodium, hydrocortisone, ipratropium-albuterol, metoprolol tartrate, ondansetron (ZOFRAN) IV, oxyCODONE, polyethylene glycol, sodium chloride flush   Vital Signs    Vitals:   01/10/20 1700 01/10/20 1800 01/10/20 2046 01/10/20 2114  BP:    108/81  Pulse:    (!) 110  Resp: 19 18  17   Temp:    98.7 F (37.1 C)  TempSrc:    Oral  SpO2: 99% 98% 95% 92%  Weight:      Height:       No intake or output data in the 24 hours ending 01/11/20 0842 Last 3 Weights 01/10/2020 01/09/2020 01/08/2020  Weight (lbs) 177 lb 0.5 oz 177 lb 11.1 oz 175 lb 4.3 oz  Weight (kg) 80.3 kg 80.6 kg 79.5 kg      Telemetry    Atrial flutter - rates around 100- Personally Reviewed  ECG    No new tracing  Physical Exam   GEN: No acute distress.   Neck: NO JVP Cardiac:  irregularly irregular, tachycardic Respiratory: lungs clear GI: Soft, nontender, non-distended  MS: No edema; No deformity. Neuro:  Nonfocal  Psych: Normal affect   Labs    High Sensitivity Troponin:   Recent Labs  Lab 12/31/19 1858 12/31/19 2258 01/01/20 0254 01/01/20 0555 01/01/20 0715  TROPONINIHS 3,000* 7,544* 11,131* 11,154* 10,744*      Chemistry Recent Labs  Lab 01/09/20 0819 01/10/20 0308 01/11/20 0317   NA 133* 135 136  K 4.6 5.0 5.0  CL 103 104 103  CO2 22 19* 23  GLUCOSE 133* 110* 113*  BUN 27* 30* 29*  CREATININE 1.50* 1.47* 1.77*  CALCIUM 8.1* 8.2* 8.4*  ALBUMIN 1.7* 1.7* 1.8*  GFRNONAA 50* 51* 41*  GFRAA 58* 60* 48*  ANIONGAP 8 12 10      Hematology Recent Labs  Lab 01/06/20 0453 01/07/20 0352 01/08/20 0335  WBC 14.8* 13.6* 12.2*  RBC 3.87* 4.21* 4.26  HGB 10.9* 11.6* 11.8*  HCT 31.1* 33.6* 34.2*  MCV 80.4 79.8* 80.3  MCH 28.2 27.6 27.7  MCHC 35.0 34.5 34.5  RDW 15.9* 15.6* 15.9*  PLT 287 339 395    BNP Recent Labs  Lab 01/06/20 0453 01/09/20 0350  BNP 1,710.4* 1,504.1*     DDimer No results for input(s): DDIMER in the last 168 hours.   Radiology    No results found.  Cardiac Studies   R/L HEART CATH 01/03/2020   Normal coronary arteries.  No evidence of left-to-right shunting by oximetry.  Mild elevation in pulmonary artery pressures.  Technically unable to obtain pulmonary capillary wedge pressure.  Normal left ventricular end-diastolic pressure.  RECOMMENDATIONS:   Resume anticoagulation therapy for pulmonary embolism as soon as possible.  Fick Cardiac Output 5.15 L/min  Fick Cardiac Output Index 2.51 (L/min)/BSA  RA A Wave 17 mmHg  RA V Wave 16 mmHg  RA Mean 15 mmHg  RV Systolic Pressure 38 mmHg  RV Diastolic Pressure 12 mmHg  RV EDP 17 mmHg  PA Systolic Pressure 37 mmHg  PA Diastolic Pressure 20 mmHg  PA Mean 26 mmHg  PW A Wave 21 mmHg  PW V Wave 37 mmHg  PW Mean 26 mmHg  AO Systolic Pressure 123456 mmHg  AO Diastolic Pressure 78 mmHg  AO Mean 91 mmHg  LV Systolic Pressure [id number] mmHg  LV Diastolic Pressure 12 mmHg  LV EDP 16 mmHg  AOp Systolic Pressure 123456 mmHg  AOp Diastolic Pressure 79 mmHg  AOp Mean Pressure 91 mmHg  LVp Systolic Pressure 123XX123 mmHg  LVp Diastolic Pressure 13 mmHg  LVp EDP Pressure 16 mmHg  QP/QS 1.03  TPVR Index 10.35 HRUI  TSVR Index 36.23 HRUI  TPVR/TSVR Ratio 0.29   ECHO 04/25 and  04/26  1. Left ventricular ejection fraction, by estimation, is 35 to 40%. The  left ventricle has moderately decreased function. The left ventricle  demonstrates global hypokinesis. Left ventricular diastolic parameters are  consistent with Grade I diastolic  dysfunction (impaired relaxation). There is the interventricular septum is  flattened in systole and diastole, consistent with right ventricular  pressure and volume overload.  2. Right ventricular systolic function is moderately reduced.  3. Right atrial size was moderately dilated.  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 Trivial mitral valve regurgitation.  5. Unable to obtain adequate Doppler signal to estimate pulmonary  pressures. Tricuspid valve regurgitation is trivial.   1. Agitated saline contrast bubble study was positive with shunting  observed within 5 cardiac cycles suggestive of interatrial shunt.  2. Right ventricular systolic function is mildly reduced. The right  ventricular size is moderately enlarged.  3. Remainder of findings per transthoracic echocardiogram 12/30/19.   TEE 01/03/20 1. There is a smoke in the left ventricular apex with no evidence for a  thrombus on Definity echo contrast images.. Left ventricular ejection  fraction, by estimation, is 30 to 35%. The left ventricle has moderately  decreased function. The left ventricle  demonstrates global hypokinesis. There is the interventricular septum is  flattened in systole and diastole, consistent with right ventricular  pressure and volume overload.  2. Right ventricular systolic function is severely reduced. The right  ventricular size is severely enlarged. There is normal pulmonary artery  systolic pressure.  3. Left atrial size was moderately dilated. No left atrial/left atrial  appendage thrombus was detected.  4. Right atrial size was moderately dilated.  5. The mitral valve is normal in structure. Mild mitral valve   regurgitation. No evidence of mitral stenosis.  6. The aortic valve is normal in structure. Aortic valve regurgitation is  not visualized. No aortic stenosis is present.  7. The inferior vena cava is normal in size with greater than 50%  respiratory variability, suggesting right atrial pressure of 3 mmHg.  8. Agitated saline contrast bubble study was significantly positive with  shunting observed within the first cardiac cycles suggestive of  interatrial shunt. There is a large patent foramen ovale with  bidirectional shunting across atrial septum.   Conclusion(s)/Recommendation(s): Findings are concerning for an  interatrial shunt as detailed above.   Patient Profile     59 y.o. male with submassive pulmonary embolism and synchronous paradoxical embolic renal infarct, L femoral DVT, newly recognized left ventricular systolic  dysfunction, markedly elevated cardiac enzymes c/w NSTEMI, normal coronary arteries by angio and suspicion of ASD.   Assessment & Plan    1.  Chronic systolic and diastolic heart failure -Echocardiogram showed LV function of 35 to 40% with global hypokinesis and grade 1 diastolic dysfunction.  Cath with normal coronaries.  Felt possible Takotsubo or nonischemic cardiomyopathy.  He was not fluid overloaded when last seen by Dr. Sallyanne Kuster 4/30. -Early morning of 5/2 patient had acute hypoxic respiratory failure requiring further high flow oxygen and eventually BiPAP.  Chest x-ray with pulmonary edema.  He was started on IV Lasix and discharged.  During acute hypoxic event he was briefly in atrial flutter RVR and then converted and then again went into a flutter RVR at rate of 120s -Creatinine trending up - decrease lasix to 20 mg daily. -Continue Toprol XL 75 mg Daily -Not on ACE or ARB due to AKI  2.  Paroxysmal atrial flutter with rapid ventricular rate -Had a brief atrial flutter with rapid ventricular rate episode on 5/2 that may have led to pulmonary edema.   He was cardioverted and then again went into a flutter with RVR with sustained rate of 120s -HR control improved overnight -On Eliquis for anticoagulation  3. Paradoxical embolism/PFO - Plan to follow up with structural team as outpatient - No significant shunt on cath, however saline contrast did suggest bidirectional shunt - TEE showed shunting suggestive of interatrail shunt and large PFO with bidirectional shunt across atrial septum - Outpatient follow-up tomorrow to discuss PFO closure with Dr. Burt Knack  4. PE and DVT - Per primary team   5. NSTEMI - cath with normal coronaries  6. Electrolytes: Keep K>4.5, Mg >2.0. Give 2G mag sulfate today.   Awaiting oxygen setup for home, but ok to d/c home today from cardiac standpoint.  For questions or updates, please contact [place] Please consult [email or web address] for contact info under   Pixie Casino, MD, FACC, [place] Director of the [facility] Diplomate of the American Board of Clinical Lipidology Attending Cardiologist  Direct Dial: [phone]  Fax: [phone]  Website:  [email or web address]  Pixie Casino, MD  01/11/2020, 8:42 AM

## 2020-01-11 NOTE — Progress Notes (Signed)
Physical Therapy Treatment Patient Details Name: Cody Joyce MRN: [id number] DOB: 01-Dec-1960 Today's Date: 01/11/2020    History of Present Illness 59 y.o. M with PMH of tobacco use, alcohol use and emphysema who had a tree branch fall on his right rib-cage on 4/15. He had a CXR which showed no fracture, but since then has not been moving much due to pain.  Found to have submassive PE. Pt also states he tore is R gastroc and quad but no evidence of this in chart review or symptoms. Pt also with LLE DVT. Pt is s/p  thrombectomy of both pulmonary arteries by IR on 4/25. Pt is s/p TEE that showed severely enlarged RV and large PFO with bidirectional shunting across the atrial septum but no thrombus.     PT Comments    Mobility wise, pt is mobilizing great with superivison to mod I level of assist. Pt with dyspnea on exertin during gait, requiring PT cuing for pursed lip breathing and standing rest breaks during gait as needed. Pt is upset today about d/c plan being delayed due to O2 tank from [place] not being delivered yet, case manager aware. PT to continue to follow to ensure mobility progression and return to baseline.   SpO2, on 2LO2 via [place]: 88-95% during mobility HRmax: 117 bpm    Follow Up Recommendations  No PT follow up     Equipment Recommendations  None recommended by PT    Recommendations for Other Services       Precautions / Restrictions Precautions Precautions: Other (comment) Precaution Comments: watch SpO2, HR Restrictions Weight Bearing Restrictions: No    Mobility  Bed Mobility Overal bed mobility: Modified Independent                Transfers Overall transfer level: Modified independent               General transfer comment: Mod I for increased time to rise, no physical assist  Ambulation/Gait Ambulation/Gait assistance: Supervision Gait Distance (Feet): 375 Feet Assistive device: None Gait Pattern/deviations: Step-through pattern;Decreased  stride length Gait velocity: decr   General Gait Details: supervision for safety, with intermittent SpO2 monitoring. DOE 2/4 with SpO2 88% and greater on 2LO2 during gait.   Stairs             Wheelchair Mobility    Modified Rankin (Stroke Patients Only)       Balance Overall balance assessment: Needs assistance   Sitting balance-Leahy Scale: Normal       Standing balance-Leahy Scale: Good                              Cognition Arousal/Alertness: Awake/alert Behavior During Therapy: WFL for tasks assessed/performed Overall Cognitive Status: Within Functional Limits for tasks assessed                                 General Comments: pt frustrated this day due to difficulty getting O2 delivered by [place], which is delaying pt's d/c      Exercises Other Exercises Other Exercises: Pt education - pursed lip breathing to recover sats    General Comments        Pertinent Vitals/Pain Pain Assessment: No/denies pain    Home Living                      Prior Function  PT Goals (current goals can now be found in the care plan section) Acute Rehab PT Goals Patient Stated Goal: return to independence, have lungs heal PT Goal Formulation: With patient Time For Goal Achievement: 01/18/20 Potential to Achieve Goals: Good Progress towards PT goals: Progressing toward goals    Frequency    Min 3X/week      PT Plan Current plan remains appropriate    Co-evaluation              AM-PAC PT "6 Clicks" Mobility   Outcome Measure  Help needed turning from your back to your side while in a flat bed without using bedrails?: None Help needed moving from lying on your back to sitting on the side of a flat bed without using bedrails?: None Help needed moving to and from a bed to a chair (including a wheelchair)?: None Help needed standing up from a chair using your arms (e.g., wheelchair or bedside chair)?:  None Help needed to walk in hospital room?: None Help needed climbing 3-5 steps with a railing? : None 6 Click Score: 24    End of Session Equipment Utilized During Treatment: Oxygen Activity Tolerance: Patient tolerated treatment well Patient left: in bed;with call bell/phone within reach Nurse Communication: Mobility status PT Visit Diagnosis: Other abnormalities of gait and mobility (R26.89)     Time: [id number] PT Time Calculation (min) (ACUTE ONLY): 15 min  Charges:  $Gait Training: 8-22 mins                     Rhoda Waldvogel E, PT Acute Rehabilitation Services Pager [phone]  Office [phone]   Rivers Gassmann D Angla Delahunt 01/11/2020, 4:10 PM

## 2020-01-11 NOTE — Progress Notes (Deleted)
Cardiology Office Note:    Date:  01/11/2020   ID:  Cody Joyce, DOB December 16, 1960, MRN [id number]  PCP:  Patient, No Pcp Per  Cardiologist:  Sanda Klein, MD  Electrophysiologist:  None   Referring MD: No ref. provider found   No chief complaint on file.   History of Present Illness:    Cody Joyce is a 59 y.o. male with a hx of ***  Past Medical History:  Diagnosis Date  . Acute deep vein thrombosis (DVT) of left lower extremity ([place]) 01/03/2020  . Acute pulmonary embolism with acute cor pulmonale ([place]) 12/30/2019  . Acute systolic CHF (congestive heart failure) ([place]) 01/03/2020  . Alcohol dependence ([place]) 07/25/2018  . Aortic atherosclerosis ([place]) 08/09/2018  . COPD (chronic obstructive pulmonary disease) ([place]) 07/25/2018  . Emphysema of lung ([place])   . 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 We will we ultrasound at followup the   . Mediastinal adenopathy 07/25/2018  . Multiple lung nodules on CT 07/25/2018  . Non-STEMI (non-ST elevated myocardial infarction) ([place]) 01/03/2020  . Osteoarthritis of left knee 05/30/2013   Tricompartmental disease Free-floating mass mostly in the superior lateral patellar compartment.   [name] PFO (patent foramen ovale) 01/03/2020  . Pulmonary embolism ([place]) 12/29/2019  . Renal infarct [facility])     Past Surgical History:  Procedure Laterality Date  . BUBBLE STUDY  01/03/2020   Procedure: BUBBLE STUDY;  Surgeon: [name], MD;  Location: [facility];  Service: Cardiovascular;;  . IR ANGIOGRAM PULMONARY BILATERAL SELECTIVE  12/30/2019  . IR ANGIOGRAM SELECTIVE EACH ADDITIONAL VESSEL  12/30/2019  . IR ANGIOGRAM SELECTIVE EACH ADDITIONAL VESSEL  12/30/2019  . IR THROMBECT PRIM MECH ADD (INCLU) MOD SED  12/30/2019  . IR THROMBECT PRIM MECH INIT (INCLU) MOD SED  12/30/2019  . IR US GUIDE VASC ACCESS RIGHT  12/30/2019  . RIGHT/LEFT HEART CATH AND CORONARY ANGIOGRAPHY N/A 01/02/2020   Procedure: RIGHT/LEFT HEART CATH AND CORONARY  ANGIOGRAPHY;  Surgeon: [name], MD;  Location: [facility];  Service: Cardiovascular;  Laterality: N/A;  . TEE WITHOUT CARDIOVERSION N/A 01/03/2020   Procedure: TRANSESOPHAGEAL ECHOCARDIOGRAM (TEE)   DEFINITY ;  Surgeon: [name], MD;  Location: [facility] ENDOSCOPY;  Service: Cardiovascular;  Laterality: N/A;    Current Medications: No outpatient medications have been marked as taking for the 01/11/20 encounter (Appointment) with [name], MD.     Allergies:   Patient has no known allergies.   Social History   Socioeconomic History  . Marital status: Married    Spouse name: Not on file  . Number of children: Not on file  . Years of education: Not on file  . Highest education level: Not on file  Occupational History  . Not on file  Tobacco Use  . Smoking status: Light Tobacco Smoker  . Smokeless tobacco: Never Used  Substance and Sexual Activity  . Alcohol use: Yes    Alcohol/week: 6.0 standard drinks    Types: 2 Glasses of wine, 4 Cans of beer per week  . Drug use: No  . Sexual activity: Yes  Other Topics Concern  . Not on file  Social History Narrative   Marital status:  Married x 3 years; not happily married.      Children:  None      Lives: alone.      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      Tobacco: smoke cigars 2 per  day.  Quit cigarettes in 2001; smoked x 15 years.      Alcohol:  Weekends 4 beers per week; 2 drinks per week.      Drugs:  None      Exercising:  Three days per week; active job.        Seatbelt:  100%      Guns:  None      Sexual activity: condoms; Gonorrhea in high school.  One partner in past year.  Last STD screening 2013.   Social Determinants of Health   Financial Resource Strain:   . Difficulty of Paying Living Expenses:   Food Insecurity:   . Worried About Charity fundraiser in the Last Year:   . Arboriculturist in the Last Year:   Transportation Needs:   . Film/video editor (Medical):     [name] Lack of Transportation (Non-Medical):   Physical Activity:   . Days of Exercise per Week:   . Minutes of Exercise per Session:   Stress:   . Feeling of Stress :   Social Connections:   . Frequency of Communication with Friends and Family:   . Frequency of Social Gatherings with Friends and Family:   . Attends Religious Services:   . Active Member of Clubs or Organizations:   . Attends Archivist Meetings:   [name] Marital Status:      Family History: The patient's family history includes Glaucoma in his father and mother; Heart disease in his father.  ROS:   Please see the history of present illness.    All other systems reviewed and are negative.  EKGs/Labs/Other Studies Reviewed:    The following studies were reviewed today: Cath: Conclusion   Normal coronary arteries.  No evidence of left-to-right shunting by oximetry.  Mild elevation in pulmonary artery pressures.  Technically unable to obtain pulmonary capillary wedge pressure.  Normal left ventricular end-diastolic pressure.  RECOMMENDATIONS:   Resume anticoagulation therapy for pulmonary embolism as soon as possible.   TEE: IMPRESSIONS    1. There is a smoke in the left ventricular apex with no evidence for a  thrombus on Definity echo contrast images.. Left ventricular ejection  fraction, by estimation, is 30 to 35%. The left ventricle has moderately  decreased function. The left ventricle  demonstrates global hypokinesis. There is the interventricular septum is  flattened in systole and diastole, consistent with right ventricular  pressure and volume overload.  2. Right ventricular systolic function is severely reduced. The right  ventricular size is severely enlarged. There is normal pulmonary artery  systolic pressure.  3. Left atrial size was moderately dilated. No left atrial/left atrial  appendage thrombus was detected.  4. Right atrial size was moderately dilated.  5. The  mitral valve is normal in structure. Mild mitral valve  regurgitation. No evidence of mitral stenosis.  6. The aortic valve is normal in structure. Aortic valve regurgitation is  not visualized. No aortic stenosis is present.  7. The inferior vena cava is normal in size with greater than 50%  respiratory variability, suggesting right atrial pressure of 3 mmHg.  8. Agitated saline contrast bubble study was significantly positive with  shunting observed within the first cardiac cycles suggestive of  interatrial shunt. There is a large patent foramen ovale with  bidirectional shunting across atrial septum.   Conclusion(s)/Recommendation(s): Findings are concerning for an  interatrial shunt as detailed above.   EKG:  EKG is not ordered today.  The  ekg ordered 01/01/2020 demonstrates atrial flutter with 2:1 conduction, HR 138 bpm  Recent Labs: 01/02/2020: TSH 4.168 01/04/2020: ALT 64 01/08/2020: Hemoglobin 11.8; Platelets 395 01/09/2020: B Natriuretic Peptide 1,504.1 01/11/2020: BUN 29; Creatinine, Ser 1.77; Magnesium 1.7; Potassium 5.0; Sodium 136  Recent Lipid Panel No results found for: CHOL, TRIG, HDL, CHOLHDL, VLDL, LDLCALC, LDLDIRECT  Physical Exam:    VS:  There were no vitals taken for this visit.    Wt Readings from Last 3 Encounters:  01/10/20 177 lb 0.5 Cody (80.3 kg)  08/09/18 180 lb (81.6 kg)  07/26/18 175 lb 8 Cody (79.6 kg)     GEN: *** Well nourished, well developed in no acute distress HEENT: Normal NECK: No JVD; No carotid bruits LYMPHATICS: No lymphadenopathy CARDIAC: ***RRR, no murmurs, rubs, gallops RESPIRATORY:  Clear to auscultation without rales, wheezing or rhonchi  ABDOMEN: Soft, non-tender, non-distended MUSCULOSKELETAL:  No edema; No deformity  SKIN: Warm and dry NEUROLOGIC:  Alert and oriented x 3 PSYCHIATRIC:  Normal affect   ASSESSMENT:    No diagnosis found. PLAN:    In order of problems listed above:  1. ***   Medication Adjustments/Labs and  Tests Ordered: Current medicines are reviewed at length with the patient today.  Concerns regarding medicines are outlined above.  No orders of the defined types were placed in this encounter.  No orders of the defined types were placed in this encounter.   There are no Patient Instructions on file for this visit.   Signed, [name], MD  01/11/2020 7:59 AM    [place]

## 2020-01-11 NOTE — Progress Notes (Signed)
PROGRESS NOTE    Cody Joyce  [id number] DOB: 1961/06/20 DOA: 12/29/2019 PCP: Patient, No Pcp Per   Brief Narrative: 59 year old with past medical history significant for COPD, chronic alcoholism and tobacco abuse who failed and hit his rib cage on 4/15, negative chest x-ray with since being less mobile. Patient presented to the ED on 12/29/2019 from home with complaint of shortness of breath, chest pain.  EMS noted oxygen saturation 60% improved in the ED on the nonrebreather mask.  CT angio chest showed large bilateral pulmonary emboli with evidence of right heart strain and an adherent mural thrombus along the anterior wall of the right ventricle with bilateral groundglass opacity likely developing pulmonary infarction, underlying emphysema.  CT abdomen pelvis showed multiple left renal cortical infarcts.  Patient was admitted to the critical care started on IV heparin drip.  Patient underwent thrombectomy of both pulmonary arteries by IR on 4/25.  Patient subsequently improved and was transferred to the hospital service on 4/26. Patient had a brief episode of a flutter on 4/27.  He was a started on IV amiodarone.  Subsequently transition to oral amiodarone.  Right and left heart cath on 4/28 with mild pulmonary hypertension but normal coronaries, LVEDP and no evidence of left to right shunt by oximetry.  TEE on 4/29 with LVEF of 30 to 35%, global hypokinesis, interventricular septal flattening, severely reduced RV SF and severely enlarged RV and large PFO with bidirectional shunting across the atrial septum but no thrombus.  Patient was cleared for discharge by cardiology with went into pulmonary edema with increased oxygen requirement.  He was a started on IVLexis on 5/2 and responded well.  Oxygen requirement has improved from 12 L high flow oxygen to 2 L today.  He was transitioned to oral Lasix.  His heart rate has increased , and Coreg was changed to metoprolol. She can require  home oxygen and [facility]   Assessment & Plan:   Principal Problem:   Aortic atherosclerosis (HCC) Active Problems:   COPD (chronic obstructive pulmonary disease) (HCC)   Alcohol dependence ([place])   Pulmonary embolism (HCC)   Renal infarct ([place])   Acute pulmonary embolism with acute cor pulmonale (HCC)   PFO (patent foramen ovale)   Acute systolic CHF (congestive heart failure) (HCC)   Non-STEMI (non-ST elevated myocardial infarction) (HCC)   Acute deep vein thrombosis (DVT) of left lower extremity (HCC)   Atrial flutter (HCC)  1-Acute respiratory failure with hypoxia, multifactorial including PE, PFO shunting, new onset systolic heart failure, a flutter, possible non-STEMI Chest x-ray was concerning with pulmonary interstitial edema Patient was treated with IV Lasix Thrombectomy Continue with Eliquis  2-New acute combined CHF ejection fraction 35 to 40%, global hypokinesis, BNP more than 1000 Treated with IV Laxis initially.  Transition to oral lasix.  Requiring 2 L oxygen on ambulation needs 6 L.  Lasix change to 20 mg daily, due to increase cr.   3-Sub-Massive PE with acute cor pulmonale/left lower extremity, femoral-popliteal DVT: Multiple left renal cortical infarct paradoxical embolism in.  TEE revealed large PFO Patient underwent bilateral thrombectomy of both pulmonary arteries by IR on 4/25 Patient was a started on Eliquis Cardiology to arrange outpatient follow-up for history of structural heart disease.  4-New onset A flutter: RVR Continue with amiodarone 400 mg for another week then 200 mg daily Metoprolol dose increased today Continue with Eliquis  5-non-STEMI right and left heart cath unrevealing.  Chest pain-free  Now on metoprolol  6-AKI mild azotemia: Creatinine normal baseline.  On admission 1.9 .creatinine has been stable at 1.6 7-elevated liver enzymes: Likely congestive hepatopathy.  Alcohol "play a role.   Resolving Hypomagnesemia repleted lasix decrease to 20 mg.   Hyperglycemia/prediabetes: A1c 6.4%.  -Counsel on lifestyle change including diet and exercise  COPD? -Continue as needed DuoNeb  Alcohol abuse: No withdrawal symptoms. -Encourage cessation  Tobacco use disorder -Encourage cessation.  Small amount of blood in the stool; history of hemorrhoids.  Hydrocortisone.  HB stable.  No further bleeding.   Estimated body mass index is 24.01 kg/m as calculated from the following:   Height as of this encounter: 6' (1.829 m).   Weight as of this encounter: 80.3 kg.   DVT prophylaxis: Eliquis Code Status: Full code Family Communication: Care discussed directly with patient Disposition Plan:  Patient is from: Home Anticipated d/c date: 01/10/2020 discharge home Barriers to d/c or necessity for inpatient status: Patient is medical stable for discharge awaiting home oxygen  Consultants:   Cardiology  IR  PCCM  Procedures:  4/25-thrombectomy of both pulmonary arteries by IR. 4/28-R/LHC with normal coronaries and mildly elevated pulmonary artery pressures 4/29-TEE with LVEF of 30 to 35%, global hypokinesis, interventricular septum flattening, severely reduced RVSF and severely enlarged RV, large PFO with bidirectional shunting across the atrial septum but no thrombus  Antimicrobials:  None  Subjective: Denies worsening dyspnea. Had small amount of blood in the stool  Objective: Vitals:   01/10/20 2046 01/10/20 2114 01/11/20 0905 01/11/20 0953  BP:  108/81 104/81   Pulse:  (!) 110 76 (!) 114  Resp:  17    Temp:  98.7 F (37.1 C) 97.9 F (36.6 C)   TempSrc:  Oral Oral   SpO2: 95% 92% 99%   Weight:      Height:       No intake or output data in the 24 hours ending 01/11/20 1537 Filed Weights   01/08/20 0451 01/09/20 0448 01/10/20 0449  Weight: 79.5 kg 80.6 kg 80.3 kg    Examination:  General exam: NAD Respiratory system: CTA Cardiovascular system: S  1, S 2 RRR Gastrointestinal system: BS present, soft, nt Central nervous system: Non focal.  Extremities: Symmetric power Skin: no rashes  Data Reviewed: I have personally reviewed following labs and imaging studies  CBC: Recent Labs  Lab 01/05/20 0256 01/06/20 0453 01/07/20 0352 01/08/20 0335 01/11/20 0810  WBC 13.9* 14.8* 13.6* 12.2* 13.1*  HGB 10.9* 10.9* 11.6* 11.8* 12.1*  HCT 31.1* 31.1* 33.6* 34.2* 35.5*  MCV 79.5* 80.4 79.8* 80.3 80.9  PLT 246 287 339 395 [id number]   Basic Metabolic Panel: Recent Labs  Lab 01/05/20 0256 01/07/20 0352 01/07/20 0919 01/08/20 0335 01/09/20 0350 01/09/20 0819 01/10/20 0308 01/11/20 0317  NA   < >  --  135 137  --  133* 135 136  K   < >  --  3.9 3.8  --  4.6 5.0 5.0  CL   < >  --  103 100  --  103 104 103  CO2   < >  --  23 27  --  22 19* 23  GLUCOSE   < >  --  139* 106*  --  133* 110* 113*  BUN   < >  --  17 22*  --  27* 30* 29*  CREATININE   < >  --  1.49* 1.68*  --  1.50* 1.47* 1.77*  CALCIUM   < >  --  7.8* 8.3*  --  8.1* 8.2* 8.4*  MG  --  1.7  --  1.6* 2.2  --  1.9 1.7  PHOS   < >  --  2.7 3.5  --  2.7 3.3 3.3   < > = values in this interval not displayed.   GFR: Estimated Creatinine Clearance: 49.3 mL/min (A) (by C-G formula based on SCr of 1.77 mg/dL (H)). Liver Function Tests: Recent Labs  Lab 01/07/20 0919 01/08/20 0335 01/09/20 0819 01/10/20 0308 01/11/20 0317  ALBUMIN 1.5* 1.6* 1.7* 1.7* 1.8*   No results for input(s): LIPASE, AMYLASE in the last 168 hours. No results for input(s): AMMONIA in the last 168 hours. Coagulation Profile: No results for input(s): INR, PROTIME in the last 168 hours. Cardiac Enzymes: No results for input(s): CKTOTAL, CKMB, CKMBINDEX, TROPONINI in the last 168 hours. BNP (last 3 results) No results for input(s): PROBNP in the last 8760 hours. HbA1C: No results for input(s): HGBA1C in the last 72 hours. CBG: Recent Labs  Lab 01/04/20 2110  GLUCAP 160*   Lipid Profile: No results  for input(s): CHOL, HDL, LDLCALC, TRIG, CHOLHDL, LDLDIRECT in the last 72 hours. Thyroid Function Tests: No results for input(s): TSH, T4TOTAL, FREET4, T3FREE, THYROIDAB in the last 72 hours. Anemia Panel: No results for input(s): VITAMINB12, FOLATE, FERRITIN, TIBC, IRON, RETICCTPCT in the last 72 hours. Sepsis Labs: No results for input(s): PROCALCITON, LATICACIDVEN in the last 168 hours.  No results found for this or any previous visit (from the past 240 hour(s)).       Radiology Studies: No results found.      Scheduled Meds: . amiodarone  400 mg Oral Daily  . apixaban   Does not apply Once  . apixaban  5 mg Oral BID  . furosemide  20 mg Oral Daily  . metoprolol succinate  75 mg Oral Daily  . pantoprazole  40 mg Oral Daily  . sodium chloride flush  3 mL Intravenous Q12H   Continuous Infusions: . sodium chloride       LOS: 13 days    Time spent: 35 minutes    Deryl Ports A Shanikqua Zarzycki, MD [facility]   If 7PM-7AM, please contact night-coverage [email or web address]  01/11/2020, 3:37 PM

## 2020-01-11 NOTE — Plan of Care (Signed)
Patient adequate for discharge.

## 2020-01-11 NOTE — Discharge Summary (Signed)
Physician Discharge Summary  Cody Joyce [id number] DOB: 18-Oct-1960 DOA: 12/29/2019  PCP: Patient, No Pcp Per  Admit date: 12/29/2019 Discharge date: 01/11/2020  Admitted From:  Home Disposition:  Home   Recommendations for Outpatient Follow-up:  1. Follow up with PCP in 1-2 weeks 2. Please obtain BMP/CBC in one week 3. Needs to follow up with Dr Burt Knack for PFO closure.  4. Follow up with cardiology for further care of HF. 5. Needs appropriate screening for malignancy  Home Health: none Equipment/Devices: Home oxygen  Discharge Condition: Stable CODE STATUS: Full code Diet recommendation: Heart Healthy  Brief/Interim Summary: 59 year old with past medical history significant for COPD, chronic alcoholism and tobacco abuse who failed and hit his rib cage on 4/15, negative chest x-ray with since being less mobile. Patient presented to the ED on 12/29/2019 from home with complaint of shortness of breath, chest pain.  EMS noted oxygen saturation 60% improved in the ED on the nonrebreather mask.  CT angio chest showed large bilateral pulmonary emboli with evidence of right heart strain and an adherent mural thrombus along the anterior wall of the right ventricle with bilateral groundglass opacity likely developing pulmonary infarction, underlying emphysema.  CT abdomen pelvis showed multiple left renal cortical infarcts.  Patient was admitted to the critical care started on IV heparin drip.  Patient underwent thrombectomy of both pulmonary arteries by IR on 4/25.  Patient subsequently improved and was transferred to the hospital service on 4/26. Patient had a brief episode of a flutter on 4/27.  He was a started on IV amiodarone.  Subsequently transition to oral amiodarone.  Right and left heart cath on 4/28 with mild pulmonary hypertension but normal coronaries, LVEDP and no evidence of left to right shunt by oximetry.  TEE on 4/29 with LVEF of 30 to 35%, global hypokinesis,  interventricular septal flattening, severely reduced RV SF and severely enlarged RV and large PFO with bidirectional shunting across the atrial septum but no thrombus.  Patient was cleared for discharge by cardiology with went into pulmonary edema with increased oxygen requirement.  He was a started on IVLexis on 5/2 and responded well.  Oxygen requirement has improved from 12 L high flow oxygen to 2 L today.  He was transitioned to oral Lasix.  His heart rate has increased , and Coreg was changed to metoprolol. She can require home oxygen and [facility]  1-Acute respiratory failure with hypoxia, multifactorial including PE, PFO shunting, new onset systolic heart failure, a flutter, possible non-STEMI Chest x-ray was concerning with pulmonary interstitial edema Patient was treated with IV Lasix Thrombectomy Continue with Eliquis  2-New acute combined CHF ejection fraction 35 to 40%, global hypokinesis, BNP more than 1000 Treated with IV Laxis initially.  Transition to oral lasix.  Requiring 2 L oxygen on ambulation needs 6 L.  Lasix change to 20 mg daily, due to increase cr.   3-Sub-Massive PE with acute cor pulmonale/left lower extremity, femoral-popliteal DVT: Multiple left renal cortical infarct paradoxical embolism in.  TEE revealed large PFO Patient underwent bilateral thrombectomy of both pulmonary arteries by IR on 4/25 Patient was a started on Eliquis Cardiology to arrange outpatient follow-up for history of structural heart disease.  4-New onset A flutter: RVR Continue with amiodarone 400 mg for another week then 200 mg daily Metoprolol dose increased today Continue with Eliquis  5-non-STEMI right and left heart cath unrevealing.  Chest pain-free Now on metoprolol  6-AKI mild azotemia:  Creatinine normal baseline.  On admission 1.9 .creatinine has been stable at 1.6 7-elevated liver enzymes: Likely congestive hepatopathy.  Alcohol "play a  role.  Resolving Hypomagnesemia repleted lasix decrease to 20 mg.   Hyperglycemia/prediabetes: A1c 6.4%.  -Counsel on lifestyle change including diet and exercise  COPD? -Continue as needed DuoNeb  Alcohol abuse: No withdrawal symptoms. -Encourage cessation  Tobacco use disorder -Encourage cessation.  Small amount of blood in the stool; history of hemorrhoids.  Hydrocortisone.  HB stable.  No further bleeding.    Discharge Diagnoses:  Principal Problem:   Aortic atherosclerosis (HCC) Active Problems:   COPD (chronic obstructive pulmonary disease) (HCC)   Alcohol dependence ([place])   Pulmonary embolism (HCC)   Renal infarct ([place])   Acute pulmonary embolism with acute cor pulmonale (HCC)   PFO (patent foramen ovale)   Acute systolic CHF (congestive heart failure) (HCC)   Non-STEMI (non-ST elevated myocardial infarction) (HCC)   Acute deep vein thrombosis (DVT) of left lower extremity (HCC)   Atrial flutter [facility])    Discharge Instructions  Discharge Instructions    Diet - low sodium heart healthy   Complete by: As directed    Diet - low sodium heart healthy   Complete by: As directed    Diet - low sodium heart healthy   Complete by: As directed    Increase activity slowly   Complete by: As directed    Increase activity slowly   Complete by: As directed    Increase activity slowly   Complete by: As directed      Allergies as of 01/11/2020   No Known Allergies     Medication List    STOP taking these medications   Blue-Emu Super Strength Crea     TAKE these medications   acetaminophen 500 MG tablet Commonly known as: TYLENOL Take 1,000 mg by mouth every 6 (six) hours as needed for headache (pain).   albuterol 108 (90 Base) MCG/ACT inhaler Commonly known as: VENTOLIN HFA Inhale 2 puffs into the lungs 4 (four) times daily.   amiodarone 200 MG tablet Commonly known as: PACERONE Take 2 tablets (400 mg total) by mouth daily for 7 days, THEN 1  tablet (200 mg total) daily. Start taking on: Jan 09, 2020   Apixaban Starter Pack (10mg  and 5mg ) Commonly known as: ELIQUIS STARTER PACK Take as directed on package: start with two-5mg  tablets twice daily for 7 days. On day 8, switch to one-5mg  tablet twice daily.   apixaban 5 MG Tabs tablet Commonly known as: ELIQUIS Take 1 tablet (5 mg total) by mouth 2 (two) times daily. **start this after completing Eliquis Starter Pack**   furosemide 40 MG tablet Commonly known as: LASIX Take 0.5 tablets (20 mg total) by mouth daily.   LIDOCAINE EX Apply 1 application topically daily as needed (pain).   metoprolol succinate 25 MG 24 hr tablet Commonly known as: TOPROL-XL Take 3 tablets (75 mg total) by mouth daily.   multivitamin with minerals Tabs tablet Take 1 tablet by mouth daily.   omeprazole 20 MG capsule Commonly known as: PRILOSEC Take 20 mg by mouth 2 (two) times daily before a meal.   Voltaren 1 % Gel Generic drug: diclofenac Sodium Apply 1 application topically 3 (three) times daily as needed (knee pain).            Durable Medical Equipment  (From admission, onward)         Start     Ordered   01/10/20  1445  For home use only DME Tub bench  Once     01/10/20 1444   01/10/20 0852  For home use only DME oxygen  Once    Question Answer Comment  Length of Need 6 Months   Mode or (Route) Nasal cannula   Liters per Minute 6   Frequency Continuous (stationary and portable oxygen unit needed)   Oxygen delivery system Gas      01/10/20 0852         Follow-up Information    Croitoru, Dani Gobble, MD Follow up.   Specialty: Cardiology Why: Appointment: Jan 29, 2020 @ 3:45pm Contact information: [address] [place] [address] [phone]        [name], MD Follow up.   Specialty: Cardiology Why: Appointment: Jan 11, 2020 @ 8am Contact information: [id number] [place] [address] [phone]          No  Known Allergies  Consultations:  Cardiology  CCM  IR   Procedures/Studies: DG Chest 1 View  Result Date: 01/06/2020 CLINICAL DATA:  Post pulmonary arterial thrombectomy 12/30/2019. Shortness of breath. EXAM: CHEST  1 VIEW COMPARISON:  01/06/2020 FINDINGS: Lungs are adequately inflated demonstrate moderate hazy airspace opacification over the central lungs with possible slight interval worsening over the right mid to upper lung. Findings may be due to moderate interstitial edema versus infection. No effusion. Stable cardiomegaly. Remainder of the exam is unchanged. IMPRESSION: Slight interval worsening moderate hazy airspace process over the central lungs suggesting moderate interstitial edema and less likely infection. Electronically Signed   By: Marin Olp M.D.   On: 01/06/2020 09:13   CT Angio Chest PE W and/or Wo Contrast  Result Date: 12/29/2019 CLINICAL DATA:  Short of breath, right-sided chest and rib pain, recent history of trauma EXAM: CT ANGIOGRAPHY CHEST WITH CONTRAST TECHNIQUE: Multidetector CT imaging of the chest was performed using the standard protocol during bolus administration of intravenous contrast. Multiplanar CT image reconstructions and MIPs were obtained to evaluate the vascular anatomy. CONTRAST:  172mL OMNIPAQUE IOHEXOL 350 MG/ML SOLN COMPARISON:  None. FINDINGS: Cardiovascular: This is a technically adequate evaluation of the pulmonary vasculature. There are large bilateral pulmonary emboli filling the main pulmonary arteries. There is straightening of the interventricular septum and mild dilation of the right ventricle, with RV/LV ratio measuring 1.3. Along the nondependent surface of the right ventral, actually better visualized on the corresponding abdominal CT, there is likely a here mural thrombus. Echocardiography may be useful for further evaluation. No pericardial effusion. The thoracic aorta is normal in caliber without aneurysm or dissection.  Mediastinum/Nodes: No enlarged mediastinal, hilar, or axillary lymph nodes. Thyroid gland, trachea, and esophagus demonstrate no significant findings. Lungs/Pleura: There is background emphysema. Bilateral areas of airspace disease are seen within the dependent upper lobes and superior segment of the bilateral lower lobes. Ground-glass opacities are seen within the dependent lower lobes. No effusion or pneumothorax. Central airways are patent. Upper Abdomen: No acute abnormality. Musculoskeletal: No acute or destructive bony lesions. Reconstructed images demonstrate no additional findings. Review of the MIP images confirms the above findings. IMPRESSION: 1. Large bilateral pulmonary emboli with CT evidence of right heart strain (RV/LV Ratio = 1.3) consistent with at least submassive (intermediate risk) PE. The presence of right heart strain has been associated with an increased risk of morbidity and mortality. 2. Likely adherent mural thrombus along the anterior wall the right ventricle, better visualized on the corresponding abdominal CT. 3. Bilateral areas  of consolidation and ground-glass airspace disease, superimposed upon background emphysema. Findings favor pulmonary edema with developing pulmonary infarctions. 4.  Emphysema (ICD10-J43.9). These results were called by telephone at the time of interpretation on 12/29/2019 at 7:07 pm to provider Dr. Melina Copa, who verbally acknowledged these results. Electronically Signed   By: Randa Ngo M.D.   On: 12/29/2019 19:08   CT ABDOMEN PELVIS W CONTRAST  Result Date: 12/29/2019 CLINICAL DATA:  History of abdominal trauma, shortness of breath, right upper quadrant pain EXAM: CT ABDOMEN AND PELVIS WITH CONTRAST TECHNIQUE: Multidetector CT imaging of the abdomen and pelvis was performed using the standard protocol following bolus administration of intravenous contrast. CONTRAST:  117mL OMNIPAQUE IOHEXOL 350 MG/ML SOLN COMPARISON:  None. FINDINGS: Lower chest: Large  bilateral pulmonary emboli are seen. Diffuse ground-glass opacities throughout the lung bases. Please refer to corresponding CT chest report for important discussion of these findings. Hepatobiliary: No hepatic injury or perihepatic hematoma. Gallbladder is unremarkable Pancreas: Unremarkable. No pancreatic ductal dilatation or surrounding inflammatory changes. Spleen: No splenic injury or perisplenic hematoma. Adrenals/Urinary Tract: There are multiple wedge-shaped hypodensities within the left kidney consistent with renal infarcts. There is no surrounding fluid or perinephric fat stranding to suggest posttraumatic etiology. The right kidney enhances normally. The adrenals are unremarkable. Bladder is grossly normal. Stomach/Bowel: No bowel obstruction or ileus. Normal appendix right lower quadrant. No bowel wall thickening or inflammatory change. Vascular/Lymphatic: Minimal atherosclerosis of the abdominal aorta. The visualized vascular structures opacify normally. No pathologic adenopathy. Reproductive: Prostate is unremarkable. Other: No free fluid or free gas. No abdominal wall hernia. No evidence of soft tissue injury related to recent trauma. Musculoskeletal: No acute or destructive bony lesions. Reconstructed images demonstrate no additional findings. IMPRESSION: 1. Multiple left renal cortical infarcts. 2. Large bilateral pulmonary emboli. Please refer to corresponding CT chest report for important discussion of these findings. 3. No evidence of intra-abdominal or intrapelvic trauma. 4. Aortic Atherosclerosis (ICD10-I70.0). These results were called by telephone at the time of interpretation on 12/29/2019 at 7:03 pm to provider Dr. Melina Copa, who verbally acknowledged these results. Electronically Signed   By: Randa Ngo M.D.   On: 12/29/2019 19:08   IR Angiogram Pulmonary Bilateral Selective  Result Date: 12/30/2019 EXAM: ADDITIONAL ARTERIOGRAPHY; IR ULTRASOUND GUIDANCE VASC ACCESS RIGHT; THROMBECTOMY  MECHANICAL; BILATERAL PULMONARY ARTERIOGRAPHY COMPARISON:  CT a chest 12/29/2019 MEDICATIONS: 10000 units heparin ANESTHESIA/SEDATION: Versed 2.5 mg IV; Fentanyl 125 mcg IV Moderate Sedation Time:  107 minutes The patient was continuously monitored during the procedure by the interventional radiology nurse under my direct supervision. FLUOROSCOPY TIME:  Fluoroscopy Time: 28 minutes 54 seconds (344 mGy). COMPLICATIONS: None immediate. TECHNIQUE: Informed written consent was obtained from the patient after a thorough discussion of the procedural risks, benefits and alternatives. All questions were addressed. Maximal Sterile Barrier Technique was utilized including caps, mask, sterile gowns, sterile gloves, sterile drape, hand hygiene and skin antiseptic. A timeout was performed prior to the initiation of the procedure. The right common femoral vein was interrogated with ultrasound and found to be widely patent. An image was obtained and stored for the medical record. Local anesthesia was attained by infiltration with 1% lidocaine. A small dermatotomy was made. Under real-time sonographic guidance, the vessel was punctured with a 21 gauge micropuncture needle. Using standard technique, the initial micro needle was exchanged over a 0.018 micro wire for a transitional 4 [place] micro sheath. The micro sheath was then exchanged over a 0.035 wire for a 6 French 11 cm vascular sheath.  A right iliac venogram was performed through the sheath. No evidence of iliac or caval DVT. An angled pigtail catheter was advanced over a Bentson wire and navigated through the heart and into the main pulmonary artery. The main pulmonary arterial pressure was measured at 42/28 (mean 28) mm Hg. The catheter was navigated into the right main pulmonary artery. Pulmonary arteriography was performed. There is large volume thrombus within the right main pulmonary artery extending into the right lower lobe and segmental pulmonary arteries. Very poor  overall perfusion of the right middle and lower lobes. There is adequate perfusion of the right upper lobe. The pigtail catheter was exchanged over a rose in wire for an angled catheter. The angled catheter was successfully navigated into a right lower lobe peripheral branch. Pulmonary arteriography was performed confirming catheter location and that the vessel was of adequate size. An Amplatz wire was advanced. The catheter was removed. The 24 [place] Inari aspiration catheter was then successfully advanced over the wire and positioned in the right main pulmonary artery. Suction aspiration was performed. There was extirpation of thrombus material from the right lower lobe pulmonary artery. Follow-up pulmonary arteriography demonstrates persistent and likely chronic thrombus adherent to the inferior wall of the right main pulmonary artery. The Nitinol discs were advanced through the catheter and used to agitated the residual chronic thrombus. This was performed twice. Additional aspiration was performed with extirpation of additional thrombus. Follow-up venography demonstrates improved flow into the right lower lobe pulmonary artery with improved parenchymal blush, particularly in the right lower lobe. There is still persistent residual chronic thrombus along the inferior wall of the right lower lobe pulmonary artery. The aspiration catheter was brought back into the main pulmonary artery. The angled catheter was introduced coaxially in used to select the left main pulmonary artery. Arteriography was performed. There is large volume nearly occlusive thrombus in the left main pulmonary artery extending into the left lower pulmonary artery. The angled catheter was successfully advanced into a peripheral branch of the left lower lobe pulmonary artery. Arteriography was performed again confirming catheter location and terminal pulmonary arterial size. The Amplatz wire was advanced into the artery. The catheter was  removed. The 76 French aspiration catheter was then advanced over the wire. Aspiration was performed without successful thrombus retrieval. Therefore, the 16 French aspiration catheter was advanced coaxially through the 24 French catheter and used to engage the thrombus. Aspiration was performed with successful extirpation of acute and chronic thrombus material. Follow-up arteriography demonstrates improved flow in the lower lobe pulmonary artery with increased parenchymal blush. There is some residual chronic thrombus remaining. However, at this point in the case, blood loss was approximately 500 mL. Repeat pulmonary arterial pressures were obtained. The mean main pulmonary arterial pressure has decreased 24 mm Hg. Patient is doing well and hemodynamically stable. The decision was made to pursue no further attempts at extirpation of the residual chronic thrombus. FINDINGS: Pre thrombectomy main mean pulmonary arterial pressure 28 mm Hg. Post extirpation mean pulmonary arterial pressure 24 mm Hg. IMPRESSION: 1. Large volume bilateral pulmonary emboli with resultant pulmonary arterial hypertension. 2. Successful extirpation of acute and chronic thrombus material from the bilateral main and lower lobe pulmonary arteries. 3. Improved pulmonary arterial hypertension. Signed, Criselda Peaches, MD, [facility] Electronically Signed   By: Jacqulynn Cadet M.D.   On: 12/30/2019 15:41   IR Angiogram Selective Each Additional Vessel  Result Date: 12/30/2019 EXAM: ADDITIONAL ARTERIOGRAPHY; IR ULTRASOUND GUIDANCE  VASC ACCESS RIGHT; THROMBECTOMY MECHANICAL; BILATERAL PULMONARY ARTERIOGRAPHY COMPARISON:  CT a chest 12/29/2019 MEDICATIONS: 10000 units heparin ANESTHESIA/SEDATION: Versed 2.5 mg IV; Fentanyl 125 mcg IV Moderate Sedation Time:  107 minutes The patient was continuously monitored during the procedure by the interventional radiology nurse under my  direct supervision. FLUOROSCOPY TIME:  Fluoroscopy Time: 28 minutes 54 seconds (344 mGy). COMPLICATIONS: None immediate. TECHNIQUE: Informed written consent was obtained from the patient after a thorough discussion of the procedural risks, benefits and alternatives. All questions were addressed. Maximal Sterile Barrier Technique was utilized including caps, mask, sterile gowns, sterile gloves, sterile drape, hand hygiene and skin antiseptic. A timeout was performed prior to the initiation of the procedure. The right common femoral vein was interrogated with ultrasound and found to be widely patent. An image was obtained and stored for the medical record. Local anesthesia was attained by infiltration with 1% lidocaine. A small dermatotomy was made. Under real-time sonographic guidance, the vessel was punctured with a 21 gauge micropuncture needle. Using standard technique, the initial micro needle was exchanged over a 0.018 micro wire for a transitional 4 [place] micro sheath. The micro sheath was then exchanged over a 0.035 wire for a 6 French 11 cm vascular sheath. A right iliac venogram was performed through the sheath. No evidence of iliac or caval DVT. An angled pigtail catheter was advanced over a Bentson wire and navigated through the heart and into the main pulmonary artery. The main pulmonary arterial pressure was measured at 42/28 (mean 28) mm Hg. The catheter was navigated into the right main pulmonary artery. Pulmonary arteriography was performed. There is large volume thrombus within the right main pulmonary artery extending into the right lower lobe and segmental pulmonary arteries. Very poor overall perfusion of the right middle and lower lobes. There is adequate perfusion of the right upper lobe. The pigtail catheter was exchanged over a rose in wire for an angled catheter. The angled catheter was successfully navigated into a right lower lobe peripheral branch. Pulmonary arteriography was performed  confirming catheter location and that the vessel was of adequate size. An Amplatz wire was advanced. The catheter was removed. The 24 [place] Inari aspiration catheter was then successfully advanced over the wire and positioned in the right main pulmonary artery. Suction aspiration was performed. There was extirpation of thrombus material from the right lower lobe pulmonary artery. Follow-up pulmonary arteriography demonstrates persistent and likely chronic thrombus adherent to the inferior wall of the right main pulmonary artery. The Nitinol discs were advanced through the catheter and used to agitated the residual chronic thrombus. This was performed twice. Additional aspiration was performed with extirpation of additional thrombus. Follow-up venography demonstrates improved flow into the right lower lobe pulmonary artery with improved parenchymal blush, particularly in the right lower lobe. There is still persistent residual chronic thrombus along the inferior wall of the right lower lobe pulmonary artery. The aspiration catheter was brought back into the main pulmonary artery. The angled catheter was introduced coaxially in used to select the left main pulmonary artery. Arteriography was performed. There is large volume nearly occlusive thrombus in the left main pulmonary artery extending into the left lower pulmonary artery. The angled catheter was successfully advanced into a peripheral branch of the left lower lobe pulmonary artery. Arteriography was performed again confirming catheter location and terminal pulmonary arterial size. The Amplatz wire was advanced into the artery. The catheter was removed. The 19 French aspiration catheter was then advanced over the wire. Aspiration was performed  without successful thrombus retrieval. Therefore, the 16 French aspiration catheter was advanced coaxially through the 24 [place] catheter and used to engage the thrombus. Aspiration was performed with successful  extirpation of acute and chronic thrombus material. Follow-up arteriography demonstrates improved flow in the lower lobe pulmonary artery with increased parenchymal blush. There is some residual chronic thrombus remaining. However, at this point in the case, blood loss was approximately 500 mL. Repeat pulmonary arterial pressures were obtained. The mean main pulmonary arterial pressure has decreased 24 mm Hg. Patient is doing well and hemodynamically stable. The decision was made to pursue no further attempts at extirpation of the residual chronic thrombus. FINDINGS: Pre thrombectomy main mean pulmonary arterial pressure 28 mm Hg. Post extirpation mean pulmonary arterial pressure 24 mm Hg. IMPRESSION: 1. Large volume bilateral pulmonary emboli with resultant pulmonary arterial hypertension. 2. Successful extirpation of acute and chronic thrombus material from the bilateral main and lower lobe pulmonary arteries. 3. Improved pulmonary arterial hypertension. Signed, Criselda Peaches, MD, [facility] Electronically Signed   By: Jacqulynn Cadet M.D.   On: 12/30/2019 15:41   IR Angiogram Selective Each Additional Vessel  Result Date: 12/30/2019 EXAM: ADDITIONAL ARTERIOGRAPHY; IR ULTRASOUND GUIDANCE VASC ACCESS RIGHT; THROMBECTOMY MECHANICAL; BILATERAL PULMONARY ARTERIOGRAPHY COMPARISON:  CT a chest 12/29/2019 MEDICATIONS: 10000 units heparin ANESTHESIA/SEDATION: Versed 2.5 mg IV; Fentanyl 125 mcg IV Moderate Sedation Time:  107 minutes The patient was continuously monitored during the procedure by the interventional radiology nurse under my direct supervision. FLUOROSCOPY TIME:  Fluoroscopy Time: 28 minutes 54 seconds (344 mGy). COMPLICATIONS: None immediate. TECHNIQUE: Informed written consent was obtained from the patient after a thorough discussion of the procedural risks, benefits and alternatives. All questions were addressed. Maximal Sterile  Barrier Technique was utilized including caps, mask, sterile gowns, sterile gloves, sterile drape, hand hygiene and skin antiseptic. A timeout was performed prior to the initiation of the procedure. The right common femoral vein was interrogated with ultrasound and found to be widely patent. An image was obtained and stored for the medical record. Local anesthesia was attained by infiltration with 1% lidocaine. A small dermatotomy was made. Under real-time sonographic guidance, the vessel was punctured with a 21 gauge micropuncture needle. Using standard technique, the initial micro needle was exchanged over a 0.018 micro wire for a transitional 4 [place] micro sheath. The micro sheath was then exchanged over a 0.035 wire for a 6 French 11 cm vascular sheath. A right iliac venogram was performed through the sheath. No evidence of iliac or caval DVT. An angled pigtail catheter was advanced over a Bentson wire and navigated through the heart and into the main pulmonary artery. The main pulmonary arterial pressure was measured at 42/28 (mean 28) mm Hg. The catheter was navigated into the right main pulmonary artery. Pulmonary arteriography was performed. There is large volume thrombus within the right main pulmonary artery extending into the right lower lobe and segmental pulmonary arteries. Very poor overall perfusion of the right middle and lower lobes. There is adequate perfusion of the right upper lobe. The pigtail catheter was exchanged over a rose in wire for an angled catheter. The angled catheter was successfully navigated into a right lower lobe peripheral branch. Pulmonary arteriography was performed confirming catheter location and that the vessel was of adequate size. An Amplatz wire was advanced. The catheter was removed. The 24 [place] Inari aspiration catheter was then successfully advanced over the wire and positioned in the right main pulmonary  artery. Suction aspiration was performed. There was  extirpation of thrombus material from the right lower lobe pulmonary artery. Follow-up pulmonary arteriography demonstrates persistent and likely chronic thrombus adherent to the inferior wall of the right main pulmonary artery. The Nitinol discs were advanced through the catheter and used to agitated the residual chronic thrombus. This was performed twice. Additional aspiration was performed with extirpation of additional thrombus. Follow-up venography demonstrates improved flow into the right lower lobe pulmonary artery with improved parenchymal blush, particularly in the right lower lobe. There is still persistent residual chronic thrombus along the inferior wall of the right lower lobe pulmonary artery. The aspiration catheter was brought back into the main pulmonary artery. The angled catheter was introduced coaxially in used to select the left main pulmonary artery. Arteriography was performed. There is large volume nearly occlusive thrombus in the left main pulmonary artery extending into the left lower pulmonary artery. The angled catheter was successfully advanced into a peripheral branch of the left lower lobe pulmonary artery. Arteriography was performed again confirming catheter location and terminal pulmonary arterial size. The Amplatz wire was advanced into the artery. The catheter was removed. The 60 French aspiration catheter was then advanced over the wire. Aspiration was performed without successful thrombus retrieval. Therefore, the 16 French aspiration catheter was advanced coaxially through the 24 French catheter and used to engage the thrombus. Aspiration was performed with successful extirpation of acute and chronic thrombus material. Follow-up arteriography demonstrates improved flow in the lower lobe pulmonary artery with increased parenchymal blush. There is some residual chronic thrombus remaining. However, at this point in the case, blood loss was approximately 500 mL. Repeat pulmonary  arterial pressures were obtained. The mean main pulmonary arterial pressure has decreased 24 mm Hg. Patient is doing well and hemodynamically stable. The decision was made to pursue no further attempts at extirpation of the residual chronic thrombus. FINDINGS: Pre thrombectomy main mean pulmonary arterial pressure 28 mm Hg. Post extirpation mean pulmonary arterial pressure 24 mm Hg. IMPRESSION: 1. Large volume bilateral pulmonary emboli with resultant pulmonary arterial hypertension. 2. Successful extirpation of acute and chronic thrombus material from the bilateral main and lower lobe pulmonary arteries. 3. Improved pulmonary arterial hypertension. Signed, Criselda Peaches, MD, [place] [facility] Electronically Signed   By: Jacqulynn Cadet M.D.   On: 12/30/2019 15:41   CARDIAC CATHETERIZATION  Result Date: 01/02/2020  Normal coronary arteries.  No evidence of left-to-right shunting by oximetry.  Mild elevation in pulmonary artery pressures.  Technically unable to obtain pulmonary capillary wedge pressure.  Normal left ventricular end-diastolic pressure. RECOMMENDATIONS:  Resume anticoagulation therapy for pulmonary embolism as soon as possible.  IR THROMBECT PRIM MECH INIT (INCLU) MOD SED  Result Date: 12/30/2019 EXAM: ADDITIONAL ARTERIOGRAPHY; IR ULTRASOUND GUIDANCE VASC ACCESS RIGHT; THROMBECTOMY MECHANICAL; BILATERAL PULMONARY ARTERIOGRAPHY COMPARISON:  CT a chest 12/29/2019 MEDICATIONS: 10000 units heparin ANESTHESIA/SEDATION: Versed 2.5 mg IV; Fentanyl 125 mcg IV Moderate Sedation Time:  107 minutes The patient was continuously monitored during the procedure by the interventional radiology nurse under my direct supervision. FLUOROSCOPY TIME:  Fluoroscopy Time: 28 minutes 54 seconds (344 mGy). COMPLICATIONS: None immediate. TECHNIQUE: Informed written consent was obtained from the patient after a thorough discussion of the procedural  risks, benefits and alternatives. All questions were addressed. Maximal Sterile Barrier Technique was utilized including caps, mask, sterile gowns, sterile gloves, sterile drape, hand hygiene and skin antiseptic. A timeout was performed prior to the initiation of the procedure. The  right common femoral vein was interrogated with ultrasound and found to be widely patent. An image was obtained and stored for the medical record. Local anesthesia was attained by infiltration with 1% lidocaine. A small dermatotomy was made. Under real-time sonographic guidance, the vessel was punctured with a 21 gauge micropuncture needle. Using standard technique, the initial micro needle was exchanged over a 0.018 micro wire for a transitional 4 [place] micro sheath. The micro sheath was then exchanged over a 0.035 wire for a 6 French 11 cm vascular sheath. A right iliac venogram was performed through the sheath. No evidence of iliac or caval DVT. An angled pigtail catheter was advanced over a Bentson wire and navigated through the heart and into the main pulmonary artery. The main pulmonary arterial pressure was measured at 42/28 (mean 28) mm Hg. The catheter was navigated into the right main pulmonary artery. Pulmonary arteriography was performed. There is large volume thrombus within the right main pulmonary artery extending into the right lower lobe and segmental pulmonary arteries. Very poor overall perfusion of the right middle and lower lobes. There is adequate perfusion of the right upper lobe. The pigtail catheter was exchanged over a rose in wire for an angled catheter. The angled catheter was successfully navigated into a right lower lobe peripheral branch. Pulmonary arteriography was performed confirming catheter location and that the vessel was of adequate size. An Amplatz wire was advanced. The catheter was removed. The 24 [place] Inari aspiration catheter was then successfully advanced over the wire and positioned in the  right main pulmonary artery. Suction aspiration was performed. There was extirpation of thrombus material from the right lower lobe pulmonary artery. Follow-up pulmonary arteriography demonstrates persistent and likely chronic thrombus adherent to the inferior wall of the right main pulmonary artery. The Nitinol discs were advanced through the catheter and used to agitated the residual chronic thrombus. This was performed twice. Additional aspiration was performed with extirpation of additional thrombus. Follow-up venography demonstrates improved flow into the right lower lobe pulmonary artery with improved parenchymal blush, particularly in the right lower lobe. There is still persistent residual chronic thrombus along the inferior wall of the right lower lobe pulmonary artery. The aspiration catheter was brought back into the main pulmonary artery. The angled catheter was introduced coaxially in used to select the left main pulmonary artery. Arteriography was performed. There is large volume nearly occlusive thrombus in the left main pulmonary artery extending into the left lower pulmonary artery. The angled catheter was successfully advanced into a peripheral branch of the left lower lobe pulmonary artery. Arteriography was performed again confirming catheter location and terminal pulmonary arterial size. The Amplatz wire was advanced into the artery. The catheter was removed. The 59 French aspiration catheter was then advanced over the wire. Aspiration was performed without successful thrombus retrieval. Therefore, the 16 French aspiration catheter was advanced coaxially through the 24 French catheter and used to engage the thrombus. Aspiration was performed with successful extirpation of acute and chronic thrombus material. Follow-up arteriography demonstrates improved flow in the lower lobe pulmonary artery with increased parenchymal blush. There is some residual chronic thrombus remaining. However, at this  point in the case, blood loss was approximately 500 mL. Repeat pulmonary arterial pressures were obtained. The mean main pulmonary arterial pressure has decreased 24 mm Hg. Patient is doing well and hemodynamically stable. The decision was made to pursue no further attempts at extirpation of the residual chronic thrombus. FINDINGS: Pre thrombectomy main mean pulmonary arterial pressure 28  mm Hg. Post extirpation mean pulmonary arterial pressure 24 mm Hg. IMPRESSION: 1. Large volume bilateral pulmonary emboli with resultant pulmonary arterial hypertension. 2. Successful extirpation of acute and chronic thrombus material from the bilateral main and lower lobe pulmonary arteries. 3. Improved pulmonary arterial hypertension. Signed, Criselda Peaches, MD, [place] [facility] Electronically Signed   By: Jacqulynn Cadet M.D.   On: 12/30/2019 15:41   IR THROMBECT PRIM MECH ADD (INCLU) MOD SED  Result Date: 12/30/2019 EXAM: ADDITIONAL ARTERIOGRAPHY; IR ULTRASOUND GUIDANCE VASC ACCESS RIGHT; THROMBECTOMY MECHANICAL; BILATERAL PULMONARY ARTERIOGRAPHY COMPARISON:  CT a chest 12/29/2019 MEDICATIONS: 10000 units heparin ANESTHESIA/SEDATION: Versed 2.5 mg IV; Fentanyl 125 mcg IV Moderate Sedation Time:  107 minutes The patient was continuously monitored during the procedure by the interventional radiology nurse under my direct supervision. FLUOROSCOPY TIME:  Fluoroscopy Time: 28 minutes 54 seconds (344 mGy). COMPLICATIONS: None immediate. TECHNIQUE: Informed written consent was obtained from the patient after a thorough discussion of the procedural risks, benefits and alternatives. All questions were addressed. Maximal Sterile Barrier Technique was utilized including caps, mask, sterile gowns, sterile gloves, sterile drape, hand hygiene and skin antiseptic. A timeout was performed prior to the initiation of the procedure. The right common femoral vein was  interrogated with ultrasound and found to be widely patent. An image was obtained and stored for the medical record. Local anesthesia was attained by infiltration with 1% lidocaine. A small dermatotomy was made. Under real-time sonographic guidance, the vessel was punctured with a 21 gauge micropuncture needle. Using standard technique, the initial micro needle was exchanged over a 0.018 micro wire for a transitional 4 [place] micro sheath. The micro sheath was then exchanged over a 0.035 wire for a 6 French 11 cm vascular sheath. A right iliac venogram was performed through the sheath. No evidence of iliac or caval DVT. An angled pigtail catheter was advanced over a Bentson wire and navigated through the heart and into the main pulmonary artery. The main pulmonary arterial pressure was measured at 42/28 (mean 28) mm Hg. The catheter was navigated into the right main pulmonary artery. Pulmonary arteriography was performed. There is large volume thrombus within the right main pulmonary artery extending into the right lower lobe and segmental pulmonary arteries. Very poor overall perfusion of the right middle and lower lobes. There is adequate perfusion of the right upper lobe. The pigtail catheter was exchanged over a rose in wire for an angled catheter. The angled catheter was successfully navigated into a right lower lobe peripheral branch. Pulmonary arteriography was performed confirming catheter location and that the vessel was of adequate size. An Amplatz wire was advanced. The catheter was removed. The 24 [place] Inari aspiration catheter was then successfully advanced over the wire and positioned in the right main pulmonary artery. Suction aspiration was performed. There was extirpation of thrombus material from the right lower lobe pulmonary artery. Follow-up pulmonary arteriography demonstrates persistent and likely chronic thrombus adherent to the inferior wall of the right main pulmonary artery. The Nitinol  discs were advanced through the catheter and used to agitated the residual chronic thrombus. This was performed twice. Additional aspiration was performed with extirpation of additional thrombus. Follow-up venography demonstrates improved flow into the right lower lobe pulmonary artery with improved parenchymal blush, particularly in the right lower lobe. There is still persistent residual chronic thrombus along the inferior wall of the right lower lobe pulmonary artery. The aspiration catheter was brought back into the main pulmonary artery.  The angled catheter was introduced coaxially in used to select the left main pulmonary artery. Arteriography was performed. There is large volume nearly occlusive thrombus in the left main pulmonary artery extending into the left lower pulmonary artery. The angled catheter was successfully advanced into a peripheral branch of the left lower lobe pulmonary artery. Arteriography was performed again confirming catheter location and terminal pulmonary arterial size. The Amplatz wire was advanced into the artery. The catheter was removed. The 86 French aspiration catheter was then advanced over the wire. Aspiration was performed without successful thrombus retrieval. Therefore, the 16 French aspiration catheter was advanced coaxially through the 24 French catheter and used to engage the thrombus. Aspiration was performed with successful extirpation of acute and chronic thrombus material. Follow-up arteriography demonstrates improved flow in the lower lobe pulmonary artery with increased parenchymal blush. There is some residual chronic thrombus remaining. However, at this point in the case, blood loss was approximately 500 mL. Repeat pulmonary arterial pressures were obtained. The mean main pulmonary arterial pressure has decreased 24 mm Hg. Patient is doing well and hemodynamically stable. The decision was made to pursue no further attempts at extirpation of the residual chronic  thrombus. FINDINGS: Pre thrombectomy main mean pulmonary arterial pressure 28 mm Hg. Post extirpation mean pulmonary arterial pressure 24 mm Hg. IMPRESSION: 1. Large volume bilateral pulmonary emboli with resultant pulmonary arterial hypertension. 2. Successful extirpation of acute and chronic thrombus material from the bilateral main and lower lobe pulmonary arteries. 3. Improved pulmonary arterial hypertension. Signed, Criselda Peaches, MD, [place] [facility]iology Electronically Signed   By: Jacqulynn Cadet M.D.   On: 12/30/2019 15:41   IR US Guide Vasc Access Right  Result Date: 12/30/2019 EXAM: ADDITIONAL ARTERIOGRAPHY; IR ULTRASOUND GUIDANCE VASC ACCESS RIGHT; THROMBECTOMY MECHANICAL; BILATERAL PULMONARY ARTERIOGRAPHY COMPARISON:  CT a chest 12/29/2019 MEDICATIONS: 10000 units heparin ANESTHESIA/SEDATION: Versed 2.5 mg IV; Fentanyl 125 mcg IV Moderate Sedation Time:  107 minutes The patient was continuously monitored during the procedure by the interventional radiology nurse under my direct supervision. FLUOROSCOPY TIME:  Fluoroscopy Time: 28 minutes 54 seconds (344 mGy). COMPLICATIONS: None immediate. TECHNIQUE: Informed written consent was obtained from the patient after a thorough discussion of the procedural risks, benefits and alternatives. All questions were addressed. Maximal Sterile Barrier Technique was utilized including caps, mask, sterile gowns, sterile gloves, sterile drape, hand hygiene and skin antiseptic. A timeout was performed prior to the initiation of the procedure. The right common femoral vein was interrogated with ultrasound and found to be widely patent. An image was obtained and stored for the medical record. Local anesthesia was attained by infiltration with 1% lidocaine. A small dermatotomy was made. Under real-time sonographic guidance, the vessel was punctured with a 21 gauge micropuncture needle. Using standard technique,  the initial micro needle was exchanged over a 0.018 micro wire for a transitional 4 [place] micro sheath. The micro sheath was then exchanged over a 0.035 wire for a 6 French 11 cm vascular sheath. A right iliac venogram was performed through the sheath. No evidence of iliac or caval DVT. An angled pigtail catheter was advanced over a Bentson wire and navigated through the heart and into the main pulmonary artery. The main pulmonary arterial pressure was measured at 42/28 (mean 28) mm Hg. The catheter was navigated into the right main pulmonary artery. Pulmonary arteriography was performed. There is large volume thrombus within the right main pulmonary artery extending into the right lower lobe and segmental  pulmonary arteries. Very poor overall perfusion of the right middle and lower lobes. There is adequate perfusion of the right upper lobe. The pigtail catheter was exchanged over a rose in wire for an angled catheter. The angled catheter was successfully navigated into a right lower lobe peripheral branch. Pulmonary arteriography was performed confirming catheter location and that the vessel was of adequate size. An Amplatz wire was advanced. The catheter was removed. The 24 [place] Inari aspiration catheter was then successfully advanced over the wire and positioned in the right main pulmonary artery. Suction aspiration was performed. There was extirpation of thrombus material from the right lower lobe pulmonary artery. Follow-up pulmonary arteriography demonstrates persistent and likely chronic thrombus adherent to the inferior wall of the right main pulmonary artery. The Nitinol discs were advanced through the catheter and used to agitated the residual chronic thrombus. This was performed twice. Additional aspiration was performed with extirpation of additional thrombus. Follow-up venography demonstrates improved flow into the right lower lobe pulmonary artery with improved parenchymal blush, particularly in the  right lower lobe. There is still persistent residual chronic thrombus along the inferior wall of the right lower lobe pulmonary artery. The aspiration catheter was brought back into the main pulmonary artery. The angled catheter was introduced coaxially in used to select the left main pulmonary artery. Arteriography was performed. There is large volume nearly occlusive thrombus in the left main pulmonary artery extending into the left lower pulmonary artery. The angled catheter was successfully advanced into a peripheral branch of the left lower lobe pulmonary artery. Arteriography was performed again confirming catheter location and terminal pulmonary arterial size. The Amplatz wire was advanced into the artery. The catheter was removed. The 15 French aspiration catheter was then advanced over the wire. Aspiration was performed without successful thrombus retrieval. Therefore, the 16 French aspiration catheter was advanced coaxially through the 24 French catheter and used to engage the thrombus. Aspiration was performed with successful extirpation of acute and chronic thrombus material. Follow-up arteriography demonstrates improved flow in the lower lobe pulmonary artery with increased parenchymal blush. There is some residual chronic thrombus remaining. However, at this point in the case, blood loss was approximately 500 mL. Repeat pulmonary arterial pressures were obtained. The mean main pulmonary arterial pressure has decreased 24 mm Hg. Patient is doing well and hemodynamically stable. The decision was made to pursue no further attempts at extirpation of the residual chronic thrombus. FINDINGS: Pre thrombectomy main mean pulmonary arterial pressure 28 mm Hg. Post extirpation mean pulmonary arterial pressure 24 mm Hg. IMPRESSION: 1. Large volume bilateral pulmonary emboli with resultant pulmonary arterial hypertension. 2. Successful extirpation of acute and chronic thrombus material from the bilateral main and  lower lobe pulmonary arteries. 3. Improved pulmonary arterial hypertension. Signed, Criselda Peaches, MD, [place] [facility] Electronically Signed   By: Jacqulynn Cadet M.D.   On: 12/30/2019 15:41   DG CHEST PORT 1 VIEW  Result Date: 01/06/2020 CLINICAL DATA:  Tachypnea EXAM: PORTABLE CHEST 1 VIEW COMPARISON:  12/29/2019 FINDINGS: Mild bilateral interstitial pulmonary edema. No pleural effusion or pneumothorax. No focal airspace consolidation. Mild cardiomegaly. IMPRESSION: Mild interstitial pulmonary edema. Electronically Signed   By: Ulyses Jarred M.D.   On: 01/06/2020 01:55   DG Chest Port 1 View  Result Date: 12/29/2019 CLINICAL DATA:  Hypoxia shortness of breath. EXAM: PORTABLE CHEST 1 VIEW COMPARISON:  07/25/2018 FINDINGS: Globular appearance of cardiac silhouette with cardiac enlargement that is likely also accentuated by portable  technique. Hilar structures are unremarkable. Patchy opacities in the RIGHT and LEFT mid chest. No dense consolidation. No sign of pleural effusion. Visualized skeletal structures are unremarkable. IMPRESSION: Patchy opacities in the RIGHT and LEFT mid chest, potentially related to developing infection, viral or atypical process. Given history of trauma on the side of trauma small area of contusion is also considered. Globular cardiac silhouette.  Pericardial effusion not excluded. Electronically Signed   By: Zetta Bills M.D.   On: 12/29/2019 17:32   ECHOCARDIOGRAM COMPLETE  Result Date: 12/30/2019    ECHOCARDIOGRAM LIMITED REPORT   Patient Name:   Cody Joyce Date of Exam: 12/30/2019 Medical Rec #:  [id number]       Height:       73.0 in Accession #:    [id number]      Weight:       185.9 lb Date of Birth:  [birth year]        BSA:          2.086 m Patient Age:    33 years        BP:           142/106 mmHg Patient Gender: M               HR:           90 bpm. Exam Location:  Inpatient Procedure: 2D Echo,  Cardiac Doppler and Color Doppler Indications:    I26.02 Pulmonary embolus  History:        Patient has no prior history of Echocardiogram examinations.  Sonographer:    Merrie Roof RDCS Referring Phys: [id number] [place]  1. Left ventricular ejection fraction, by estimation, is 35 to 40%. The left ventricle has moderately decreased function. The left ventricle demonstrates global hypokinesis. Left ventricular diastolic parameters are consistent with Grade I diastolic dysfunction (impaired relaxation). There is the interventricular septum is flattened in systole and diastole, consistent with right ventricular pressure and volume overload.  2. Right ventricular systolic function is moderately reduced.  3. Right atrial size was moderately dilated.  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 Trivial mitral valve regurgitation.  5. Unable to obtain adequate Doppler signal to estimate pulmonary pressures. Tricuspid valve regurgitation is trivial. FINDINGS  Left Ventricle: Left ventricular ejection fraction, by estimation, is 35 to 40%. The left ventricle has moderately decreased function. The left ventricle demonstrates global hypokinesis. The interventricular septum is flattened in systole and diastole, consistent with right ventricular pressure and volume overload. Right Ventricle: Right ventricular systolic function is moderately reduced. Right Atrium: Right atrial size was moderately dilated. Mitral Valve: The mitral valve is myxomatous. There is moderate thickening of the mitral valve leaflet(s). There is moderate calcification of the mitral valve leaflet(s). Trivial mitral valve regurgitation. Tricuspid Valve: Unable to obtain adequate Doppler signal to estimate pulmonary pressures. The tricuspid valve is normal in structure. Tricuspid valve regurgitation is trivial. Pulmonic Valve: Pulmonic valve regurgitation is mild.  LEFT VENTRICLE PLAX 2D LVIDd:         4.40 cm     Diastology LVIDs:         4.50 cm      LV e' lateral:   6.09 cm/s LV PW:         0.90 cm     LV E/e' lateral: 5.6 LV IVS:        1.00 cm     LV e' medial:    4.68 cm/s LVOT diam:  2.00 cm     LV E/e' medial:  7.2 LV SV:         30 LV SV Index:   14 LVOT Area:     3.14 cm  LV Volumes (MOD) LV vol d, MOD A2C: 97.5 ml LV vol d, MOD A4C: 92.5 ml LV vol s, MOD A2C: 71.5 ml LV vol s, MOD A4C: 47.8 ml LV SV MOD A2C:     26.0 ml LV SV MOD A4C:     92.5 ml LV SV MOD BP:      37.6 ml RIGHT VENTRICLE            IVC RV Basal diam:  5.20 cm    IVC diam: 2.00 cm RV Mid diam:    4.90 cm RV S prime:     9.68 cm/s TAPSE (M-mode): 1.4 cm LEFT ATRIUM             Index       RIGHT ATRIUM           Index LA diam:        3.60 cm 1.73 cm/m  RA Area:     24.50 cm LA Vol (A2C):   71.4 ml 34.23 ml/m RA Volume:   89.10 ml  42.72 ml/m LA Vol (A4C):   32.8 ml 15.73 ml/m LA Biplane Vol: 49.0 ml 23.49 ml/m  AORTIC VALVE LVOT Vmax:   66.60 cm/s LVOT Vmean:  36.200 cm/s LVOT VTI:    0.096 m  AORTA Ao Root diam: 3.20 cm Ao Asc diam:  3.00 cm MITRAL VALVE MV Area (PHT): 5.23 cm    SHUNTS MV Decel Time: 145 msec    Systemic VTI:  0.10 m MV E velocity: 33.80 cm/s  Systemic Diam: 2.00 cm MV A velocity: 55.70 cm/s MV E/A ratio:  0.61 Candee Furbish MD Electronically signed by Candee Furbish MD Signature Date/Time: 12/30/2019/2:26:36 PM    Final    ECHOCARDIOGRAM LIMITED BUBBLE STUDY  Result Date: 12/31/2019    ECHOCARDIOGRAM LIMITED REPORT   Patient Name:   Cody Joyce Date of Exam: 12/31/2019 Medical Rec #:  [id number]       Height:       73.0 in Accession #:    [id number]      Weight:       174.8 lb Date of Birth:  21-Dec-1960        BSA:          2.032 m Patient Age:    65 years        BP:           143/99 mmHg Patient Gender: M               HR:           91 bpm. Exam Location:  Inpatient Procedure: Limited Echo and Saline Contrast Bubble Study Indications:    Renal infarct  History:        Patient has prior history of Echocardiogram examinations, most                 recent  12/30/2019. COPD and Pulomary embolus;                 Signs/Symptoms:Shortness of Breath and Chest Pain. Resp.                 failure.  Sonographer:    Dustin Flock Referring Phys: [id number] Lance Coon R ELTARABOULSI IMPRESSIONS  1. Agitated saline contrast  bubble study was positive with shunting observed within 5 cardiac cycles suggestive of interatrial shunt.  2. Right ventricular systolic function is mildly reduced. The right ventricular size is moderately enlarged.  3. Remainder of findings per transthoracic echocardiogram 12/30/19. FINDINGS  Right Ventricle: The right ventricular size is moderately enlarged. Right ventricular systolic function is mildly reduced. IAS/Shunts: There is left bowing of the interatrial septum, suggestive of elevated right atrial pressure. Agitated saline contrast was given intravenously to evaluate for intracardiac shunting. Agitated saline contrast bubble study was positive with shunting observed within 3-6 cardiac cycles suggestive of interatrial shunt. Cherlynn Kaiser MD Electronically signed by Cherlynn Kaiser MD Signature Date/Time: 12/31/2019/6:16:01 PM    Final    ECHO TEE  Result Date: 01/03/2020    TRANSESOPHOGEAL ECHO REPORT   Patient Name:   Cody Joyce Date of Exam: 01/03/2020 Medical Rec #:  [id number]       Height:       72.0 in Accession #:    [id number]      Weight:       185.0 lb Date of Birth:  [birth year]        BSA:          2.061 m Patient Age:    77 years        BP:           79/61 mmHg Patient Gender: M               HR:           77 bpm. Exam Location:  Inpatient Procedure: Transesophageal Echo, Cardiac Doppler, Color Doppler, Saline Contrast            Bubble Study and Intracardiac Opacification Agent Indications:     possible atrial septal defect  History:         Patient has prior history of Echocardiogram examinations, most                  recent 12/31/2019. Pulmonary embolus.  Sonographer:     Johny Chess Referring Phys:  [id number] [place]  Diagnosing Phys: Ena Dawley MD PROCEDURE: After discussion of the risks and benefits of a TEE, an informed consent was obtained from the patient. The transesophogeal probe was passed without difficulty through the esophogus of the patient. Sedation performed by different physician. The patient was monitored while under deep sedation. Anesthestetic sedation was provided intravenously by Anesthesiology: 172.5mg  of Propofol. The patient's vital signs; including heart rate, blood pressure, and oxygen saturation; remained stable throughout the procedure. The patient developed no complications during the procedure. IMPRESSIONS  1. There is a smoke in the left ventricular apex with no evidence for a thrombus on Definity echo contrast images.. Left ventricular ejection fraction, by estimation, is 30 to 35%. The left ventricle has moderately decreased function. The left ventricle  demonstrates global hypokinesis. There is the interventricular septum is flattened in systole and diastole, consistent with right ventricular pressure and volume overload.  2. Right ventricular systolic function is severely reduced. The right ventricular size is severely enlarged. There is normal pulmonary artery systolic pressure.  3. Left atrial size was moderately dilated. No left atrial/left atrial appendage thrombus was detected.  4. Right atrial size was moderately dilated.  5. The mitral valve is normal in structure. Mild mitral valve regurgitation. No evidence of mitral stenosis.  6. The aortic valve is normal in structure. Aortic valve regurgitation is not visualized. No aortic stenosis is present.  7.  The inferior vena cava is normal in size with greater than 50% respiratory variability, suggesting right atrial pressure of 3 mmHg.  8. Agitated saline contrast bubble study was significantly positive with shunting observed within the first cardiac cycles suggestive of interatrial shunt. There is a large patent foramen ovale with  bidirectional shunting across atrial septum. Conclusion(s)/Recommendation(s): Findings are concerning for an interatrial shunt as detailed above. FINDINGS  Left Ventricle: There is a smoke in the left ventricular apex with no evidence for a thrombus on Definity echo contrast images. Left ventricular ejection fraction, by estimation, is 30 to 35%. The left ventricle has moderately decreased function. The left ventricle demonstrates global hypokinesis. Definity contrast agent was given IV to delineate the left ventricular endocardial borders. The left ventricular internal cavity size was normal in size. There is no left ventricular hypertrophy. The interventricular septum is flattened in systole and diastole, consistent with right ventricular pressure and volume overload. Right Ventricle: The right ventricular size is severely enlarged. No increase in right ventricular wall thickness. Right ventricular systolic function is severely reduced. There is normal pulmonary artery systolic pressure. The tricuspid regurgitant velocity is 2.41 m/s, and with an assumed right atrial pressure of 10 mmHg, the estimated right ventricular systolic pressure is [id number] mmHg. Left Atrium: Left atrial size was moderately dilated. No left atrial/left atrial appendage thrombus was detected. Right Atrium: Right atrial size was moderately dilated. Pericardium: There is no evidence of pericardial effusion. Mitral Valve: The mitral valve is normal in structure. Normal mobility of the mitral valve leaflets. Mild mitral valve regurgitation. No evidence of mitral valve stenosis. Tricuspid Valve: The tricuspid valve is normal in structure. Tricuspid valve regurgitation is mild . No evidence of tricuspid stenosis. Aortic Valve: The aortic valve is normal in structure. Aortic valve regurgitation is not visualized. No aortic stenosis is present. Pulmonic Valve: The pulmonic valve was normal in structure. Pulmonic valve regurgitation is not visualized.  No evidence of pulmonic stenosis. Aorta: The aortic root is normal in size and structure. Venous: The inferior vena cava is normal in size with greater than 50% respiratory variability, suggesting right atrial pressure of 3 mmHg. IAS/Shunts: No atrial level shunt detected by color flow Doppler. Agitated saline contrast was given intravenously to evaluate for intracardiac shunting. Agitated saline contrast bubble study was positive with shunting observed within 3-6 cardiac cycles suggestive of interatrial shunt. A large patent foramen ovale is detected with bidirectional shunting across atrial septum. There is a atrial septal defect with predominantly left to right shunting across the atrial septum.  TRICUSPID VALVE TR Peak grad:   23.2 mmHg TR Vmax:        241.00 cm/s Ena Dawley MD Electronically signed by Ena Dawley MD Signature Date/Time: 01/03/2020/2:16:17 PM    Final    VAS [place] LOWER EXTREMITY VENOUS (DVT)  Result Date: 12/31/2019  Lower Venous DVTStudy Indications: Pulmonary embolism, and recent left hamtring injury.  Comparison Study: No prior study on file Performing Technologist: Sharion Dove RVS  Examination Guidelines: A complete evaluation includes B-mode imaging, spectral Doppler, color Doppler, and power Doppler as needed of all accessible portions of each vessel. Bilateral testing is considered an integral part of a complete examination. Limited examinations for reoccurring indications may be performed as noted. The reflux portion of the exam is performed with the patient in reverse Trendelenburg.  +---------+---------------+---------+-----------+----------+--------------+ RIGHT    CompressibilityPhasicitySpontaneityPropertiesThrombus Aging +---------+---------------+---------+-----------+----------+--------------+ CFV      Full           No  No                                  +---------+---------------+---------+-----------+----------+--------------+ SFJ      Full                                                         +---------+---------------+---------+-----------+----------+--------------+ FV Prox  Full                                                        +---------+---------------+---------+-----------+----------+--------------+ FV Mid   Full                                                        +---------+---------------+---------+-----------+----------+--------------+ FV DistalFull                                                        +---------+---------------+---------+-----------+----------+--------------+ PFV      Full                                                        +---------+---------------+---------+-----------+----------+--------------+ POP      Full           No       No                   sluggish flow  +---------+---------------+---------+-----------+----------+--------------+ PTV      Full                                                        +---------+---------------+---------+-----------+----------+--------------+ PERO     Full                                                        +---------+---------------+---------+-----------+----------+--------------+   +---------+---------------+---------+-----------+----------+-----------------+ LEFT     CompressibilityPhasicitySpontaneityPropertiesThrombus Aging    +---------+---------------+---------+-----------+----------+-----------------+ CFV      Partial        Yes      Yes                  Age Indeterminate +---------+---------------+---------+-----------+----------+-----------------+ SFJ      Full  Age Indeterminate +---------+---------------+---------+-----------+----------+-----------------+ FV Prox  Partial                                      Age Indeterminate +---------+---------------+---------+-----------+----------+-----------------+ FV Mid   Partial                                       Age Indeterminate +---------+---------------+---------+-----------+----------+-----------------+ FV DistalPartial                                      Age Indeterminate +---------+---------------+---------+-----------+----------+-----------------+ PFV      Full                                                           +---------+---------------+---------+-----------+----------+-----------------+ POP      Partial        Yes      No                   Age Indeterminate +---------+---------------+---------+-----------+----------+-----------------+ PTV      Full                                                           +---------+---------------+---------+-----------+----------+-----------------+ PERO     Full                                                           +---------+---------------+---------+-----------+----------+-----------------+     Summary: RIGHT: - There is no evidence of deep vein thrombosis in the lower extremity.  sluggish flow noted in popliteal vein  LEFT: - Findings consistent with age indeterminate deep vein thrombosis involving the left common femoral vein, SF junction, left femoral vein, and left popliteal vein.  *See table(s) above for measurements and observations. Electronically signed by Monica Martinez MD on 12/31/2019 at 4:40:35 PM.    Final    ABORTED INVASIVE LAB PROCEDURE  Result Date: 01/01/2020 This case was aborted.    Subjective: Feeling well, no further bleeding  Discharge Exam: Vitals:   01/11/20 0953 01/11/20 1643  BP:  (!) 116/96  Pulse: (!) 114 63  Resp:  19  Temp:  98 F (36.7 C)  SpO2:  99%     General: Pt is alert, awake, not in acute distress Cardiovascular: RRR, S1/S2 +, no rubs, no gallops Respiratory: CTA bilaterally, no wheezing, no rhonchi Abdominal: Soft, NT, ND, bowel sounds + Extremities: no edema, no cyanosis    The results of significant diagnostics from this hospitalization  (including imaging, microbiology, ancillary and laboratory) are listed below for reference.     Microbiology: No results found for this or any previous visit (from the past 240 hour(s)).   Labs: BNP (last 3 results) Recent Labs  12/29/19 1633 01/06/20 0453 01/09/20 0350  BNP 1,086.8* 1,710.4* 123456*   Basic Metabolic Panel: Recent Labs  Lab 01/05/20 0256 01/07/20 0352 01/07/20 0919 01/08/20 0335 01/09/20 0350 01/09/20 [id number] 01/10/20 0308 01/11/20 0317  NA   < >  --  135 137  --  133* 135 136  K   < >  --  3.9 3.8  --  4.6 5.0 5.0  CL   < >  --  103 100  --  103 104 103  CO2   < >  --  23 27  --  22 19* 23  GLUCOSE   < >  --  139* 106*  --  133* 110* 113*  BUN   < >  --  17 22*  --  27* 30* 29*  CREATININE   < >  --  1.49* 1.68*  --  1.50* 1.47* 1.77*  CALCIUM   < >  --  7.8* 8.3*  --  8.1* 8.2* 8.4*  MG  --  1.7  --  1.6* 2.2  --  1.9 1.7  PHOS   < >  --  2.7 3.5  --  2.7 3.3 3.3   < > = values in this interval not displayed.   Liver Function Tests: Recent Labs  Lab 01/07/20 0919 01/08/20 0335 01/09/20 0819 01/10/20 0308 01/11/20 0317  ALBUMIN 1.5* 1.6* 1.7* 1.7* 1.8*   No results for input(s): LIPASE, AMYLASE in the last 168 hours. No results for input(s): AMMONIA in the last 168 hours. CBC: Recent Labs  Lab 01/05/20 0256 01/06/20 0453 01/07/20 0352 01/08/20 0335 01/11/20 0810  WBC 13.9* 14.8* 13.6* 12.2* 13.1*  HGB 10.9* 10.9* 11.6* 11.8* 12.1*  HCT 31.1* 31.1* 33.6* 34.2* 35.5*  MCV 79.5* 80.4 79.8* 80.3 80.9  PLT 246 287 339 395 399   Cardiac Enzymes: No results for input(s): CKTOTAL, CKMB, CKMBINDEX, TROPONINI in the last 168 hours. BNP: Invalid input(s): POCBNP CBG: Recent Labs  Lab 01/04/20 2110  GLUCAP 160*   D-Dimer No results for input(s): DDIMER in the last 72 hours. Hgb A1c No results for input(s): HGBA1C in the last 72 hours. Lipid Profile No results for input(s): CHOL, HDL, LDLCALC, TRIG, CHOLHDL, LDLDIRECT in the last 72  hours. Thyroid function studies No results for input(s): TSH, T4TOTAL, T3FREE, THYROIDAB in the last 72 hours.  Invalid input(s): FREET3 Anemia work up No results for input(s): VITAMINB12, FOLATE, FERRITIN, TIBC, IRON, RETICCTPCT in the last 72 hours. Urinalysis    Component Value Date/Time   COLORURINE YELLOW 12/30/2019 0430   APPEARANCEUR CLEAR 12/30/2019 0430   LABSPEC 1.036 (H) 12/30/2019 0430   PHURINE 5.0 12/30/2019 0430   GLUCOSEU NEGATIVE 12/30/2019 0430   HGBUR SMALL (A) 12/30/2019 0430   BILIRUBINUR NEGATIVE 12/30/2019 0430   BILIRUBINUR neg 01/12/2013 1714   KETONESUR NEGATIVE 12/30/2019 0430   PROTEINUR 30 (A) 12/30/2019 0430   UROBILINOGEN 0.2 01/12/2013 1714   NITRITE NEGATIVE 12/30/2019 0430   LEUKOCYTESUR NEGATIVE 12/30/2019 0430   Sepsis Labs Invalid input(s): PROCALCITONIN,  WBC,  LACTICIDVEN Microbiology No results found for this or any previous visit (from the past 240 hour(s)).   Time coordinating discharge: 40 minutes  SIGNED:   Elmarie Shiley, MD  [facility]

## 2020-01-15 ENCOUNTER — Telehealth: Payer: Self-pay | Admitting: Cardiovascular Disease

## 2020-01-15 NOTE — Telephone Encounter (Signed)
I spoke to the patient who called because he was inquiring about an appointment with Dr Burt Knack discussed at D/C from hospital to discuss PFO.    I told them that I would forward to Dr Antionette Char nurse to call him and further discuss.

## 2020-01-15 NOTE — Telephone Encounter (Signed)
Patient's wife wants to know if the patient still needs to schedule an appt with Dr. Burt Knack. Per discharge papers he was to follow up with Dr. Burt Knack and Dr. Vivia Ewing. He had an appt with Dr. Burt Knack on 01/11/20 but he was still in the hospital at 8am so it was cancelled. He has a f/u appt with Fabian Sharp on 01/29/20. Please advise.

## 2020-01-16 NOTE — Telephone Encounter (Signed)
Rescheduled patient to 5/21 with Dr. Burt Knack. He was grateful for call and agrees with treatment plan.

## 2020-01-24 ENCOUNTER — Telehealth: Payer: Self-pay | Admitting: Cardiovascular Disease

## 2020-01-24 NOTE — Telephone Encounter (Signed)
FMLA/disability form received from [employer]. Placed in box for Dr. Burt Knack to review. 01/24/20 vlm

## 2020-01-25 ENCOUNTER — Other Ambulatory Visit: Payer: Self-pay

## 2020-01-25 ENCOUNTER — Encounter: Payer: Self-pay | Admitting: Cardiovascular Disease

## 2020-01-25 ENCOUNTER — Ambulatory Visit (INDEPENDENT_AMBULATORY_CARE_PROVIDER_SITE_OTHER): Payer: Self-pay | Admitting: Cardiovascular Disease

## 2020-01-25 VITALS — BP 120/82 | HR 67 | Ht 73.0 in | Wt 178.4 lb

## 2020-01-25 DIAGNOSIS — I4892 Unspecified atrial flutter: Secondary | ICD-10-CM

## 2020-01-25 DIAGNOSIS — I82402 Acute embolism and thrombosis of unspecified deep veins of left lower extremity: Secondary | ICD-10-CM

## 2020-01-25 DIAGNOSIS — Q2112 Patent foramen ovale: Secondary | ICD-10-CM

## 2020-01-25 DIAGNOSIS — Q211 Atrial septal defect: Secondary | ICD-10-CM

## 2020-01-25 NOTE — Progress Notes (Signed)
Cardiology Office Note:    Date:  01/27/2020   ID:  Cody Joyce, DOB August 04, 1961, MRN [id number]  PCP:  [facility]  Cardiologist:  Sanda Klein, MD  Electrophysiologist:  None   Referring MD: No ref. provider found   Chief Complaint  Patient presents with  . PFO Consult    History of Present Illness:    Cody Joyce is a 59 y.o. male presenting for evaluation of PFO, referred by Dr Sallyanne Kuster. The patient has a history of alcohol and tobacco abuse, but no other known medical problems. He has been previously active. He was admitted 12/29/2019 with hypoxia and shock, found to have submassive PE. He was noted to have RV strain and evidence of RV mural thrombus, ultimately treated with thrombectomy. He was found to have lower extremity DVT involving the left popliteal and common femoral veins. TEE showed moderate global LV dysfunction, signs of RV volume and pressure overload, and a PFO with right to left shunting by color doppler and agitated saline. The patient's hospital course was complicated by the presence of atrial flutter with RVR, requiring amiodarone therapy. He underwent cardiac catheterization demonstrating no significant CAD. The patient was discharged from the hospital on apixaban for anticoagulation, in controlled atrial flutter.  He presents today with his wife. He remains on O2 but doesn't need it very often. He is progressing well and notes improvement in his breathing. No chest pain or pressure. No stroke or TIA symptoms. No leg swelling. The patient has quit drinking alcohol and has quit smoking. He is very motivated to live and take care of himself.   Past Medical History:  Diagnosis Date  . Acute deep vein thrombosis (DVT) of left lower extremity ([place]) 01/03/2020  . Acute pulmonary embolism with acute cor pulmonale ([place]) 12/30/2019  . Acute systolic CHF (congestive heart failure) ([place]) 01/03/2020  . Alcohol dependence ([place]) 07/25/2018  . Aortic  atherosclerosis ([place]) 08/09/2018  . COPD (chronic obstructive pulmonary disease) ([place]) 07/25/2018  . Emphysema of lung ([place])   . 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 We will we ultrasound at followup the   . Mediastinal adenopathy 07/25/2018  . Multiple lung nodules on CT 07/25/2018  . Non-STEMI (non-ST elevated myocardial infarction) ([place]) 01/03/2020  . Osteoarthritis of left knee 05/30/2013   Tricompartmental disease Free-floating mass mostly in the superior lateral patellar compartment.   [name] PFO (patent foramen ovale) 01/03/2020  . Pulmonary embolism ([place]) 12/29/2019  . Renal infarct [facility])     Past Surgical History:  Procedure Laterality Date  . BUBBLE STUDY  01/03/2020   Procedure: BUBBLE STUDY;  Surgeon: [name], MD;  Location: [facility];  Service: Cardiovascular;;  . IR ANGIOGRAM PULMONARY BILATERAL SELECTIVE  12/30/2019  . IR ANGIOGRAM SELECTIVE EACH ADDITIONAL VESSEL  12/30/2019  . IR ANGIOGRAM SELECTIVE EACH ADDITIONAL VESSEL  12/30/2019  . IR THROMBECT PRIM MECH ADD (INCLU) MOD SED  12/30/2019  . IR THROMBECT PRIM MECH INIT (INCLU) MOD SED  12/30/2019  . IR US GUIDE VASC ACCESS RIGHT  12/30/2019  . RIGHT/LEFT HEART CATH AND CORONARY ANGIOGRAPHY N/A 01/02/2020   Procedure: RIGHT/LEFT HEART CATH AND CORONARY ANGIOGRAPHY;  Surgeon: [name], MD;  Location: [place] [facility];  Service: Cardiovascular;  Laterality: N/A;  . TEE WITHOUT CARDIOVERSION N/A 01/03/2020   Procedure: TRANSESOPHAGEAL ECHOCARDIOGRAM (TEE)   DEFINITY ;  Surgeon: [name], MD;  Location: [place];  Service: Cardiovascular;  Laterality: N/A;  Current Medications: Current Meds  Medication Sig  . acetaminophen (TYLENOL) 500 MG tablet Take 1,000 mg by mouth every 6 (six) hours as needed for headache (pain).  [name] albuterol (VENTOLIN HFA) 108 (90 Base) MCG/ACT inhaler Inhale 2 puffs into the lungs 4 (four) times daily.  [name] amiodarone (PACERONE) 200 MG tablet Take 2  tablets (400 mg total) by mouth daily for 7 days, THEN 1 tablet (200 mg total) daily.  [name] apixaban (ELIQUIS) 5 MG TABS tablet Take 1 tablet (5 mg total) by mouth 2 (two) times daily. **start this after completing Eliquis Starter Pack**  . APIXABAN (ELIQUIS) VTE STARTER PACK (10MG  AND 5MG ) Take as directed on package: start with two-5mg  tablets twice daily for 7 days. On day 8, switch to one-5mg  tablet twice daily.  . diclofenac Sodium (VOLTAREN) 1 % GEL Apply 1 application topically 3 (three) times daily as needed (knee pain).  . furosemide (LASIX) 40 MG tablet Take 0.5 tablets (20 mg total) by mouth daily.  [name] LIDOCAINE EX Apply 1 application topically daily as needed (pain).  . metoprolol succinate (TOPROL-XL) 25 MG 24 hr tablet Take 3 tablets (75 mg total) by mouth daily.  . Multiple Vitamin (MULTIVITAMIN WITH MINERALS) TABS tablet Take 1 tablet by mouth daily.  [name] omeprazole (PRILOSEC) 20 MG capsule Take 20 mg by mouth 2 (two) times daily before a meal.  . potassium chloride (KLOR-CON) 10 MEQ tablet Take 10 mEq by mouth daily.     Allergies:   Patient has no known allergies.   Social History   Socioeconomic History  . Marital status: Married    Spouse name: Not on file  . Number of children: Not on file  . Years of education: Not on file  . Highest education level: Not on file  Occupational History  . Not on file  Tobacco Use  . Smoking status: Light Tobacco Smoker  . Smokeless tobacco: Never Used  Substance and Sexual Activity  . Alcohol use: Yes    Alcohol/week: 6.0 standard drinks    Types: 2 Glasses of wine, 4 Cans of beer per week  . Drug use: No  . Sexual activity: Yes  Other Topics Concern  . Not on file  Social History Narrative   Marital status:  Married x 3 years; not happily married.      Children:  None      Lives: alone.      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      Tobacco: smoke cigars 2 per day.  Quit cigarettes in 2001; smoked x 15  years.      Alcohol:  Weekends 4 beers per week; 2 drinks per week.      Drugs:  None      Exercising:  Three days per week; active job.        Seatbelt:  100%      Guns:  None      Sexual activity: condoms; Gonorrhea in high school.  One partner in past year.  Last STD screening 2013.   Social Determinants of Health   Financial Resource Strain:   . Difficulty of Paying Living Expenses:   Food Insecurity:   . Worried About Charity fundraiser in the Last Year:   . Arboriculturist in the Last Year:   Transportation Needs:   . Film/video editor (Medical):   [name] Lack of Transportation (Non-Medical):   Physical Activity:   . Days of Exercise  per Week:   . Minutes of Exercise per Session:   Stress:   . Feeling of Stress :   Social Connections:   . Frequency of Communication with Friends and Family:   . Frequency of Social Gatherings with Friends and Family:   . Attends Religious Services:   . Active Member of Clubs or Organizations:   . Attends Archivist Meetings:   [name] Marital Status:      Family History: The patient's family history includes Glaucoma in his father and mother; Heart disease in his father.  ROS:   Please see the history of present illness.    All other systems reviewed and are negative.  EKGs/Labs/Other Studies Reviewed:    The following studies were reviewed today: Echo: FINDINGS  Right Ventricle: The right ventricular size is moderately enlarged. Right  ventricular systolic function is mildly reduced.   IAS/Shunts: There is left bowing of the interatrial septum, suggestive of  elevated right atrial pressure. Agitated saline contrast was given  intravenously to evaluate for intracardiac shunting. Agitated saline  contrast bubble study was positive with  shunting observed within 3-6 cardiac cycles suggestive of interatrial  shunt.   TEE: IMPRESSIONS    1. There is a smoke in the left ventricular apex with no evidence for a  thrombus  on Definity echo contrast images.. Left ventricular ejection  fraction, by estimation, is 30 to 35%. The left ventricle has moderately  decreased function. The left ventricle  demonstrates global hypokinesis. There is the interventricular septum is  flattened in systole and diastole, consistent with right ventricular  pressure and volume overload.  2. Right ventricular systolic function is severely reduced. The right  ventricular size is severely enlarged. There is normal pulmonary artery  systolic pressure.  3. Left atrial size was moderately dilated. No left atrial/left atrial  appendage thrombus was detected.  4. Right atrial size was moderately dilated.  5. The mitral valve is normal in structure. Mild mitral valve  regurgitation. No evidence of mitral stenosis.  6. The aortic valve is normal in structure. Aortic valve regurgitation is  not visualized. No aortic stenosis is present.  7. The inferior vena cava is normal in size with greater than 50%  respiratory variability, suggesting right atrial pressure of 3 mmHg.  8. Agitated saline contrast bubble study was significantly positive with  shunting observed within the first cardiac cycles suggestive of  interatrial shunt. There is a large patent foramen ovale with  bidirectional shunting across atrial septum.   Conclusion(s)/Recommendation(s): Findings are concerning for an  interatrial shunt as detailed above.   Cardiac Cath 01-02-2020: Conclusion   Normal coronary arteries.  No evidence of left-to-right shunting by oximetry.  Mild elevation in pulmonary artery pressures.  Technically unable to obtain pulmonary capillary wedge pressure.  Normal left ventricular end-diastolic pressure.  RECOMMENDATIONS:   Resume anticoagulation therapy for pulmonary embolism as soon as possible.    EKG:  EKG is ordered today.  The ekg ordered today demonstrates NSR, prolonged QT  Recent Labs: 01/02/2020: TSH  4.168 01/04/2020: ALT 64 01/09/2020: B Natriuretic Peptide 1,504.1 01/11/2020: BUN 29; Creatinine, Ser 1.77; Hemoglobin 12.1; Magnesium 1.7; Platelets 399; Potassium 5.0; Sodium 136  Recent Lipid Panel No results found for: CHOL, TRIG, HDL, CHOLHDL, VLDL, LDLCALC, LDLDIRECT  Physical Exam:    VS:  BP 120/82   Pulse 67   Ht 6\' 1"  (1.854 m)   Wt 178 lb 6.4 oz (80.9 kg)   SpO2 99%   BMI  23.54 kg/m     Wt Readings from Last 3 Encounters:  01/25/20 178 lb 6.4 oz (80.9 kg)  01/10/20 177 lb 0.5 oz (80.3 kg)  08/09/18 180 lb (81.6 kg)     GEN:  Well nourished, well developed in no acute distress HEENT: Normal NECK: No JVD; No carotid bruits LYMPHATICS: No lymphadenopathy CARDIAC: RRR, no murmurs, rubs, gallops RESPIRATORY:  Clear to auscultation without rales, wheezing or rhonchi  ABDOMEN: Soft, non-tender, non-distended MUSCULOSKELETAL:  No edema; No deformity  SKIN: Warm and dry NEUROLOGIC:  Alert and oriented x 3 PSYCHIATRIC:  Normal affect   ASSESSMENT:    1. PFO (patent foramen ovale)   2. Atrial flutter, unspecified type ([place])   3. Acute deep vein thrombosis (DVT) of left lower extremity, unspecified vein ([facility])    PLAN:    In order of problems listed above:  1. Hospital records and imaging studies reviewed. The patient has a PFO with R--->L flow demonstrated by color doppler and agitated saline. This occurs during hemodynamically significant pulmonary embolus with associated RV dysfunction. He did have clinical sequelae of paradoxical embolus with multiple left renal infarcts seen on CT imaging.  Plan to repeat an echocardiogram after about 8 weeks of anticoagulation to reassess RV function, PA pressure estimate, and evaluate whether there is any color flow across the inter-atrial septum. Anatomically the PFO is amenable to transcatheter closure if needed in the future. Decision for PFO closure will be impacted by determination on long-term anticoagulation. 2. He is back in  sinus rhythm. Suspect this was also a result of acutely elevated RV/RA pressures with increased wall tension. He continues on amiodarone and apixaban. Will arrange follow-up with Dr Sallyanne Kuster after his echo study. 3. Arrange repeat venous dopplers to evaluate for clot resolution. Will need to determine appropriate duration of anticoagulation (anticipate minimum 6 months after submassive PE).   Overall he appears to be progressing well. He has stopped drinking and smoking and I suspect this will make a substantial positive impact on his health. LV function will be reassessed at the time of 2D echo. Otherwise continue current Rx for now.   Medication Adjustments/Labs and Tests Ordered: Current medicines are reviewed at length with the patient today.  Concerns regarding medicines are outlined above.  Orders Placed This Encounter  Procedures  . EKG 12-Lead  . ECHOCARDIOGRAM COMPLETE  . VAS [place] LOWER EXTREMITY VENOUS (DVT)   No orders of the defined types were placed in this encounter.   Patient Instructions  Medication Instructions:  Your provider recommends that you continue on your current medications as directed. Please refer to the Current Medication list given to you today.  *If you need a refill on your cardiac medications before your next appointment, please call your pharmacy*  Testing/Procedures: Your provider has requested that you have an echocardiogram. Echocardiography is a painless test that uses sound waves to create images of your heart. It provides your doctor with information about the size and shape of your heart and how well your heart's chambers and valves are working. This procedure takes approximately one hour. There are no restrictions for this procedure. Please keep your appointment in July.     Dr. Burt Knack recommends you have LOWER EXTREMITY DUPLEX the week before your appointment with Dr. Sallyanne Kuster.  Follow-Up: Keep your appointment as scheduled with Dr. Sallyanne Kuster on July  15.    Signed, [name], MD  01/27/2020 1:15 PM    [facility]

## 2020-01-25 NOTE — Patient Instructions (Addendum)
Medication Instructions:  Your provider recommends that you continue on your current medications as directed. Please refer to the Current Medication list given to you today.  *If you need a refill on your cardiac medications before your next appointment, please call your pharmacy*  Testing/Procedures: Your provider has requested that you have an echocardiogram. Echocardiography is a painless test that uses sound waves to create images of your heart. It provides your doctor with information about the size and shape of your heart and how well your heart's chambers and valves are working. This procedure takes approximately one hour. There are no restrictions for this procedure. Please keep your appointment in July.     Dr. Burt Knack recommends you have LOWER EXTREMITY DUPLEX the week before your appointment with Dr. Sallyanne Kuster.  Follow-Up: Keep your appointment as scheduled with Dr. Sallyanne Kuster on July 15.

## 2020-01-27 ENCOUNTER — Encounter: Payer: Self-pay | Admitting: Cardiovascular Disease

## 2020-01-29 ENCOUNTER — Ambulatory Visit: Payer: No Typology Code available for payment source | Admitting: Physician Assistant

## 2020-02-08 NOTE — Progress Notes (Signed)
Thanks, Mike.

## 2020-03-12 ENCOUNTER — Other Ambulatory Visit: Payer: Self-pay

## 2020-03-12 ENCOUNTER — Ambulatory Visit (HOSPITAL_COMMUNITY)
Admission: RE | Admit: 2020-03-12 | Discharge: 2020-03-12 | Disposition: A | Discharge status: Home / Self Care | Payer: No Typology Code available for payment source | Source: Ambulatory Visit | Attending: Cardiology | Admitting: Cardiology

## 2020-03-12 ENCOUNTER — Ambulatory Visit (HOSPITAL_BASED_OUTPATIENT_CLINIC_OR_DEPARTMENT_OTHER): Payer: No Typology Code available for payment source

## 2020-03-12 DIAGNOSIS — I82402 Acute embolism and thrombosis of unspecified deep veins of left lower extremity: Secondary | ICD-10-CM | POA: Diagnosis not present

## 2020-03-12 DIAGNOSIS — I4892 Unspecified atrial flutter: Secondary | ICD-10-CM | POA: Insufficient documentation

## 2020-03-20 ENCOUNTER — Encounter: Payer: Self-pay | Admitting: Cardiovascular Disease

## 2020-03-20 ENCOUNTER — Ambulatory Visit (INDEPENDENT_AMBULATORY_CARE_PROVIDER_SITE_OTHER): Payer: No Typology Code available for payment source | Admitting: Cardiovascular Disease

## 2020-03-20 ENCOUNTER — Other Ambulatory Visit: Payer: Self-pay

## 2020-03-20 VITALS — BP 111/62 | HR 53 | Resp 95 | Ht 72.0 in | Wt 188.6 lb

## 2020-03-20 DIAGNOSIS — I5042 Chronic combined systolic (congestive) and diastolic (congestive) heart failure: Secondary | ICD-10-CM

## 2020-03-20 DIAGNOSIS — N280 Ischemia and infarction of kidney: Secondary | ICD-10-CM

## 2020-03-20 DIAGNOSIS — I82512 Chronic embolism and thrombosis of left femoral vein: Secondary | ICD-10-CM

## 2020-03-20 DIAGNOSIS — I483 Typical atrial flutter: Secondary | ICD-10-CM | POA: Diagnosis not present

## 2020-03-20 DIAGNOSIS — Q2112 Patent foramen ovale: Secondary | ICD-10-CM

## 2020-03-20 DIAGNOSIS — I2609 Other pulmonary embolism with acute cor pulmonale: Secondary | ICD-10-CM

## 2020-03-20 DIAGNOSIS — Q211 Atrial septal defect: Secondary | ICD-10-CM

## 2020-03-20 NOTE — Progress Notes (Addendum)
Cardiology Office Note:    Date:  03/21/2020   ID:  Cody Joyce, DOB [birth year], MRN [id number]  PCP:  [facility]  [facility] Cardiologist:  Sanda Klein, MD  [facility] Electrophysiologist:  None   Referring MD: [facility]   CC: hospital follow up  History of Present Illness:    Cody Joyce is a 59 y.o. 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   L/R cath on 01/02/2020 showed normal coronary arteries. TEE during hospitalization on 01/03/2020 showed EF of 30-35% with moderated reduced left ventricular function with global hypokinesis. The right ventricle function was severely reduced with severe enlargement. Biatrial enlargement noted. AV and MV without stenosis or regurg. Agitated bubble study with findings of large PFO with bidirectional shunting.   Repeat outpatient TTE on 03/12/2020 showed improvement in EF to 40-45% with continued left ventricular dysfunction and global hypokinesis. Right ventricular systolic function has improved to normal. MV appears normal. Left venous doppler obtained on the same day shows chronic left popliteal and common femoral DVTs.   Follow up with Dr. Burt Knack on 01/25/2020. No immediate plan for PFO closure as patient is on anti-coagulation now.   Today, Mr. Cody Joyce reports he is doing much better. He continues to have occasional SOB with breathing not quite back to baseline, however states it is significantly improved since hospitalization. He reports he can run up stairs and would be able to load a truck with chest pain or SOB. He denies any orthopnea, palpitations, leg swelling, dizziness. He continues to take his Amiodarone, Eliquis, Metoprolol, Lasix and Potassium  supplementation daily without difficulty.   Past Medical History:  Diagnosis Date  . Acute deep vein thrombosis (DVT) of left lower extremity ([place]) 01/03/2020  . Acute pulmonary embolism with acute cor pulmonale (Cody Joyce) 12/30/2019  . Acute systolic CHF (congestive heart failure) (Cody Joyce) 01/03/2020  . Alcohol dependence ([place]) 07/25/2018  . Aortic atherosclerosis ([place]) 08/09/2018  . COPD (chronic obstructive pulmonary disease) ([place]) 07/25/2018  . Emphysema of lung ([place])   . 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 We will we ultrasound at followup the   . Mediastinal adenopathy 07/25/2018  . Multiple lung nodules on CT 07/25/2018  . Non-STEMI (non-ST elevated myocardial infarction) (Cody Joyce) 01/03/2020  . Osteoarthritis of left knee 05/30/2013   Tricompartmental disease Free-floating mass mostly in the superior lateral patellar compartment.   [name] PFO (patent foramen ovale) 01/03/2020  . Pulmonary embolism (Cody Joyce) 12/29/2019  . Renal infarct [facility])     Past Surgical History:  Procedure Laterality Date  . BUBBLE STUDY  01/03/2020   Procedure: BUBBLE STUDY;  Surgeon: [name], MD;  Location: [place];  Service: Cardiovascular;;  . IR ANGIOGRAM PULMONARY BILATERAL SELECTIVE  12/30/2019  . IR ANGIOGRAM SELECTIVE EACH ADDITIONAL VESSEL  12/30/2019  . IR ANGIOGRAM SELECTIVE EACH ADDITIONAL VESSEL  12/30/2019  . IR THROMBECT PRIM MECH ADD (INCLU) MOD SED  12/30/2019  . IR THROMBECT PRIM MECH INIT (INCLU) MOD SED  12/30/2019  . IR US GUIDE VASC ACCESS RIGHT  12/30/2019  . RIGHT/LEFT HEART CATH AND CORONARY ANGIOGRAPHY N/A 01/02/2020   Procedure: RIGHT/LEFT HEART CATH AND CORONARY ANGIOGRAPHY;  Surgeon: [name], MD;  Location: [facility] INVASIVE CV  LAB;  Service: Cardiovascular;  Laterality: N/A;  . TEE WITHOUT CARDIOVERSION N/A 01/03/2020   Procedure: TRANSESOPHAGEAL ECHOCARDIOGRAM (TEE)   DEFINITY ;  Surgeon: [name], MD;  Location[facility] ENDOSCOPY;  Service: Cardiovascular;   Laterality: N/A;    Current Medications: Current Meds  Medication Sig  . acetaminophen (TYLENOL) 500 MG tablet Take 1,000 mg by mouth every 6 (six) hours as needed for headache (pain).  [name] albuterol (VENTOLIN HFA) 108 (90 Base) MCG/ACT inhaler Inhale 2 puffs into the lungs 4 (four) times daily.  [name] apixaban (ELIQUIS) 5 MG TABS tablet Take 1 tablet (5 mg total) by mouth 2 (two) times daily. **start this after completing Eliquis Starter Pack**  . APIXABAN (ELIQUIS) VTE STARTER PACK (10MG  AND 5MG ) Take as directed on package: start with two-5mg  tablets twice daily for 7 days. On day 8, switch to one-5mg  tablet twice daily.  . diclofenac Sodium (VOLTAREN) 1 % GEL Apply 1 application topically 3 (three) times daily as needed (knee pain).  [name] LIDOCAINE EX Apply 1 application topically daily as needed (pain).  . metoprolol succinate (TOPROL-XL) 25 MG 24 hr tablet Take 3 tablets (75 mg total) by mouth daily.  . Multiple Vitamin (MULTIVITAMIN WITH MINERALS) TABS tablet Take 1 tablet by mouth daily.  [name] omeprazole (PRILOSEC) 20 MG capsule Take 20 mg by mouth 2 (two) times daily before a meal.  . potassium chloride (KLOR-CON) 10 MEQ tablet Take 10 mEq by mouth daily.  . [DISCONTINUED] amiodarone (PACERONE) 200 MG tablet Take 2 tablets (400 mg total) by mouth daily for 7 days, THEN 1 tablet (200 mg total) daily.  . [DISCONTINUED] furosemide (LASIX) 40 MG tablet Take 0.5 tablets (20 mg total) by mouth daily.     Allergies:   Patient has no known allergies.   Social History   Socioeconomic History  . Marital status: Married    Spouse name: Not on file  . Number of children: Not on file  . Years of education: Not on file  . Highest education level: Not on file  Occupational History  . Not on file  Tobacco Use  . Smoking status: Light Tobacco Smoker  . Smokeless tobacco: Never Used  Vaping Use  . Vaping Use: Never used  Substance and Sexual Activity  . Alcohol use: Yes    Alcohol/week: 6.0  standard drinks    Types: 2 Glasses of wine, 4 Cans of beer per week  . Drug use: No  . Sexual activity: Yes  Other Topics Concern  . Not on file  Social History Narrative   Marital status:  Married x 3 years; not happily married.      Children:  None      Lives: alone.      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      Tobacco: smoke cigars 2 per day.  Quit cigarettes in 2001; smoked x 15 years.      Alcohol:  Weekends 4 beers per week; 2 drinks per week.      Drugs:  None      Exercising:  Three days per week; active job.        Seatbelt:  100%      Guns:  None      Sexual activity: condoms; Gonorrhea in high school.  One partner in past year.  Last STD screening 2013.   Social Determinants of Health   Financial Resource Strain:   . Difficulty of Paying Living Expenses:  Food Insecurity:   . Worried About Charity fundraiser in the Last Year:   . Arboriculturist in the Last Year:   Transportation Needs:   . Film/video editor (Medical):   [name] Lack of Transportation (Non-Medical):   Physical Activity:   . Days of Exercise per Week:   . Minutes of Exercise per Session:   Stress:   . Feeling of Stress :   Social Connections:   . Frequency of Communication with Friends and Family:   . Frequency of Social Gatherings with Friends and Family:   . Attends Religious Services:   . Active Member of Clubs or Organizations:   . Attends Archivist Meetings:   [name] Marital Status:     Family History: The patient's *family history includes Glaucoma in his father and mother; Heart disease in his father.  ROS:   Please see the history of present illness.    All other systems reviewed and are negative.  EKGs/Labs/Other Studies Reviewed:    The following studies were reviewed today:  TEE (01/03/2020)  1. There is a smoke in the left ventricular apex with no evidence for a  thrombus on Definity echo contrast images.. Left ventricular ejection  fraction,  by estimation, is 30 to 35%. The left ventricle has moderately decreased function. The left ventricle demonstrates global hypokinesis. There is the interventricular septum is flattened in systole and diastole, consistent with right ventricular pressure and volume overload.  2. Right ventricular systolic function is severely reduced. The right  ventricular size is severely enlarged. There is normal pulmonary artery systolic pressure.  3. Left atrial size was moderately dilated. No left atrial/left atrial appendage thrombus was detected.  4. Right atrial size was moderately dilated.  5. The mitral valve is normal in structure. Mild mitral valve regurgitation. No evidence of mitral stenosis.  6. The aortic valve is normal in structure. Aortic valve regurgitation is not visualized. No aortic stenosis is present.  7. The inferior vena cava is normal in size with greater than 50% respiratory variability, suggesting right atrial pressure of 3 mmHg.  8. Agitated saline contrast bubble study was significantly positive with shunting observed within the first cardiac cycles suggestive of  interatrial shunt. There is a large patent foramen ovale with bidirectional shunting across atrial septum.   Conclusion(s)/Recommendation(s): Findings are concerning for an  interatrial shunt as detailed above.   TTE (03/12/2020)  1. Left ventricular ejection fraction, by estimation, is 40 to 45%. The  left ventricle has mildly decreased function. The left ventricle demonstrates global hypokinesis. Left ventricular diastolic parameters are indeterminate. The average left ventricular global longitudinal strain is -14.0 %.  2. Right ventricular systolic function is normal. The right ventricular size is normal. There is normal pulmonary artery systolic pressure.  3. Left atrial size was mildly dilated.  4. The mitral valve is normal in structure. Trivial mitral valve regurgitation.  5. The aortic valve is tricuspid.  Aortic valve regurgitation is not visualized. No aortic stenosis is present.  6. The inferior vena cava is normal in size with greater than 50% respiratory variability, suggesting right atrial pressure of 3 mmHg.   Conclusion(s)/Recommendation(s): EF reduced, though visually and by  measurement appears slightly improved compared to prior. RV appears  normal, normal RVSP. Mitral valve appears normal, previously noted as  myxomatous. Strain mildly and diffusely abnormal  throughout LV.   EKG:  EKG is not ordered today.  Recent Labs: 01/02/2020: TSH 4.168 01/04/2020: ALT 64  01/09/2020: B Natriuretic Peptide 1,504.1 01/11/2020: BUN 29; Creatinine, Ser 1.77; Hemoglobin 12.1; Magnesium 1.7; Platelets 399; Potassium 5.0; Sodium 136   Recent Lipid Panel No results found for: CHOL, HDL, LDLCALC, LDLDIRECT, TRIG, CHOLHDL  Physical Exam:    VS:  BP 111/62   Pulse (!) 53   Resp (!) 95   Ht 6' (1.829 m)   Wt 188 lb 9.6 oz (85.5 kg)   BMI 25.58 kg/m     Wt Readings from Last 3 Encounters:  03/20/20 188 lb 9.6 oz (85.5 kg)  01/25/20 178 lb 6.4 oz (80.9 kg)  01/10/20 177 lb 0.5 oz (80.3 kg)    GEN: Well nourished, well developed in no acute distress HEENT: Normal NECK: No JVD; No carotid bruits LYMPHATICS: No lymphadenopathy CARDIAC: RRR, no murmurs, rubs, gallops RESPIRATORY:  Clear to auscultation without rales, wheezing or rhonchi  ABDOMEN: Soft, non-tender, non-distended MUSCULOSKELETAL:  No edema; No deformity  SKIN: Warm and dry NEUROLOGIC:  Alert and oriented x 3 PSYCHIATRIC:  Normal affect   ASSESSMENT:    1. Chronic combined systolic and diastolic CHF (congestive heart failure) (HCC)   2. Other acute pulmonary embolism with acute cor pulmonale ([place])   3. Typical atrial flutter ([place])   4. Chronic deep vein thrombosis (DVT) of femoral vein of left lower extremity (HCC)   5. PFO (patent foramen ovale)   6. Renal infarct (HCC)    PLAN:    In order of problems listed  above:  1. HFrEF: Etiology undetermined at this time, although likely secondary to NICM as no evidence of ICM on cath. Improvements have been made in EF since initial discovery of HFrEF with recovery of right ventricular function. Left ventricle continues to be reduced function though. Patient has been tolerating Metoprolol without difficulty. He would benefit from an RAAS inhibiting agent, however he had an AKI during hospitalization from renal infarct. Will recheck BMP before initiation of additional medications. Patient is euvolemic on examination today. Will discontinue Lasix and Potassium supplementation. He was instructed that he should developed leg swelling, orthopnea or signs of overload, to contact the clinic, as Lasix may be required at that time. Follow up in 3 months.   2. Submassive PE with chronic DVT: Unprovoked PE with apparent chronic DVT. At this time, patient will require at least 12 months of Eliquis. After that, will need to have a shared-decision making conversation on lifelong anticoagulation. He is tolerating Eliquis well without any evidence of bleeding. No medication changes made today to Eliquis.  3. PFO/ASD: Significant shunting noted on TEE during hospitalization; it was not re-evaluated on repeat outpatient TTE. This may have been provoked by significant right heart strain from PE. He is a candidate for PFO closure should patient desire to come off anti-coagulation after 12 months. Will re-evaluate at that time.  4. Atrial Flutter: Likely triggered by significant right heart strain from PE and newly discovered HFrEF. Patient has converted back to sinus rhythm. Given resolution of right heart strain and return to sinus rhythm, will discontinue Amiodarone at this time. He is eligible for life long anti-coagulation with a CHADsVasc of 3 (CHF, thromboembolism hx).  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  I  have reviewed the chart, notes and new data.  I agree with her note.  Key new complaints: not quite back to baseline, but breathing and stamina have improved markedly Key examination changes: normal CV  exam except trivial swelling L ankle Key new findings / data: reviewed TEE, TTE, cath and CTA  PLAN: Cause of LV dysfunction remains uncertain (consider paradoxical coronary embolism? Preexisting nonischemic CMP?). Add ARB if renal parameters allow. Appears to be euvolemic. Continue sodium restriction, but stop the diuretic.  Atrial arrhythmia may have been transient due to R heart strain. Stop amiodarone. Continue full dose anticoagulation. If no arrhythmia recurrence, consider reducing the anticoagulant to DVT/PE prophylaxis dose after about 6 months from the PE.  Recheck an echo after several more months. If there is still a large R to L shunt, consider PFO closure, but this decision will only become truly relevant if we decide that his anticoagulation can be permanently stopped (I.e. no atrial flutter recurrence and deemed at low risk for recurrent PE).    Sanda Klein, MD, [place] [phone] 03/21/2020, 5:47 PM   Medication Adjustments/Labs and Tests Ordered: Current medicines are reviewed at length with the patient today.  Concerns regarding medicines are outlined above.   Orders Placed This Encounter  Procedures  . Basic metabolic panel   No orders of the defined types were placed in this encounter.   Patient Instructions  Medication Instructions:  STOP the Amiodarone STOP the Furosemide  *If you need a refill on your cardiac medications before your next appointment, please call your pharmacy*   Lab Work: Your provider would like for you to have the following labs today: BMET  If you have labs (blood work) drawn today and your tests are completely normal, you will receive your results only by: [name] MyChart Message (if you have MyChart) OR . A paper copy  in the mail If you have any lab test that is abnormal or we need to change your treatment, we will call you to review the results.   Testing/Procedures: None ordered   Follow-Up: At [facility], you and your health needs are our priority.  As part of our continuing mission to provide you with exceptional heart care, we have created designated Provider Care Teams.  These Care Teams include your primary Cardiologist (physician) and Advanced Practice Providers (APPs -  Physician Assistants and Nurse Practitioners) who all work together to provide you with the care you need, when you need it.  We recommend signing up for the patient portal called "MyChart".  Sign up information is provided on this After Visit Summary.  MyChart is used to connect with patients for Virtual Visits (Telemedicine).  Patients are able to view lab/test results, encounter notes, upcoming appointments, etc.  Non-urgent messages can be sent to your provider as well.   To learn more about what you can do with MyChart, go to [email or web address].    Your next appointment:   3 month(s)  The format for your next appointment:   In Person  Provider:   You may see Sanda Klein, MD or one of the following Advanced Practice Providers on your designated Care Team:    Almyra Deforest, PA-C  Fabian Sharp, [place] or   Roby Lofts, [place]    Signed, Dr. Jose Persia Internal Medicine PGY-2 03/21/2020, 5:47 PM

## 2020-03-20 NOTE — Patient Instructions (Signed)
Medication Instructions:  STOP the Amiodarone STOP the Furosemide  *If you need a refill on your cardiac medications before your next appointment, please call your pharmacy*   Lab Work: Your provider would like for you to have the following labs today: BMET  If you have labs (blood work) drawn today and your tests are completely normal, you will receive your results only by: [name] MyChart Message (if you have MyChart) OR . A paper copy in the mail If you have any lab test that is abnormal or we need to change your treatment, we will call you to review the results.   Testing/Procedures: None ordered   Follow-Up: At [facility], you and your health needs are our priority.  As part of our continuing mission to provide you with exceptional heart care, we have created designated Provider Care Teams.  These Care Teams include your primary Cardiologist (physician) and Advanced Practice Providers (APPs -  Physician Assistants and Nurse Practitioners) who all work together to provide you with the care you need, when you need it.  We recommend signing up for the patient portal called "MyChart".  Sign up information is provided on this After Visit Summary.  MyChart is used to connect with patients for Virtual Visits (Telemedicine).  Patients are able to view lab/test results, encounter notes, upcoming appointments, etc.  Non-urgent messages can be sent to your provider as well.   To learn more about what you can do with MyChart, go to [email or web address].    Your next appointment:   3 month(s)  The format for your next appointment:   In Person  Provider:   You may see Sanda Klein, MD or one of the following Advanced Practice Providers on your designated Care Team:    Almyra Deforest, PA-C  Fabian Sharp, PA-C or   Roby Lofts, [place]

## 2020-03-21 ENCOUNTER — Encounter: Payer: Self-pay | Admitting: Cardiovascular Disease

## 2020-03-25 ENCOUNTER — Telehealth: Payer: Self-pay | Admitting: *Deleted

## 2020-03-25 NOTE — Telephone Encounter (Signed)
Attempted to reach the patient. Was unable to leave a message. Per Dr. Sallyanne Kuster, the patient will need to stop his potassium.

## 2020-03-25 NOTE — Telephone Encounter (Signed)
Called patient- advised of message from RN. Updated med list.  Thanks!

## 2020-03-25 NOTE — Telephone Encounter (Signed)
Patient is returning call.  °

## 2020-03-26 ENCOUNTER — Telehealth: Payer: Self-pay | Admitting: Physician Assistant

## 2020-03-26 NOTE — Telephone Encounter (Signed)
Called patient, gave information from MD.  Patient verbalized understanding,.

## 2020-03-26 NOTE — Telephone Encounter (Signed)
Pt c/o swelling: STAT is pt has developed SOB within 24 hours  1) How much weight have you gained and in what time span? Not sure  2) If swelling, where is the swelling located? Left calf and leg; had blood clot in left leg  3) Are you currently taking a fluid pill? Yea  4) Are you currently SOB? No  5) Do you have a log of your daily weights (if so, list)? No  6) Have you gained 3 pounds in a day or 5 pounds in a week? No  7) Have you traveled recently? No

## 2020-03-26 NOTE — Telephone Encounter (Signed)
A single missed dose of Eliquis is unlikely to be a problem, but try to avoid missing doses in the future. He does have some chronic thrombus in the leg following his episode of DVT, so occasional swelling will not be surprising.  If the swelling goes away after he keeps the leg elevated (for example in bed overnight) I would not worry about it too much.  If he has to be upright for prolonged periods of time, compression stockings will help.  We can prescribe those if he would like [place] to.

## 2020-03-26 NOTE — Telephone Encounter (Signed)
Called patient- he states he noticed swelling in his left leg today- he was outside walking around and working and he states when he sat down he noticed it. He has puffiness, and it is tight feeling from being so swollen, he denies redness, sob, chest pain/tightness, no hardness in calf area, no hot to touch, and no pain when he walks. He states he has continued his medications- and he thinks he may have missed his Eliquis dose last night but that's the only thing that he is aware of. I did advise a blood clot was unlikely- but I would send it back to let Dr.C know and have him give any recommendations.  Patient has been unable to check BP/HR and has no recent readings, when asked if he had gained weight- he states "yeah a few pounds" but did not give numbers or the last time he weighed.   Patient does wear compression stockings, and he has not been elevating his feet, so I suggested he do this since he was outside working in the heat today- swelling can be noted. He does say that his right leg is not swollen only the left-   Will route to MD/RN.

## 2020-03-31 ENCOUNTER — Telehealth: Payer: Self-pay | Admitting: Cardiovascular Disease

## 2020-03-31 NOTE — Telephone Encounter (Signed)
Spoke to patient's wife.Dr.Croitoru's advice given.Stated he never stopped taking Kdur 10 meq daily.She is not at home.She will call back with patient's B/P and pulse.No extender appointments available this week.

## 2020-03-31 NOTE — Telephone Encounter (Signed)
I don't know what to say to that

## 2020-03-31 NOTE — Telephone Encounter (Signed)
Spoke to patient's wife Dr.Croitoru's advice given.Stated they are going to [facility] in the morning and will not be back home until Sunday.No extender appointments available next week.Message sent to Dr.Croitoru.

## 2020-03-31 NOTE — Telephone Encounter (Signed)
Agree with restarting furosemide, also restart the potassium supplement in previous dose. May not need the amiodarone. Are they able to check HR and BP and communicate to [place]? What is earliest we can bring to clinic?

## 2020-03-31 NOTE — Telephone Encounter (Signed)
Do not take amiodarone. Any openings on DOD schedule?

## 2020-03-31 NOTE — Telephone Encounter (Signed)
Follow up     Pts wife is returning call   Please call back  

## 2020-03-31 NOTE — Telephone Encounter (Signed)
Spoke to patient's wife.She stated husband started feeling bad last week,sob,swelling in left leg.O2 sat yesterday morning 70-80 %.She restarted Lasix 20 mg and Amiodarone 200 mg yesterday.O2 sat came back up last night 89 to 90 %. Stated he feels better this morning.Advised I will send message to Dr.Croitoru for advice.

## 2020-03-31 NOTE — Telephone Encounter (Signed)
Pt c/o medication issue:  1. Name of Medication: amiodarone (PACERONE) 200 MG furosemide (LASIX) 40 MG tablet  2. How are you currently taking this medication (dosage and times per day)? 1 tablet of each taking Sunday 03/30/20  3. Are you having a reaction (difficulty breathing--STAT)? Yes   4. What is your medication issue? Vickie is calling stating Mace has been having medication reactions since being taken off of both medications on 03/20/20. Vickie states he experienced low oxygen levels ranging in the 70's & 80's, SOB, left leg swelling, & an elevated HR. Due to this Vickie gave him 1 tablet of each medication yesterday and he is now feeling better with an oxygen level of 90. Please advise.

## 2020-03-31 NOTE — Telephone Encounter (Signed)
Wife called back to report patient's B/P left arm-146/87 pulse 59.Right arm-123/86 pulse 56.O2 sat 89 to 91 %.Advised I will send message to Dr.Croitoru.

## 2020-04-01 ENCOUNTER — Telehealth: Payer: Self-pay | Admitting: *Deleted

## 2020-04-01 NOTE — Telephone Encounter (Signed)
Left a message to call back to schedule an appointment

## 2020-04-01 NOTE — Telephone Encounter (Signed)
The patient is on the way to [facility]. Message sent to scheduling to make an appointment in September.

## 2020-04-01 NOTE — Telephone Encounter (Signed)
A detailed message was left, re: his follow up appointment with Dr.Croitoru.

## 2020-04-02 ENCOUNTER — Telehealth: Payer: Self-pay | Admitting: Cardiovascular Disease

## 2020-04-02 NOTE — Telephone Encounter (Signed)
Appropriate advice. Thank you.

## 2020-04-02 NOTE — Telephone Encounter (Signed)
New Message:   Wife says she needs to talk to the nurse. Ptis still having problems with shortness of breath.They are in [place] at this time.

## 2020-04-02 NOTE — Telephone Encounter (Signed)
Patient's wife is calling to follow up. She states the patient did go to the hospital and she would like a call back from the nurse to provide an update.

## 2020-04-02 NOTE — Telephone Encounter (Signed)
The patient and wife called in with concerns about the patient's worsening shortness of breath and oxygen levels. She was advised that a call was placed to them yesterday to have the patient come in for an appointment but they were on the way to [facility].  The wife stated that the patient's oxygen level is running in the 70's. He is having a hard time catching his breath. He denies a fever and edema. Both the wife and the patient were coughing while on the phone but they were also laughing and in good spirits.   She did say that the patient did work on a Chiropractor" house a few days ago and wonders if the cough has something to do with the state of the house he worked in. She has been advised that since she is coughing as well that they may have a virus.  She has been advised that they need to go to the ED or if the patient is in distress that they need to call 911. She stated that they will go to the local Urgent Care there in [facility].

## 2020-04-02 NOTE — Telephone Encounter (Signed)
Patient is calling to follow up with Dr. Victorino December nurse. He states he is requesting to discuss test results. However, I do not see any updated test results. Please call.

## 2020-04-02 NOTE — Telephone Encounter (Signed)
The wife called and stated that the patient is getting admitted. He does have pneumonia and they feel like he may also have Covid. Test is pending.

## 2020-04-02 NOTE — Telephone Encounter (Signed)
The patient's wife called to say they did go to the local hospital. She was calling to give an update. He is being tested for covid and will let us know when the results come in.

## 2020-04-03 NOTE — Telephone Encounter (Signed)
The patient has been made aware that the hospital in [facility] needs to send a release form with his signature to the office with what they are requesting.

## 2020-04-03 NOTE — Telephone Encounter (Signed)
Patient is calling to follow up with Lattie Haw regarding pneumonia. He states he is still in the hospital in [place] and he is requesting to have his medical records released to them. He did not specify regarding what may be sent. However he provided a fax number for the hospital: [phone].

## 2020-04-05 ENCOUNTER — Telehealth: Payer: Self-pay | Admitting: Physician Assistant

## 2020-04-05 NOTE — Telephone Encounter (Addendum)
   The patient's wife Olegario Shearer called the answering service after-hours today. She was very emotional. She is in a terrible position. She states they went to [facility] for his birthday and have been very careful. Unfortunately a few days ago the patient developed worsening hypoxia in the 70s so they took him to the hospital. She tells me he tested negative for Covid by swab but that the team strongly thinks he has Covid based on his CT scans although he has terrible lungs at baseline per his report. So he is being treated for such and they feel he will need hospitalization at least through Monday. She is upset because they are supposed to fly out today and she doesn't want to leave him alone while she comes back to [place]. She asked my opinion on whether she should sign him out AMA, take him on the plane then come directly to [facility]. We had a long discussion strongly advising against this including the dangers of exposing numerous other people and staff to a deadly disease and that that this would come with severe legal implications as well if they evade the screening process. I also told her that if he is critically ill that I would strongly advise against going on an airplane ride due to risk of acute decompensation which can even lead to death. I verbalized my sympathy to this tough situation and also reiterated that it is extremely important for him to remain inpatient so that he can have a consistent team following and treating his progress. I let her know that if she booked her trip through a credit card she might inquire whether she has automatic trip insurance as this might help allow her to offset trip costs to stay local to him. I also encouraged her to reach out to the hospital for nearby lodging options. After long discussion his wife agreed with this sentiment and says she plans to speak with his treating team about his progress. I encouraged her to have him sign a release of information and have them  call us if they need to discuss his prior cardiac history to help maintain that continuity of care.  She did ask me to send this to Dr. Sallyanne Kuster and said that if he happens to read this she would appreciate if he can give her a call as well if he has a moment. Her number is [phone]. I let her know he's not working this weekend but did let her know I am happy to forward this message along.  She verbalized understanding and gratitude.  Charlie Pitter, PA-C

## 2020-04-06 ENCOUNTER — Telehealth: Payer: Self-pay | Admitting: Physician Assistant

## 2020-04-06 NOTE — Telephone Encounter (Signed)
I will call her tomorrow.

## 2020-04-06 NOTE — Telephone Encounter (Signed)
   The patient's wife called to discuss concern over patient's plan of care. See phone note yesterday about patient being admitted in [facility] and being treated for possible Covid. Wife is concerned about why they are treating him with insulin coverage even though he's previously been pre-DM. I told her since we are not involved in his care there I cannot be certain of the exact indication but we discussed that when someone is acutely ill, strict blood sugar control is important therefore frequently has to be covered with insulin.   She said she was very grateful for the conversation yesterday in which I recommended that she not sign him out AMA. She agrees he needs to remain inpatient but just struggles with anxiety over the changes day to day.  I encouraged her to request a telephone meeting with the team that is treating him to allay her concerns.  She was appreciative of advice and callback.  Charlie Pitter, PA-C

## 2020-04-07 NOTE — Telephone Encounter (Signed)
Patient's wife called back-  Discussed that they were still stuck in [place]-she is still very upset on the phone and just unsure of what to do.  I did see message that MD was going to contact her today to discuss further what options were available.  Wife was very thankful on the return call.

## 2020-04-07 NOTE — Telephone Encounter (Signed)
Patient's wife is calling to follow up. She states she is requesting to speak with Dr. Victorino December nurse to update her.

## 2020-04-09 ENCOUNTER — Other Ambulatory Visit: Payer: Self-pay

## 2020-04-09 ENCOUNTER — Emergency Department (HOSPITAL_COMMUNITY)
Admission: EM | Admit: 2020-04-09 | Discharge: 2020-04-10 | Disposition: A | Discharge status: Home / Self Care | Payer: No Typology Code available for payment source | Attending: Emergency Medicine | Admitting: Emergency Medicine

## 2020-04-09 ENCOUNTER — Telehealth: Payer: Self-pay | Admitting: Cardiovascular Disease

## 2020-04-09 DIAGNOSIS — K921 Melena: Secondary | ICD-10-CM | POA: Insufficient documentation

## 2020-04-09 DIAGNOSIS — I509 Heart failure, unspecified: Secondary | ICD-10-CM | POA: Insufficient documentation

## 2020-04-09 DIAGNOSIS — Z20822 Contact with and (suspected) exposure to covid-19: Secondary | ICD-10-CM | POA: Insufficient documentation

## 2020-04-09 DIAGNOSIS — Z7951 Long term (current) use of inhaled steroids: Secondary | ICD-10-CM | POA: Diagnosis not present

## 2020-04-09 DIAGNOSIS — Z79899 Other long term (current) drug therapy: Secondary | ICD-10-CM | POA: Insufficient documentation

## 2020-04-09 DIAGNOSIS — F17200 Nicotine dependence, unspecified, uncomplicated: Secondary | ICD-10-CM | POA: Insufficient documentation

## 2020-04-09 DIAGNOSIS — R112 Nausea with vomiting, unspecified: Secondary | ICD-10-CM | POA: Diagnosis not present

## 2020-04-09 DIAGNOSIS — J449 Chronic obstructive pulmonary disease, unspecified: Secondary | ICD-10-CM | POA: Diagnosis not present

## 2020-04-09 DIAGNOSIS — Z9861 Coronary angioplasty status: Secondary | ICD-10-CM | POA: Insufficient documentation

## 2020-04-09 DIAGNOSIS — R1084 Generalized abdominal pain: Secondary | ICD-10-CM | POA: Diagnosis present

## 2020-04-09 DIAGNOSIS — R0602 Shortness of breath: Secondary | ICD-10-CM | POA: Diagnosis not present

## 2020-04-09 DIAGNOSIS — R0789 Other chest pain: Secondary | ICD-10-CM | POA: Diagnosis not present

## 2020-04-09 LAB — COMPREHENSIVE METABOLIC PANEL
ALT: 23 U/L (ref 0–44)
AST: 23 U/L (ref 15–41)
Albumin: 2.4 g/dL — ABNORMAL LOW (ref 3.5–5.0)
Alkaline Phosphatase: 99 U/L (ref 38–126)
Anion gap: 6 (ref 5–15)
BUN: 35 mg/dL — ABNORMAL HIGH (ref 6–20)
CO2: 19 mmol/L — ABNORMAL LOW (ref 22–32)
Calcium: 8.3 mg/dL — ABNORMAL LOW (ref 8.9–10.3)
Chloride: 114 mmol/L — ABNORMAL HIGH (ref 98–111)
Creatinine, Ser: 1.88 mg/dL — ABNORMAL HIGH (ref 0.61–1.24)
GFR calc Af Amer: 44 mL/min — ABNORMAL LOW (ref >60)
GFR calc non Af Amer: 38 mL/min — ABNORMAL LOW (ref >60)
Glucose, Bld: 108 mg/dL — ABNORMAL HIGH (ref 70–99)
Potassium: 5.1 mmol/L (ref 3.5–5.1)
Sodium: 139 mmol/L (ref 135–145)
Total Bilirubin: 0.9 mg/dL (ref 0.3–1.2)
Total Protein: 5.8 g/dL — ABNORMAL LOW (ref 6.5–8.1)

## 2020-04-09 LAB — CBC
HCT: 44.1 % (ref 39.0–52.0)
Hemoglobin: 15.2 g/dL (ref 13.0–17.0)
MCH: 28.6 pg (ref 26.0–34.0)
MCHC: 34.5 g/dL (ref 30.0–36.0)
MCV: 82.9 fL (ref 80.0–100.0)
Platelets: 322 10*3/uL (ref 150–400)
RBC: 5.32 MIL/uL (ref 4.22–5.81)
RDW: 20.2 % — ABNORMAL HIGH (ref 11.5–15.5)
WBC: 13.8 10*3/uL — ABNORMAL HIGH (ref 4.0–10.5)
nRBC: 5.5 % — ABNORMAL HIGH (ref 0.0–0.2)

## 2020-04-09 LAB — URINALYSIS, ROUTINE W REFLEX MICROSCOPIC
Bilirubin Urine: NEGATIVE
Glucose, UA: NEGATIVE mg/dL
Ketones, ur: NEGATIVE mg/dL
Leukocytes,Ua: NEGATIVE
Nitrite: NEGATIVE
Protein, ur: NEGATIVE mg/dL
Specific Gravity, Urine: 1.012 (ref 1.005–1.030)
pH: 5 (ref 5.0–8.0)

## 2020-04-09 LAB — LIPASE, BLOOD: Lipase: 26 U/L (ref 11–51)

## 2020-04-09 NOTE — Telephone Encounter (Signed)
Patient said he got back from [place] and is having stomach issues. He says Lattie Haw is aware of his situation and he would like to speak to [place] as soon as he could

## 2020-04-09 NOTE — Telephone Encounter (Signed)
Call placed to the patient. Unable to leave a message.

## 2020-04-09 NOTE — Telephone Encounter (Signed)
Please make sure he is still takig omeprazole 40 mg daily. Ask him not to take any NSAIDs including OTC ibuprofen or naproxen. Come in for BMET and CBC please and bring any records he was given in [facility] Then try to squeeze on next week schedule.Marland KitchenMarland KitchenMarland Kitchen

## 2020-04-09 NOTE — Telephone Encounter (Signed)
Patient called in to office this morning with complaints of Stomach Pain that he states was in the middle of his stomach and was wondering what he could take to help alleviate this. Patient was recently out of town in [place] and went to the hospital with low oxygen levels. Patient states that while he was in the hospital he was having bloody stools with large amounts of blood and his last bloody stool was yesterday in the hotel on his way home and states that the blood in his stool yesterday was a smaller amount.Pt stated that while hospitalized he did not have any testing to determine where the bleeding was coming from as he did not let them know he was bleeding because he was scared. Pt denies nausea and vomiting at this time and his only complaint as of now is the stomach pain.  Will route to MD for recommendation

## 2020-04-09 NOTE — ED Triage Notes (Signed)
Pt endorses central abdominal pain since last Wednesday when he was "being pumped full of all kinds of medications" in the hospital for shob in [facility]. Sts n/v/d x 2 days at the beginning but none since then.

## 2020-04-10 ENCOUNTER — Emergency Department (HOSPITAL_COMMUNITY): Payer: No Typology Code available for payment source

## 2020-04-10 LAB — TROPONIN I (HIGH SENSITIVITY)
Troponin I (High Sensitivity): 18 ng/L — ABNORMAL HIGH (ref <18)
Troponin I (High Sensitivity): 21 ng/L — ABNORMAL HIGH (ref <18)

## 2020-04-10 LAB — SARS CORONAVIRUS 2 BY RT PCR (HOSPITAL ORDER, PERFORMED IN ~~LOC~~ HOSPITAL LAB): SARS Coronavirus 2: NEGATIVE

## 2020-04-10 MED ORDER — IOHEXOL 350 MG/ML SOLN
100.0000 mL | Freq: Once | INTRAVENOUS | Status: AC | PRN
  Administered 2020-04-10: 100 mL via INTRAVENOUS

## 2020-04-10 MED ORDER — SODIUM CHLORIDE 0.9 % IV BOLUS
1000.0000 mL | Freq: Once | INTRAVENOUS | Status: AC
  Administered 2020-04-10: 1000 mL via INTRAVENOUS

## 2020-04-10 NOTE — ED Notes (Signed)
IV team at bedside 

## 2020-04-10 NOTE — ED Notes (Signed)
Pt refusing COVID Swab EDP aware.

## 2020-04-10 NOTE — ED Notes (Signed)
Pt tolerated PO challenge well. Pt drank water and consumed crackers without issue. Findings reported to EDP.

## 2020-04-10 NOTE — ED Notes (Signed)
This RN attempted IV access x2 with no success, consulted staff RN Lowella Petties, Unable to obtained IV access- IV team consult order placed. EDP aware

## 2020-04-10 NOTE — Telephone Encounter (Signed)
Patient called stating he is currently in the ER being "checked out" the doctor there is running more test.

## 2020-04-10 NOTE — ED Notes (Signed)
Pt discharged home per MD order. Discharge summary reviewed with pt, pt verbalizes understanding. Off unit via WC- No s/s of acute distress noted. Reports discharge ride home.

## 2020-04-10 NOTE — ED Provider Notes (Signed)
[facility] EMERGENCY DEPARTMENT Provider Note   CSN: [id number] Arrival date & time: 04/09/20  1659     History Chief Complaint  Patient presents with  . Abdominal Pain    Cody Joyce is a 59 y.o. male.  The history is provided by the patient and medical records. No language interpreter was used.  Abdominal Pain Pain location:  Generalized Pain quality: aching, cramping and sharp   Pain radiates to:  Does not radiate Pain severity:  Severe Onset quality:  Gradual Duration:  3 days Timing:  Intermittent Progression:  Waxing and waning Chronicity:  New Context: recent travel   Relieved by:  Nothing Worsened by:  Nothing Ineffective treatments:  None tried Associated symptoms: chest pain, hematochezia, nausea, shortness of breath and vomiting   Associated symptoms: no chills, no constipation, no cough, no diarrhea, no dysuria, no fatigue, no fever and no flatus        Past Medical History:  Diagnosis Date  . Acute deep vein thrombosis (DVT) of left lower extremity ([place]) 01/03/2020  . Acute pulmonary embolism with acute cor pulmonale ([place]) 12/30/2019  . Acute systolic CHF (congestive heart failure) ([place]) 01/03/2020  . Alcohol dependence ([place]) 07/25/2018  . Aortic atherosclerosis ([place]) 08/09/2018  . COPD (chronic obstructive pulmonary disease) ([place]) 07/25/2018  . Emphysema of lung ([place])   . 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 We will we ultrasound at followup the   . Mediastinal adenopathy 07/25/2018  . Multiple lung nodules on CT 07/25/2018  . Non-STEMI (non-ST elevated myocardial infarction) ([place]) 01/03/2020  . Osteoarthritis of left knee 05/30/2013   Tricompartmental disease Free-floating mass mostly in the superior lateral patellar compartment.   [name] PFO (patent foramen ovale) 01/03/2020  . Pulmonary embolism ([place]) 12/29/2019  . Renal infarct [facility])     Patient Active Problem List   Diagnosis Date Noted  . Typical atrial  flutter ([place])   . PFO (patent foramen ovale) 01/03/2020  . Acute systolic CHF (congestive heart failure) ([place]) 01/03/2020  . Non-STEMI (non-ST elevated myocardial infarction) ([place]) 01/03/2020  . Acute deep vein thrombosis (DVT) of left lower extremity ([place]) 01/03/2020  . Acute pulmonary embolism with acute cor pulmonale ([place]) 12/30/2019  . Pulmonary embolism ([place]) 12/29/2019  . Renal infarct ([place])   . Aortic atherosclerosis ([facility]) 08/09/2018  . COPD (chronic obstructive pulmonary disease) ([place]) 07/25/2018  . Alcohol dependence ([place]) 07/25/2018  . Mediastinal adenopathy 07/25/2018  . Multiple lung nodules on CT 07/25/2018  . 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 Osteoarthritis of left knee 05/30/2013    Past Surgical History:  Procedure Laterality Date  . BUBBLE STUDY  01/03/2020   Procedure: BUBBLE STUDY;  Surgeon: [name], MD;  Location: [facility];  Service: Cardiovascular;;  . IR ANGIOGRAM PULMONARY BILATERAL SELECTIVE  12/30/2019  . IR ANGIOGRAM SELECTIVE EACH ADDITIONAL VESSEL  12/30/2019  . IR ANGIOGRAM SELECTIVE EACH ADDITIONAL VESSEL  12/30/2019  . IR THROMBECT PRIM MECH ADD (INCLU) MOD SED  12/30/2019  . IR THROMBECT PRIM MECH INIT (INCLU) MOD SED  12/30/2019  . IR US GUIDE VASC ACCESS RIGHT  12/30/2019  . RIGHT/LEFT HEART CATH AND CORONARY ANGIOGRAPHY N/A 01/02/2020   Procedure: RIGHT/LEFT HEART CATH AND CORONARY ANGIOGRAPHY;  Surgeon: [name], MD;  Location: [facility] CV LAB;  Service: Cardiovascular;  Laterality: N/A;  . TEE WITHOUT CARDIOVERSION N/A 01/03/2020   Procedure: TRANSESOPHAGEAL ECHOCARDIOGRAM (TEE)   DEFINITY ;  Surgeon: [name], MD;  Location: [facility]  ENDOSCOPY;  Service: Cardiovascular;  Laterality: N/A;       Family History  Problem Relation Age of Onset  . Glaucoma Mother   . Heart disease Father        stents/CAD/AMI age 52s.  . Glaucoma Father     Social History   Tobacco Use  . Smoking status: Light Tobacco Smoker  .  Smokeless tobacco: Never Used  Vaping Use  . Vaping Use: Never used  Substance Use Topics  . Alcohol use: Yes    Alcohol/week: 6.0 standard drinks    Types: 2 Glasses of wine, 4 Cans of beer per week  . Drug use: No    Home Medications Prior to Admission medications   Medication Sig Start Date End Date Taking? Authorizing Provider  acetaminophen (TYLENOL) 500 MG tablet Take 1,000 mg by mouth every 6 (six) hours as needed for headache (pain).    [provider]  albuterol (VENTOLIN HFA) 108 (90 Base) MCG/ACT inhaler Inhale 2 puffs into the lungs 4 (four) times daily.    [provider]  apixaban (ELIQUIS) 5 MG TABS tablet Take 1 tablet (5 mg total) by mouth 2 (two) times daily. **start this after completing Eliquis Starter Pack** 01/09/20   Regalado, Belkys A, MD  APIXABAN (ELIQUIS) VTE STARTER PACK (10MG  AND 5MG ) Take as directed on package: start with two-5mg  tablets twice daily for 7 days. On day 8, switch to one-5mg  tablet twice daily. 01/09/20   Regalado, Belkys A, MD  diclofenac Sodium (VOLTAREN) 1 % GEL Apply 1 application topically 3 (three) times daily as needed (knee pain).    [provider]  furosemide (LASIX) 40 MG tablet Take 1/2 tablet 20 mg daily 03/31/20   Croitoru, Mihai, MD  LIDOCAINE EX Apply 1 application topically daily as needed (pain).    [provider]  metoprolol succinate (TOPROL-XL) 25 MG 24 hr tablet Take 3 tablets (75 mg total) by mouth daily. 01/10/20   Regalado, Belkys A, MD  Multiple Vitamin (MULTIVITAMIN WITH MINERALS) TABS tablet Take 1 tablet by mouth daily.    [provider]  omeprazole (PRILOSEC) 20 MG capsule Take 20 mg by mouth 2 (two) times daily before a meal.    [provider]  potassium chloride (KLOR-CON) 10 MEQ tablet Take 10 mEq by mouth daily.    [provider]    Allergies    Patient has no known allergies.  Review of Systems   Review of Systems  Constitutional: Negative for  chills, diaphoresis, fatigue and fever.  HENT: Negative for congestion.   Eyes: Negative for visual disturbance.  Respiratory: Positive for shortness of breath. Negative for cough, chest tightness and wheezing.   Cardiovascular: Positive for chest pain. Negative for palpitations and leg swelling.  Gastrointestinal: Positive for abdominal pain, blood in stool, hematochezia, nausea and vomiting. Negative for constipation, diarrhea and flatus.  Genitourinary: Negative for dysuria and flank pain.  Musculoskeletal: Negative for back pain, neck pain and neck stiffness.  Neurological: Negative for light-headedness and headaches.  Psychiatric/Behavioral: Negative for agitation.  All other systems reviewed and are negative.   Physical Exam Updated Vital Signs BP (!) 143/98   Pulse (!) 57   Temp 98.9 F (37.2 C)   Resp 20   SpO2 98%   Physical Exam Vitals and nursing note reviewed.  Constitutional:      General: He is not in acute distress.    Appearance: He is well-developed. He is not ill-appearing, toxic-appearing or diaphoretic.  HENT:     Head: Normocephalic and atraumatic.     Mouth/Throat:     Mouth: Mucous membranes are moist.  Eyes:     General: No scleral icterus.    Conjunctiva/sclera: Conjunctivae normal.  Cardiovascular:     Rate and Rhythm: Normal rate and regular rhythm.     Heart sounds: Normal heart sounds. No murmur heard.   Pulmonary:     Effort: Pulmonary effort is normal. No respiratory distress.     Breath sounds: Normal breath sounds. No wheezing, rhonchi or rales.  Chest:     Chest wall: No tenderness.  Abdominal:     General: Abdomen is flat. Bowel sounds are normal.     Palpations: Abdomen is soft.     Tenderness: There is no abdominal tenderness. There is no right CVA tenderness, left CVA tenderness, guarding or rebound.  Musculoskeletal:     Cervical back: Neck supple.  Skin:    General: Skin is warm and dry.     Capillary Refill: Capillary refill  takes less than 2 seconds.     Findings: No rash.  Neurological:     General: No focal deficit present.     Mental Status: He is alert.  Psychiatric:        Mood and Affect: Mood normal.     ED Results / Procedures / Treatments   Labs (all labs ordered are listed, but only abnormal results are displayed) Labs Reviewed  COMPREHENSIVE METABOLIC PANEL - Abnormal; Notable for the following components:      Result Value   Chloride 114 (*)    CO2 19 (*)    Glucose, Bld 108 (*)    BUN 35 (*)    Creatinine, Ser 1.88 (*)    Calcium 8.3 (*)    Total Protein 5.8 (*)    Albumin 2.4 (*)    GFR calc non Af Amer 38 (*)    GFR calc Af Amer 44 (*)    All other components within normal limits  CBC - Abnormal; Notable for the following components:   WBC 13.8 (*)    RDW 20.2 (*)    nRBC 5.5 (*)    All other components within normal limits  URINALYSIS, ROUTINE W REFLEX MICROSCOPIC - Abnormal; Notable for the following components:   Hgb urine dipstick MODERATE (*)    Bacteria, UA RARE (*)    All other components within normal limits  TROPONIN I (HIGH SENSITIVITY) - Abnormal; Notable for the following components:   Troponin I (High Sensitivity) 18 (*)    All other components within normal limits  TROPONIN I (HIGH SENSITIVITY) - Abnormal; Notable for the following components:   Troponin I (High Sensitivity) 21 (*)    All other components within normal limits  SARS CORONAVIRUS 2 BY RT PCR [facility] ORDER, [place] LAB)  LIPASE, BLOOD    EKG EKG Interpretation  Date/Time:  Thursday April 10 2020 09:05:29 EDT Ventricular Rate:  54 PR Interval:    QRS Duration: 115 QT Interval:  485 QTC Calculation: 460 R Axis:   32 Text Interpretation: Sinus or ectopic atrial rhythm Probable left atrial enlargement Nonspecific intraventricular conduction delay Borderline repolarization abnormality When comapred to prior, now sinus brady. no STEMI Confirmed by Antony Blackbird  [phone]) on 04/10/2020 10:12:13 AM   Radiology CT Angio Chest PE W and/or Wo Contrast  Result Date: 04/10/2020 CLINICAL DATA:  Chest pain, epigastric abdominal pain. EXAM: CT ANGIOGRAPHY CHEST, ABDOMEN AND PELVIS TECHNIQUE:  Non-contrast CT of the chest was initially obtained. Multidetector CT imaging through the chest, abdomen and pelvis was performed using the standard protocol during bolus administration of intravenous contrast. Multiplanar reconstructed images and MIPs were obtained and reviewed to evaluate the vascular anatomy. CONTRAST:  60 mL OMNIPAQUE IOHEXOL 350 MG/ML SOLN COMPARISON:  December 29, 2019. FINDINGS: CTA CHEST FINDINGS Cardiovascular: Satisfactory opacification of the pulmonary arteries to the segmental level. No evidence of acute pulmonary embolism. Some mural thickening is noted in the distal right pulmonary artery consistent with old thrombus. Large bilateral pulmonary emboli noted on prior exam have largely resolved. Mild cardiomegaly is noted. No pericardial effusion. Mediastinum/Nodes: No enlarged mediastinal, hilar, or axillary lymph nodes. Thyroid gland, trachea, and esophagus demonstrate no significant findings. Lungs/Pleura: No pneumothorax or pleural effusion is noted. Mild bibasilar subsegmental atelectasis is noted. Emphysematous disease is noted throughout both lungs. Musculoskeletal: No chest wall abnormality. No acute or significant osseous findings. Review of the MIP images confirms the above findings. CTA ABDOMEN AND PELVIS FINDINGS VASCULAR Aorta: Normal caliber aorta without aneurysm, dissection, vasculitis or significant stenosis. Celiac: Patent without evidence of aneurysm, dissection, vasculitis or significant stenosis. SMA: Patent without evidence of aneurysm, dissection, vasculitis or significant stenosis. Renals: Both renal arteries are patent without evidence of aneurysm, dissection, vasculitis, fibromuscular dysplasia or significant stenosis. IMA: Patent without  evidence of aneurysm, dissection, vasculitis or significant stenosis. Inflow: Patent without evidence of aneurysm, dissection, vasculitis or significant stenosis. Veins: No obvious venous abnormality within the limitations of this arterial phase study. Review of the MIP images confirms the above findings. NON-VASCULAR Hepatobiliary: No focal liver abnormality is seen. No gallstones, gallbladder wall thickening, or biliary dilatation. Pancreas: Unremarkable. No pancreatic ductal dilatation or surrounding inflammatory changes. Spleen: Normal in size without focal abnormality. Adrenals/Urinary Tract: Adrenal glands are unremarkable. Kidneys are normal, without renal calculi, focal lesion, or hydronephrosis. Bladder is unremarkable. Stomach/Bowel: Stomach is within normal limits. Appendix appears normal. No evidence of bowel wall thickening, distention, or inflammatory changes. Lymphatic: No significant adenopathy is noted. Reproductive: Prostate is unremarkable. Other: No abdominal wall hernia or abnormality. No abdominopelvic ascites. Musculoskeletal: No acute or significant osseous findings. Review of the MIP images confirms the above findings. IMPRESSION: 1. No evidence of acute pulmonary embolus. 2. No evidence of abdominal aortic dissection or aneurysm. No significant stenosis seen involving the mesenteric or renal arteries. 3. Mild bibasilar subsegmental atelectasis is noted. 4. No acute abnormality seen in the abdomen or pelvis. Emphysema (ICD10-J43.9). Electronically Signed   By: Marijo Conception M.D.   On: 04/10/2020 13:26   CT ANGIO ABDOMEN PELVIS  W &/OR WO CONTRAST  Result Date: 04/10/2020 CLINICAL DATA:  Chest pain, epigastric abdominal pain. EXAM: CT ANGIOGRAPHY CHEST, ABDOMEN AND PELVIS TECHNIQUE: Non-contrast CT of the chest was initially obtained. Multidetector CT imaging through the chest, abdomen and pelvis was performed using the standard protocol during bolus administration of intravenous  contrast. Multiplanar reconstructed images and MIPs were obtained and reviewed to evaluate the vascular anatomy. CONTRAST:  60 mL OMNIPAQUE IOHEXOL 350 MG/ML SOLN COMPARISON:  December 29, 2019. FINDINGS: CTA CHEST FINDINGS Cardiovascular: Satisfactory opacification of the pulmonary arteries to the segmental level. No evidence of acute pulmonary embolism. Some mural thickening is noted in the distal right pulmonary artery consistent with old thrombus. Large bilateral pulmonary emboli noted on prior exam have largely resolved. Mild cardiomegaly is noted. No pericardial effusion. Mediastinum/Nodes: No enlarged mediastinal, hilar, or axillary lymph nodes. Thyroid gland, trachea, and esophagus demonstrate no significant findings.  Lungs/Pleura: No pneumothorax or pleural effusion is noted. Mild bibasilar subsegmental atelectasis is noted. Emphysematous disease is noted throughout both lungs. Musculoskeletal: No chest wall abnormality. No acute or significant osseous findings. Review of the MIP images confirms the above findings. CTA ABDOMEN AND PELVIS FINDINGS VASCULAR Aorta: Normal caliber aorta without aneurysm, dissection, vasculitis or significant stenosis. Celiac: Patent without evidence of aneurysm, dissection, vasculitis or significant stenosis. SMA: Patent without evidence of aneurysm, dissection, vasculitis or significant stenosis. Renals: Both renal arteries are patent without evidence of aneurysm, dissection, vasculitis, fibromuscular dysplasia or significant stenosis. IMA: Patent without evidence of aneurysm, dissection, vasculitis or significant stenosis. Inflow: Patent without evidence of aneurysm, dissection, vasculitis or significant stenosis. Veins: No obvious venous abnormality within the limitations of this arterial phase study. Review of the MIP images confirms the above findings. NON-VASCULAR Hepatobiliary: No focal liver abnormality is seen. No gallstones, gallbladder wall thickening, or biliary  dilatation. Pancreas: Unremarkable. No pancreatic ductal dilatation or surrounding inflammatory changes. Spleen: Normal in size without focal abnormality. Adrenals/Urinary Tract: Adrenal glands are unremarkable. Kidneys are normal, without renal calculi, focal lesion, or hydronephrosis. Bladder is unremarkable. Stomach/Bowel: Stomach is within normal limits. Appendix appears normal. No evidence of bowel wall thickening, distention, or inflammatory changes. Lymphatic: No significant adenopathy is noted. Reproductive: Prostate is unremarkable. Other: No abdominal wall hernia or abnormality. No abdominopelvic ascites. Musculoskeletal: No acute or significant osseous findings. Review of the MIP images confirms the above findings. IMPRESSION: 1. No evidence of acute pulmonary embolus. 2. No evidence of abdominal aortic dissection or aneurysm. No significant stenosis seen involving the mesenteric or renal arteries. 3. Mild bibasilar subsegmental atelectasis is noted. 4. No acute abnormality seen in the abdomen or pelvis. Emphysema (ICD10-J43.9). Electronically Signed   By: Marijo Conception M.D.   On: 04/10/2020 13:26    Procedures Procedures (including critical care time)  Medications Ordered in ED Medications  sodium chloride 0.9 % bolus 1,000 mL (1,000 mLs Intravenous New Bag/Given 04/10/20 1036)  iohexol (OMNIPAQUE) 350 MG/ML injection 100 mL (100 mLs Intravenous Contrast Given 04/10/20 1234)    ED Course  I have reviewed the triage vital signs and the nursing notes.  Pertinent labs & imaging results that were available during my care of the patient were reviewed by me and considered in my medical decision making (see chart for details).    MDM Rules/Calculators/A&P                          8:31 AM Of note, patient was waiting 15 hours and 20 minutes in the ED before my evaluation.  Cody Joyce is a 59 y.o. male with a past medical history significant for COPD, CHF, atrial flutter on Eliquis  therapy, and submassive pulmonary embolism several months ago with subsequent IR intervention and associated renal infarct likely due to PFO who presents with intermittent chest pain shortness of breath and severe abdominal pain with nausea, vomiting, and bloody stools.  Patient reports that he returned from [facility] on vacation several days ago.  He reports that while in [place], he was developing chest pain and shortness of breath and had hypoxia in the 70s.  He reports that he went to the hospital there and had a work-up which did not show acute pulmonary ballismus he reports.  He was told that he may have Covid although he reports having 3 negative Covid test while there.  He says that before leaving to come home, he  started having severe abdominal pain.  He reports that some nausea and vomiting and bloody stools for a day or 2 before that began to improve.  He now is having waxing and waning severe abdominal pain which he describes as up to a 20 out of 10 in severity.  He reports his abdomen is nontender but it is hurting severely.  He returned to [place] 2 days ago and then yesterday the pain did not improve so he came to emergency department.  He has been here for over 15 hours waiting to be seen by provider.  He reports that during his travels and being at the other hospital and waiting here in the emergency department, he has missed several doses of his Eliquis.  He describes the pain as aching and sharp and cramping.  He reports that he is unsure if eating worsens the symptoms.  He has not had anything to drink overnight with the pain.  He describes it is currently a 6 out of 10 in severity.  He reports no fevers, chills, congestion, or cough.  He denies any sick contacts.  Denies any urinary changes.  On exam, abdomen is nontender and bowel sounds were appreciated.  Lungs were clear.  No murmur.  Chest nontender.  Back nontender.  No rashes seen.  No injuries.  No leg pain or leg tenderness on my  exam.  Good pulses in extremities.  Based on patient description of symptoms I am somewhat concerned that patient may have had recurrent thromboembolic disease either causing small pulmonary emboli leading to the hypoxia versus having bowel ischemia with his known PFO and previous renal infarct.  He reports they did not do any imaging of his abdomen as his abdomen was not hurting when he was initially having hypoxia.  With his bloody stools I am somewhat concerned about this as well.  Patient had some screening labs done hours ago which shows he does have an elevated creatinine of 1.88.  He has a leukocytosis of 13.8 with a normal hemoglobin.  Lipase not elevated.  Urinalysis shows some blood but no infection.  We will get a PE study with his intermittent chest pain shortness of breath as well as a CTA of the abdomen pelvis to look for ischemic bowel with his known PFO with renal infarct.  I am concerned that a small piece of clot may have gone through the PFO and instead of hitting the kidney like previously, may have caused bowel ischemia.  We will add on an EKG, troponin, and watch him on telemetry.  We will get some fluids as he does not eat or drink overnight and he has elevated creatinine.  He does not want pain medicine initially as his pain is waxing waning only 6 out of 10.  If it increases will give pain medicine.  Patient agrees to plan of care.  3:01 PM Work-up is returned overall reassuring.  Covid test negative.  Troponin greatly improved from recent elevated values.  CT of the chest, abdomen, and pelvis did not show evidence of thromboembolic abnormality with no pulmonary embolism or evidence of ischemic bowel or kidneys.  His labs are also similar to prior.  Covid negative.  Patient reports he is feeling much better.  Suspect patient symptoms may relate to his recent nausea, vomiting and diarrhea that he had on his vacation.  As the symptoms have improved and the patient appears well,  feel he safe for discharge home.  He was instructed to follow-up  with his PCP and understood return precautions.  Patient otherwise or concerns and was discharged in good condition.  Final Clinical Impression(s) / ED Diagnoses Final diagnoses:  Generalized abdominal pain    Rx / DC Orders ED Discharge Orders    None      Clinical Impression: 1. Generalized abdominal pain     Disposition: Discharge  Condition: Good  I have discussed the results, Dx and Tx plan with the pt(& family if present). He/she/they expressed understanding and agree(s) with the plan. Discharge instructions discussed at great length. Strict return precautions discussed and pt &/or family have verbalized understanding of the instructions. No further questions at time of discharge.    New Prescriptions   No medications on file    Follow Up: Clinic, [place] [address] [phone]     [place] [address] [id number] [place]       Wenceslao Loper, Gwenyth Allegra, MD 04/10/20 [phone]

## 2020-04-10 NOTE — Telephone Encounter (Signed)
Will route to primary nurse to make aware.  Patient in ED.

## 2020-04-10 NOTE — ED Notes (Signed)
Pt argumentative with nursing staff- video taping this RN while in patient room  , charge RN Gabriel Cirri, and security made aware.

## 2020-04-10 NOTE — Telephone Encounter (Signed)
Follow up appointment on 05/16/20 with Dr. Sallyanne Kuster.

## 2020-04-10 NOTE — ED Notes (Signed)
Pt would not complete  Stating "this is bullsh**"

## 2020-04-10 NOTE — Discharge Instructions (Signed)
Your work-up today was overall reassuring and we did not find any evidence of a blood clot in your lungs, or abdomen.  Your work-up was also reassuring and similar compared to prior.  Your abdominal pain may be related to the recent nausea, vomiting, diarrhea you had after your trip however, we did not find any concerning abnormalities that would require admission at this time.  Please rest and stay hydrated and use your home pain and nausea medicine.  Please follow-up with your primary doctor.  If any symptoms change or worsen, please return to the nearest emergency department.

## 2020-05-16 ENCOUNTER — Ambulatory Visit (INDEPENDENT_AMBULATORY_CARE_PROVIDER_SITE_OTHER): Payer: No Typology Code available for payment source | Admitting: Cardiovascular Disease

## 2020-05-16 ENCOUNTER — Encounter: Payer: Self-pay | Admitting: Cardiovascular Disease

## 2020-05-16 ENCOUNTER — Other Ambulatory Visit: Payer: Self-pay

## 2020-05-16 VITALS — BP 135/75 | HR 62 | Ht 73.0 in | Wt 196.6 lb

## 2020-05-16 DIAGNOSIS — I87009 Postthrombotic syndrome without complications of unspecified extremity: Secondary | ICD-10-CM | POA: Diagnosis not present

## 2020-05-16 DIAGNOSIS — J189 Pneumonia, unspecified organism: Secondary | ICD-10-CM

## 2020-05-16 DIAGNOSIS — Z86711 Personal history of pulmonary embolism: Secondary | ICD-10-CM | POA: Diagnosis not present

## 2020-05-16 DIAGNOSIS — I5022 Chronic systolic (congestive) heart failure: Secondary | ICD-10-CM

## 2020-05-16 DIAGNOSIS — Q2112 Patent foramen ovale: Secondary | ICD-10-CM

## 2020-05-16 DIAGNOSIS — Q211 Atrial septal defect: Secondary | ICD-10-CM | POA: Diagnosis not present

## 2020-05-16 DIAGNOSIS — N280 Ischemia and infarction of kidney: Secondary | ICD-10-CM

## 2020-05-16 DIAGNOSIS — I82512 Chronic embolism and thrombosis of left femoral vein: Secondary | ICD-10-CM | POA: Diagnosis not present

## 2020-05-16 DIAGNOSIS — I483 Typical atrial flutter: Secondary | ICD-10-CM

## 2020-05-16 DIAGNOSIS — N1831 Chronic kidney disease, stage 3a: Secondary | ICD-10-CM

## 2020-05-16 NOTE — Progress Notes (Signed)
Cardiology Office Note:    Date:  05/16/2020   ID:  Cody Joyce, DOB 1960/10/22, MRN [id number]  PCP:  [facility]  [facility] Cardiologist:  Sanda Klein, MD  [facility] Electrophysiologist:  None   Referring MD: [facility]   CC: hospital follow up  History of Present Illness:    Cody Joyce is a 59 y.o. 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   In July, he was hospitalized and treated for hypoxia while traveling to [facility].  His records states that he required 4 L nasal cannula oxygen because he would desaturate to the low 80% range on room air.  CT of the chest did not show evidence of new pulmonary embolism, but showed "groundglass" opacities in the lower lobes. He reportedly had 3 negative Covid test (see documentation of at least 1 of these).  He was treated for Covid nonetheless.  An extensive panel of respiratory pathogen test was also negative (including Legionella, RSV, flu etc).  During that same hospitalization his white blood cell count was elevated at 17K.  Procalcitonin was elevated of 0.2 and CRP was markedly elevated at 29.  His renal function was markedly improved from the values we have recorded in [place] with a creatinine of 1.2.  Following that hospitalization he had some troubles with epigastric discomfort and small amounts of hematochezia.  He went to the emergency room in [place] after returning from [place] with abdominal pain.  He had a CT of the chest and abdomen/pelvis that showed normal findings.  Creatinine was elevated at 1.8.  He was discharged from the emergency room and has been feeling well since then.  Although he has to occasionally stop and catch his breath,  he is able to perform more more intense physical activity, including mowing the lawn with a push mower.  He has not had chest pain, palpitations or syncope.  He has not had any GI bleeding or other bleeding complications and denies falls or serious injuries.  His left leg is often uncomfortable, although this is variable from day-to-day.  The discomfort is relieved by elevating the limb.  He has not had problems with swelling as long as he wears his compression stockings.  He would like to return to work (he is self-employed).  L/R cath on 01/02/2020 showed normal coronary arteries. TEE during hospitalization on 01/03/2020 showed EF of 30-35% with moderated reduced left ventricular function with global hypokinesis. The right ventricle function was severely reduced with severe enlargement. Biatrial enlargement noted. AV and MV without stenosis or regurg. Agitated bubble study with findings of large PFO with bidirectional shunting.   Repeat outpatient TTE on 03/12/2020 showed improvement in EF to 40-45% with continued left ventricular dysfunction and global hypokinesis. Right ventricular systolic function has improved to normal. MV appears normal. Left venous doppler obtained on the same day shows chronic left popliteal and common femoral DVTs.   Follow up with Dr. Burt Knack on 01/25/2020. No immediate plan for PFO closure as patient is on anti-coagulation now.   Past Medical History:  Diagnosis Date  . Acute deep vein thrombosis (DVT) of left lower extremity ([place]) 01/03/2020  . Acute pulmonary embolism with acute cor pulmonale ([place]) 12/30/2019  .  Acute systolic CHF (congestive heart failure) ([place]) 01/03/2020  . Alcohol dependence ([place]) 07/25/2018  . Aortic atherosclerosis ([place]) 08/09/2018  . COPD (chronic obstructive pulmonary disease) ([place]) 07/25/2018  . Emphysema of lung ([place])   . 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 We will we ultrasound at followup the   . Mediastinal adenopathy  07/25/2018  . Multiple lung nodules on CT 07/25/2018  . Non-STEMI (non-ST elevated myocardial infarction) ([place]) 01/03/2020  . Osteoarthritis of left knee 05/30/2013   Tricompartmental disease Free-floating mass mostly in the superior lateral patellar compartment.   [name] PFO (patent foramen ovale) 01/03/2020  . Pulmonary embolism ([place]) 12/29/2019  . Renal infarct [facility])     Past Surgical History:  Procedure Laterality Date  . BUBBLE STUDY  01/03/2020   Procedure: BUBBLE STUDY;  Surgeon: [name], MD;  Location: [place];  Service: Cardiovascular;;  . IR ANGIOGRAM PULMONARY BILATERAL SELECTIVE  12/30/2019  . IR ANGIOGRAM SELECTIVE EACH ADDITIONAL VESSEL  12/30/2019  . IR ANGIOGRAM SELECTIVE EACH ADDITIONAL VESSEL  12/30/2019  . IR THROMBECT PRIM MECH ADD (INCLU) MOD SED  12/30/2019  . IR THROMBECT PRIM MECH INIT (INCLU) MOD SED  12/30/2019  . IR US GUIDE VASC ACCESS RIGHT  12/30/2019  . RIGHT/LEFT HEART CATH AND CORONARY ANGIOGRAPHY N/A 01/02/2020   Procedure: RIGHT/LEFT HEART CATH AND CORONARY ANGIOGRAPHY;  Surgeon: [name], MD;  Location: [facility];  Service: Cardiovascular;  Laterality: N/A;  . TEE WITHOUT CARDIOVERSION N/A 01/03/2020   Procedure: TRANSESOPHAGEAL ECHOCARDIOGRAM (TEE)   DEFINITY ;  Surgeon: [name], MD;  Location: [facility] ENDOSCOPY;  Service: Cardiovascular;  Laterality: N/A;    Current Medications: Current Meds  Medication Sig  . acetaminophen (TYLENOL) 500 MG tablet Take 1,000 mg by mouth every 6 (six) hours as needed for headache (pain).  [name] albuterol (VENTOLIN HFA) 108 (90 Base) MCG/ACT inhaler Inhale 2 puffs into the lungs 4 (four) times daily.  [name] apixaban (ELIQUIS) 5 MG TABS tablet Take 1 tablet (5 mg total) by mouth 2 (two) times daily. **start this after completing Eliquis Starter Pack**  . APIXABAN (ELIQUIS) VTE STARTER PACK (10MG  AND 5MG ) Take as directed on package: start with two-5mg  tablets twice daily for 7 days. On day 8, switch to  one-5mg  tablet twice daily.  . diclofenac Sodium (VOLTAREN) 1 % GEL Apply 1 application topically 3 (three) times daily as needed (knee pain).  . furosemide (LASIX) 40 MG tablet Take 1/2 tablet 20 mg daily  . LIDOCAINE EX Apply 1 application topically daily as needed (pain).  . metoprolol succinate (TOPROL-XL) 25 MG 24 hr tablet Take 3 tablets (75 mg total) by mouth daily.  . Multiple Vitamin (MULTIVITAMIN WITH MINERALS) TABS tablet Take 1 tablet by mouth daily.  [name] omeprazole (PRILOSEC) 20 MG capsule Take 20 mg by mouth 2 (two) times daily before a meal.  . potassium chloride (KLOR-CON) 10 MEQ tablet Take 10 mEq by mouth daily.     Allergies:   Patient has no known allergies.   Social History   Socioeconomic History  . Marital status: Married    Spouse name: Not on file  . Number of children: Not on file  . Years of education: Not on file  . Highest education level: Not on file  Occupational History  . Not on file  Tobacco Use  . Smoking status: Light Tobacco Smoker  . Smokeless tobacco: Never Used  Vaping Use  . Vaping Use: Never used  Substance and Sexual Activity  . Alcohol use: Yes    Alcohol/week: 6.0 standard drinks    Types: 2 Glasses of wine, 4 Cans of beer per week  . Drug use: No  . Sexual activity: Yes  Other Topics Concern  . Not on file  Social History Narrative   Marital status:  Married x 3 years; not happily married.      Children:  None      Lives: alone.      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      Tobacco: smoke cigars 2 per day.  Quit cigarettes in 2001; smoked x 15 years.      Alcohol:  Weekends 4 beers per week; 2 drinks per week.      Drugs:  None      Exercising:  Three days per week; active job.        Seatbelt:  100%      Guns:  None      Sexual activity: condoms; Gonorrhea in high school.  One partner in past year.  Last STD screening 2013.   Social Determinants of Health   Financial Resource Strain:   . Difficulty  of Paying Living Expenses: Not on file  Food Insecurity:   . Worried About Charity fundraiser in the Last Year: Not on file  . Ran Out of Food in the Last Year: Not on file  Transportation Needs:   . Lack of Transportation (Medical): Not on file  . Lack of Transportation (Non-Medical): Not on file  Physical Activity:   . Days of Exercise per Week: Not on file  . Minutes of Exercise per Session: Not on file  Stress:   . Feeling of Stress : Not on file  Social Connections:   . Frequency of Communication with Friends and Family: Not on file  . Frequency of Social Gatherings with Friends and Family: Not on file  . Attends Religious Services: Not on file  . Active Member of Clubs or Organizations: Not on file  . Attends Archivist Meetings: Not on file  . Marital Status: Not on file    Family History: The patient's *family history includes Glaucoma in his father and mother; Heart disease in his father.  ROS:   Please see the history of present illness.    All other systems reviewed and are negative.  EKGs/Labs/Other Studies Reviewed:    The following studies were reviewed today:  TEE (01/03/2020)  1. There is a smoke in the left ventricular apex with no evidence for a  thrombus on Definity echo contrast images.. Left ventricular ejection  fraction, by estimation, is 30 to 35%. The left ventricle has moderately decreased function. The left ventricle demonstrates global hypokinesis. There is the interventricular septum is flattened in systole and diastole, consistent with right ventricular pressure and volume overload.  2. Right ventricular systolic function is severely reduced. The right  ventricular size is severely enlarged. There is normal pulmonary artery systolic pressure.  3. Left atrial size was moderately dilated. No left atrial/left atrial appendage thrombus was detected.  4. Right atrial size was moderately dilated.  5. The mitral valve is normal in structure.  Mild mitral valve regurgitation. No evidence of mitral stenosis.  6. The aortic valve is normal in structure. Aortic valve regurgitation is not visualized. No aortic stenosis is present.  7. The inferior vena cava is normal in size with greater than 50% respiratory variability, suggesting right atrial  pressure of 3 mmHg.  8. Agitated saline contrast bubble study was significantly positive with shunting observed within the first cardiac cycles suggestive of  interatrial shunt. There is a large patent foramen ovale with bidirectional shunting across atrial septum.   Conclusion(s)/Recommendation(s): Findings are concerning for an  interatrial shunt as detailed above.   TTE (03/12/2020)  1. Left ventricular ejection fraction, by estimation, is 40 to 45%. The  left ventricle has mildly decreased function. The left ventricle demonstrates global hypokinesis. Left ventricular diastolic parameters are indeterminate. The average left ventricular global longitudinal strain is -14.0 %.  2. Right ventricular systolic function is normal. The right ventricular size is normal. There is normal pulmonary artery systolic pressure.  3. Left atrial size was mildly dilated.  4. The mitral valve is normal in structure. Trivial mitral valve regurgitation.  5. The aortic valve is tricuspid. Aortic valve regurgitation is not visualized. No aortic stenosis is present.  6. The inferior vena cava is normal in size with greater than 50% respiratory variability, suggesting right atrial pressure of 3 mmHg.   Conclusion(s)/Recommendation(s): EF reduced, though visually and by  measurement appears slightly improved compared to prior. RV appears  normal, normal RVSP. Mitral valve appears normal, previously noted as  myxomatous. Strain mildly and diffusely abnormal  throughout LV.   EKG:  EKG is ordered today. Possible left atrial abnormality and T wave inversion in leads III and aVF.  It is quite similar to the ECG from  August 5.  Recent Labs: 01/02/2020: TSH 4.168 01/09/2020: B Natriuretic Peptide 1,504.1 01/11/2020: Magnesium 1.7 04/09/2020: ALT 23; BUN 35; Creatinine, Ser 1.88; Hemoglobin 15.2; Platelets 322; Potassium 5.1; Sodium 139   Recent Lipid Panel No results found for: CHOL, HDL, LDLCALC, LDLDIRECT, TRIG, CHOLHDL  Physical Exam:    VS:  BP 135/75   Pulse 62   Ht 6\' 1"  (1.854 m)   Wt 196 lb 9.6 oz (89.2 kg)   SpO2 97%   BMI 25.94 kg/m     Wt Readings from Last 3 Encounters:  05/16/20 196 lb 9.6 oz (89.2 kg)  03/20/20 188 lb 9.6 oz (85.5 kg)  01/25/20 178 lb 6.4 oz (80.9 kg)    GEN: Well nourished, well developed in no acute distress HEENT: Normal NECK: No JVD; No carotid bruits LYMPHATICS: No lymphadenopathy CARDIAC: RRR, no murmurs, rubs, gallops RESPIRATORY:  Clear to auscultation without rales, wheezing or rhonchi  ABDOMEN: Soft, non-tender, non-distended MUSCULOSKELETAL:  No edema; No deformity  SKIN: Warm and dry NEUROLOGIC:  Alert and oriented x 3 PSYCHIATRIC:  Normal affect   ASSESSMENT:    1. Chronic systolic heart failure ([place])   2. History of pulmonary embolism   3. Chronic deep vein thrombosis (DVT) of femoral vein of left lower extremity (HCC)   4. Postphlebitic syndrome   5. Pneumonia of both lower lobes due to infectious organism   6. PFO (patent foramen ovale)   7. Typical atrial flutter ([place])   8. Renal infarct ([place])   9. Stage 3a chronic kidney disease    PLAN:    In order of problems listed above:  1. HFrEF: The etiology of his nonischemic cardiomyopathy remains unclear.  His LVEF appears to show an improving trend.  He is currently not receiving any RAAS inhibitors but is on a low-dose of metoprolol and furosemide.  We will recheck LVEF with a limited echo.  We will also recheck renal function.  If his creatinine is indeed back to near normal range at 1.2,  will try to initiate Entresto or ARB.  If his creatinine is still elevated, consider BiDil or  equivalent hydralazine/nitrate dosage.  Hopefully we will see that LVEF has recovered. 2. Submassive PE with chronic DVT: Compliant with anticoagulation without serious bleeding problems, although he has some transient hematochezia.  Unprovoked PE with apparent chronic DVT. At this time, patient will require at least 12 months of Eliquis. After that, will need to have a shared-decision making conversation on lifelong anticoagulation. He is tolerating Eliquis well without any evidence of bleeding. No medication changes made today to Eliquis.  3. Postphlebitic syndrome: He has recurrent problems with pain in his left lower extremity despite wearing compression stockings.  The discomfort resolves if he elevates the leg quite promptly.  No evidence of skin lesions or trophic changes.  Really no swelling today while wearing compression stockings. 4. Recent pneumonia: His hospitalization in [place] was consistent with an acute infectious pneumonia (elevated white blood cell count however inflammatory markers, changes on chest CT, hypoxia), but has resolved.  Extensive testing for infectious pathogens was unrevealing, including several negative Covid tests.  It is quite possible that his strikingly severe hypoxia was related to recurrence of his right to left shunt during acute illness. 5. PFO/ASD: We will reevaluate with a saline contrast study at the time of his limited echo for LV systolic function.  Today oxygen saturation is 97% on room air. 6. Atrial Flutter: Likely triggered by significant right heart strain from PE and newly discovered HFrEF.  No recurrence off antiarrhythmics for several months.  If it does recur he may require lifelong anticoagulation.  [id number] 3 (embolic event, LV dysfunction) 7. 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  We will recheck before deciding on additional treatment for his  cardiomyopathy.    Sanda Klein, MD, [place] [phone] 05/16/2020, 2:51 PM   Medication Adjustments/Labs and Tests Ordered: Current medicines are reviewed at length with the patient today.  Concerns regarding medicines are outlined above.   Orders Placed This Encounter  Procedures  . Basic metabolic panel  . Brain natriuretic peptide  . EKG 12-Lead  . ECHOCARDIOGRAM LIMITED BUBBLE STUDY   No orders of the defined types were placed in this encounter.   Patient Instructions  Medication Instructions:  No medications *If you need a refill on your cardiac medications before your next appointment, please call your pharmacy*   Lab Work: Your provider would like for you to have the following labs today: BMET and BNP  If you have labs (blood work) drawn today and your tests are completely normal, you will receive your results only by: [name] MyChart Message (if you have MyChart) OR . A paper copy in the mail If you have any lab test that is abnormal or we need to change your treatment, we will call you to review the results.   Testing/Procedures: Your physician has requested that you have a limited bubble echocardiogram. Echocardiography is a painless test that uses sound waves to create images of your heart. It provides your doctor with information about the size and shape of your heart and how well your heart's chambers and valves are working. You may receive an ultrasound enhancing agent through an IV if needed to better visualize your heart during the echo.This procedure takes approximately one hour. There are no restrictions for this procedure.    Follow-Up: At [facility], you and your  health needs are our priority.  As part of our continuing mission to provide you with exceptional heart care, we have created designated Provider Care Teams.  These Care Teams include your primary Cardiologist (physician) and Advanced Practice Providers (APPs -  Physician  Assistants and Nurse Practitioners) who all work together to provide you with the care you need, when you need it.  We recommend signing up for the patient portal called "MyChart".  Sign up information is provided on this After Visit Summary.  MyChart is used to connect with patients for Virtual Visits (Telemedicine).  Patients are able to view lab/test results, encounter notes, upcoming appointments, etc.  Non-urgent messages can be sent to your provider as well.   To learn more about what you can do with MyChart, go to [email or web address].    Your next appointment:   6 month(s)  The format for your next appointment:   In Person  Provider:   You may see Sanda Klein, MD or one of the following Advanced Practice Providers on your designated Care Team:    Almyra Deforest, PA-C  Fabian Sharp, [place] or   Roby Lofts, [place]     Signed, Dr. Jose Persia Internal Medicine PGY-2 05/16/2020, 2:51 PM

## 2020-05-16 NOTE — Patient Instructions (Signed)
Medication Instructions:  No medications *If you need a refill on your cardiac medications before your next appointment, please call your pharmacy*   Lab Work: Your provider would like for you to have the following labs today: BMET and BNP  If you have labs (blood work) drawn today and your tests are completely normal, you will receive your results only by: [name] MyChart Message (if you have MyChart) OR . A paper copy in the mail If you have any lab test that is abnormal or we need to change your treatment, we will call you to review the results.   Testing/Procedures: Your physician has requested that you have a limited bubble echocardiogram. Echocardiography is a painless test that uses sound waves to create images of your heart. It provides your doctor with information about the size and shape of your heart and how well your heart's chambers and valves are working. You may receive an ultrasound enhancing agent through an IV if needed to better visualize your heart during the echo.This procedure takes approximately one hour. There are no restrictions for this procedure.    Follow-Up: At [facility], you and your health needs are our priority.  As part of our continuing mission to provide you with exceptional heart care, we have created designated Provider Care Teams.  These Care Teams include your primary Cardiologist (physician) and Advanced Practice Providers (APPs -  Physician Assistants and Nurse Practitioners) who all work together to provide you with the care you need, when you need it.  We recommend signing up for the patient portal called "MyChart".  Sign up information is provided on this After Visit Summary.  MyChart is used to connect with patients for Virtual Visits (Telemedicine).  Patients are able to view lab/test results, encounter notes, upcoming appointments, etc.  Non-urgent messages can be sent to your provider as well.   To learn more about what you can do with MyChart, go  to [email or web address].    Your next appointment:   6 month(s)  The format for your next appointment:   In Person  Provider:   You may see Sanda Klein, MD or one of the following Advanced Practice Providers on your designated Care Team:    Almyra Deforest, PA-C  Fabian Sharp, PA-C or   Roby Lofts, [place]

## 2020-05-17 LAB — BRAIN NATRIURETIC PEPTIDE: BNP: 163.8 pg/mL — ABNORMAL HIGH (ref 0.0–100.0)

## 2020-05-17 LAB — BASIC METABOLIC PANEL
BUN/Creatinine Ratio: 10 (ref 10–24)
BUN: 15 mg/dL (ref 8–27)
CO2: 19 mmol/L — ABNORMAL LOW (ref 20–29)
Calcium: 8.8 mg/dL (ref 8.6–10.2)
Chloride: 105 mmol/L (ref 96–106)
Creatinine, Ser: 1.55 mg/dL — ABNORMAL HIGH (ref 0.76–1.27)
GFR calc Af Amer: 55 mL/min/{1.73_m2} — ABNORMAL LOW (ref >59)
GFR calc non Af Amer: 48 mL/min/{1.73_m2} — ABNORMAL LOW (ref >59)
Glucose: 105 mg/dL — ABNORMAL HIGH (ref 65–99)
Potassium: 4.2 mmol/L (ref 3.5–5.2)
Sodium: 139 mmol/L (ref 134–144)

## 2020-05-20 ENCOUNTER — Encounter: Payer: Self-pay | Admitting: *Deleted

## 2020-05-28 ENCOUNTER — Ambulatory Visit (HOSPITAL_COMMUNITY): Payer: No Typology Code available for payment source | Attending: Cardiology

## 2020-05-28 ENCOUNTER — Other Ambulatory Visit: Payer: Self-pay

## 2020-05-28 DIAGNOSIS — Q211 Atrial septal defect: Secondary | ICD-10-CM | POA: Diagnosis present

## 2020-05-28 DIAGNOSIS — Q2112 Patent foramen ovale: Secondary | ICD-10-CM

## 2020-05-28 DIAGNOSIS — I5022 Chronic systolic (congestive) heart failure: Secondary | ICD-10-CM | POA: Insufficient documentation

## 2020-05-28 LAB — ECHOCARDIOGRAM LIMITED BUBBLE STUDY
Area-P 1/2: 1.93 cm2
S' Lateral: 3.8 cm

## 2020-05-28 MED ORDER — SODIUM CHLORIDE 0.9% FLUSH
10.0000 mL | INTRAVENOUS | Status: DC | PRN
  Administered 2020-05-28: 20 mL via INTRAVENOUS

## 2020-05-30 ENCOUNTER — Other Ambulatory Visit: Payer: Self-pay | Admitting: *Deleted

## 2020-05-30 MED ORDER — LOSARTAN POTASSIUM 25 MG PO TABS: 25.0000 mg | ORAL_TABLET | Freq: Every day | ORAL | 3 refills | Status: DC

## 2020-06-13 ENCOUNTER — Telehealth: Payer: Self-pay | Admitting: Cardiovascular Disease

## 2020-06-13 NOTE — Telephone Encounter (Signed)
Spoke to patient advised Losartan is a B/P medication.Stated he will start taking as prescribed.

## 2020-06-13 NOTE — Telephone Encounter (Signed)
Pt c/o medication issue:  1. Name of Medication: losartan (COZAAR) 25 MG tablet  2. How are you currently taking this medication (dosage and times per day)? Patient just got it in the mail yesterday  3. Are you having a reaction (difficulty breathing--STAT)? n/a  4. What is your medication issue? Patient just got this new medication in the mail. He is not sure what it is for and wanted to hear from Dr. Loletha Grayer before he starts taking the medication

## 2020-07-02 ENCOUNTER — Ambulatory Visit: Payer: No Typology Code available for payment source | Admitting: Physician Assistant

## 2020-10-28 ENCOUNTER — Telehealth: Payer: Self-pay | Admitting: Cardiovascular Disease

## 2020-10-28 ENCOUNTER — Telehealth: Payer: Self-pay

## 2020-10-28 NOTE — Telephone Encounter (Signed)
Pt called to discuss disability with his MD. He said they spoke about it when pt was in hospital and he would like to have further conversation. I let pt know he has an appt in a few weeks in the office with MD and he said he would discuss it at that time. No further questions/concerns at this time.

## 2020-10-28 NOTE — Telephone Encounter (Signed)
°  Patient wanted to continue discuss with Dr. Burt Knack in regards to filing paperwork for Disability.   The patient has not started the paperwork yet. It was discussed with Dr. Burt Knack last. The patient would prefer to speak to him or his Nurse.

## 2020-11-21 ENCOUNTER — Other Ambulatory Visit: Payer: Self-pay

## 2020-11-21 ENCOUNTER — Ambulatory Visit (INDEPENDENT_AMBULATORY_CARE_PROVIDER_SITE_OTHER): Payer: No Typology Code available for payment source | Admitting: Cardiovascular Disease

## 2020-11-21 ENCOUNTER — Encounter: Payer: Self-pay | Admitting: Cardiovascular Disease

## 2020-11-21 VITALS — BP 138/80 | HR 51 | Ht 72.0 in | Wt 196.0 lb

## 2020-11-21 DIAGNOSIS — Q211 Atrial septal defect: Secondary | ICD-10-CM | POA: Diagnosis not present

## 2020-11-21 DIAGNOSIS — I87009 Postthrombotic syndrome without complications of unspecified extremity: Secondary | ICD-10-CM

## 2020-11-21 DIAGNOSIS — N1831 Chronic kidney disease, stage 3a: Secondary | ICD-10-CM

## 2020-11-21 DIAGNOSIS — I483 Typical atrial flutter: Secondary | ICD-10-CM

## 2020-11-21 DIAGNOSIS — Q2112 Patent foramen ovale: Secondary | ICD-10-CM

## 2020-11-21 DIAGNOSIS — Z86711 Personal history of pulmonary embolism: Secondary | ICD-10-CM | POA: Diagnosis not present

## 2020-11-21 DIAGNOSIS — I5022 Chronic systolic (congestive) heart failure: Secondary | ICD-10-CM | POA: Diagnosis not present

## 2020-11-21 MED ORDER — METOPROLOL SUCCINATE ER 50 MG PO TB24
50.0000 mg | ORAL_TABLET | Freq: Every day | ORAL | 5 refills | Status: DC
Start: 2020-11-21 — End: 2022-02-24

## 2020-11-21 MED ORDER — FUROSEMIDE 40 MG PO TABS
40.0000 mg | ORAL_TABLET | ORAL | 3 refills | Status: DC
Start: 2020-11-21 — End: 2021-07-17

## 2020-11-21 MED ORDER — APIXABAN 2.5 MG PO TABS: 2.5000 mg | ORAL_TABLET | Freq: Two times a day (BID) | ORAL | 1 refills | Status: DC

## 2020-11-21 MED ORDER — SACUBITRIL-VALSARTAN 24-26 MG PO TABS: 1.0000 | ORAL_TABLET | Freq: Two times a day (BID) | ORAL | 0 refills | Status: DC

## 2020-11-21 NOTE — Progress Notes (Signed)
Cardiology Office Note:    Date:  11/23/2020   ID:  Cody Joyce, DOB 1961/02/24, MRN [id number]  PCP:  Clinic, [facility]  [facility] Cardiologist:  Sanda Klein, MD  [place] Electrophysiologist:  None   Referring MD: Clinic, [facility]   CC: CHF. History of DVT/PE.  History of Present Illness:    Cody Joyce is a 60 y.o. 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   Although he has to occasionally stop and catch his breath, he is able to perform more more intense physical activity, including mowing the lawn with a push mower.  He has not had chest pain, palpitations or syncope.  He has not had any GI bleeding or other bleeding complications and denies falls or serious injuries.  His left leg is often uncomfortable, although this is variable from day-to-day.  The discomfort is relieved by elevating the limb.  He has not had problems with swelling as long as he wears his compression stockings.  He would like to return to work (he is self-employed).  L/R cath on 01/02/2020 showed normal coronary arteries. TEE during hospitalization on 01/03/2020 showed EF of 30-35% with moderated reduced left ventricular function with global hypokinesis. The right ventricle function was severely reduced with severe enlargement. Biatrial enlargement noted. AV and MV without stenosis or regurg. Agitated bubble study with findings of large PFO with bidirectional shunting.   Repeat outpatient TTE on 03/12/2020 showed improvement in EF to 40-45% with continued left ventricular dysfunction and global hypokinesis. Right ventricular systolic function has improved to normal. MV appears normal. Left venous doppler obtained on the same day shows chronic left popliteal and  common femoral DVTs.   In July 2021, he was hospitalized and treated for hypoxia while traveling to [facility].  His records states that he required 4 L nasal cannula oxygen because he would desaturate to the low 80% range on room air.  CT of the chest did not show evidence of new pulmonary embolism, but showed "groundglass" opacities in the lower lobes. He reportedly had 3 negative Covid test (see documentation of at least 1 of these).  He was treated for Covid nonetheless.  An extensive panel of respiratory pathogen test was also negative (including Legionella, RSV, flu etc).  During that same hospitalization his white blood cell count was elevated at 17K.  Procalcitonin was elevated of 0.2 and CRP was markedly elevated at 29.  His renal function was markedly improved from the values we have recorded in [place] with a creatinine of 1.2.  Following that hospitalization he had some troubles with epigastric discomfort and small amounts of hematochezia.  He went to the emergency room in [place] after returning from [place] with abdominal pain.  He had a CT of the chest and abdomen/pelvis that showed normal findings.  Creatinine was elevated at 1.8.  He was discharged from the emergency room and has been feeling well since then.  Another follow-up echocardiogram 05/28/2020 still showed LVEF 40-45% and there was no evidence of residual shunting across the atrial septum, but either color Doppler or saline contrast study.  Past Medical History:  Diagnosis Date  . Acute deep vein thrombosis (DVT) of left lower extremity ([place]) 01/03/2020  . Acute pulmonary embolism with  acute cor pulmonale ([place]) 12/30/2019  . Acute systolic CHF (congestive heart failure) ([place]) 01/03/2020  . Alcohol dependence ([place]) 07/25/2018  . Aortic atherosclerosis ([place]) 08/09/2018  . COPD (chronic obstructive pulmonary disease) ([place]) 07/25/2018  . Emphysema of lung ([place])   . 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  We will we ultrasound at followup the   . Mediastinal adenopathy 07/25/2018  . Multiple lung nodules on CT 07/25/2018  . Non-STEMI (non-ST elevated myocardial infarction) ([place]) 01/03/2020  . Osteoarthritis of left knee 05/30/2013   Tricompartmental disease Free-floating mass mostly in the superior lateral patellar compartment.   [name] PFO (patent foramen ovale) 01/03/2020  . Pulmonary embolism ([place]) 12/29/2019  . Renal infarct [facility])     Past Surgical History:  Procedure Laterality Date  . BUBBLE STUDY  01/03/2020   Procedure: BUBBLE STUDY;  Surgeon: [name], MD;  Location: [facility];  Service: Cardiovascular;;  . IR ANGIOGRAM PULMONARY BILATERAL SELECTIVE  12/30/2019  . IR ANGIOGRAM SELECTIVE EACH ADDITIONAL VESSEL  12/30/2019  . IR ANGIOGRAM SELECTIVE EACH ADDITIONAL VESSEL  12/30/2019  . IR THROMBECT PRIM MECH ADD (INCLU) MOD SED  12/30/2019  . IR THROMBECT PRIM MECH INIT (INCLU) MOD SED  12/30/2019  . IR US GUIDE VASC ACCESS RIGHT  12/30/2019  . RIGHT/LEFT HEART CATH AND CORONARY ANGIOGRAPHY N/A 01/02/2020   Procedure: RIGHT/LEFT HEART CATH AND CORONARY ANGIOGRAPHY;  Surgeon: [name], MD;  Location: [facility];  Service: Cardiovascular;  Laterality: N/A;  . TEE WITHOUT CARDIOVERSION N/A 01/03/2020   Procedure: TRANSESOPHAGEAL ECHOCARDIOGRAM (TEE)   DEFINITY ;  Surgeon: [name], MD;  Location: [facility] ENDOSCOPY;  Service: Cardiovascular;  Laterality: N/A;    Current Medications: Current Meds  Medication Sig  . acetaminophen (TYLENOL) 500 MG tablet Take 1,000 mg by mouth every 6 (six) hours as needed for headache (pain).  [name] albuterol (VENTOLIN HFA) 108 (90 Base) MCG/ACT inhaler Inhale 2 puffs into the lungs 4 (four) times daily.  . diclofenac Sodium (VOLTAREN) 1 % GEL Apply 1 application topically 3 (three) times daily as needed (knee pain).  [name] LIDOCAINE EX Apply 1 application topically daily as needed (pain).  . Multiple Vitamin (MULTIVITAMIN WITH MINERALS) TABS  tablet Take 1 tablet by mouth daily.  [name] omeprazole (PRILOSEC) 20 MG capsule Take 20 mg by mouth 2 (two) times daily before a meal.  . potassium chloride (KLOR-CON) 10 MEQ tablet Take 10 mEq by mouth daily.  . [DISCONTINUED] apixaban (ELIQUIS) 5 MG TABS tablet Take 1 tablet (5 mg total) by mouth 2 (two) times daily. **start this after completing Eliquis Starter Pack**  . [DISCONTINUED] APIXABAN (ELIQUIS) VTE STARTER PACK (10MG  AND 5MG ) Take as directed on package: start with two-5mg  tablets twice daily for 7 days. On day 8, switch to one-5mg  tablet twice daily.  . [DISCONTINUED] furosemide (LASIX) 40 MG tablet Take 1/2 tablet 20 mg daily  . [DISCONTINUED] metoprolol succinate (TOPROL-XL) 25 MG 24 hr tablet Take 3 tablets (75 mg total) by mouth daily.  . [DISCONTINUED] sacubitril-valsartan (ENTRESTO) 24-26 MG Take 1 tablet by mouth 2 (two) times daily.     Allergies:   Patient has no known allergies.   Social History   Socioeconomic History  . Marital status: Married    Spouse name: Not on file  . Number of children: Not on file  . Years of education: Not on file  . Highest education level: Not on file  Occupational History  . Not on  file  Tobacco Use  . Smoking status: Light Tobacco Smoker  . Smokeless tobacco: Never Used  Vaping Use  . Vaping Use: Never used  Substance and Sexual Activity  . Alcohol use: Yes    Alcohol/week: 6.0 standard drinks    Types: 2 Glasses of wine, 4 Cans of beer per week  . Drug use: No  . Sexual activity: Yes  Other Topics Concern  . Not on file  Social History Narrative   Marital status:  Married x 3 years; not happily married.      Children:  None      Lives: alone.      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      Tobacco: smoke cigars 2 per day.  Quit cigarettes in 2001; smoked x 15 years.      Alcohol:  Weekends 4 beers per week; 2 drinks per week.      Drugs:  None      Exercising:  Three days per week; active job.         Seatbelt:  100%      Guns:  None      Sexual activity: condoms; Gonorrhea in high school.  One partner in past year.  Last STD screening 2013.   Social Determinants of Health   Financial Resource Strain: Not on file  Food Insecurity: Not on file  Transportation Needs: Not on file  Physical Activity: Not on file  Stress: Not on file  Social Connections: Not on file    Family History: The patient's *family history includes Glaucoma in his father and mother; Heart disease in his father.  ROS:   Please see the history of present illness.    All other systems reviewed and are negative.  EKGs/Labs/Other Studies Reviewed:    The following studies were reviewed today:  TEE (01/03/2020)  1. There is a smoke in the left ventricular apex with no evidence for a  thrombus on Definity echo contrast images.. Left ventricular ejection  fraction, by estimation, is 30 to 35%. The left ventricle has moderately decreased function. The left ventricle demonstrates global hypokinesis. There is the interventricular septum is flattened in systole and diastole, consistent with right ventricular pressure and volume overload.  2. Right ventricular systolic function is severely reduced. The right  ventricular size is severely enlarged. There is normal pulmonary artery systolic pressure.  3. Left atrial size was moderately dilated. No left atrial/left atrial appendage thrombus was detected.  4. Right atrial size was moderately dilated.  5. The mitral valve is normal in structure. Mild mitral valve regurgitation. No evidence of mitral stenosis.  6. The aortic valve is normal in structure. Aortic valve regurgitation is not visualized. No aortic stenosis is present.  7. The inferior vena cava is normal in size with greater than 50% respiratory variability, suggesting right atrial pressure of 3 mmHg.  8. Agitated saline contrast bubble study was significantly positive with shunting observed within the first  cardiac cycles suggestive of  interatrial shunt. There is a large patent foramen ovale with bidirectional shunting across atrial septum.   Conclusion(s)/Recommendation(s): Findings are concerning for an  interatrial shunt as detailed above.   TTE (03/12/2020)  1. Left ventricular ejection fraction, by estimation, is 40 to 45%. The  left ventricle has mildly decreased function. The left ventricle demonstrates global hypokinesis. Left ventricular diastolic parameters are indeterminate. The average left ventricular global longitudinal strain is -14.0 %.  2. Right ventricular systolic function is  normal. The right ventricular size is normal. There is normal pulmonary artery systolic pressure.  3. Left atrial size was mildly dilated.  4. The mitral valve is normal in structure. Trivial mitral valve regurgitation.  5. The aortic valve is tricuspid. Aortic valve regurgitation is not visualized. No aortic stenosis is present.  6. The inferior vena cava is normal in size with greater than 50% respiratory variability, suggesting right atrial pressure of 3 mmHg.   Conclusion(s)/Recommendation(s): EF reduced, though visually and by  measurement appears slightly improved compared to prior. RV appears  normal, normal RVSP. Mitral valve appears normal, previously noted as  myxomatous. Strain mildly and diffusely abnormal  throughout LV.   Echo 05/28/2020 1. Left ventricular ejection fraction, by estimation, is 40 to 45%. The  left ventricle has mildly decreased function. The left ventricle  demonstrates global hypokinesis. Left ventricular diastolic parameters are  consistent with Grade I diastolic  dysfunction (impaired relaxation).  2. Right ventricular systolic function is normal. The right ventricular  size is normal. Tricuspid regurgitation signal is inadequate for assessing  PA pressure.  3. The mitral valve is normal in structure. No evidence of mitral valve  regurgitation. No evidence  of mitral stenosis.  4. The aortic valve is tricuspid. Aortic valve regurgitation is not  visualized. No aortic stenosis is present.  5. The inferior vena cava is normal in size with greater than 50%  respiratory variability, suggesting right atrial pressure of 3 mmHg.  6. Negative bubble study, no evidence for PFO or ASD.   EKG:  EKG is ordered today.  It shows normal sinus rhythm with a competing junctional rhythm and T wave inversion in the inferior leads and lead V6; the repolarization abnormalities are quite similar to previous tracings Possible left atrial abnormality and T wave inversion in leads III and aVF.  It is quite similar to the ECG from August 5.  Recent Labs: 01/02/2020: TSH 4.168 01/11/2020: Magnesium 1.7 04/09/2020: ALT 23; Hemoglobin 15.2; Platelets 322 05/16/2020: BNP 163.8; BUN 15; Creatinine, Ser 1.55; Potassium 4.2; Sodium 139   Recent Lipid Panel No results found for: CHOL, HDL, LDLCALC, LDLDIRECT, TRIG, CHOLHDL  Physical Exam:    VS:  BP 138/80   Pulse (!) 51   Ht 6' (1.829 m)   Wt 196 lb (88.9 kg)   SpO2 98%   BMI 26.58 kg/m     Wt Readings from Last 3 Encounters:  11/21/20 196 lb (88.9 kg)  05/16/20 196 lb 9.6 oz (89.2 kg)  03/20/20 188 lb 9.6 oz (85.5 kg)     General: Alert, oriented x3, no distress, appears well Head: no evidence of trauma, PERRL, EOMI, no exophtalmos or lid lag, no myxedema, no xanthelasma; normal ears, nose and oropharynx Neck: normal jugular venous pulsations and no hepatojugular reflux; brisk carotid pulses without delay and no carotid bruits Chest: clear to auscultation, no signs of consolidation by percussion or palpation, normal fremitus, symmetrical and full respiratory excursions Cardiovascular: normal position and quality of the apical impulse, regular rhythm, normal first and second heart sounds, no murmurs, rubs or gallops Abdomen: no tenderness or distention, no masses by palpation, no abnormal pulsatility or arterial  bruits, normal bowel sounds, no hepatosplenomegaly Extremities: no clubbing, cyanosis or edema; 2+ radial, ulnar and brachial pulses bilaterally; 2+ right femoral, posterior tibial and dorsalis pedis pulses; 2+ left femoral, posterior tibial and dorsalis pedis pulses; no subclavian or femoral bruits Neurological: grossly nonfocal Psych: Normal mood and affect   ASSESSMENT:  1. Chronic systolic heart failure ([place])   2. History of pulmonary embolism   3. Postphlebitic syndrome   4. PFO (patent foramen ovale)   5. Typical atrial flutter (HCC)   6. Stage 3a chronic kidney disease (HCC)    PLAN:    In order of problems listed above:  1. HFrEF: The etiology of his nonischemic cardiomyopathy remains unclear, but the low ejection fraction has persisted outside periods of critical illness.  Initially, medical therapy was limited by his low blood pressure, probably due to the pulmonary embolism, but his blood pressure is now higher and should allow [place] to prescribe higher doses of medication.  He has tolerated losartan well.  Would like to try him on Entresto and will started at the lowest dose today.  Bring him back in a couple of weeks for repeat labs, with emphasis on evaluating his renal function, to see if we can further titrate the drug upwards.  His LVEF appears to show an improving trend.   2. Submassive PE with chronic DVT: It will soon be a year since his initial presentation with pulmonary embolism.  Unprovoked PE with apparent chronic DVT.  I think since his PE was unprovoked and life-threatening, we should keep him on preventive dose oral anticoagulant lifelong.. 3. Postphlebitic syndrome: He continues to have mild swelling and discomfort in his left lower leg if he stands up for long periods of time, but compression stockings are helping.  He has evidence of chronic DVT. 4. PFO/ASD: No shunting was seen on his most recent echocardiogram, either by Doppler or saline contrast  injection. 5. Atrial Flutter: This was likely triggered by right heart strain during pulmonary embolism and has not recurred.  [id number] 3 (embolic event, LV dysfunction), but unless the arrhythmia recurs, we will keep him on prophylactic dose anticoagulation only. 6. Junctional rhythm: reduce metoprolol to 50 mg daily. 7. 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  Recheck after couple of weeks on Entresto.    Sanda Klein, MD, [place] [phone] 11/23/2020, 7:26 PM   Medication Adjustments/Labs and Tests Ordered: Current medicines are reviewed at length with the patient today.  Concerns regarding medicines are outlined above.   Orders Placed This Encounter  Procedures  . EKG 12-Lead   Meds ordered this encounter  Medications  . furosemide (LASIX) 40 MG tablet    Sig: Take 1 tablet (40 mg total) by mouth every other day.    Dispense:  15 tablet    Refill:  3  . DISCONTD: sacubitril-valsartan (ENTRESTO) 24-26 MG    Sig: Take 1 tablet by mouth 2 (two) times daily.    Dispense:  60 tablet    Refill:  0  . metoprolol succinate (TOPROL-XL) 50 MG 24 hr tablet    Sig: Take 1 tablet (50 mg total) by mouth daily.    Dispense:  30 tablet    Refill:  5  . DISCONTD: apixaban (ELIQUIS) 2.5 MG TABS tablet    Sig: Take 1 tablet (2.5 mg total) by mouth 2 (two) times daily.    Dispense:  180 tablet    Refill:  1  . sacubitril-valsartan (ENTRESTO) 24-26 MG    Sig: Take 1 tablet by mouth 2 (two) times daily.    Dispense:  60 tablet    Refill:  0  . apixaban (ELIQUIS) 2.5 MG TABS tablet  Sig: Take 1 tablet (2.5 mg total) by mouth 2 (two) times daily.    Dispense:  180 tablet    Refill:  1    Patient Instructions  Medication Instructions:  STOP the Losartan  DECREASE the Metoprolol Succinate to 50 mg once daily DECREASE the  Eliquis to 2.5 mg twice daily  START Entresto 24-26 mg twice daily   *If you need a refill on your cardiac medications before your next appointment, please call your pharmacy*   Lab Work: None ordered If you have labs (blood work) drawn today and your tests are completely normal, you will receive your results only by: [name] MyChart Message (if you have MyChart) OR . A paper copy in the mail If you have any lab test that is abnormal or we need to change your treatment, we will call you to review the results.   Testing/Procedures: None ordered   Follow-Up: At [facility], you and your health needs are our priority.  As part of our continuing mission to provide you with exceptional heart care, we have created designated Provider Care Teams.  These Care Teams include your primary Cardiologist (physician) and Advanced Practice Providers (APPs -  Physician Assistants and Nurse Practitioners) who all work together to provide you with the care you need, when you need it.  We recommend signing up for the patient portal called "MyChart".  Sign up information is provided on this After Visit Summary.  MyChart is used to connect with patients for Virtual Visits (Telemedicine).  Patients are able to view lab/test results, encounter notes, upcoming appointments, etc.  Non-urgent messages can be sent to your provider as well.   To learn more about what you can do with MyChart, go to [email or web address].    Your next appointment:   Follow up in 2 weeks with pharmD for medication titration Follow up in 3 months with Dr. Sallyanne Kuster    Signed, Dr. Jose Persia Internal Medicine PGY-2 11/23/2020, 7:26 PM

## 2020-11-21 NOTE — Patient Instructions (Signed)
Medication Instructions:  STOP the Losartan  DECREASE the Metoprolol Succinate to 50 mg once daily DECREASE the Eliquis to 2.5 mg twice daily  START Entresto 24-26 mg twice daily   *If you need a refill on your cardiac medications before your next appointment, please call your pharmacy*   Lab Work: None ordered If you have labs (blood work) drawn today and your tests are completely normal, you will receive your results only by: [name] MyChart Message (if you have MyChart) OR . A paper copy in the mail If you have any lab test that is abnormal or we need to change your treatment, we will call you to review the results.   Testing/Procedures: None ordered   Follow-Up: At [facility], you and your health needs are our priority.  As part of our continuing mission to provide you with exceptional heart care, we have created designated Provider Care Teams.  These Care Teams include your primary Cardiologist (physician) and Advanced Practice Providers (APPs -  Physician Assistants and Nurse Practitioners) who all work together to provide you with the care you need, when you need it.  We recommend signing up for the patient portal called "MyChart".  Sign up information is provided on this After Visit Summary.  MyChart is used to connect with patients for Virtual Visits (Telemedicine).  Patients are able to view lab/test results, encounter notes, upcoming appointments, etc.  Non-urgent messages can be sent to your provider as well.   To learn more about what you can do with MyChart, go to [email or web address].    Your next appointment:   Follow up in 2 weeks with pharmD for medication titration Follow up in 3 months with Dr. Sallyanne Kuster

## 2020-11-23 DIAGNOSIS — Z86711 Personal history of pulmonary embolism: Secondary | ICD-10-CM

## 2020-11-23 DIAGNOSIS — I87009 Postthrombotic syndrome without complications of unspecified extremity: Secondary | ICD-10-CM | POA: Insufficient documentation

## 2020-11-23 DIAGNOSIS — N1831 Chronic kidney disease, stage 3a: Secondary | ICD-10-CM | POA: Insufficient documentation

## 2020-11-23 DIAGNOSIS — I5022 Chronic systolic (congestive) heart failure: Secondary | ICD-10-CM | POA: Insufficient documentation

## 2020-11-23 HISTORY — DX: Personal history of pulmonary embolism: Z86.711

## 2020-12-08 ENCOUNTER — Ambulatory Visit: Payer: No Typology Code available for payment source

## 2020-12-08 NOTE — Progress Notes (Deleted)
Patient ID: Cody Joyce                 DOB: 1960-09-08                      MRN: [id number]     HPI: Cody Joyce is a 60 y.o. male referred by Dr. Sallyanne Kuster to pharmacy clinic for HF medication management. 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 EF improved to 40-45% on outpatient TTE 03/12/2020 and echo 05/28/20. He was last seen by Dr Sallyanne Kuster on 11/21/20. Losartan was changed to [facility] and pt was referred to PharmD clinic for further titration. SCr has been variable and has ranged from 1.2 - 1.88.  Needs bmet today Inc entresto if able Confirm pt stopped losartan Home bp? Swelling? Cut back on lasix/stop K? Make sure pt has copay card for entresto and eliquis - does he have commercial insurance? Can start sglt2i at f/u visit if renal fxn allows  Today she returns to pharmacy clinic for further medication titration. At last visit with MD ***. Symptomatically, she is feeling ***, *** dizziness, lightheadedness, and fatigue. *** chest pain or palpitations. Feels SOB when ***. Able to complete all ADLs. Activity level ***. She *** checks her weight at home (normal range *** - *** lbs). *** LEE, PND, or orthopnea. Appetite has been ***. She *** adheres to a low-salt diet.  Current CHF meds: Toprol 50mg  daily, Entresto 24-26mg  BID, furosemide 40mg  QOD, Klor Con 63meq daily  BP goal: <130/27mmHg   Family History: Glaucoma in his father and mother; Heart disease in his father.  Social History: smokes 2 cigars per day, quit cigarettes in 2001, had smoked for 15 years, ~1 alcoholic drink per day.  Diet:   Exercise:   Home BP readings:   Wt Readings from Last 3 Encounters:  11/21/20 196 lb (88.9 kg)  05/16/20 196 lb 9.6 oz (89.2 kg)  03/20/20 188 lb 9.6 oz (85.5 kg)   BP Readings  from Last 3 Encounters:  11/21/20 138/80  05/16/20 135/75  04/10/20 124/88   Pulse Readings from Last 3 Encounters:  11/21/20 (!) 51  05/16/20 62  04/10/20 67    Renal function: CrCl cannot be calculated (Patient's most recent lab result is older than the maximum 21 days allowed.).  Past Medical History:  Diagnosis Date  . Acute deep vein thrombosis (DVT) of left lower extremity ([place]) 01/03/2020  . Acute pulmonary embolism with acute cor pulmonale ([place]) 12/30/2019  . Acute systolic CHF (congestive heart failure) ([place]) 01/03/2020  . Alcohol dependence ([place]) 07/25/2018  . Aortic atherosclerosis ([place]) 08/09/2018  . COPD (chronic obstructive pulmonary disease) ([place]) 07/25/2018  . Emphysema of lung ([place])   . 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 We will we ultrasound at followup the   . Mediastinal adenopathy 07/25/2018  . Multiple lung nodules on CT 07/25/2018  . Non-STEMI (non-ST elevated myocardial infarction) ([place]) 01/03/2020  . Osteoarthritis of left knee 05/30/2013   Tricompartmental disease Free-floating mass mostly in the superior lateral patellar compartment.   [name] PFO (patent foramen ovale) 01/03/2020  . Pulmonary embolism ([place]) 12/29/2019  . Renal infarct [facility])     Current Outpatient Medications on File Prior to Visit  Medication Sig Dispense Refill  .  acetaminophen (TYLENOL) 500 MG tablet Take 1,000 mg by mouth every 6 (six) hours as needed for headache (pain).    [name] albuterol (VENTOLIN HFA) 108 (90 Base) MCG/ACT inhaler Inhale 2 puffs into the lungs 4 (four) times daily.    [name] apixaban (ELIQUIS) 2.5 MG TABS tablet Take 1 tablet (2.5 mg total) by mouth 2 (two) times daily. 180 tablet 1  . diclofenac Sodium (VOLTAREN) 1 % GEL Apply 1 application topically 3 (three) times daily as needed (knee pain).    . furosemide (LASIX) 40 MG tablet Take 1 tablet (40 mg total) by mouth every other day. 15 tablet 3  . LIDOCAINE EX Apply 1 application topically daily as  needed (pain).    . metoprolol succinate (TOPROL-XL) 50 MG 24 hr tablet Take 1 tablet (50 mg total) by mouth daily. 30 tablet 5  . Multiple Vitamin (MULTIVITAMIN WITH MINERALS) TABS tablet Take 1 tablet by mouth daily.    [name] omeprazole (PRILOSEC) 20 MG capsule Take 20 mg by mouth 2 (two) times daily before a meal.    . potassium chloride (KLOR-CON) 10 MEQ tablet Take 10 mEq by mouth daily.    . sacubitril-valsartan (ENTRESTO) 24-26 MG Take 1 tablet by mouth 2 (two) times daily. 60 tablet 0   Current Facility-Administered Medications on File Prior to Visit  Medication Dose Route Frequency Provider Last Rate Last Admin  . sodium chloride flush (NS) 0.9 % injection 10 mL  10 mL Intravenous PRN Croitoru, Mihai, MD   20 mL at 05/28/20 1540    No Known Allergies   Assessment/Plan:  1. CHF -

## 2020-12-12 ENCOUNTER — Ambulatory Visit (INDEPENDENT_AMBULATORY_CARE_PROVIDER_SITE_OTHER): Payer: No Typology Code available for payment source | Admitting: Pharmacist

## 2020-12-12 ENCOUNTER — Other Ambulatory Visit: Payer: Self-pay

## 2020-12-12 VITALS — BP 138/84 | HR 59

## 2020-12-12 DIAGNOSIS — I5021 Acute systolic (congestive) heart failure: Secondary | ICD-10-CM

## 2020-12-12 DIAGNOSIS — I5022 Chronic systolic (congestive) heart failure: Secondary | ICD-10-CM | POA: Diagnosis not present

## 2020-12-12 MED ORDER — EMPAGLIFLOZIN 10 MG PO TABS: 10.0000 mg | ORAL_TABLET | Freq: Every day | ORAL | 3 refills | Status: DC

## 2020-12-12 NOTE — Progress Notes (Signed)
Patient ID: [name]                 DOB: [birth year]                      MRN: [id number]     HPI: Cody Joyce is a 60 y.o. male referred by Dr. Sallyanne Joyce to pharmacy clinic for HF medication management. 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 EF improved to 40-45% on outpatient TTE 03/12/2020 and echo 05/28/20. He was last seen by Dr Cody Joyce on 11/21/20. Losartan was changed to [facility] and pt was referred to PharmD clinic for further titration. SCr has been variable and has ranged from 1.2 - 1.88.  Pt presents today in good spirits. His wife is on the phone listening to today's visit. He didn't receive his Entresto from the [place] until this week and took his first dose of Entresto yesterday. Tolerating therapy well so far, denies dizziness, headaches, LEE. [facility] Confirmed Delene Loll is on his formulary. Has a home BP cuff, hasn't been checking his readings lately though. Takes his Lasix every other day, denies swelling.  Current CHF meds: Toprol 50mg  daily, Entresto 24-26mg  BID, furosemide 40mg  QOD, Klor Con 7meq daily  BP goal: <130/76mmHg   Family History: Glaucoma in his father and mother; Heart disease in his father.  Social History: smokes 2 cigars per day, quit cigarettes in 2001, had smoked for 15 years, ~1 alcoholic drink per day.  Diet: Breakfast - Eggs or cereal and fruit. Lunch - grabs on the go, may have a burger. Dinner - eats at home.   Exercise: Cardio at home. Stays busy with work  Home BP readings: Has a cuff but hasn't checked recently.  Wt Readings from Last 3 Encounters:  11/21/20 196 lb (88.9 kg)  05/16/20 196 lb 9.6 oz (89.2 kg)  03/20/20 188 lb 9.6 oz (85.5 kg)   BP Readings from Last 3 Encounters:  11/21/20 138/80  05/16/20  135/75  04/10/20 124/88   Pulse Readings from Last 3 Encounters:  11/21/20 (!) 51  05/16/20 62  04/10/20 67    Renal function: CrCl cannot be calculated (Patient's most recent lab result is older than the maximum 21 days allowed.).  Past Medical History:  Diagnosis Date  . Acute deep vein thrombosis (DVT) of left lower extremity ([place]) 01/03/2020  . Acute pulmonary embolism with acute cor pulmonale ([place]) 12/30/2019  . Acute systolic CHF (congestive heart failure) ([place]) 01/03/2020  . Alcohol dependence ([place]) 07/25/2018  . Aortic atherosclerosis ([place]) 08/09/2018  . COPD (chronic obstructive pulmonary disease) ([place]) 07/25/2018  . Emphysema of lung ([place])   . 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 We will we ultrasound at followup the   . Mediastinal adenopathy 07/25/2018  . Multiple lung nodules on CT 07/25/2018  . Non-STEMI (non-ST elevated myocardial infarction) ([place]) 01/03/2020  . Osteoarthritis of left knee 05/30/2013   Tricompartmental disease Free-floating mass mostly in the superior lateral patellar compartment.   [name] PFO (patent foramen ovale) 01/03/2020  . Pulmonary embolism ([place]) 12/29/2019  . Renal infarct [facility])     Current Outpatient Medications on File Prior to Visit  Medication Sig Dispense Refill  . acetaminophen (TYLENOL) 500 MG tablet Take 1,000  mg by mouth every 6 (six) hours as needed for headache (pain).    [name] albuterol (VENTOLIN HFA) 108 (90 Base) MCG/ACT inhaler Inhale 2 puffs into the lungs 4 (four) times daily.    [name] apixaban (ELIQUIS) 2.5 MG TABS tablet Take 1 tablet (2.5 mg total) by mouth 2 (two) times daily. 180 tablet 1  . diclofenac Sodium (VOLTAREN) 1 % GEL Apply 1 application topically 3 (three) times daily as needed (knee pain).    . furosemide (LASIX) 40 MG tablet Take 1 tablet (40 mg total) by mouth every other day. 15 tablet 3  . LIDOCAINE EX Apply 1 application topically daily as needed (pain).    . metoprolol succinate (TOPROL-XL) 50  MG 24 hr tablet Take 1 tablet (50 mg total) by mouth daily. 30 tablet 5  . Multiple Vitamin (MULTIVITAMIN WITH MINERALS) TABS tablet Take 1 tablet by mouth daily.    [name] omeprazole (PRILOSEC) 20 MG capsule Take 20 mg by mouth 2 (two) times daily before a meal.    . potassium chloride (KLOR-CON) 10 MEQ tablet Take 10 mEq by mouth daily.    . sacubitril-valsartan (ENTRESTO) 24-26 MG Take 1 tablet by mouth 2 (two) times daily. 60 tablet 0   Current Facility-Administered Medications on File Prior to Visit  Medication Dose Route Frequency Provider Last Rate Last Admin  . sodium chloride flush (NS) 0.9 % injection 10 mL  10 mL Intravenous PRN Croitoru, Mihai, MD   20 mL at 05/28/20 1540    No Known Allergies   Assessment/Plan:  1. CHF with LVEF 40-45% - BP a bit elevated above goal <130/77mmHg with room to further optimize CHF meds. Since pt didn't start Entresto until yesterday due to delay in shipping med, will continue current dose 24-26mg  BID and recheck BMET in 1 week with goal to titrate dose as able (will schedule PharmD follow up at that time). Will continue Toprol 50mg  daily, HR in upper 50s today. Will also start Jardiance 10mg  daily for CHF benefit. Prior authorization submitted and approved through 12/12/21, called $10 copay card into pharmacy.  Wade Asebedo E. Aleksis Jiggetts, PharmD, BCACP, [place] [address], [place], [place] [address] Phone: [phone]; Fax: [phone] 12/12/2020 10:34 AM

## 2020-12-12 NOTE — Patient Instructions (Addendum)
Recheck lab work next Friday 4/15 any time after 7:30am  Continue taking your current medications  Keep an eye on your blood pressure and record your readings  I will also see if your insurance covers either [place] or [place] which will help your heart, and will give you a call when I hear back from your insurance

## 2020-12-18 ENCOUNTER — Encounter: Payer: Self-pay | Admitting: Nurse Practitioner

## 2020-12-19 ENCOUNTER — Other Ambulatory Visit: Payer: Self-pay

## 2020-12-19 ENCOUNTER — Telehealth: Payer: Self-pay | Admitting: Cardiovascular Disease

## 2020-12-19 ENCOUNTER — Other Ambulatory Visit: Payer: No Typology Code available for payment source | Admitting: *Deleted

## 2020-12-19 DIAGNOSIS — E875 Hyperkalemia: Secondary | ICD-10-CM

## 2020-12-19 DIAGNOSIS — I5022 Chronic systolic (congestive) heart failure: Secondary | ICD-10-CM

## 2020-12-19 DIAGNOSIS — Z79899 Other long term (current) drug therapy: Secondary | ICD-10-CM

## 2020-12-19 DIAGNOSIS — N1831 Chronic kidney disease, stage 3a: Secondary | ICD-10-CM

## 2020-12-19 LAB — BASIC METABOLIC PANEL
BUN/Creatinine Ratio: 12 (ref 10–24)
BUN: 18 mg/dL (ref 8–27)
CO2: 22 mmol/L (ref 20–29)
Calcium: 9.1 mg/dL (ref 8.6–10.2)
Chloride: 109 mmol/L — ABNORMAL HIGH (ref 96–106)
Creatinine, Ser: 1.5 mg/dL — ABNORMAL HIGH (ref 0.76–1.27)
Glucose: 85 mg/dL (ref 65–99)
Potassium: 6.1 mmol/L (ref 3.5–5.2)
Sodium: 141 mmol/L (ref 134–144)
eGFR: 53 mL/min/{1.73_m2} — ABNORMAL LOW (ref >59)

## 2020-12-19 NOTE — Telephone Encounter (Signed)
[facility] had a critical potassium reading for this patient

## 2020-12-19 NOTE — Telephone Encounter (Signed)
Spoke to [place] ( tech)  Potasium 6.1  Slightly  Hemolyzed   the full report faxed-  reviewed with Dr Audie Box results  Verbal order from Dr Audie Box hold Delene Loll until office contact after repeat [place]. Repeat BMP on Monday April 18,2022. Called patient wife left voice Fernande Boyden was not able to speak to patient.  Patient re  Called. spoke to patient - informed patient of the above  Verbalized understanding

## 2020-12-19 NOTE — Telephone Encounter (Signed)
Thanks

## 2020-12-19 NOTE — Addendum Note (Signed)
Addended by: Raiford Simmonds on: 12/19/2020 05:40 PM   Modules accepted: Orders

## 2020-12-22 ENCOUNTER — Other Ambulatory Visit: Payer: Self-pay

## 2020-12-22 ENCOUNTER — Other Ambulatory Visit: Payer: No Typology Code available for payment source | Admitting: *Deleted

## 2020-12-22 DIAGNOSIS — I5022 Chronic systolic (congestive) heart failure: Secondary | ICD-10-CM

## 2020-12-22 DIAGNOSIS — E875 Hyperkalemia: Secondary | ICD-10-CM

## 2020-12-22 DIAGNOSIS — N1831 Chronic kidney disease, stage 3a: Secondary | ICD-10-CM

## 2020-12-22 DIAGNOSIS — Z79899 Other long term (current) drug therapy: Secondary | ICD-10-CM

## 2020-12-22 LAB — BASIC METABOLIC PANEL
BUN/Creatinine Ratio: 13 (ref 10–24)
BUN: 22 mg/dL (ref 8–27)
CO2: 22 mmol/L (ref 20–29)
Calcium: 9.1 mg/dL (ref 8.6–10.2)
Chloride: 107 mmol/L — ABNORMAL HIGH (ref 96–106)
Creatinine, Ser: 1.7 mg/dL — ABNORMAL HIGH (ref 0.76–1.27)
Glucose: 87 mg/dL (ref 65–99)
Potassium: 4.6 mmol/L (ref 3.5–5.2)
Sodium: 142 mmol/L (ref 134–144)
eGFR: 46 mL/min/{1.73_m2} — ABNORMAL LOW (ref >59)

## 2020-12-23 NOTE — Telephone Encounter (Signed)
Please restart the Entresto and recheck BMET in 1-2 weeks. The sample with K 6.1 was hemolyzed.

## 2020-12-23 NOTE — Telephone Encounter (Signed)
Pt aware to resume low dose Entresto. Will also stop Klor Con 57meq daily. Recheck BMET and BP on 4/27 at [facility] office with PharmD.

## 2020-12-23 NOTE — Telephone Encounter (Signed)
K on recheck yesterday improved from 6.1 to 4.6 after holding Entresto over the weekend. Confirmed with pt that he had stopped losartan when  Entresto was started. Pt previously took losartan 06/13/20 - 11/21/20 but does not look as though he ever had BMET checked on therapy. Cody Joyce was initially prescribed at 11/21/20 MD visit however there was a delay in getting his med and he didn't start Entresto until 4/7. He has still been taking low dose potassium 51meq daily as well. He denies use of other OTC meds or Cody Joyce salt substitute.  Will forward to Dr Sallyanne Kuster regarding potential rechallenge with low dose Entresto; if so, would stop his potassium supplementation and closely monitor his K.

## 2020-12-23 NOTE — Addendum Note (Signed)
Addended by: Mirenda Baltazar E on: 12/23/2020 01:38 PM   Modules accepted: Orders

## 2020-12-31 ENCOUNTER — Ambulatory Visit: Payer: No Typology Code available for payment source

## 2020-12-31 ENCOUNTER — Telehealth: Payer: Self-pay | Admitting: Pharmacist

## 2020-12-31 NOTE — Progress Notes (Deleted)
Patient ID: Cody Joyce                 DOB: 01-23-1961                      MRN: [id number]     HPI: Cody Joyce is a 60 y.o. male referred by Dr. Sallyanne Kuster to pharmacy clinic for HF medication management. 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 EF improved to 40-45% on outpatient TTE 03/12/2020 and echo 05/28/20. He was last seen by Dr Sallyanne Kuster on 11/21/20. Losartan was changed to [facility] and pt was referred to PharmD clinic for further titration. SCr has been variable and has ranged from 1.2 - 1.88. At first visit with me, pt was started on Jardiance for CHF. Follow up BMET showed K up to 6.1 but sample was hemolyzed. He was advised to hold Entresto over the weekend. Follow up K was 4.6, pt was advised to resume Entresto and stop low dose K supplementation. He presents today for follow up.  Pt presents today in good spirits. His wife is on the phone listening to today's visit. He didn't receive his Entresto from the [place] until this week and took his first dose of Entresto yesterday. Tolerating therapy well so far, denies dizziness, headaches, LEE. [facility] Confirmed Delene Loll is on his formulary. Has a home BP cuff, hasn't been checking his readings lately though. Takes his Lasix every other day, denies swelling.  bmet today! Any swelling? Home bp readings? [facility]? Inc entresto or start spiro if able HR likely willy prevent toprol dose increase  Current CHF meds:  Toprol 50mg  daily Entresto 24-26mg  BID Jardiance 10mg  daily Furosemide 40mg  QOD  BP goal: <130/46mmHg   Family History: Glaucoma in his father and mother; Heart disease in his father.  Social History: smokes 2 cigars per day, quit cigarettes in 2001, had smoked for 15 years, ~1  alcoholic drink per day.  Diet: Breakfast - Eggs or cereal and fruit. Lunch - grabs on the go, may have a burger. Dinner - eats at home.   Exercise: Cardio at home. Stays busy with work  Home BP readings: Has a cuff but hasn't checked recently.  Wt Readings from Last 3 Encounters:  11/21/20 196 lb (88.9 kg)  05/16/20 196 lb 9.6 oz (89.2 kg)  03/20/20 188 lb 9.6 oz (85.5 kg)   BP Readings from Last 3 Encounters:  12/12/20 138/84  11/21/20 138/80  05/16/20 135/75   Pulse Readings from Last 3 Encounters:  12/12/20 (!) 59  11/21/20 (!) 51  05/16/20 62    Renal function: CrCl cannot be calculated (Unknown ideal weight.).  Past Medical History:  Diagnosis Date  . Acute deep vein thrombosis (DVT) of left lower extremity ([place]) 01/03/2020  . Acute pulmonary embolism with acute cor pulmonale ([place]) 12/30/2019  . Acute systolic CHF (congestive heart failure) ([place]) 01/03/2020  . Alcohol dependence ([place]) 07/25/2018  . Aortic atherosclerosis ([place]) 08/09/2018  . COPD (chronic obstructive pulmonary disease) ([place]) 07/25/2018  . Emphysema of lung ([place])   . 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 We will we ultrasound at followup the   . Mediastinal adenopathy 07/25/2018  . Multiple lung nodules on CT 07/25/2018  .  Non-STEMI (non-ST elevated myocardial infarction) ([place]) 01/03/2020  . Osteoarthritis of left knee 05/30/2013   Tricompartmental disease Free-floating mass mostly in the superior lateral patellar compartment.   [name] PFO (patent foramen ovale) 01/03/2020  . Pulmonary embolism ([place]) 12/29/2019  . Renal infarct [facility])     Current Outpatient Medications on File Prior to Visit  Medication Sig Dispense Refill  . acetaminophen (TYLENOL) 500 MG tablet Take 1,000 mg by mouth every 6 (six) hours as needed for headache (pain).    [name] albuterol (VENTOLIN HFA) 108 (90 Base) MCG/ACT inhaler Inhale 2 puffs into the lungs 4 (four) times daily.    [name] apixaban (ELIQUIS) 2.5 MG TABS  tablet Take 1 tablet (2.5 mg total) by mouth 2 (two) times daily. 180 tablet 1  . diclofenac Sodium (VOLTAREN) 1 % GEL Apply 1 application topically 3 (three) times daily as needed (knee pain).    [name] empagliflozin (JARDIANCE) 10 MG TABS tablet Take 1 tablet (10 mg total) by mouth daily before breakfast. 90 tablet 3  . furosemide (LASIX) 40 MG tablet Take 1 tablet (40 mg total) by mouth every other day. 15 tablet 3  . LIDOCAINE EX Apply 1 application topically daily as needed (pain).    . metoprolol succinate (TOPROL-XL) 50 MG 24 hr tablet Take 1 tablet (50 mg total) by mouth daily. 30 tablet 5  . Multiple Vitamin (MULTIVITAMIN WITH MINERALS) TABS tablet Take 1 tablet by mouth daily.    [name] omeprazole (PRILOSEC) 20 MG capsule Take 20 mg by mouth 2 (two) times daily before a meal.    . sacubitril-valsartan (ENTRESTO) 24-26 MG Take 1 tablet by mouth 2 (two) times daily. 60 tablet 0   Current Facility-Administered Medications on File Prior to Visit  Medication Dose Route Frequency Provider Last Rate Last Admin  . sodium chloride flush (NS) 0.9 % injection 10 mL  10 mL Intravenous PRN Croitoru, Mihai, MD   20 mL at 05/28/20 1540    No Known Allergies   Assessment/Plan:  1. CHF with LVEF 40-45% - BP a bit elevated above goal <130/63mmHg with room to further optimize CHF meds. Since pt didn't start Entresto until yesterday due to delay in shipping med, will continue current dose 24-26mg  BID and recheck BMET in 1 week with goal to titrate dose as able (will schedule PharmD follow up at that time). Will continue Toprol 50mg  daily, HR in upper 50s today. Will also start Jardiance 10mg  daily for CHF benefit. Prior authorization submitted and approved through 12/12/21, called $10 copay card into pharmacy.  Aldred Mase E. Shaheim Mahar, PharmD, BCACP, [place] [address], [place], [place] [address] Phone: [phone]; Fax: [phone] 12/31/2020 7:53 AM

## 2020-12-31 NOTE — Telephone Encounter (Signed)
Left message for pt - he missed appt this AM for CHF med titration/BMET. Appt needs to be rescheduled.

## 2021-01-05 ENCOUNTER — Ambulatory Visit (INDEPENDENT_AMBULATORY_CARE_PROVIDER_SITE_OTHER): Payer: No Typology Code available for payment source | Admitting: Nurse Practitioner

## 2021-01-05 ENCOUNTER — Encounter: Payer: Self-pay | Admitting: Nurse Practitioner

## 2021-01-05 ENCOUNTER — Telehealth: Payer: Self-pay

## 2021-01-05 ENCOUNTER — Encounter: Payer: Self-pay | Admitting: Cardiovascular Disease

## 2021-01-05 ENCOUNTER — Other Ambulatory Visit: Payer: Self-pay

## 2021-01-05 VITALS — BP 130/70 | HR 49 | Ht 70.5 in | Wt 197.0 lb

## 2021-01-05 DIAGNOSIS — Z1211 Encounter for screening for malignant neoplasm of colon: Secondary | ICD-10-CM

## 2021-01-05 DIAGNOSIS — K219 Gastro-esophageal reflux disease without esophagitis: Secondary | ICD-10-CM

## 2021-01-05 NOTE — Telephone Encounter (Signed)
Clinical pharmacist to review Eliquis.  Patient had history of submassive PE in early 0940 complicated by renal infarct and atrial flutter.  Based on Dr. Victorino December last note, "It will soon be a year since his initial presentation with pulmonary embolism.  Unprovoked PE with apparent chronic DVT.  I think since his PE was unprovoked and life-threatening, we should keep him on preventive dose oral anticoagulant lifelong."

## 2021-01-05 NOTE — Patient Instructions (Signed)
If you are age 61 or older, your body mass index should be between 23-30. Your Body mass index is 27.87 kg/m. If this is out of the aforementioned range listed, please consider follow up with your Primary Care Provider.  If you are age 59 or younger, your body mass index should be between 19-25. Your Body mass index is 27.87 kg/m. If this is out of the aformentioned range listed, please consider follow up with your Primary Care Provider.   You have been scheduled for a colonoscopy. Please follow written instructions given to you at your visit today.  Please pick up your prep supplies at the pharmacy within the next 1-3 days. If you use inhalers (even only as needed), please bring them with you on the day of your procedure.  Due to recent changes in healthcare laws, you may see the results of your imaging and laboratory studies on MyChart before your provider has had a chance to review them.  We understand that in some cases there may be results that are confusing or concerning to you. Not all laboratory results come back in the same time frame and the provider may be waiting for multiple results in order to interpret others.  Please give [place] 48 hours in order for your provider to thoroughly review all the results before contacting the office for clarification of your results.   Thank you for choosing me and [facility].  Tye Savoy, NP

## 2021-01-05 NOTE — Telephone Encounter (Signed)
Called patient but voice mail was full.

## 2021-01-05 NOTE — Progress Notes (Signed)
ASSESSMENT AND PLAN    # 60 yo male referred by [facility] for screening colonoscopy ( with MAC).   No bowel habit changes, no blood in stool.  No known family history of colon cancer.  --The risks and benefits of colonoscopy with possible polypectomy / biopsies were discussed and the patient agrees to proceed.   # GERD, two year history manifested by hearburn and regurgitation. Symptoms controlled on BID Prilosec --Be sure to take you anti-reflux medication 30 minutes before meal(s) --Anti-reflux measures discussed including avoidance of trigger foods and evening meals / bedtime snacks. If able, elevate head of bed 6-8 inches. If unable to elevate the head of the bed consider a wedge pillow.     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  On Eliquis --Hold Eliquis for 2 days before procedure - will instruct when and how to resume after procedure. Patient understands that there is a low but real risk of cardiovascular event such as heart attack, stroke, or embolism /  thrombosis while off blood thinner. The patient consents to proceed. Will communicate by phone or EMR with patient's prescribing provider to confirm that holding Eliquis is reasonable in this case.   # Empysema on CTA chest Aug 2021  # Hx PFO, s/p repair ~ May 2021  # Hx of Atrial flutter / NSTEMI / chronic CHF with LEFF 40 -45%. Followed by Dr. Sallyanne Kuster   HISTORY OF PRESENT ILLNESS     Chief Complaint : Colon cancer screening  Cody Joyce is a 60 y.o. male with multiple medical problems not limited to chronic CHF, NSTEMI, emyphysema, DVT,  PE on chronic anticoagulation.  See additional PMH below.   Patient is new to the practice, referred by PCP Jodene Nam with the [facility] in [place] for a screening colonoscopy with MAC .  Patient unsure if,  or when he has ever had a colonoscopy.  He has no GI complaints such as bowel changes or blood in stool.  Not anemic based on last  year's labs. He is anticoagulated due to a PE last year.  Other than chronic SHOB since PE patient has no general medical complaints. Findings of  Emphysema on CT chest (patient was unaware).  He is a former smoker .    Data Reviewed:  May 2021 Total bilirubin 0.3, alk phos 141, AST 31, ALT 47, albumin 2.09 Apr 2020 WBC 8.02 Hemoglobin 15.4 Platelets 140  November 2021 creatinine 1.76, BUN 23, GFR 48  PREVIOUS EVALUATIONS:   ? colonooscopy many years ago  Past Medical History:  Diagnosis Date  . Acute deep vein thrombosis (DVT) of left lower extremity ([place]) 01/03/2020  . Acute pulmonary embolism with acute cor pulmonale ([place]) 12/30/2019  . Acute systolic CHF (congestive heart failure) ([place]) 01/03/2020  . Alcohol dependence ([place]) 07/25/2018  . Aortic atherosclerosis ([place]) 08/09/2018  . COPD (chronic obstructive pulmonary disease) ([place]) 07/25/2018  . Emphysema of lung ([place])   . 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 We will we ultrasound at followup the   . Mediastinal adenopathy 07/25/2018  . Multiple lung nodules on CT 07/25/2018  . Non-STEMI (non-ST elevated myocardial infarction) ([place]) 01/03/2020  . Osteoarthritis of left knee 05/30/2013   Tricompartmental disease Free-floating mass mostly in the superior lateral patellar compartment.   [name] PFO (patent foramen ovale) 01/03/2020  . Renal infarct [facility])      Past Surgical History:  Procedure Laterality Date  . BUBBLE STUDY  01/03/2020   Procedure: BUBBLE STUDY;  Surgeon: [name], MD;  Location: [place];  Service: Cardiovascular;;  . IR ANGIOGRAM PULMONARY BILATERAL SELECTIVE  12/30/2019  . IR ANGIOGRAM SELECTIVE EACH ADDITIONAL VESSEL  12/30/2019  . IR ANGIOGRAM SELECTIVE EACH ADDITIONAL VESSEL  12/30/2019  . IR THROMBECT PRIM MECH ADD (INCLU) MOD SED  12/30/2019  . IR THROMBECT PRIM MECH INIT (INCLU) MOD SED  12/30/2019  . IR US GUIDE VASC ACCESS RIGHT  12/30/2019  . RIGHT/LEFT HEART CATH AND  CORONARY ANGIOGRAPHY N/A 01/02/2020   Procedure: RIGHT/LEFT HEART CATH AND CORONARY ANGIOGRAPHY;  Surgeon: [name], MD;  Location: [place] [facility];  Service: Cardiovascular;  Laterality: N/A;  . TEE WITHOUT CARDIOVERSION N/A 01/03/2020   Procedure: TRANSESOPHAGEAL ECHOCARDIOGRAM (TEE)   DEFINITY ;  Surgeon: [name], MD;  Location: [facility] ENDOSCOPY;  Service: Cardiovascular;  Laterality: N/A;   Family History  Problem Relation Age of Onset  . Glaucoma Mother   . Heart disease Father        stents/CAD/AMI age 38s.  . Glaucoma Father    Social History   Tobacco Use  . Smoking status: Light Tobacco Smoker  . Smokeless tobacco: Never Used  Vaping Use  . Vaping Use: Never used  Substance Use Topics  . Alcohol use: Yes    Alcohol/week: 6.0 standard drinks    Types: 2 Glasses of wine, 4 Cans of beer per week  . Drug use: No   Current Outpatient Medications  Medication Sig Dispense Refill  . acetaminophen (TYLENOL) 500 MG tablet Take 1,000 mg by mouth every 6 (six) hours as needed for headache (pain).    [name] albuterol (VENTOLIN HFA) 108 (90 Base) MCG/ACT inhaler Inhale 2 puffs into the lungs 4 (four) times daily.    [name] apixaban (ELIQUIS) 2.5 MG TABS tablet Take 1 tablet (2.5 mg total) by mouth 2 (two) times daily. 180 tablet 1  . diclofenac Sodium (VOLTAREN) 1 % GEL Apply 1 application topically 3 (three) times daily as needed (knee pain).    . furosemide (LASIX) 40 MG tablet Take 1 tablet (40 mg total) by mouth every other day. 15 tablet 3  . LIDOCAINE EX Apply 1 application topically daily as needed (pain).    . metoprolol succinate (TOPROL-XL) 50 MG 24 hr tablet Take 1 tablet (50 mg total) by mouth daily. 30 tablet 5  . Multiple Vitamin (MULTIVITAMIN WITH MINERALS) TABS tablet Take 1 tablet by mouth daily.    [name] omeprazole (PRILOSEC) 20 MG capsule Take 20 mg by mouth 2 (two) times daily before a meal.    . sacubitril-valsartan (ENTRESTO) 24-26 MG Take 1 tablet by mouth 2  (two) times daily. 60 tablet 0   No current facility-administered medications for this visit.   Facility-Administered Medications Ordered in Other Visits  Medication Dose Route Frequency Provider Last Rate Last Admin  . sodium chloride flush (NS) 0.9 % injection 10 mL  10 mL Intravenous PRN Croitoru, Mihai, MD   20 mL at 05/28/20 1540   No Known Allergies   Review of Systems: Positive for arthritis.  All other systems reviewed and negative except where noted in HPI.    PHYSICAL EXAM :    Wt Readings from Last 3 Encounters:  01/05/21 197 lb (89.4 kg)  11/21/20 196 lb (88.9 kg)  05/16/20 196 lb 9.6 oz (89.2 kg)    Ht 5' 10.5" (1.791 m) Comment: height measured without  shoes  Wt 197 lb (89.4 kg)   BMI 27.87 kg/m  Constitutional:  Pleasant male in no acute distress. Psychiatric: Normal mood and affect. Behavior is normal. EENT: Pupils normal.  Conjunctivae are normal. No scleral icterus. Neck supple.  Cardiovascular: Normal rate, regular rhythm. No edema Pulmonary/chest: Effort normal and breath sounds normal. No wheezing, rales or rhonchi. Abdominal: Soft, nondistended, nontender. Bowel sounds active throughout. There are no masses palpable. No hepatomegaly. Neurological: Alert and oriented to person place and time. Skin: Skin is warm and dry. No rashes noted.  Tye Savoy, NP  01/05/2021, 8:34 AM  Cc:  Referring Provider [place], [place]

## 2021-01-05 NOTE — Progress Notes (Signed)
Reviewed.  Dennie Vecchio L. Alejandra Barna, MD, MPH  

## 2021-01-05 NOTE — Telephone Encounter (Signed)
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    Procedure: colonoscopy Date of procedure: 03/02/21  Per Dr. Loletha Grayer- aflutter This was likely triggered by right heart strain during pulmonary embolism and has not recurred.  [id number] (embolic event, LV dysfunction), but unless the arrhythmia recurs, we will keep him on prophylactic dose anticoagulation only.  CrCl 48.4 ml/min Platelet count 322  Due to patients hx of unproved massive PE, I would recommend he hold anticoagulation only 1 day prior to procedure. Since MD is requesting a 2 day hold, I will defer to Dr. Loletha Grayer.

## 2021-01-05 NOTE — Telephone Encounter (Signed)
error 

## 2021-01-05 NOTE — Telephone Encounter (Signed)
[facility] Pre-operative Risk Assessment     Request for surgical clearance:     Endoscopy Procedure  What type of surgery is being performed?    Colonoscopy    When is this surgery scheduled?     03/02/21  What type of clearance is required ?   Pharmacy  Are there any medications that need to be held prior to surgery and how long? Eliquis 2 days   Practice name and name of physician performing surgery?      [facility]  What is your office phone and fax number?      Phone- [phone]  [phone]  Anesthesia type (None, local, MAC, general) ?       MAC

## 2021-01-16 NOTE — Telephone Encounter (Signed)
Called patient and let him know to hold eliquis 2 days prior to his procedure. Patient stated he understood.

## 2021-01-21 ENCOUNTER — Telehealth: Payer: Self-pay | Admitting: Cardiovascular Disease

## 2021-01-21 MED ORDER — SACUBITRIL-VALSARTAN 24-26 MG PO TABS: 1.0000 | ORAL_TABLET | Freq: Two times a day (BID) | ORAL | 11 refills | Status: DC

## 2021-01-21 NOTE — Telephone Encounter (Signed)
*  STAT* If patient is at the pharmacy, call can be transferred to refill team.   1. Which medications need to be refilled? (please list name of each medication and dose if known) Sacubitril-vaslsartan  2. Which pharmacy/location (including street and city if local pharmacy) is [facility]  3. Do they need a 30 day or 90 day supply? 60   Patient is out of lmedication

## 2021-01-28 ENCOUNTER — Telehealth: Payer: Self-pay | Admitting: Cardiovascular Disease

## 2021-01-28 MED ORDER — SACUBITRIL-VALSARTAN 24-26 MG PO TABS: 1.0000 | ORAL_TABLET | Freq: Two times a day (BID) | ORAL | 3 refills | Status: DC

## 2021-01-28 NOTE — Telephone Encounter (Signed)
Printed Rx and co-pay card left at front desk for patient to pick up

## 2021-01-28 NOTE — Telephone Encounter (Signed)
Spoke with patient of Dr. Loletha Grayer - he reports his [facility]. He reports his prescriptions are more cost effective at the [place]. Unsure if he can use a co-pay card with [employer]. Offered to send a prescription to the [facility] pharmacy, offered to provide a printed prescription, offered a co-pay card to see if this can be used. Patient is going to check with his wife about these things and follow up with [place]  Routed to primary nurse as Juluis Rainier - in case printed Rx is needed

## 2021-01-28 NOTE — Addendum Note (Signed)
Addended by: Fidel Levy on: 01/28/2021 01:22 PM   Modules accepted: Orders

## 2021-01-28 NOTE — Telephone Encounter (Signed)
Pt c/o medication issue:  1. Name of Medication: sacubitril-valsartan (ENTRESTO) 24-26 MG  2. How are you currently taking this medication (dosage and times per day)? Take 1 tablet by mouth 2 (two) times daily.  3. Are you having a reaction (difficulty breathing--STAT)? No  4. What is your medication issue? [facility] and it costs $60.00, he would like to know if he can get some type of medication assistance or a coupon. Pt states if he picks the meds up from the [place] he only pays $20 per month

## 2021-03-02 ENCOUNTER — Ambulatory Visit (AMBULATORY_SURGERY_CENTER): Payer: No Typology Code available for payment source | Admitting: Gastroenterology

## 2021-03-02 ENCOUNTER — Other Ambulatory Visit: Payer: Self-pay

## 2021-03-02 ENCOUNTER — Encounter: Payer: Self-pay | Admitting: Gastroenterology

## 2021-03-02 VITALS — BP 117/67 | HR 42 | Temp 97.6°F | Resp 15 | Ht 70.5 in | Wt 197.0 lb

## 2021-03-02 DIAGNOSIS — Z1211 Encounter for screening for malignant neoplasm of colon: Secondary | ICD-10-CM | POA: Diagnosis not present

## 2021-03-02 DIAGNOSIS — D124 Benign neoplasm of descending colon: Secondary | ICD-10-CM

## 2021-03-02 DIAGNOSIS — D122 Benign neoplasm of ascending colon: Secondary | ICD-10-CM

## 2021-03-02 MED ORDER — SODIUM CHLORIDE 0.9 % IV SOLN: 500.0000 mL | INTRAVENOUS | Status: DC

## 2021-03-02 NOTE — Progress Notes (Signed)
Called to room to assist during endoscopic procedure.  Patient ID and intended procedure confirmed with present staff. Received instructions for my participation in the procedure from the performing physician.  

## 2021-03-02 NOTE — Op Note (Signed)
[place] Patient Name: Cody Joyce Procedure Date: 03/02/2021 3:06 PM MRN: [id number] Endoscopist: Thornton Park MD, MD Age: 60 Referring MD:  Date of Birth: 06-19-1961 Gender: Male Account #: [id number] Procedure:                Colonoscopy Indications:              Screening for colorectal malignant neoplasm, This                            is the patient's first colonoscopy Medicines:                Monitored Anesthesia Care Procedure:                Pre-Anesthesia Assessment:                           - Prior to the procedure, a History and Physical                            was performed, and patient medications and                            allergies were reviewed. The patient's tolerance of                            previous anesthesia was also reviewed. The risks                            and benefits of the procedure and the sedation                            options and risks were discussed with the patient.                            All questions were answered, and informed consent                            was obtained. Prior Anticoagulants: The patient has                            taken Eliquis (apixaban), last dose was 2 days                            prior to procedure. ASA Grade Assessment: III - A                            patient with severe systemic disease. After                            reviewing the risks and benefits, the patient was                            deemed in satisfactory condition to undergo the  procedure.                           After obtaining informed consent, the colonoscope                            was passed under direct vision. Throughout the                            procedure, the patient's blood pressure, pulse, and                            oxygen saturations were monitored continuously. The                            CF [id number] #[id number] was introduced through the anus                             and advanced to the 3 cm into the ileum. A second                            forward view of the right colon was performed. The                            colonoscopy was performed without difficulty. The                            patient tolerated the procedure well. The quality                            of the bowel preparation was good. The terminal                            ileum, ileocecal valve, appendiceal orifice, and                            rectum were photographed. Scope In: 3:13:16 PM Scope Out: 3:30:58 PM Scope Withdrawal Time: 0 hours 14 minutes 55 seconds  Total Procedure Duration: 0 hours 17 minutes 42 seconds  Findings:                 The perianal and digital rectal examinations were                            normal.                           Two sessile polyps were found in the descending                            colon. The polyps were 2 to 3 mm in size. These                            polyps were removed with a cold snare. Resection  and retrieval were complete. Estimated blood loss                            was minimal.                           Two sessile polyps were found in the ascending                            colon. The polyps were 2 to 4 mm in size. These                            polyps were removed with a cold snare. Resection                            and retrieval were complete. Estimated blood loss                            was minimal.                           The exam was otherwise without abnormality on                            direct and retroflexion views. Complications:            No immediate complications. Estimated blood loss:                            Minimal. Estimated Blood Loss:     Estimated blood loss was minimal. Impression:               - Two 2 to 3 mm polyps in the descending colon,                            removed with a cold snare. Resected and retrieved.                            - Two 2 to 4 mm polyps in the ascending colon,                            removed with a cold snare. Resected and retrieved.                           - The examination was otherwise normal on direct                            and retroflexion views. Recommendation:           - Patient has a contact number available for                            emergencies. The signs and symptoms of potential  delayed complications were discussed with the                            patient. Return to normal activities tomorrow.                            Written discharge instructions were provided to the                            patient.                           - Resume previous diet.                           - Continue present medications.                           - Await pathology results.                           - Repeat colonoscopy date to be determined after                            pending pathology results are reviewed for                            surveillance.                           - Resume Eliquis (apixaban) at prior dose tomorrow.                           - Emerging evidence supports eating a diet of                            fruits, vegetables, grains, calcium, and yogurt                            while reducing red meat and alcohol may reduce the                            risk of colon cancer.                           - Given these results, all first degree relatives                            (brothers, sisters, children, parents) should start                            colon cancer screening at age 68.                           - Thank you for allowing me to be involved in your  colon cancer prevention. Thornton Park MD, MD 03/02/2021 3:38:50 PM This report has been signed electronically.

## 2021-03-02 NOTE — Patient Instructions (Signed)
YOU HAD AN ENDOSCOPIC PROCEDURE TODAY AT THE [facility]:   Refer to the procedure report that was given to you for any specific questions about what was found during the examination.  If the procedure report does not answer your questions, please call your gastroenterologist to clarify.  If you requested that your care partner not be given the details of your procedure findings, then the procedure report has been included in a sealed envelope for you to review at your convenience later.  YOU SHOULD EXPECT: Some feelings of bloating in the abdomen. Passage of more gas than usual.  Walking can help get rid of the air that was put into your GI tract during the procedure and reduce the bloating. If you had a lower endoscopy (such as a colonoscopy or flexible sigmoidoscopy) you may notice spotting of blood in your stool or on the toilet paper. If you underwent a bowel prep for your procedure, you may not have a normal bowel movement for a few days.  Please Note:  You might notice some irritation and congestion in your nose or some drainage.  This is from the oxygen used during your procedure.  There is no need for concern and it should clear up in a day or so.  SYMPTOMS TO REPORT IMMEDIATELY:   Following lower endoscopy (colonoscopy or flexible sigmoidoscopy):  Excessive amounts of blood in the stool  Significant tenderness or worsening of abdominal pains  Swelling of the abdomen that is new, acute  Fever of 100F or higher   Following upper endoscopy (EGD)  Vomiting of blood or coffee ground material  New chest pain or pain under the shoulder blades  Painful or persistently difficult swallowing  New shortness of breath  Fever of 100F or higher  Black, tarry-looking stools  For urgent or emergent issues, a gastroenterologist can be reached at any hour by calling [phone]. Do not use MyChart messaging for urgent concerns.    DIET:  We do recommend a small meal at first, but  then you may proceed to your regular diet.  Drink plenty of fluids but you should avoid alcoholic beverages for 24 hours.  ACTIVITY:  You should plan to take it easy for the rest of today and you should NOT DRIVE or use heavy machinery until tomorrow (because of the sedation medicines used during the test).    FOLLOW UP: Our staff will call the number listed on your records 48-72 hours following your procedure to check on you and address any questions or concerns that you may have regarding the information given to you following your procedure. If we do not reach you, we will leave a message.  We will attempt to reach you two times.  During this call, we will ask if you have developed any symptoms of COVID 19. If you develop any symptoms (ie: fever, flu-like symptoms, shortness of breath, cough etc.) before then, please call [phone].  If you test positive for Covid 19 in the 2 weeks post procedure, please call and report this information to us.    If any biopsies were taken you will be contacted by phone or by letter within the next 1-3 weeks.  Please call us at [phone] if you have not heard about the biopsies in 3 weeks.    SIGNATURES/CONFIDENTIALITY: You and/or your care partner have signed paperwork which will be entered into your electronic medical record.  These signatures attest to the fact that that the information above on   your After Visit Summary has been reviewed and is understood.  Full responsibility of the confidentiality of this discharge information lies with you and/or your care-partner. 

## 2021-03-02 NOTE — Progress Notes (Signed)
Report to PACU, RN, vss, BBS= Clear.  

## 2021-03-04 ENCOUNTER — Other Ambulatory Visit: Payer: Self-pay

## 2021-03-04 ENCOUNTER — Telehealth: Payer: Self-pay | Admitting: *Deleted

## 2021-03-04 ENCOUNTER — Ambulatory Visit: Payer: No Typology Code available for payment source | Admitting: Cardiovascular Disease

## 2021-03-04 ENCOUNTER — Ambulatory Visit (INDEPENDENT_AMBULATORY_CARE_PROVIDER_SITE_OTHER): Payer: No Typology Code available for payment source | Admitting: Cardiovascular Disease

## 2021-03-04 VITALS — BP 138/64 | HR 52 | Ht 70.5 in | Wt 191.0 lb

## 2021-03-04 DIAGNOSIS — I5022 Chronic systolic (congestive) heart failure: Secondary | ICD-10-CM | POA: Diagnosis not present

## 2021-03-04 DIAGNOSIS — I87009 Postthrombotic syndrome without complications of unspecified extremity: Secondary | ICD-10-CM

## 2021-03-04 DIAGNOSIS — Z86711 Personal history of pulmonary embolism: Secondary | ICD-10-CM

## 2021-03-04 DIAGNOSIS — Q211 Atrial septal defect: Secondary | ICD-10-CM | POA: Diagnosis not present

## 2021-03-04 DIAGNOSIS — E119 Type 2 diabetes mellitus without complications: Secondary | ICD-10-CM

## 2021-03-04 DIAGNOSIS — I483 Typical atrial flutter: Secondary | ICD-10-CM

## 2021-03-04 DIAGNOSIS — N1832 Chronic kidney disease, stage 3b: Secondary | ICD-10-CM

## 2021-03-04 DIAGNOSIS — Q2112 Patent foramen ovale: Secondary | ICD-10-CM

## 2021-03-04 MED ORDER — SACUBITRIL-VALSARTAN 49-51 MG PO TABS: 1.0000 | ORAL_TABLET | Freq: Two times a day (BID) | ORAL | 6 refills | Status: DC

## 2021-03-04 NOTE — Patient Instructions (Signed)
Medication Instructions:  INCREASE the Entresto to 49-51 mg twice daily  START the Jardiance 10 mg once daily in one month. Samples given.   *If you need a refill on your cardiac medications before your next appointment, please call your pharmacy*   Lab Work: Your provider would like for you to return in 6 weeks at your appointment with pharmd to have the following labs drawn: BMET. You do not need an appointment for the lab. Once in our office lobby there is a podium where you can sign in and ring the doorbell to alert [place] that you are here. The lab is open from 8:00 am to 4:30 pm; closed for lunch from 12:45pm-1:45pm.  If you have labs (blood work) drawn today and your tests are completely normal, you will receive your results only by: [place] (if you have MyChart) OR A paper copy in the mail If you have any lab test that is abnormal or we need to change your treatment, we will call you to review the results.   Testing/Procedures: None ordered   Follow-Up: At [facility], you and your health needs are our priority.  As part of our continuing mission to provide you with exceptional heart care, we have created designated Provider Care Teams.  These Care Teams include your primary Cardiologist (physician) and Advanced Practice Providers (APPs -  Physician Assistants and Nurse Practitioners) who all work together to provide you with the care you need, when you need it.  We recommend signing up for the patient portal called "MyChart".  Sign up information is provided on this After Visit Summary.  MyChart is used to connect with patients for Virtual Visits (Telemedicine).  Patients are able to view lab/test results, encounter notes, upcoming appointments, etc.  Non-urgent messages can be sent to your provider as well.   To learn more about what you can do with MyChart, go to [email or web address].    Your next appointment:   Follow up with pharmd in 6 weeks for medication and  labs Follow up in November with Dr. Sallyanne Kuster

## 2021-03-04 NOTE — Telephone Encounter (Signed)
  Follow up Call-  Call back number 03/02/2021  Post procedure Call Back phone  # [phone]  Permission to leave phone message Yes  Some recent data might be hidden     Patient questions:  Do you have a fever, pain , or abdominal swelling? No. Pain Score  0 *  Have you tolerated food without any problems? Yes.    Have you been able to return to your normal activities? Yes.    Do you have any questions about your discharge instructions: Diet   No. Medications  No. Follow up visit  No.  Do you have questions or concerns about your Care? No.  Actions: * If pain score is 4 or above: No action needed, pain <4.  Have you developed a fever since your procedure? no  2.   Have you had an respiratory symptoms (SOB or cough) since your procedure? no  3.   Have you tested positive for COVID 19 since your procedure no  4.   Have you had any family members/close contacts diagnosed with the COVID 19 since your procedure?  no   If yes to any of these questions please route to Joylene John, RN and Joella Prince, RN

## 2021-03-04 NOTE — Progress Notes (Signed)
Cardiology Office Note:    Date:  03/05/2021   ID:  Cody Joyce, DOB Mar 07, 1961, MRN [id number]  PCP:  [facility], [facility]  [facility] Cardiologist:  Sanda Klein, MD  [place] Electrophysiologist:  None   Referring MD: Posey Pronto, *   CC: CHF. History of DVT/PE.  History of Present Illness:    Cody Joyce is a 60 y.o. 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   He is doing well.  He denies any problems with shortness of breath at rest or with usual activity and has not had chest pain, dizziness, syncope, palpitations or leg edema.  Denies any bleeding issues.  He is currently on the prophylactic dose of apixaban.  He is still on the starting dose of Entresto and although Vania Rea is listed on his medication list, he has actually never started that.  He is on a relatively small dose of beta-blocker, limited by resting bradycardia.  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  L/R cath on 01/02/2020 showed normal coronary arteries. TEE during hospitalization on 01/03/2020 showed EF of 30-35% with moderated reduced left ventricular function with global hypokinesis. The right ventricle function was severely reduced with severe enlargement. Biatrial enlargement noted. AV and MV without stenosis or regurg. Agitated bubble study with findings of large PFO with bidirectional shunting.   Repeat outpatient TTE on 03/12/2020 showed improvement in EF to 40-45% with continued left ventricular dysfunction and global hypokinesis. Right ventricular systolic function has improved to normal. MV appears normal. Left venous doppler obtained on the same day shows chronic left popliteal and common femoral DVTs.    In July 2021, he was hospitalized and treated for hypoxia while traveling to [facility].  His records states that he required 4 L nasal cannula oxygen because he would desaturate to the low 80% range on room air.  CT of the chest did not show evidence of new pulmonary embolism, but showed "groundglass" opacities in the lower lobes. He reportedly had 3 negative Covid test (see documentation of at least 1 of these).  He was treated for Covid nonetheless.  An extensive panel of respiratory pathogen test was also negative (including Legionella, RSV, flu etc).  During that same hospitalization his white blood cell count was elevated at 17K.  Procalcitonin was elevated of 0.2 and CRP was markedly elevated at 29.  His renal function was markedly improved from the values we have recorded in [place] with a creatinine of 1.2.  Following that hospitalization he had some troubles with epigastric discomfort and small amounts of hematochezia.  He went to the emergency room in [place] after returning from [place] with abdominal pain.  He had a CT of the chest and abdomen/pelvis that showed normal findings.  Creatinine was elevated at 1.8.  He was discharged from the emergency room and has been feeling well since then.  Another follow-up echocardiogram 05/28/2020 still showed LVEF 40-45% and there was no evidence of residual shunting across the atrial septum, but either color Doppler or saline contrast study.  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    PFO (patent foramen ovale) 01/03/2020   Pulmonary embolism ([place]) 12/29/2019   Renal infarct [employer])     Past Surgical History:  Procedure Laterality Date   BUBBLE STUDY  01/03/2020   Procedure: BUBBLE STUDY;  Surgeon: [name], MD;  Location: [facility];  Service: Cardiovascular;;   IR ANGIOGRAM PULMONARY BILATERAL SELECTIVE  12/30/2019   IR ANGIOGRAM SELECTIVE EACH ADDITIONAL VESSEL  12/30/2019   IR ANGIOGRAM SELECTIVE EACH ADDITIONAL VESSEL  12/30/2019   IR THROMBECT PRIM MECH ADD ([place]) MOD SED  12/30/2019   IR THROMBECT PRIM MECH INIT ([place]) MOD SED  12/30/2019   IR US GUIDE VASC ACCESS RIGHT  12/30/2019   RIGHT/LEFT HEART CATH AND CORONARY ANGIOGRAPHY N/A 01/02/2020   Procedure: RIGHT/LEFT HEART CATH AND CORONARY ANGIOGRAPHY;  Surgeon: [name], MD;  Location: [facility];  Service: Cardiovascular;  Laterality: N/A;   TEE WITHOUT CARDIOVERSION N/A 01/03/2020   Procedure: TRANSESOPHAGEAL ECHOCARDIOGRAM (TEE)   DEFINITY ;  Surgeon: [name], MD;  Location[facility] END[facility]COPY;  Service: Cardiovascular;  Laterality: N/A;    Current Medications: Current Meds  Medication Sig   acetaminophen (TYLENOL) 500 MG tablet Take 1,000 mg by mouth every 6 (six) hours as needed for headache (pain).   albuterol (VENTOLIN HFA) 108 (90 Base) MCG/ACT inhaler Inhale 2 puffs into the lungs 4 (four) times daily.   apixaban (ELIQUIS) 2.5 MG TABS tablet Take 1 tablet (2.5 mg total) by mouth 2 (two) times daily.   Cholecalciferol 50 MCG (2000 UT) TABS Take 1 tablet by mouth daily.   cyclobenzaprine (FLEXERIL) 10 MG tablet Take 1 tablet by mouth as needed.   diclofenac Sodium  (VOLTAREN) 1 % GEL Apply 1 application topically 3 (three) times daily as needed (knee pain).   empagliflozin (JARDIANCE) 10 MG TABS tablet Take 1 tablet (10 mg total) by mouth daily before breakfast.   furosemide (LASIX) 40 MG tablet Take 1 tablet (40 mg total) by mouth every other day.   gabapentin (NEURONTIN) 300 MG capsule Take 1 capsule by mouth as needed.   LIDOCAINE EX Apply 1 application topically daily as needed (pain).   metoprolol succinate (TOPROL-XL) 50 MG 24 hr tablet Take 1 tablet (50 mg total) by mouth daily.   Multiple Vitamin (MULTIVITAMIN WITH MINERALS) TABS tablet Take 1 tablet by mouth daily.   omeprazole (PRILOSEC) 20 MG capsule Take 20 mg by mouth 2 (two) times daily before a meal.   sacubitril-valsartan (ENTRESTO) 49-51 MG Take 1 tablet by mouth 2 (two) times daily.   sildenafil (VIAGRA) 100 MG tablet Take 1 tablet by mouth daily.   [DISCONTINUED] sacubitril-valsartan (ENTRESTO) 24-26 MG Take 1 tablet by mouth 2 (two) times daily.     Allergies:   Patient has no known allergies.   Social History   Socioeconomic History   Marital status: Married  Spouse name: Not on file   Number of children: 0   Years of education: Not on file   Highest education level: Not on file  Occupational History   Occupation: self employed  Tobacco Use   Smoking status: Former    Pack years: 0.00    Types: Cigarettes    Quit date: 01/05/2006    Years since quitting: 15.1   Smokeless tobacco: Never  Vaping Use   Vaping Use: Never used  Substance and Sexual Activity   Alcohol use: Yes    Alcohol/week: 6.0 standard drinks    Types: 2 Glasses of wine, 4 Cans of beer per week    Comment: 0-2 per day   Drug use: No   Sexual activity: Yes  Other Topics Concern   Not on file  Social History Narrative   Marital status:  Married x 3 years; not happily married.      Children:  None      Lives: alone.      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      Tobacco:  smoke cigars 2 per day.  Quit cigarettes in 2001; smoked x 15 years.      Alcohol:  Weekends 4 beers per week; 2 drinks per week.      Drugs:  None      Exercising:  Three days per week; active job.        Seatbelt:  100%      Guns:  None      Sexual activity: condoms; Gonorrhea in high school.  One partner in past year.  Last STD screening 2013.   Social Determinants of Health   Financial Resource Strain: Not on file  Food Insecurity: Not on file  Transportation Needs: Not on file  Physical Activity: Not on file  Stress: Not on file  Social Connections: Not on file    Family History: The patient's *family history includes Glaucoma in his father and mother; Heart disease in his father; Ovarian cancer in his cousin; Pancreatic cancer in his sister. There is no history of Colon cancer, Colitis, Esophageal cancer, Rectal cancer, or Stomach cancer.  ROS:   Please see the history of present illness.    All other systems reviewed and are negative.  EKGs/Labs/Other Studies Reviewed:    The following studies were reviewed today:  TEE (01/03/2020)  1. There is a smoke in the left ventricular apex with no evidence for a  thrombus on Definity echo contrast images.. Left ventricular ejection  fraction, by estimation, is 30 to 35%. The left ventricle has moderately decreased function. The left ventricle demonstrates global hypokinesis. There is the interventricular septum is flattened in systole and diastole, consistent with right ventricular pressure and volume overload.   2. Right ventricular systolic function is severely reduced. The right  ventricular size is severely enlarged. There is normal pulmonary artery systolic pressure.   3. Left atrial size was moderately dilated. No left atrial/left atrial appendage thrombus was detected.   4. Right atrial size was moderately dilated.   5. The mitral valve is normal in structure. Mild mitral valve regurgitation. No evidence of mitral stenosis.    6. The aortic valve is normal in structure. Aortic valve regurgitation is not visualized. No aortic stenosis is present.   7. The inferior vena cava is normal in size with greater than 50% respiratory variability, suggesting right atrial pressure of 3 mmHg.   8. Agitated saline contrast bubble study was significantly  positive with shunting observed within the first cardiac cycles suggestive of  interatrial shunt. There is a large patent foramen ovale with bidirectional shunting across atrial septum.   Conclusion(s)/Recommendation(s): Findings are concerning for an  interatrial shunt as detailed above.   TTE (03/12/2020)  1. Left ventricular ejection fraction, by estimation, is 40 to 45%. The  left ventricle has mildly decreased function. The left ventricle demonstrates global hypokinesis. Left ventricular diastolic parameters are indeterminate. The average left ventricular global longitudinal strain is -14.0 %.   2. Right ventricular systolic function is normal. The right ventricular size is normal. There is normal pulmonary artery systolic pressure.   3. Left atrial size was mildly dilated.   4. The mitral valve is normal in structure. Trivial mitral valve regurgitation.   5. The aortic valve is tricuspid. Aortic valve regurgitation is not visualized. No aortic stenosis is present.   6. The inferior vena cava is normal in size with greater than 50% respiratory variability, suggesting right atrial pressure of 3 mmHg.   Conclusion(s)/Recommendation(s): EF reduced, though visually and by  measurement appears slightly improved compared to prior. RV appears  normal, normal RVSP. Mitral valve appears normal, previously noted as  myxomatous. Strain mildly and diffusely abnormal   throughout LV.   Echo 05/28/2020  1. Left ventricular ejection fraction, by estimation, is 40 to 45%. The  left ventricle has mildly decreased function. The left ventricle  demonstrates global hypokinesis. Left ventricular  diastolic parameters are  consistent with Grade I diastolic  dysfunction (impaired relaxation).   2. Right ventricular systolic function is normal. The right ventricular  size is normal. Tricuspid regurgitation signal is inadequate for assessing  PA pressure.   3. The mitral valve is normal in structure. No evidence of mitral valve  regurgitation. No evidence of mitral stenosis.   4. The aortic valve is tricuspid. Aortic valve regurgitation is not  visualized. No aortic stenosis is present.   5. The inferior vena cava is normal in size with greater than 50%  respiratory variability, suggesting right atrial pressure of 3 mmHg.   6. Negative bubble study, no evidence for PFO or ASD.   EKG:  EKG is ordered today.  Personally reviewed, shows sinus bradycardia 52 bpm and ST segment depression in the inferior leads, not much different from previous tracings.   Recent Labs: 04/09/2020: ALT 23; Hemoglobin 15.2; Platelets 322 05/16/2020: BNP 163.8 12/22/2020: BUN 22; Creatinine, Ser 1.70; Potassium 4.6; Sodium 142   Recent Lipid Panel No results found for: CHOL, HDL, LDLCALC, LDLDIRECT, TRIG, CHOLHDL  Physical Exam:    VS:  BP 138/64   Pulse (!) 52   Ht 5' 10.5" (1.791 m)   Wt 191 lb (86.6 kg)   SpO2 97%   BMI 27.02 kg/m     Wt Readings from Last 3 Encounters:  03/04/21 191 lb (86.6 kg)  03/02/21 197 lb (89.4 kg)  01/05/21 197 lb (89.4 kg)      General: Alert, oriented x3, no distress, appears younger than stated age Head: no evidence of trauma, PERRL, EOMI, no exophtalmos or lid lag, no myxedema, no xanthelasma; normal ears, nose and oropharynx Neck: normal jugular venous pulsations and no hepatojugular reflux; brisk carotid pulses without delay and no carotid bruits Chest: clear to auscultation, no signs of consolidation by percussion or palpation, normal fremitus, symmetrical and full respiratory excursions Cardiovascular: normal position and quality of the apical impulse, regular  rhythm, normal first and second heart sounds, no murmurs, rubs or gallops  Abdomen: no tenderness or distention, no masses by palpation, no abnormal pulsatility or arterial bruits, normal bowel sounds, no hepatosplenomegaly Extremities: no clubbing, cyanosis or edema; 2+ radial, ulnar and brachial pulses bilaterally; 2+ right femoral, posterior tibial and dorsalis pedis pulses; 2+ left femoral, posterior tibial and dorsalis pedis pulses; no subclavian or femoral bruits Neurological: grossly nonfocal Psych: Normal mood and affect    ASSESSMENT:    1. Chronic systolic heart failure ([place])   2. History of pulmonary embolism   3. Postphlebitic syndrome   4. PFO (patent foramen ovale)   5. Typical atrial flutter ([facility])   6. Stage 3b chronic kidney disease ([place])   7. Controlled type 2 diabetes mellitus without complication, without long-term current use of insulin ([facility])     PLAN:    In order of problems listed above:  HFrEF: The etiology of his nonischemic cardiomyopathy remains unclear, but the low ejection fraction has persisted outside periods of critical illness.  LVEF has improved with medical therapy and today we will try to increase the dose of Entresto.  We had to reduce the dose of beta-blocker due to junctional rhythm.  Avoiding aldosterone antagonist due to moderate renal function abnormalities.  Would also be an excellent candidate for Jardiance since he has both diabetes and heart failure.  Have asked him to first doubled her dose of Entresto and after about a month we will start Jardiance and check labs.   Submassive PE with chronic DVT: I now on prophylactic dose anticoagulation.  Since his DVT/PE was unprovoked and was extremely dangerous, would recommend lifelong anticoagulation. Postphlebitic syndrome: He has chronic DVT, but the swelling and discomfort in his left lower leg appears to be less of a complaint at this time.  Intermittently wearing compression stockings. PFO/ASD: No  shunting was seen on his most recent echocardiogram, either by Doppler or saline contrast injection.  He likely popped open a PFO due to sudden marked increase in right heart pressures when he had his pulmonary embolism. Atrial Flutter: No clinical recurrence.  This was likely triggered by right heart strain during pulmonary embolism .  [id number] 3 (embolic event, LV dysfunction), but unless the arrhythmia recurs, we will keep him on prophylactic dose anticoagulation only. Junctional rhythm: Back in sinus bradycardia after we reduce his dose of metoprolol to 50 mg once daily. 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  Need to recheck after we increase the dose of Entresto and after we add the Jardiance. DM: Vania Rea is a good choice in him since it will not cause hypoglycemia when used as monotherapy and will be good treatment for heart failure and protective for his renal function.  Hopefully can get this medication through the [place] and we can also try to apply for assistance from the manufacturer.     Sanda Klein, MD, [place] [phone] 03/05/2021, 3:15 PM   Medication Adjustments/Labs and Tests Ordered: Current medicines are reviewed at length with the patient today.  Concerns regarding medicines are outlined above.   Orders Placed This Encounter  Procedures   Basic metabolic panel   EKG 03-ESPQ    Meds ordered this encounter  Medications   sacubitril-valsartan (ENTRESTO) 49-51 MG    Sig: Take 1 tablet by mouth 2 (two) times daily.    Dispense:  60 tablet    Refill:  6     Patient  Instructions  Medication Instructions:  INCREASE the Entresto to 49-51 mg twice daily  START the Jardiance 10 mg once daily in one month. Samples given.   *If you need a refill on your cardiac medications before your next appointment, please call  your pharmacy*   Lab Work: Your provider would like for you to return in 6 weeks at your appointment with pharmd to have the following labs drawn: BMET. You do not need an appointment for the lab. Once in our office lobby there is a podium where you can sign in and ring the doorbell to alert [place] that you are here. The lab is open from 8:00 am to 4:30 pm; closed for lunch from 12:45pm-1:45pm.  If you have labs (blood work) drawn today and your tests are completely normal, you will receive your results only by: [place] (if you have MyChart) OR A paper copy in the mail If you have any lab test that is abnormal or we need to change your treatment, we will call you to review the results.   Testing/Procedures: None ordered   Follow-Up: At [facility], you and your health needs are our priority.  As part of our continuing mission to provide you with exceptional heart care, we have created designated Provider Care Teams.  These Care Teams include your primary Cardiologist (physician) and Advanced Practice Providers (APPs -  Physician Assistants and Nurse Practitioners) who all work together to provide you with the care you need, when you need it.  We recommend signing up for the patient portal called "MyChart".  Sign up information is provided on this After Visit Summary.  MyChart is used to connect with patients for Virtual Visits (Telemedicine).  Patients are able to view lab/test results, encounter notes, upcoming appointments, etc.  Non-urgent messages can be sent to your provider as well.   To learn more about what you can do with MyChart, go to [email or web address].    Your next appointment:   Follow up with pharmd in 6 weeks for medication and labs Follow up in November with Dr. Sallyanne Kuster  Signed, Dr. Jose Persia Internal Medicine PGY-2 03/05/2021, 3:15 PM

## 2021-03-05 NOTE — Progress Notes (Signed)
I called to notify Mr. and Cody Joyce that I may have inadvertently exposed them to COVID-19 yesterday.  I was asymptomatic yesterday and had tested negative for the illness earlier that same day, but unfortunately my test was positive this morning.  I advised him to look out for symptoms of active viral infection such as fever, cough, sore throat, dyspnea, loss of smell/taste, etc. and to promptly test for COVID-19 if the symptoms appear.  He should call and ask for advice about available treatments if this should happen.

## 2021-03-11 ENCOUNTER — Encounter: Payer: Self-pay | Admitting: Gastroenterology

## 2021-03-12 ENCOUNTER — Telehealth: Payer: Self-pay | Admitting: Cardiovascular Disease

## 2021-03-12 MED ORDER — NIRMATRELVIR/RITONAVIR (PAXLOVID) TABLET (RENAL DOSING): 2.0000 | ORAL_TABLET | Freq: Two times a day (BID) | ORAL | 0 refills | Status: AC

## 2021-03-12 NOTE — Telephone Encounter (Signed)
Called the patient back. He stated that he tested positive yesterday for covid. He is asymptomatic and stated that he felt good.  The medication has been sent in for him. He stated that he probably will not take it since he felt fine. If he does take it, he has been advised to not take the sildenafil for 10 days.   He has verbalized his understanding.   Please start on Paxlovid, dose adjusted for renal function:  Nirmatrelvir 150 mg + Ritonavir 100 mg both twice daily for 5 days.  No significant drug interctions (should continue the apixaban as prescribed).  Warn about possible abnormal taste and increased frequency of BMs as side effects.

## 2021-03-12 NOTE — Telephone Encounter (Signed)
I contacted patient, advised him to monitor his symptoms he did not go into detail of the symptoms he was having, if any. Patient was advised to let us know if symptoms worsen, and to quarantine for the time suggested.  Patient verbalized understanding. Will route to Dr.C to make aware.

## 2021-03-12 NOTE — Telephone Encounter (Signed)
    pt. is calling, he was tested positive to covid, he got it from Dr. Loletha Grayer. He would like to know what he needs to do

## 2021-04-15 ENCOUNTER — Ambulatory Visit (INDEPENDENT_AMBULATORY_CARE_PROVIDER_SITE_OTHER): Payer: No Typology Code available for payment source | Admitting: Pharmacist

## 2021-04-15 ENCOUNTER — Other Ambulatory Visit: Payer: Self-pay

## 2021-04-15 VITALS — BP 92/64 | HR 75 | Resp 17 | Ht 72.0 in | Wt 187.2 lb

## 2021-04-15 DIAGNOSIS — I5022 Chronic systolic (congestive) heart failure: Secondary | ICD-10-CM

## 2021-04-15 NOTE — Patient Instructions (Addendum)
It was nice meeting you today  We would like your blood pressure to stay less than 130/80  Continue your metoprolol '50mg'$  daily Continue your Entresto 49/'51mg'$  twice a day Continue your furosemide '40mg'$  every other day  You can restart your Jardiance '10mg'$  once a day  We will recheck your labwork today and contact you with the results  Karren Cobble, PharmD, BCACP, [place], [place] [id number] [place], [place] [address] Phone: [phone]; Fax: [phone] 04/15/2021 3:31 PM

## 2021-04-15 NOTE — Progress Notes (Signed)
Patient ID: CAMBRYN LEAZENBY                 DOB: 1961/07/12                      MRN: [id number]     HPI: Cody Joyce is a 60 y.o. male referred by Dr. Sallyanne Kuster to pharmacy clinic for HF medication management. PMH is significant for PE, PFO, CHF, NSTEMI, a flutter, COPD, CKD and a history of alcohol dependence. Most recent LVEF 40-45% on 03/12/20.  Patient tries to receive medications from [facility]: [facility] but this is occasionally time consuming.  Today he arrives to pharmacy clinic for further medication titration. At last visit with Dr Gale Journey was increased to 49-'51mg'$  BID. Symptomatically, he is feeling well, denies dizziness, lightheadedness, and fatigue. denies chest pain or palpitations. .Able to complete all ADLs. Activity level normal but patient does have history of bradycardia.  Has been prescribed Jardiance for CHF, DM, and CKD benefit but has not started yet.  Is due for lab work since increase in [place].  Current CHF meds:  Entresto 49-'51mg'$  BID Toprol XL '50mg'$  daily Furosemide '40mg'$  every other day  BP goal: <130/80  Wt Readings from Last 3 Encounters:  03/04/21 191 lb (86.6 kg)  03/02/21 197 lb (89.4 kg)  01/05/21 197 lb (89.4 kg)   BP Readings from Last 3 Encounters:  03/04/21 138/64  03/02/21 117/67  01/05/21 130/70   Pulse Readings from Last 3 Encounters:  03/04/21 (!) 52  03/02/21 (!) 42  01/05/21 (!) 49    Renal function: CrCl cannot be calculated (Patient's most recent lab result is older than the maximum 21 days allowed.).  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    PFO (patent foramen ovale) 01/03/2020   Pulmonary embolism ([place]) 12/29/2019   Renal infarct [facility])     Current Outpatient Medications on File Prior to Visit  Medication Sig Dispense Refill   acetaminophen (TYLENOL) 500 MG tablet Take 1,000 mg by mouth every 6 (six) hours as needed for headache (pain).     albuterol (VENTOLIN HFA) 108 (90 Base) MCG/ACT inhaler Inhale 2 puffs into the lungs 4 (four) times daily.     apixaban (ELIQUIS) 2.5 MG TABS tablet Take 1 tablet (2.5 mg total) by mouth 2 (two) times daily. 180 tablet 1   Cholecalciferol 50 MCG (2000 UT) TABS Take 1 tablet by mouth daily.     cyclobenzaprine (FLEXERIL) 10 MG tablet Take 1 tablet by mouth as needed.     diclofenac Sodium (VOLTAREN) 1 % GEL Apply 1 application topically 3 (three) times daily as needed (knee pain).     empagliflozin (JARDIANCE) 10 MG TABS tablet Take 1 tablet (10 mg total)  by mouth daily before breakfast. 90 tablet 3   furosemide (LASIX) 40 MG tablet Take 1 tablet (40 mg total) by mouth every other day. 15 tablet 3   gabapentin (NEURONTIN) 300 MG capsule Take 1 capsule by mouth as needed.     LIDOCAINE EX Apply 1 application topically daily as needed (pain).     metoprolol succinate (TOPROL-XL) 50 MG 24 hr tablet Take 1 tablet (50 mg total) by mouth daily. 30 tablet 5   Multiple Vitamin (MULTIVITAMIN WITH MINERALS) TABS tablet Take 1 tablet by mouth daily.     omeprazole (PRILOSEC) 20 MG capsule Take 20 mg by mouth 2 (two) times daily before a meal.     sacubitril-valsartan (ENTRESTO) 49-51 MG Take 1 tablet by mouth 2 (two)  times daily. 60 tablet 6   sildenafil (VIAGRA) 100 MG tablet Take 1 tablet by mouth daily.     Current Facility-Administered Medications on File Prior to Visit  Medication Dose Route Frequency Provider Last Rate Last Admin   sodium chloride flush (NS) 0.9 % injection 10 mL  10 mL Intravenous PRN Croitoru, Mihai, MD   20 mL at 05/28/20 1540    No Known Allergies   Assessment/Plan:  1. CHF -  Patient BP in room 98/66, rechecked at 92/64 which is below goal of <130/80. Concern regarding hypotension but patient is asymptomatic.    Recommended patient continue to follow a low salt diet and weight himself at home. Call clinic if he gains more than 3# overnight or 5# in a week.  Patient had not started Jardiance yet. Encouraged him to begin taking '10mg'$  in the morning due to multiple benefits in his disease states.  Ptient voiced understanding.  Difficult to titrate Entresto or metoprolol due to soft BP and low pulse rate.  Due for labs today.  Continue Entresto 49-'51mg'$  BID Continue Toprol '50mg'$  daily Continue furosemide '40mg'$  PRN Start Jardiance '10mg'$  daily Check BMP  Karren Cobble, PharmD, BCACP, CDCES, [place] [id number], [place], [place] [address] Phone: [phone]; Fax: [phone]

## 2021-04-16 ENCOUNTER — Encounter: Payer: Self-pay | Admitting: *Deleted

## 2021-04-16 LAB — BASIC METABOLIC PANEL
BUN/Creatinine Ratio: 14 (ref 10–24)
BUN: 22 mg/dL (ref 8–27)
CO2: 18 mmol/L — ABNORMAL LOW (ref 20–29)
Calcium: 8.9 mg/dL (ref 8.6–10.2)
Chloride: 108 mmol/L — ABNORMAL HIGH (ref 96–106)
Creatinine, Ser: 1.57 mg/dL — ABNORMAL HIGH (ref 0.76–1.27)
Glucose: 80 mg/dL (ref 65–99)
Potassium: 5.1 mmol/L (ref 3.5–5.2)
Sodium: 141 mmol/L (ref 134–144)
eGFR: 50 mL/min/{1.73_m2} — ABNORMAL LOW (ref >59)

## 2021-05-18 ENCOUNTER — Ambulatory Visit: Payer: No Typology Code available for payment source

## 2021-07-17 ENCOUNTER — Ambulatory Visit (INDEPENDENT_AMBULATORY_CARE_PROVIDER_SITE_OTHER): Payer: 59 | Admitting: Cardiovascular Disease

## 2021-07-17 ENCOUNTER — Other Ambulatory Visit: Payer: Self-pay

## 2021-07-17 ENCOUNTER — Encounter: Payer: Self-pay | Admitting: Cardiovascular Disease

## 2021-07-17 VITALS — BP 122/76 | HR 68 | Ht 72.0 in | Wt 187.2 lb

## 2021-07-17 DIAGNOSIS — I483 Typical atrial flutter: Secondary | ICD-10-CM

## 2021-07-17 DIAGNOSIS — Q2112 Patent foramen ovale: Secondary | ICD-10-CM | POA: Diagnosis not present

## 2021-07-17 DIAGNOSIS — I5042 Chronic combined systolic (congestive) and diastolic (congestive) heart failure: Secondary | ICD-10-CM | POA: Diagnosis not present

## 2021-07-17 DIAGNOSIS — Z86711 Personal history of pulmonary embolism: Secondary | ICD-10-CM | POA: Diagnosis not present

## 2021-07-17 DIAGNOSIS — I87009 Postthrombotic syndrome without complications of unspecified extremity: Secondary | ICD-10-CM

## 2021-07-17 DIAGNOSIS — E119 Type 2 diabetes mellitus without complications: Secondary | ICD-10-CM

## 2021-07-17 DIAGNOSIS — N1831 Chronic kidney disease, stage 3a: Secondary | ICD-10-CM

## 2021-07-17 MED ORDER — FUROSEMIDE 40 MG PO TABS: ORAL_TABLET | ORAL | 5 refills | Status: DC

## 2021-07-17 MED ORDER — POTASSIUM CHLORIDE ER 10 MEQ PO TBCR: EXTENDED_RELEASE_TABLET | ORAL | 5 refills | Status: DC

## 2021-07-17 NOTE — Patient Instructions (Addendum)
Medication Instructions:  Furosemide: Take one 40 mg tablet as needed for a weight gain of 3 pounds in one day or 5 pounds in a week.   Potassium: Take one tablet as needed when you take a Furosemide  *If you need a refill on your cardiac medications before your next appointment, please call your pharmacy*   Lab Work: None ordered If you have labs (blood work) drawn today and your tests are completely normal, you will receive your results only by: [facility] [place] (if you have MyChart) OR A paper copy in the mail If you have any lab test that is abnormal or we need to change your treatment, we will call you to review the results.   Testing/Procedures: None ordered   Follow-Up: At [facility], you and your health needs are our priority.  As part of our continuing mission to provide you with exceptional heart care, we have created designated Provider Care Teams.  These Care Teams include your primary Cardiologist (physician) and Advanced Practice Providers (APPs -  Physician Assistants and Nurse Practitioners) who all work together to provide you with the care you need, when you need it.  We recommend signing up for the patient portal called "MyChart".  Sign up information is provided on this After Visit Summary.  MyChart is used to connect with patients for Virtual Visits (Telemedicine).  Patients are able to view lab/test results, encounter notes, upcoming appointments, etc.  Non-urgent messages can be sent to your provider as well.   To learn more about what you can do with MyChart, go to [email or web address].    Your next appointment:   6 month(s)  The format for your next appointment:   In Person  Provider:   Sanda Klein, MD

## 2021-07-17 NOTE — Progress Notes (Signed)
Cardiology Office Note:    Date:  07/17/2021   ID:  Cody Joyce, DOB 06/24/1961, MRN [id number]  PCP:  [facility], [facility]  [facility] Cardiologist:  Sanda Klein, MD  [place] Electrophysiologist:  None   Referring MD: [facility], [facility]   CC: CHF. History of DVT/PE.  History of Present Illness:    Cody Joyce is a 60 y.o. 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   Simcha feels great.  He does not feel limited by physical symptoms at all.  He is lifting weights and has actually strained his left arm and shoulder.  He is doing yard work and household chores without any limitations.  He can run up a flight of stairs without dyspnea.  He occasionally has ankle swelling, but has not required any adjustment in his dose of diuretics.  He has occasional orthostatic dizziness.  He complains about increased urine output after taking his medications.  He has not had any falls, injuries or serious bleeding problems.  He has not had any palpitations.  He has not had new episodes of calf swelling or tenderness, cough, hemoptysis or pleuritic chest pain.  He is currently receiving empagliflozin, metoprolol succinate maximal tolerated dose (higher doses caused junctional rhythm) and Entresto on maximum tolerated dose (higher doses caused symptomatic hypotension).  We have not started aldosterone antagonist since his creatinine is around 1.6.  His most recent echocardiogram showed improved LVEF to 40-45%.  He continues to write poetry.  It seems to me that this is an excellent way for him to dispel his frustrations with life and our contemporary society. He started writing poetry and his self publishing a book which he  hopes to sell on [employer].  L/R cath on 01/02/2020 showed normal coronary arteries. TEE during hospitalization on 01/03/2020 showed EF of 30-35% with moderated reduced left ventricular function with global hypokinesis. The right ventricle function was severely reduced with severe enlargement. Biatrial enlargement noted. AV and MV without stenosis or regurg. Agitated bubble study with findings of large PFO with bidirectional shunting.   Repeat outpatient TTE on 03/12/2020 showed improvement in EF to 40-45% with continued left ventricular dysfunction and global hypokinesis. Right ventricular systolic function has improved to normal. MV appears normal. Left venous doppler obtained on the same day shows chronic left popliteal and common femoral DVTs.   In July 2021, he was hospitalized and treated for hypoxia while traveling to [facility].  His records states that he required 4 L nasal cannula oxygen because he would desaturate to the low 80% range on room air.  CT of the chest did not show evidence of new pulmonary embolism, but showed "groundglass" opacities in the lower lobes. He reportedly had 3 negative Covid test (see documentation of at least 1 of these).  He was treated for Covid nonetheless.  An extensive panel of respiratory pathogen test was also negative (including Legionella, RSV, flu etc).  During that same hospitalization his white blood cell count was elevated at 17K.  Procalcitonin was elevated of 0.2 and CRP was markedly elevated at 29.  His renal function was markedly improved from the values we have recorded in [place] with a creatinine of 1.2.  Following that hospitalization he  had some troubles with epigastric discomfort and small amounts of hematochezia.  He went to the emergency room in [place] after returning from [place] with abdominal pain.  He had a CT of the chest and abdomen/pelvis that showed normal findings.  Creatinine was elevated at 1.8.  He was discharged from the emergency  room and has been feeling well since then.  Another follow-up echocardiogram 05/28/2020 still showed LVEF 40-45% and there was no evidence of residual shunting across the atrial septum, but either color Doppler or saline contrast study.  Past Medical History:  Diagnosis Date   Acute deep vein thrombosis (DVT) of left lower extremity ([place]) 01/03/2020   Acute pulmonary embolism with acute cor pulmonale (HCC) 10/03/7865   Acute systolic CHF (congestive heart failure) ([place]) 01/03/2020   Alcohol dependence ([place]) 07/25/2018   Aortic atherosclerosis (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    PFO (patent foramen ovale) 01/03/2020   Pulmonary embolism ([place]) 12/29/2019   Renal infarct [facility])     Past Surgical History:  Procedure Laterality Date   BUBBLE STUDY  01/03/2020   Procedure: BUBBLE STUDY;  Surgeon: [name], MD;  Location: [facility];  Service: Cardiovascular;;   IR ANGIOGRAM PULMONARY BILATERAL SELECTIVE  12/30/2019   IR ANGIOGRAM SELECTIVE EACH ADDITIONAL VESSEL  12/30/2019   IR ANGIOGRAM SELECTIVE EACH ADDITIONAL VESSEL  12/30/2019   IR THROMBECT PRIM MECH ADD (INCLU) MOD SED  12/30/2019   IR THROMBECT PRIM MECH INIT (INCLU) MOD SED  12/30/2019   IR US GUIDE VASC ACCESS RIGHT  12/30/2019   RIGHT/LEFT HEART CATH AND CORONARY ANGIOGRAPHY N/A 01/02/2020   Procedure: RIGHT/LEFT HEART CATH AND CORONARY ANGIOGRAPHY;  Surgeon: [name], MD;  Location: [facility];  Service: Cardiovascular;  Laterality: N/A;   TEE  WITHOUT CARDIOVERSION N/A 01/03/2020   Procedure: TRANSESOPHAGEAL ECHOCARDIOGRAM (TEE)   DEFINITY ;  Surgeon: [name], MD;  Location: [facility] ENDOSCOPY;  Service: Cardiovascular;  Laterality: N/A;    Current Medications: Current Meds  Medication Sig   acetaminophen (TYLENOL) 500 MG tablet Take 1,000 mg by mouth every 6 (six) hours as needed for headache (pain).   apixaban (ELIQUIS) 2.5 MG TABS tablet Take 1 tablet (2.5 mg total) by mouth 2 (two) times daily.   Cholecalciferol 50 MCG (2000 UT) TABS Take 1 tablet by mouth daily.   cyclobenzaprine (FLEXERIL) 10 MG tablet Take 1 tablet by mouth as needed.   diclofenac Sodium (VOLTAREN) 1 % GEL Apply 1 application topically 3 (three) times daily as needed (knee pain).   gabapentin (NEURONTIN) 300 MG capsule Take 1 capsule by mouth as needed.   LIDOCAINE EX Apply 1 application topically daily as needed (pain).   metoprolol succinate (TOPROL-XL) 50 MG 24 hr tablet Take 1 tablet (50 mg total) by mouth daily.   Multiple Vitamin (MULTIVITAMIN WITH MINERALS) TABS tablet Take 1 tablet by mouth daily.   omeprazole (PRILOSEC) 20 MG capsule Take 20 mg by mouth 2 (two) times daily before a meal.   sacubitril-valsartan (ENTRESTO) 49-51 MG Take 1  tablet by mouth 2 (two) times daily.   sildenafil (VIAGRA) 100 MG tablet Take 1 tablet by mouth daily.   [DISCONTINUED] furosemide (LASIX) 40 MG tablet Take 1 tablet (40 mg total) by mouth every other day.   [DISCONTINUED] potassium chloride (KLOR-CON) 10 MEQ tablet Take 10 mEq by mouth daily.     Allergies:   Patient has no known allergies.   Social History   Socioeconomic History   Marital status: Married    Spouse name: Not on file   Number of children: 0   Years of education: Not on file   Highest education level: Not on file  Occupational History   Occupation: self employed  Tobacco Use   Smoking status: Former    Types: Cigarettes    Quit date: 01/05/2006    Years since quitting: 15.5    Smokeless tobacco: Never  Vaping Use   Vaping Use: Never used  Substance and Sexual Activity   Alcohol use: Yes    Alcohol/week: 6.0 standard drinks    Types: 2 Glasses of wine, 4 Cans of beer per week    Comment: 0-2 per day   Drug use: No   Sexual activity: Yes  Other Topics Concern   Not on file  Social History Narrative   Marital status:  Married x 3 years; not happily married.      Children:  None      Lives: alone.      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      Tobacco: smoke cigars 2 per day.  Quit cigarettes in 2001; smoked x 15 years.      Alcohol:  Weekends 4 beers per week; 2 drinks per week.      Drugs:  None      Exercising:  Three days per week; active job.        Seatbelt:  100%      Guns:  None      Sexual activity: condoms; Gonorrhea in high school.  One partner in past year.  Last STD screening 2013.   Social Determinants of Health   Financial Resource Strain: Not on file  Food Insecurity: Not on file  Transportation Needs: Not on file  Physical Activity: Not on file  Stress: Not on file  Social Connections: Not on file    Family History: The patient's *family history includes Glaucoma in his father and mother; Heart disease in his father; Ovarian cancer in his cousin; Pancreatic cancer in his sister. There is no history of Colon cancer, Colitis, Esophageal cancer, Rectal cancer, or Stomach cancer.  ROS:   Please see the history of present illness.    All other systems reviewed and are negative.  EKGs/Labs/Other Studies Reviewed:    The following studies were reviewed today:  TEE (01/03/2020)  1. There is a smoke in the left ventricular apex with no evidence for a  thrombus on Definity echo contrast images.. Left ventricular ejection  fraction, by estimation, is 30 to 35%. The left ventricle has moderately decreased function. The left ventricle demonstrates global hypokinesis. There is the interventricular septum is flattened in  systole and diastole, consistent with right ventricular pressure and volume overload.   2. Right ventricular systolic function is severely reduced. The right  ventricular size is severely enlarged. There is normal pulmonary artery systolic pressure.   3. Left atrial size was moderately dilated. No left atrial/left atrial appendage thrombus was detected.   4. Right atrial size was  moderately dilated.   5. The mitral valve is normal in structure. Mild mitral valve regurgitation. No evidence of mitral stenosis.   6. The aortic valve is normal in structure. Aortic valve regurgitation is not visualized. No aortic stenosis is present.   7. The inferior vena cava is normal in size with greater than 50% respiratory variability, suggesting right atrial pressure of 3 mmHg.   8. Agitated saline contrast bubble study was significantly positive with shunting observed within the first cardiac cycles suggestive of  interatrial shunt. There is a large patent foramen ovale with bidirectional shunting across atrial septum.   Conclusion(s)/Recommendation(s): Findings are concerning for an  interatrial shunt as detailed above.   TTE (03/12/2020)  1. Left ventricular ejection fraction, by estimation, is 40 to 45%. The  left ventricle has mildly decreased function. The left ventricle demonstrates global hypokinesis. Left ventricular diastolic parameters are indeterminate. The average left ventricular global longitudinal strain is -14.0 %.   2. Right ventricular systolic function is normal. The right ventricular size is normal. There is normal pulmonary artery systolic pressure.   3. Left atrial size was mildly dilated.   4. The mitral valve is normal in structure. Trivial mitral valve regurgitation.   5. The aortic valve is tricuspid. Aortic valve regurgitation is not visualized. No aortic stenosis is present.   6. The inferior vena cava is normal in size with greater than 50% respiratory variability, suggesting right  atrial pressure of 3 mmHg.   Conclusion(s)/Recommendation(s): EF reduced, though visually and by  measurement appears slightly improved compared to prior. RV appears  normal, normal RVSP. Mitral valve appears normal, previously noted as  myxomatous. Strain mildly and diffusely abnormal   throughout LV.   Echo 05/28/2020  1. Left ventricular ejection fraction, by estimation, is 40 to 45%. The  left ventricle has mildly decreased function. The left ventricle  demonstrates global hypokinesis. Left ventricular diastolic parameters are  consistent with Grade I diastolic  dysfunction (impaired relaxation).   2. Right ventricular systolic function is normal. The right ventricular  size is normal. Tricuspid regurgitation signal is inadequate for assessing  PA pressure.   3. The mitral valve is normal in structure. No evidence of mitral valve  regurgitation. No evidence of mitral stenosis.   4. The aortic valve is tricuspid. Aortic valve regurgitation is not  visualized. No aortic stenosis is present.   5. The inferior vena cava is normal in size with greater than 50%  respiratory variability, suggesting right atrial pressure of 3 mmHg.   6. Negative bubble study, no evidence for PFO or ASD.   EKG:  EKG is not ordered today.    Recent Labs: 04/15/2021: BUN 22; Creatinine, Ser 1.57; Potassium 5.1; Sodium 141   Recent Lipid Panel No results found for: CHOL, HDL, LDLCALC, LDLDIRECT, TRIG, CHOLHDL Labs checked at [place].  Physical Exam:    VS:  BP 122/76 (BP Location: Left Arm, Patient Position: Sitting, Cuff Size: Normal)   Pulse 68   Ht 6' (1.829 m)   Wt 187 lb 3.2 oz (84.9 kg)   SpO2 97%   BMI 25.39 kg/m     Wt Readings from Last 3 Encounters:  07/17/21 187 lb 3.2 oz (84.9 kg)  04/15/21 187 lb 3.2 oz (84.9 kg)  03/04/21 191 lb (86.6 kg)      General: Alert, oriented x3, no distress, appears younger than stated age, fit, relatively lean Head: no evidence of trauma, PERRL, EOMI, no  exophtalmos or lid lag, no  myxedema, no xanthelasma; normal ears, nose and oropharynx Neck: normal jugular venous pulsations and no hepatojugular reflux; brisk carotid pulses without delay and no carotid bruits Chest: clear to auscultation, no signs of consolidation by percussion or palpation, normal fremitus, symmetrical and full respiratory excursions Cardiovascular: normal position and quality of the apical impulse, regular rhythm, normal first and second heart sounds, no murmurs, rubs or gallops Abdomen: no tenderness or distention, no masses by palpation, no abnormal pulsatility or arterial bruits, normal bowel sounds, no hepatosplenomegaly Extremities: no clubbing, cyanosis or edema; 2+ radial, ulnar and brachial pulses bilaterally; 2+ right femoral, posterior tibial and dorsalis pedis pulses; 2+ left femoral, posterior tibial and dorsalis pedis pulses; no subclavian or femoral bruits Neurological: grossly nonfocal Psych: Normal mood and affect     ASSESSMENT:    1. Chronic combined systolic and diastolic heart failure ([place])   2. History of pulmonary embolism   3. Postphlebitic syndrome   4. PFO (patent foramen ovale)   5. Typical atrial flutter (HCC)   6. Stage 3a chronic kidney disease ([place])   7. Controlled type 2 diabetes mellitus without complication, without long-term current use of insulin (HCC)      PLAN:    In order of problems listed above:  HFrEF: Nonischemic cardiomyopathy of uncertain mechanism, improved with treatment with Entresto and beta-blockers (dose is limited by hypotension and junctional rhythm respectively).  Also on SGLT2 inhibitor.  Not on aldosterone tiredness due to renal dysfunction.  NYHA functional class I, euvolemic without need for diuretic dose adjustment in a very long time.  He is very aware of the need to avoid excessive sodium in his diet.  He weighs himself on a daily basis.  I think we can try to change his furosemide and potassium to "as needed  only" rather than a daily medication.  I instructed him to continue daily weights and to take 1 dose of furosemide 40 mg and 1 dose of potassium chloride 10 mEq on occasions when his weight increases by 3 pounds in 24 hours or 5 pounds in 1 week.  If this does not bring his weight back down to baseline levels he should call the office. Submassive PE with chronic DVT: Currently asymptomatic, marked improvement in his breathing.  Now on prophylactic dose anticoagulation.  Since his DVT/PE was unprovoked and was extremely dangerous, would recommend lifelong anticoagulation. Postphlebitic syndrome: He has a chronic left leg DVT, but he has no swelling at all today even though he has stopped wearing compression stockings. PFO/ASD: No shunting was seen on his most recent echocardiogram, either by Doppler or saline contrast injection.  He likely popped open a PFO due to sudden marked increase in right heart pressures when he had his pulmonary embolism. Atrial Flutter: No recurrence of this clinically, since the initial presentation with pulmonary embolism and right heart strain.  This was likely triggered by right heart strain during pulmonary embolism .  [id number] (embolic event, LV dysfunction), but unless the arrhythmia recurs, we will keep him on prophylactic dose anticoagulation only. Junctional rhythm: Occurred when we used doses of metoprolol higher than 50 mg daily. 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 DM: Vania Rea is an excellent choice since it treats not only his hyperglycemia, but also protects renal function and treats his heart failure.  Most recent hemoglobin A1c was 6.3% (gets his care for diabetes at the [facility]).     Sanda Klein, MD, [place] [phone] 07/17/2021, 3:00 PM

## 2021-10-26 IMAGING — CT CT ANGIO CHEST
2 of 6 series · 18 of 36 positions shown · IV contrast (omnipaque)
Comparison: None.

CLINICAL DATA: Short of breath, right-sided chest and rib pain,
recent history of trauma

EXAM:
CT ANGIOGRAPHY CHEST WITH CONTRAST
TECHNIQUE: Multidetector CT imaging of the chest was performed using the
standard protocol during bolus administration of intravenous
contrast. Multiplanar CT image reconstructions and MIPs were
obtained to evaluate the vascular anatomy.
CONTRAST:  100mL OMNIPAQUE IOHEXOL 350 MG/ML SOLN

[Series 7: pe thins · axial · 0.71mm/px · z∈[-310,-18]mm · 17 of 465 slices shown]
[im 24/465  lung]
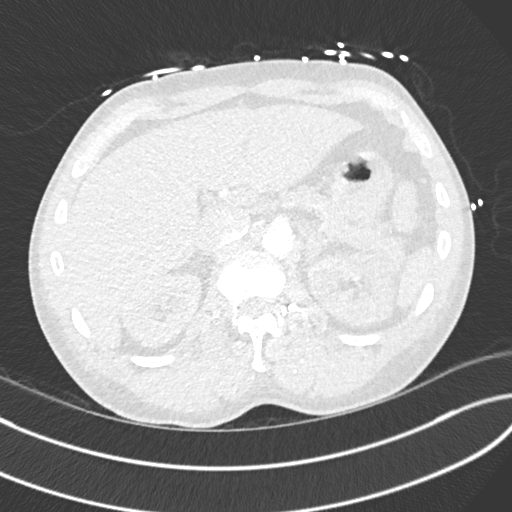
[im 47/465  mediastinal]
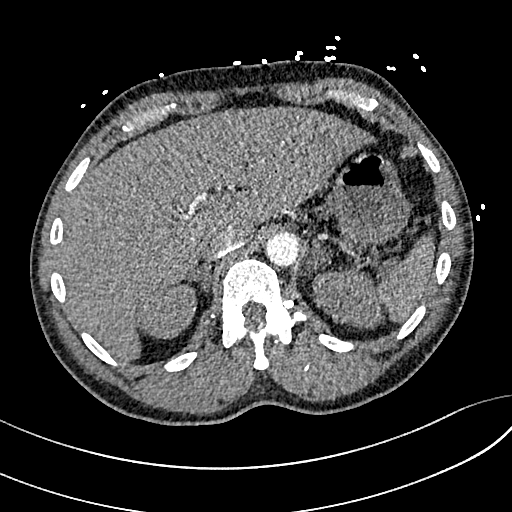
[im 70/465  lung]
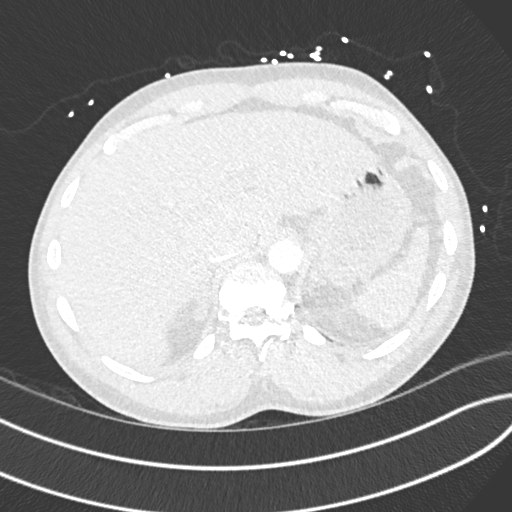
[im 93/465  mediastinal]
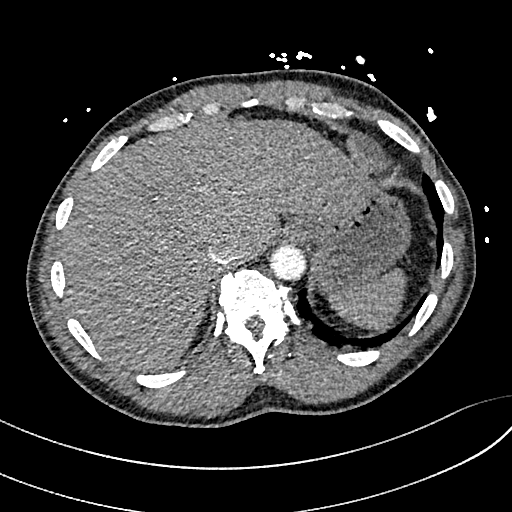
[im 140/465  lung]
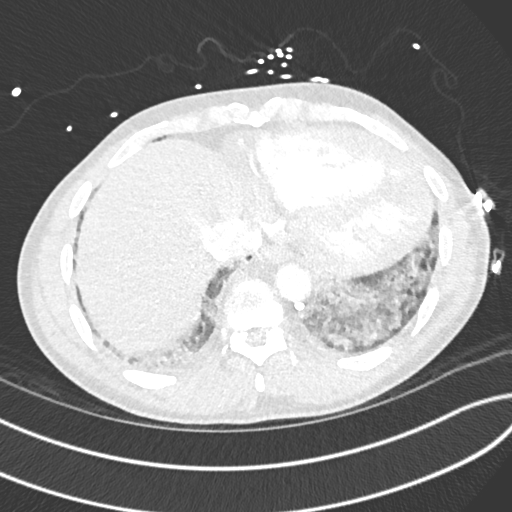
[im 163/465  mediastinal]
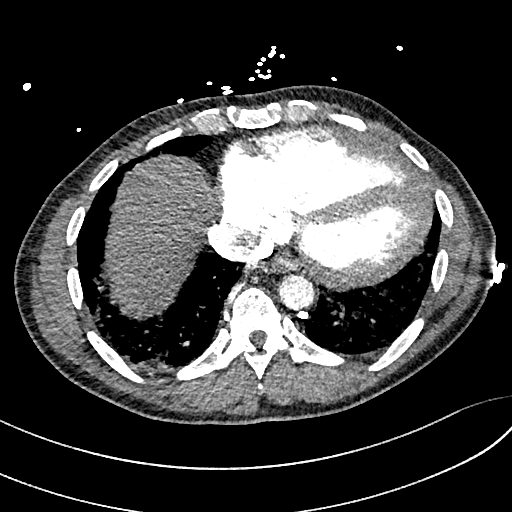
[im 186/465  lung]
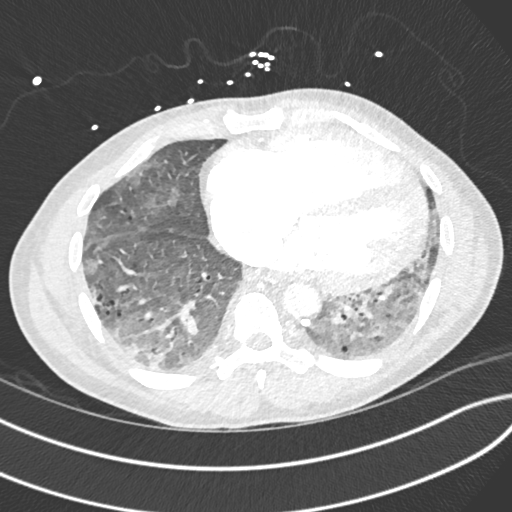
[im 209/465  mediastinal]
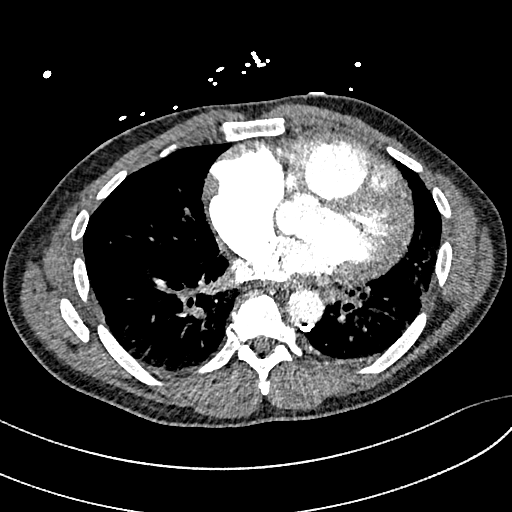
[im 233/465  lung]
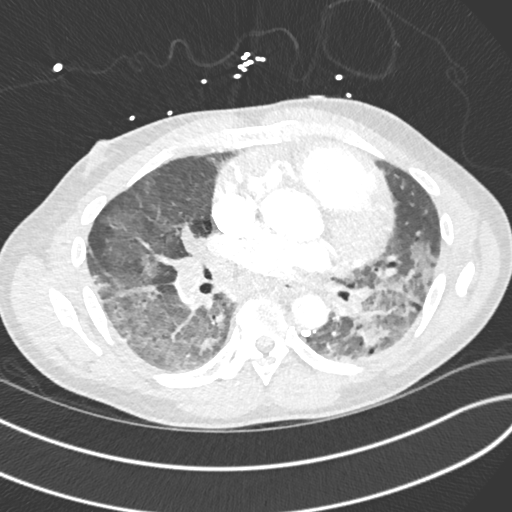
[im 256/465  mediastinal]
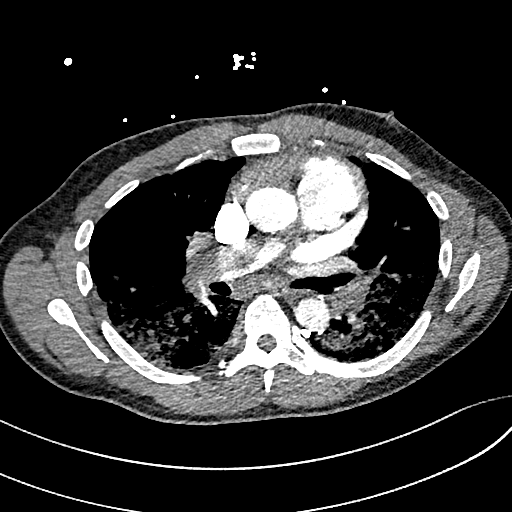
[im 279/465  lung]
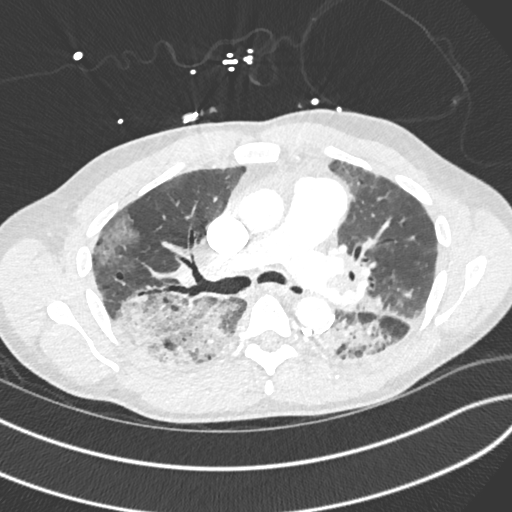
[im 302/465  mediastinal]
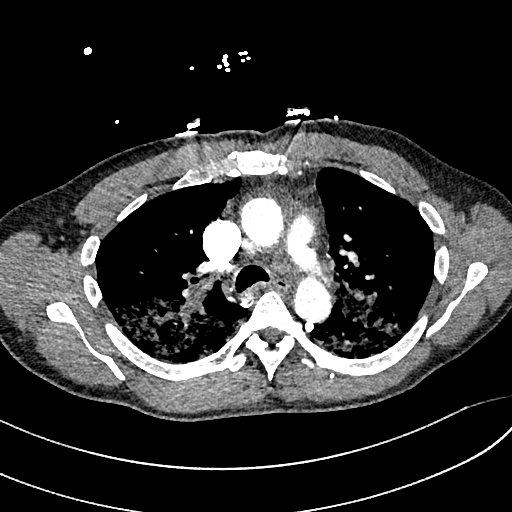
[im 325/465  lung]
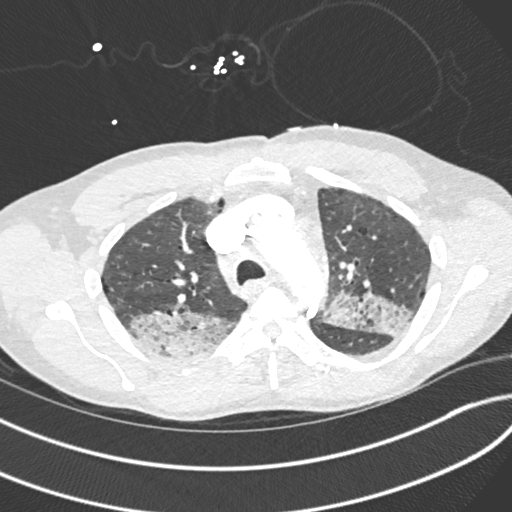
[im 372/465  mediastinal]
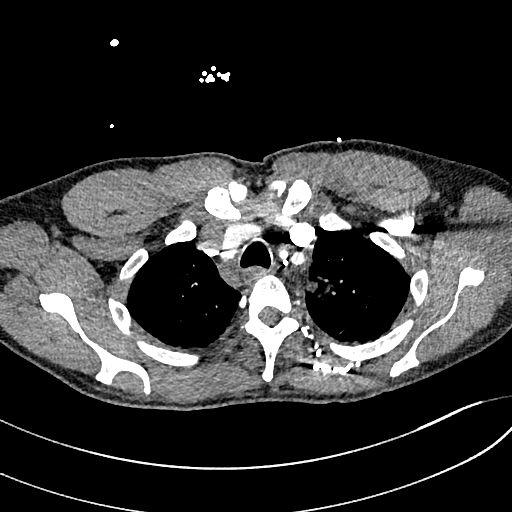
[im 395/465  lung]
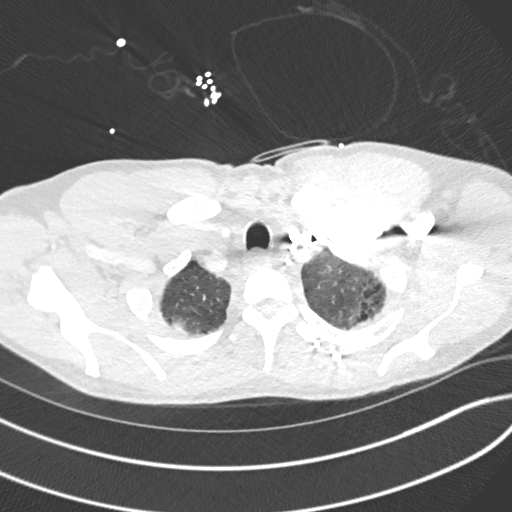
[im 418/465  mediastinal]
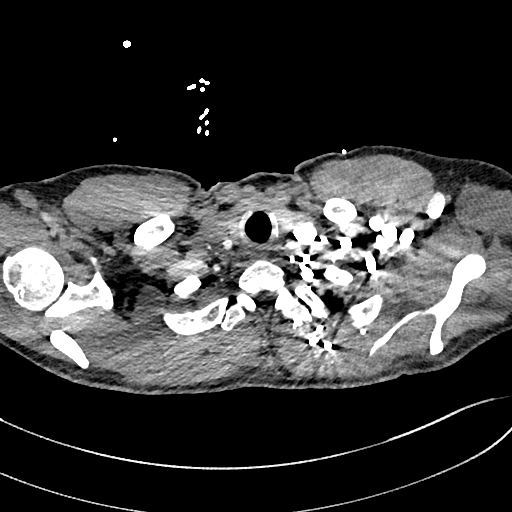
[im 441/465  lung]
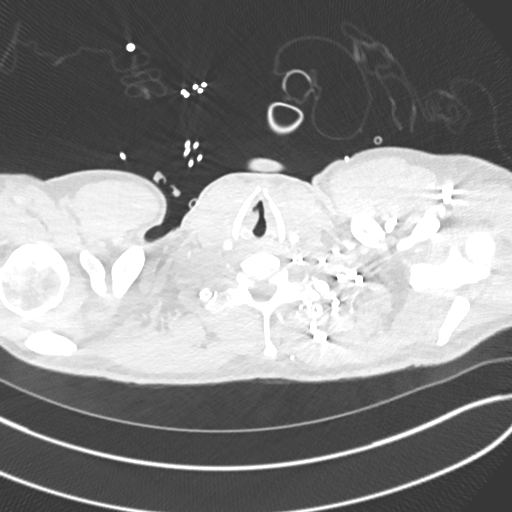

[Series 8: pe 2mm cor · coronal · 0.63mm/px · 1 of 150 slices shown]
[im 75/150  mediastinal]
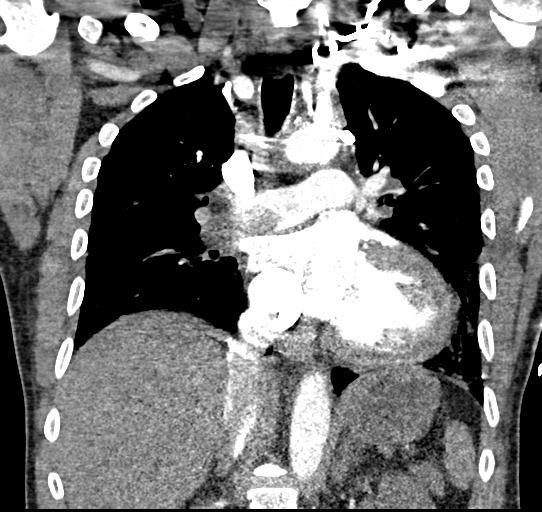

[18 of 36 positions shown; findings below may reference images not displayed]

FINDINGS: Cardiovascular: This is a technically adequate evaluation of the
pulmonary vasculature. There are large bilateral pulmonary emboli
filling the main pulmonary arteries. There is straightening of the
interventricular septum and mild dilation of the right ventricle,
with RV/LV ratio measuring 1.3.

Along the nondependent surface of the right ventral, actually better
visualized on the corresponding abdominal CT, there is likely a here
mural thrombus. Echocardiography may be useful for further
evaluation.

No pericardial effusion. The thoracic aorta is normal in caliber
without aneurysm or dissection.

Mediastinum/Nodes: No enlarged mediastinal, hilar, or axillary lymph
nodes. Thyroid gland, trachea, and esophagus demonstrate no
significant findings.

Lungs/Pleura: There is background emphysema. Bilateral areas of
airspace disease are seen within the dependent upper lobes and
superior segment of the bilateral lower lobes. Ground-glass
opacities are seen within the dependent lower lobes. No effusion or
pneumothorax. Central airways are patent.

Upper Abdomen: No acute abnormality.

Musculoskeletal: No acute or destructive bony lesions. Reconstructed
images demonstrate no additional findings.

Review of the MIP images confirms the above findings.
IMPRESSION: 1. Large bilateral pulmonary emboli with CT evidence of right heart
strain (RV/LV Ratio = 1.3) consistent with at least submassive
(intermediate risk) PE. The presence of right heart strain has been
associated with an increased risk of morbidity and mortality.
2. Likely adherent mural thrombus along the anterior wall the right
ventricle, better visualized on the corresponding abdominal CT.
3. Bilateral areas of consolidation and ground-glass airspace
disease, superimposed upon background emphysema. Findings favor
pulmonary edema with developing pulmonary infarctions.
4.  Emphysema ([id number]).

These results were called by telephone at the time of interpretation
on 12/29/2019 at [DATE] to provider Dr. Margarito, who verbally
acknowledged these results.

## 2021-11-03 IMAGING — DX DG CHEST 1V PORT
1 series · 1 of 1 positions shown · non-contrast
Comparison: 12/29/2019

CLINICAL DATA: Tachypnea

EXAM:
PORTABLE CHEST 1 VIEW

[chest ap]
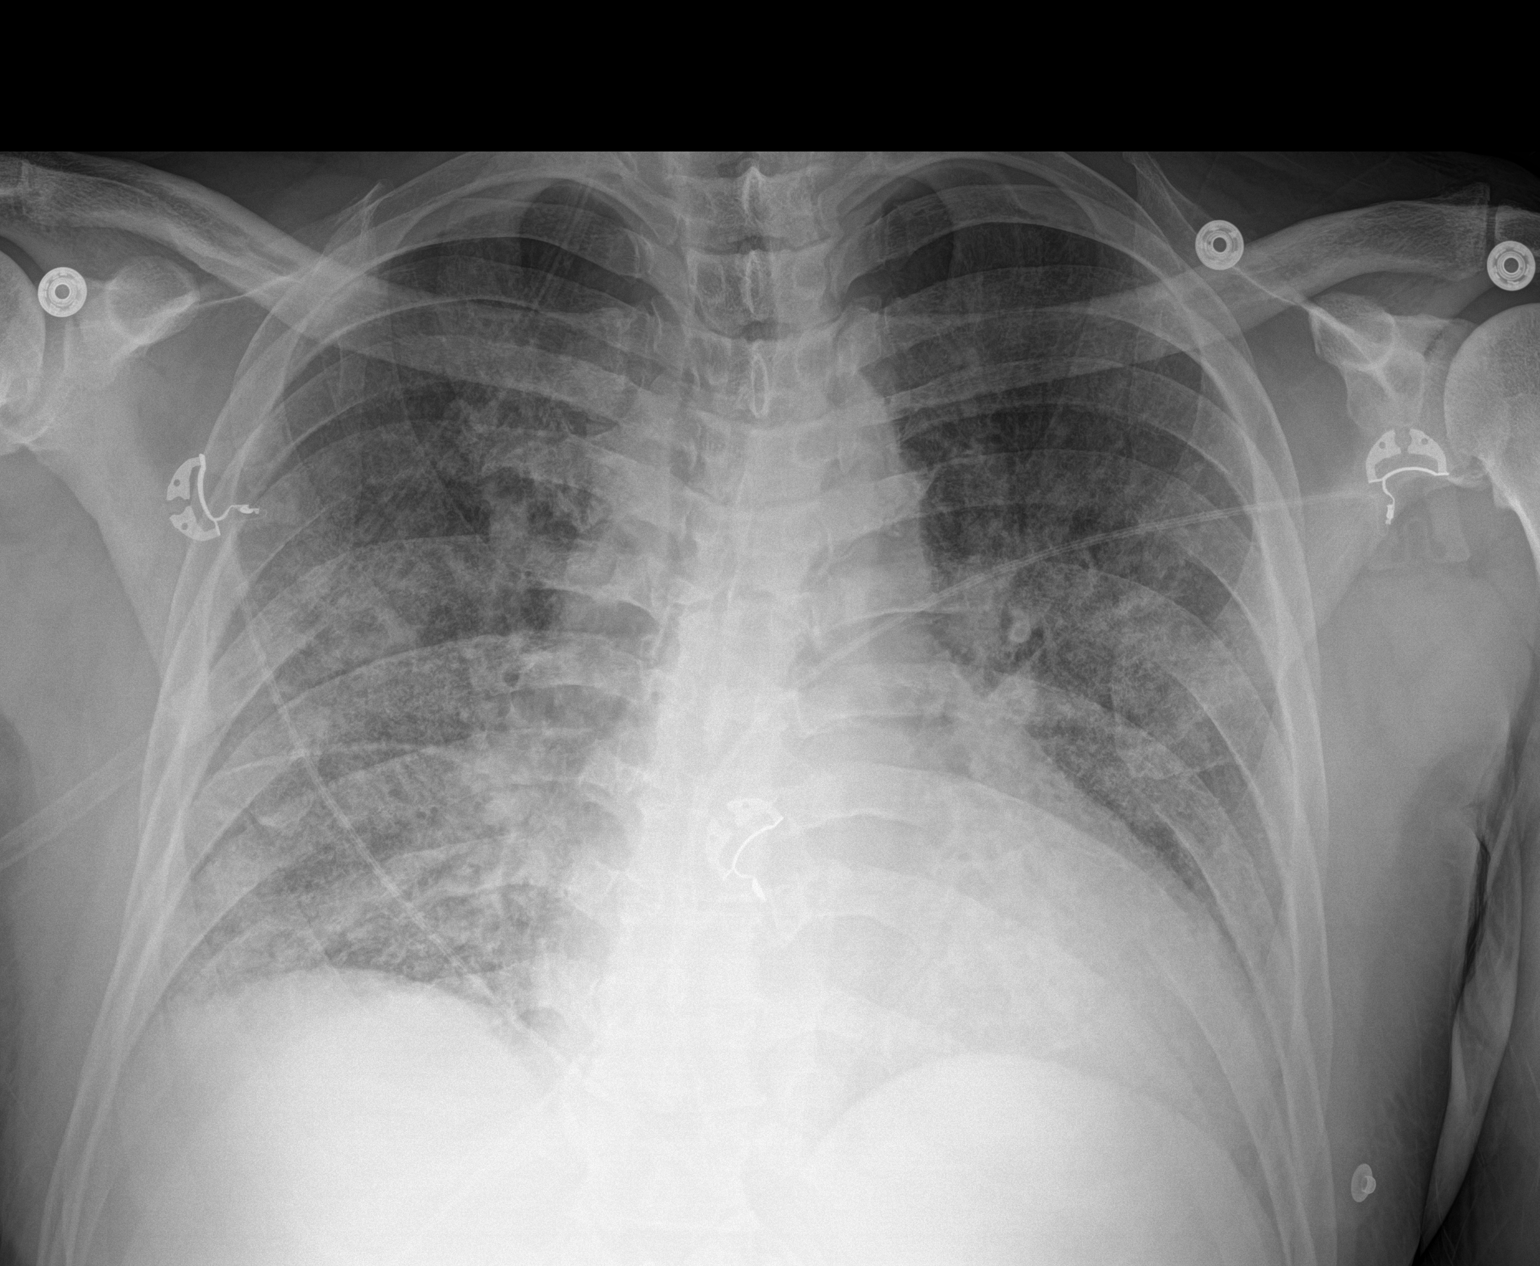

[1 of 1 positions shown; findings below may reference images not displayed]

FINDINGS: Mild bilateral interstitial pulmonary edema. No pleural effusion or
pneumothorax. No focal airspace consolidation. Mild cardiomegaly.
IMPRESSION: Mild interstitial pulmonary edema.

## 2022-02-24 ENCOUNTER — Telehealth: Payer: Self-pay | Admitting: Cardiovascular Disease

## 2022-02-24 MED ORDER — METOPROLOL SUCCINATE ER 50 MG PO TB24: 50.0000 mg | ORAL_TABLET | Freq: Every day | ORAL | 3 refills | Status: DC

## 2022-02-24 NOTE — Telephone Encounter (Signed)
*  STAT* If patient is at the pharmacy, call can be transferred to refill team.   1. Which medications need to be refilled? (please list name of each medication and dose if known)  metoprolol succinate (TOPROL-XL) 50 MG 24 hr tablet  2. Which pharmacy/location (including street and city if local pharmacy) is medication to be sent to?  [place], [place] - [address]  3. Do they need a 30 day or 90 day supply?  30 day supply

## 2022-03-16 ENCOUNTER — Telehealth: Payer: Self-pay | Admitting: Cardiovascular Disease

## 2022-03-16 NOTE — Telephone Encounter (Signed)
*  STAT* If patient is at the pharmacy, call can be transferred to refill team.   1. Which medications need to be refilled? (please list name of each medication and dose if known)   metoprolol succinate (TOPROL-XL) 50 MG 24 hr tablet  apixaban (ELIQUIS) 2.5 MG TABS tablet  sacubitril-valsartan (ENTRESTO) 49-51 MG   2. Which pharmacy/location (including street and city if local pharmacy) is medication to be sent to?  [place], [place] - [address]  3. Do they need a 30 day or 90 day supply?   30 day   Patient stated he is out of these medications and would need the prescriptions sent to the [place] in [place].   Patient has an appointment on 06/09/22.

## 2022-03-17 MED ORDER — APIXABAN 2.5 MG PO TABS: 2.5000 mg | ORAL_TABLET | Freq: Two times a day (BID) | ORAL | 1 refills | Status: DC

## 2022-03-17 MED ORDER — METOPROLOL SUCCINATE ER 50 MG PO TB24: 50.0000 mg | ORAL_TABLET | Freq: Every day | ORAL | 3 refills | Status: DC

## 2022-03-17 MED ORDER — SACUBITRIL-VALSARTAN 49-51 MG PO TABS: 1.0000 | ORAL_TABLET | Freq: Two times a day (BID) | ORAL | 6 refills | Status: DC

## 2022-03-22 ENCOUNTER — Telehealth: Payer: Self-pay | Admitting: Cardiovascular Disease

## 2022-03-22 NOTE — Telephone Encounter (Signed)
Pt c/o medication issue:  1. Name of Medication:  apixaban (ELIQUIS) 2.5 MG TABS tablet  2. How are you currently taking this medication (dosage and times per day)?   3. Are you having a reaction (difficulty breathing--STAT)?   4. What is your medication issue?   Patient would like to know if he should still be taking Eliquis. Please advise.

## 2022-03-22 NOTE — Telephone Encounter (Signed)
Contacted patient, LVM of message from [facility] to call back if questions or concerns.

## 2022-03-22 NOTE — Telephone Encounter (Signed)
Called patient- advised of last OV from Dr.Croitoru:   Submassive PE with chronic DVT: Currently asymptomatic, marked improvement in his breathing.  Now on prophylactic dose anticoagulation.  Since his DVT/PE was unprovoked and was extremely dangerous, would recommend lifelong anticoagulation.  Patient aware to continue the Eliquis. He would like to make sure the dosage should stay at 2.5 mg- I advised I would check on the dosage as requested.   Patient verbalized understanding, thankful for call back.

## 2022-03-22 NOTE — Telephone Encounter (Signed)
Yes dosage is correct for long term VTE ppx. Had hx of aflutter but no recurrence and per Dr C, pt to continue on lower ppx dose.

## 2022-06-09 ENCOUNTER — Encounter: Payer: Self-pay | Admitting: Cardiovascular Disease

## 2022-06-09 ENCOUNTER — Ambulatory Visit
Payer: No Typology Code available for payment source | Attending: Cardiovascular Disease | Admitting: Cardiovascular Disease

## 2022-06-09 VITALS — BP 138/81 | HR 49 | Ht 72.0 in | Wt 183.0 lb

## 2022-06-09 DIAGNOSIS — Z86711 Personal history of pulmonary embolism: Secondary | ICD-10-CM

## 2022-06-09 DIAGNOSIS — E119 Type 2 diabetes mellitus without complications: Secondary | ICD-10-CM

## 2022-06-09 DIAGNOSIS — Z8679 Personal history of other diseases of the circulatory system: Secondary | ICD-10-CM

## 2022-06-09 DIAGNOSIS — I5042 Chronic combined systolic (congestive) and diastolic (congestive) heart failure: Secondary | ICD-10-CM

## 2022-06-09 DIAGNOSIS — I825Z2 Chronic embolism and thrombosis of unspecified deep veins of left distal lower extremity: Secondary | ICD-10-CM

## 2022-06-09 DIAGNOSIS — N1832 Chronic kidney disease, stage 3b: Secondary | ICD-10-CM

## 2022-06-09 NOTE — Progress Notes (Signed)
[facility]   CREATININE,PLASMA (in mg/dL) 1.480  H 0.6-1.4         UREA NITROGEN 21    5-23        GLUCOSE 99    74-118        SODIUM 138    134.0-146.0        POTASSIUM 4.2    3.4-5.0        CHLORIDE 112  H 99-109        CO2 22    22.0-32.0        CALCIUM 8.7    8.5-10.5        eGFR_CKD 53  L See_Comment     Cardiology Office Note:    Date:  06/14/2022   ID:  Cody Joyce, DOB 22-Apr-1961, MRN [id number]  PCP:  [facility]  [facility] Cardiologist:  Sanda Klein, MD  [place] Electrophysiologist:  None   Referring MD: [facility]   CC: CHF. History of DVT/PE.  History of Present Illness:    Cody Joyce is a 61 y.o. 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   He continues to feel great.  He can perform heavy physical labor at a faster pace than others younger than he is.  He is try to do some jogging.  NYHA functional class I.  Denies leg pain or edema.  Has not had any chest pain at rest or with activity.  Denies dizziness, palpitations or syncope.  No focal neurological complaints.  Denies cough hemoptysis or pleurisy.  Compliant with Eliquis anticoagulation without having any bleeding problems.  He is no longer taking Jardiance.  He has not taken any furosemide in the last 3 to 4 months and has not had any complaints of lower extremity edema.  He is currently receiving metoprolol succinate and is quite bradycardic at 49 bpm (higher doses caused junctional rhythm) and Entresto at the maximum tolerated dose (higher doses caused symptomatic hypotension).  Not on spironolactone since he has moderate chronic kidney disease following his renal infarction, most recent creatinine 1.57.  His  most recent echocardiogram showed improved LVEF to 40-45%.  He continues to write poetry.  It seems to me that this is an excellent way for him to dispel his frustrations with life and our contemporary society. 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  L/R cath on 01/02/2020 showed normal coronary arteries. TEE during hospitalization on 01/03/2020 showed EF of 30-35% with moderated reduced left ventricular function with global hypokinesis. The right ventricle function was severely reduced with severe enlargement. Biatrial enlargement noted. AV and MV without stenosis or regurg. Agitated bubble study with findings of large PFO with bidirectional shunting.   Repeat outpatient TTE on 03/12/2020 showed improvement in EF to 40-45% with continued left ventricular dysfunction and global hypokinesis. Right ventricular systolic function has improved to normal. MV appears normal. Left venous doppler obtained on the same day shows chronic left popliteal and common femoral DVTs.   In July 2021, he was hospitalized and treated for hypoxia while traveling to [facility]  His records states that he required 4 L nasal cannula oxygen because he would desaturate to the  low 80% range on room air.  CT of the chest did not show evidence of new pulmonary embolism, but showed "groundglass" opacities in the lower lobes. He reportedly had 3 negative Covid test (see documentation of at least 1 of these).  He was treated for Covid nonetheless.  An extensive panel of respiratory pathogen test was also negative (including Legionella, RSV, flu etc).  During that same hospitalization his white blood cell count was elevated at 17K.  Procalcitonin was elevated of 0.2 and CRP was markedly elevated at 29.  His renal function was markedly improved from the values we have recorded in [place] with a creatinine of 1.2.  Following that hospitalization he had some troubles with epigastric  discomfort and small amounts of hematochezia.  He went to the emergency room in [place] after returning from [place] with abdominal pain.  He had a CT of the chest and abdomen/pelvis that showed normal findings.  Creatinine was elevated at 1.8.  He was discharged from the emergency room and has been feeling well since then.  Another follow-up echocardiogram 05/28/2020 still showed LVEF 40-45% and there was no evidence of residual shunting across the atrial septum, but either color Doppler or saline contrast study.  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    PFO (patent foramen ovale) 01/03/2020   Pulmonary embolism ([place]) 12/29/2019   Renal infarct [facility])     Past Surgical History:  Procedure Laterality Date   BUBBLE STUDY  01/03/2020   Procedure: BUBBLE STUDY;  Surgeon: [name], MD;  Location: [facility];  Service: Cardiovascular;;   IR ANGIOGRAM PULMONARY BILATERAL SELECTIVE  12/30/2019   IR ANGIOGRAM SELECTIVE EACH ADDITIONAL VESSEL  12/30/2019   IR ANGIOGRAM SELECTIVE EACH ADDITIONAL VESSEL  12/30/2019   IR THROMBECT PRIM MECH ADD ([place]) MOD SED  12/30/2019   IR THROMBECT PRIM MECH INIT (INCLU) MOD SED  12/30/2019    IR US GUIDE VASC ACCESS RIGHT  12/30/2019   RIGHT/LEFT HEART CATH AND CORONARY ANGIOGRAPHY N/A 01/02/2020   Procedure: RIGHT/LEFT HEART CATH AND CORONARY ANGIOGRAPHY;  Surgeon: [name], MD;  Location: [facility];  Service: Cardiovascular;  Laterality: N/A;   TEE WITHOUT CARDIOVERSION N/A 01/03/2020   Procedure: TRANSESOPHAGEAL ECHOCARDIOGRAM (TEE)   DEFINITY ;  Surgeon: [name], MD;  Location[facility] ENDOSCOPY;  Service: Cardiovascular;  Laterality: N/A;    Current Medications: Current Meds  Medication Sig   apixaban (ELIQUIS) 2.5 MG TABS tablet Take 1 tablet (2.5 mg total) by mouth 2 (two) times daily.   metoprolol succinate (TOPROL-XL) 50 MG 24 hr tablet Take 1 tablet (50 mg total) by mouth daily.   omeprazole (PRILOSEC) 20 MG capsule Take 20 mg by mouth 2 (two) times daily before a meal.  sacubitril-valsartan (ENTRESTO) 49-51 MG Take 1 tablet by mouth 2 (two) times daily.   [DISCONTINUED] diclofenac Sodium (VOLTAREN) 1 % GEL Apply 1 application topically 3 (three) times daily as needed (knee pain).   [DISCONTINUED] empagliflozin (JARDIANCE) 10 MG TABS tablet Take 1 tablet (10 mg total) by mouth daily before breakfast.     Allergies:   Patient has no known allergies.   Social History   Socioeconomic History   Marital status: Married    Spouse name: Not on file   Number of children: 0   Years of education: Not on file   Highest education level: Not on file  Occupational History   Occupation: self employed  Tobacco Use   Smoking status: Former    Types: Cigarettes    Quit date: 01/05/2006    Years since quitting: 16.4   Smokeless tobacco: Never  Vaping Use   Vaping Use: Never used  Substance and Sexual Activity   Alcohol use: Yes    Alcohol/week: 6.0 standard drinks of alcohol    Types: 2 Glasses of wine, 4 Cans of beer per week    Comment: 0-2 per day   Drug use: No   Sexual activity: Yes  Other Topics Concern   Not on file  Social History  Narrative   Marital status:  Married x 3 years; not happily married.      Children:  None      Lives: alone.      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      Tobacco: smoke cigars 2 per day.  Quit cigarettes in 2001; smoked x 15 years.      Alcohol:  Weekends 4 beers per week; 2 drinks per week.      Drugs:  None      Exercising:  Three days per week; active job.        Seatbelt:  100%      Guns:  None      Sexual activity: condoms; Gonorrhea in high school.  One partner in past year.  Last STD screening 2013.   Social Determinants of Health   Financial Resource Strain: Not on file  Food Insecurity: Not on file  Transportation Needs: Not on file  Physical Activity: Not on file  Stress: Not on file  Social Connections: Not on file    Family History: The patient's *family history includes Glaucoma in his father and mother; Heart disease in his father; Ovarian cancer in his cousin; Pancreatic cancer in his sister. There is no history of Colon cancer, Colitis, Esophageal cancer, Rectal cancer, or Stomach cancer.  ROS:   Please see the history of present illness.    All other systems reviewed and are negative.  EKGs/Labs/Other Studies Reviewed:    The following studies were reviewed today:  TEE (01/03/2020)  1. There is a smoke in the left ventricular apex with no evidence for a  thrombus on Definity echo contrast images.. Left ventricular ejection  fraction, by estimation, is 30 to 35%. The left ventricle has moderately decreased function. The left ventricle demonstrates global hypokinesis. There is the interventricular septum is flattened in systole and diastole, consistent with right ventricular pressure and volume overload.   2. Right ventricular systolic function is severely reduced. The right  ventricular size is severely enlarged. There is normal pulmonary artery systolic pressure.   3. Left atrial size was moderately dilated. No left atrial/left atrial  appendage thrombus was detected.   4. Right atrial  size was moderately dilated.   5. The mitral valve is normal in structure. Mild mitral valve regurgitation. No evidence of mitral stenosis.   6. The aortic valve is normal in structure. Aortic valve regurgitation is not visualized. No aortic stenosis is present.   7. The inferior vena cava is normal in size with greater than 50% respiratory variability, suggesting right atrial pressure of 3 mmHg.   8. Agitated saline contrast bubble study was significantly positive with shunting observed within the first cardiac cycles suggestive of  interatrial shunt. There is a large patent foramen ovale with bidirectional shunting across atrial septum.   Conclusion(s)/Recommendation(s): Findings are concerning for an  interatrial shunt as detailed above.   TTE (03/12/2020)  1. Left ventricular ejection fraction, by estimation, is 40 to 45%. The  left ventricle has mildly decreased function. The left ventricle demonstrates global hypokinesis. Left ventricular diastolic parameters are indeterminate. The average left ventricular global longitudinal strain is -14.0 %.   2. Right ventricular systolic function is normal. The right ventricular size is normal. There is normal pulmonary artery systolic pressure.   3. Left atrial size was mildly dilated.   4. The mitral valve is normal in structure. Trivial mitral valve regurgitation.   5. The aortic valve is tricuspid. Aortic valve regurgitation is not visualized. No aortic stenosis is present.   6. The inferior vena cava is normal in size with greater than 50% respiratory variability, suggesting right atrial pressure of 3 mmHg.   Conclusion(s)/Recommendation(s): EF reduced, though visually and by  measurement appears slightly improved compared to prior. RV appears  normal, normal RVSP. Mitral valve appears normal, previously noted as  myxomatous. Strain mildly and diffusely abnormal   throughout LV.   Echo  05/28/2020  1. Left ventricular ejection fraction, by estimation, is 40 to 45%. The  left ventricle has mildly decreased function. The left ventricle  demonstrates global hypokinesis. Left ventricular diastolic parameters are  consistent with Grade I diastolic  dysfunction (impaired relaxation).   2. Right ventricular systolic function is normal. The right ventricular  size is normal. Tricuspid regurgitation signal is inadequate for assessing  PA pressure.   3. The mitral valve is normal in structure. No evidence of mitral valve  regurgitation. No evidence of mitral stenosis.   4. The aortic valve is tricuspid. Aortic valve regurgitation is not  visualized. No aortic stenosis is present.   5. The inferior vena cava is normal in size with greater than 50%  respiratory variability, suggesting right atrial pressure of 3 mmHg.   6. Negative bubble study, no evidence for PFO or ASD.   EKG:  EKG is ordered today.  It shows sinus bradycardia 49 bpm, otherwise normal tracing, QTc 432 ms  Recent Labs: No results found for requested labs within last 365 days.   Recent Lipid Panel No results found for: "CHOL", "HDL", "LDLCALC", "LDLDIRECT", "TRIG", "CHOLHDL" Labs checked at [place].  Physical Exam:    VS:  BP 138/81   Pulse (!) 49   Ht 6' (1.829 m)   Wt 183 lb (83 kg)   SpO2 96%   BMI 24.82 kg/m     Wt Readings from Last 3 Encounters:  06/09/22 183 lb (83 kg)  07/17/21 187 lb 3.2 oz (84.9 kg)  04/15/21 187 lb 3.2 oz (84.9 kg)     General: Alert, oriented x3, no distress, appears very fit and younger than stated age. Head: no evidence of trauma, PERRL, EOMI, no exophtalmos or lid lag, no myxedema,  no xanthelasma; normal ears, nose and oropharynx Neck: normal jugular venous pulsations and no hepatojugular reflux; brisk carotid pulses without delay and no carotid bruits Chest: clear to auscultation, no signs of consolidation by percussion or palpation, normal fremitus, symmetrical and full  respiratory excursions Cardiovascular: normal position and quality of the apical impulse, regular rhythm, normal first and second heart sounds, no murmurs, rubs or gallops Abdomen: no tenderness or distention, no masses by palpation, no abnormal pulsatility or arterial bruits, normal bowel sounds, no hepatosplenomegaly Extremities: no clubbing, cyanosis or edema; 2+ radial, ulnar and brachial pulses bilaterally; 2+ right femoral, posterior tibial and dorsalis pedis pulses; 2+ left femoral, posterior tibial and dorsalis pedis pulses; no subclavian or femoral bruits Neurological: grossly nonfocal Psych: Normal mood and affect     ASSESSMENT:    1. Chronic combined systolic and diastolic heart failure ([place])   2. History of pulmonary embolism   3. 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 History of atrial flutter   5. Stage 3b chronic kidney disease ([place])   6. Controlled type 2 diabetes mellitus without complication, without long-term current use of insulin (HCC)      PLAN:    In order of problems listed above:  HFrEF: NYHA functional class I, euvolemic.  Nonischemic cardiomyopathy of uncertain mechanism, improved with treatment with Entresto and beta-blockers (dose is limited by hypotension and junctional rhythm respectively).  He has stopped taking his SGLT2 inhibitor and is also not taking furosemide, but has not had any recurrence of edema or dyspnea.  Not on aldosterone antagonist due to renal dysfunction.  Careful with the sodium in his diet and weighing himself daily. Submassive PE with chronic DVT: Asymptomatic.  Now on prophylactic dose anticoagulation.  Since his DVT/PE was unprovoked and was extremely dangerous, would recommend lifelong anticoagulation. 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  He likely popped open a PFO due to sudden marked increase in right heart pressures when he had his pulmonary embolism. Atrial Flutter: No recurrence of this clinically, since the initial presentation with pulmonary embolism and right heart strain.  This was likely triggered by right heart strain during pulmonary embolism.  [id number] (embolic event, LV dysfunction), but unless the arrhythmia recurs, we will keep him on prophylactic dose anticoagulation only. Junctional rhythm: Occurred when we used doses of metoprolol higher than 50 mg daily. 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 DM: Follow at the [place].  He is lean.   Patient Instructions  Medication Instructions:  No changes *If you need a refill on your cardiac medications before your next appointment, please call your pharmacy*   Lab Work: None ordered If you have labs (blood work) drawn today and your tests are completely normal, you will receive your results only by: [place] (if you have MyChart) OR A paper copy in the mail If you have any lab test that is abnormal or we need to change your treatment, we will call you to review the results.   Testing/Procedures: None ordered   Follow-Up: At [facility], you and your health needs are our priority.  As part of our continuing mission to provide you with exceptional heart care, we have created designated Provider Care Teams.  These Care Teams include your primary Cardiologist (  physician) and Advanced Practice Providers (APPs -  Physician Assistants and Nurse Practitioners) who all work together to provide you with the care you need, when you need it.  We recommend signing up for the patient portal called "MyChart".  Sign up information is provided on this After Visit Summary.  MyChart is used to connect with patients for Virtual Visits (Telemedicine).  Patients are able to view lab/test results, encounter notes,  upcoming appointments, etc.  Non-urgent messages can be sent to your provider as well.   To learn more about what you can do with MyChart, go to [email or web address].    Your next appointment:   6 month(s)  The format for your next appointment:   In Person  Provider:   Sanda Klein, MD      Important Information About [place] Ervan Heber, MD, [place] [phone] 06/14/2022, 3:40 PM

## 2022-06-09 NOTE — Patient Instructions (Signed)
Medication Instructions:  No changes *If you need a refill on your cardiac medications before your next appointment, please call your pharmacy*   Lab Work: None ordered If you have labs (blood work) drawn today and your tests are completely normal, you will receive your results only by: MyChart Message (if you have MyChart) OR A paper copy in the mail If you have any lab test that is abnormal or we need to change your treatment, we will call you to review the results.   Testing/Procedures: None ordered   Follow-Up: At [facility], you and your health needs are our priority.  As part of our continuing mission to provide you with exceptional heart care, we have created designated Provider Care Teams.  These Care Teams include your primary Cardiologist (physician) and Advanced Practice Providers (APPs -  Physician Assistants and Nurse Practitioners) who all work together to provide you with the care you need, when you need it.  We recommend signing up for the patient portal called "MyChart".  Sign up information is provided on this After Visit Summary.  MyChart is used to connect with patients for Virtual Visits (Telemedicine).  Patients are able to view lab/test results, encounter notes, upcoming appointments, etc.  Non-urgent messages can be sent to your provider as well.   To learn more about what you can do with MyChart, go to [email or web address].    Your next appointment:   6 month(s)  The format for your next appointment:   In Person  Provider:   Mihai Croitoru, MD      Important Information About [place]       

## 2022-06-14 ENCOUNTER — Encounter: Payer: Self-pay | Admitting: Cardiovascular Disease

## 2024-01-16 ENCOUNTER — Emergency Department (HOSPITAL_COMMUNITY)

## 2024-01-16 ENCOUNTER — Emergency Department (HOSPITAL_COMMUNITY)
Admission: EM | Admit: 2024-01-16 | Discharge: 2024-01-17 | Disposition: A | Discharge status: Home / Self Care | Attending: Emergency Medicine | Admitting: Emergency Medicine

## 2024-01-16 ENCOUNTER — Encounter (HOSPITAL_COMMUNITY): Payer: Self-pay

## 2024-01-16 ENCOUNTER — Other Ambulatory Visit: Payer: Self-pay

## 2024-01-16 DIAGNOSIS — R42 Dizziness and giddiness: Secondary | ICD-10-CM | POA: Diagnosis present

## 2024-01-16 DIAGNOSIS — N1831 Chronic kidney disease, stage 3a: Secondary | ICD-10-CM | POA: Insufficient documentation

## 2024-01-16 DIAGNOSIS — Z7901 Long term (current) use of anticoagulants: Secondary | ICD-10-CM | POA: Diagnosis not present

## 2024-01-16 DIAGNOSIS — I5020 Unspecified systolic (congestive) heart failure: Secondary | ICD-10-CM | POA: Insufficient documentation

## 2024-01-16 DIAGNOSIS — H539 Unspecified visual disturbance: Secondary | ICD-10-CM | POA: Diagnosis not present

## 2024-01-16 DIAGNOSIS — J449 Chronic obstructive pulmonary disease, unspecified: Secondary | ICD-10-CM | POA: Insufficient documentation

## 2024-01-16 LAB — URINALYSIS, ROUTINE W REFLEX MICROSCOPIC
Bilirubin Urine: NEGATIVE
Glucose, UA: NEGATIVE mg/dL
Ketones, ur: NEGATIVE mg/dL
Leukocytes,Ua: NEGATIVE
Nitrite: NEGATIVE
Protein, ur: NEGATIVE mg/dL
Specific Gravity, Urine: 1.013 (ref 1.005–1.030)
pH: 5 (ref 5.0–8.0)

## 2024-01-16 LAB — CBC
HCT: 44.8 % (ref 39.0–52.0)
Hemoglobin: 16 g/dL (ref 13.0–17.0)
MCH: 30.2 pg (ref 26.0–34.0)
MCHC: 35.7 g/dL (ref 30.0–36.0)
MCV: 84.5 fL (ref 80.0–100.0)
Platelets: 149 10*3/uL — ABNORMAL LOW (ref 150–400)
RBC: 5.3 MIL/uL (ref 4.22–5.81)
RDW: 16.6 % — ABNORMAL HIGH (ref 11.5–15.5)
WBC: 8.1 10*3/uL (ref 4.0–10.5)
nRBC: 0 % (ref 0.0–0.2)

## 2024-01-16 LAB — COMPREHENSIVE METABOLIC PANEL WITH GFR
ALT: 19 U/L (ref 0–44)
AST: 29 U/L (ref 15–41)
Albumin: 2.9 g/dL — ABNORMAL LOW (ref 3.5–5.0)
Alkaline Phosphatase: 69 U/L (ref 38–126)
Anion gap: 9 (ref 5–15)
BUN: 23 mg/dL (ref 8–23)
CO2: 22 mmol/L (ref 22–32)
Calcium: 8.7 mg/dL — ABNORMAL LOW (ref 8.9–10.3)
Chloride: 110 mmol/L (ref 98–111)
Creatinine, Ser: 1.53 mg/dL — ABNORMAL HIGH (ref 0.61–1.24)
GFR, Estimated: 51 mL/min — ABNORMAL LOW (ref >60)
Glucose, Bld: 150 mg/dL — ABNORMAL HIGH (ref 70–99)
Potassium: 4.4 mmol/L (ref 3.5–5.1)
Sodium: 141 mmol/L (ref 135–145)
Total Bilirubin: 0.9 mg/dL (ref 0.0–1.2)
Total Protein: 6.6 g/dL (ref 6.5–8.1)

## 2024-01-16 LAB — MAGNESIUM: Magnesium: 1.9 mg/dL (ref 1.7–2.4)

## 2024-01-16 LAB — TROPONIN I (HIGH SENSITIVITY): Troponin I (High Sensitivity): 30 ng/L — ABNORMAL HIGH (ref <18)

## 2024-01-16 NOTE — ED Notes (Signed)
 Patient transported to MRI

## 2024-01-16 NOTE — ED Notes (Signed)
 Patient returned from MRI, states no needs at this time.

## 2024-01-16 NOTE — ED Provider Triage Note (Signed)
 Emergency Medicine Provider Triage Evaluation Note  Cody Joyce , a 63 y.o. male  was evaluated in triage.  Pt complains of dizziness with " bright vision", near syncope.  No extremity weakness or difficulty speaking.  Not associate with chest pain or shortness of breath.  Review of Systems  Positive:  Negative:   Physical Exam  BP 124/67 (BP Location: Right Arm)   Pulse 80   Temp 97.8 F (36.6 C)   Resp 17   Ht 6' (1.829 m)   Wt 74.8 kg   SpO2 94%   BMI 22.38 kg/m  Gen:   Awake, no distress   Resp:  Normal effort  MSK:   Moves extremities without difficulty  Other:  No gross neurodeficits  Medical Decision Making  Medically screening exam initiated at 2:12 PM.  Appropriate orders placed.  Cody Joyce was informed that the remainder of the evaluation will be completed by another provider, this initial triage assessment does not replace that evaluation, and the importance of remaining in the ED until their evaluation is complete.     Felicie Horning, PA-C 01/16/24 1421

## 2024-01-16 NOTE — ED Provider Notes (Signed)
 [name] [facility] Provider Note   CSN: [id number] Arrival date & time: 01/16/24  1235     History {Add pertinent medical, surgical, social history, OB history to HPI:1} Chief Complaint  Patient presents with   Dizziness    Cody Joyce is a 63 y.o. male with past medical history of COPD, PE,, DVT (on Eliquis ), systolic HF, NSTEMI, CKD stage IIIa presents emergency department for evaluation of visual disturbance and dizziness.  He reports that he was getting walking to put his food in the microwave today at 1030 when it "got real bright" and he started feeling presyncopal, dizzy, lightheaded, with a "slight" headache.  He was able to get himself to a chair without falling.  Episode lasted about 5-10 minutes and got better after sitting but dizziness did not completely resolve.  He reports that this is the "3rd or 4th time this has happened over the past couple months".  He denies associated nausea, vomiting, diarrhea, chest pain, shortness of breath, diaphoresis, slurred speech.  [facility]   Dizziness Associated symptoms: no chest pain, no diarrhea, no headaches, no nausea, no palpitations, no shortness of breath, no vomiting and no weakness       Home Medications Prior to Admission medications   Medication Sig Start Date End Date Taking? Authorizing Provider  apixaban  (ELIQUIS ) 2.5 MG TABS tablet Take 1 tablet (2.5 mg total) by mouth 2 (two) times daily. 03/17/22   Croitoru, Mihai, MD  metoprolol  succinate (TOPROL -XL) 50 MG 24 hr tablet Take 1 tablet (50 mg total) by mouth daily. 03/17/22   Croitoru, Mihai, MD  omeprazole (PRILOSEC) 20 MG capsule Take 20 mg by mouth 2 (two) times daily before a meal.    [provider]  sacubitril -valsartan  (ENTRESTO ) 49-51 MG Take 1 tablet by mouth 2 (two) times daily. 03/17/22   Croitoru, Karyl Paget, MD      Allergies    Patient has no known allergies.     Review of Systems   Review of Systems  Constitutional:  Negative for chills, fatigue and fever.  Respiratory:  Negative for cough, chest tightness, shortness of breath and wheezing.   Cardiovascular:  Negative for chest pain and palpitations.  Gastrointestinal:  Negative for abdominal pain, constipation, diarrhea, nausea and vomiting.  Neurological:  Positive for dizziness. Negative for seizures, weakness, light-headedness, numbness and headaches.    Physical Exam Updated Vital Signs BP 124/67 (BP Location: Right Arm)   Pulse 80   Temp 97.8 F (36.6 C)   Resp 17   Ht 6' (1.829 m)   Wt 74.8 kg   SpO2 94%   BMI 22.38 kg/m  Physical Exam Vitals and nursing note reviewed.  Constitutional:      General: He is not in acute distress.    Appearance: Normal appearance.  HENT:     Head: Normocephalic and atraumatic.  Eyes:     General: Lids are normal. Vision grossly intact. No visual field deficit.    Extraocular Movements:     Right eye: Normal extraocular motion and no nystagmus.     Left eye: Normal extraocular motion and no nystagmus.     Conjunctiva/sclera: Conjunctivae normal.  Cardiovascular:     Rate and Rhythm: Normal rate.  Pulmonary:     Effort: Pulmonary effort is normal. No respiratory distress.  Musculoskeletal:     Right lower leg: No edema.     Left lower leg: No edema.  Skin:  Capillary Refill: Capillary refill takes less than 2 seconds.     Coloration: Skin is not jaundiced or pale.  Neurological:     General: No focal deficit present.     Mental Status: He is alert and oriented to person, place, and time. Mental status is at baseline.     GCS: GCS eye subscore is 4. GCS verbal subscore is 5. GCS motor subscore is 6.     Cranial Nerves: No cranial nerve deficit, dysarthria or facial asymmetry.     Sensory: No sensory deficit.     Motor: No weakness, tremor, abnormal muscle tone, seizure activity or pronator drift.     Coordination: Coordination  normal. Finger-Nose-Finger Test and Heel to [facility] Heart Test normal. Rapid alternating movements normal.     Gait: Gait normal.     Deep Tendon Reflexes: Reflexes normal.     ED Results / Procedures / Treatments   Labs (all labs ordered are listed, but only abnormal results are displayed) Labs Reviewed  COMPREHENSIVE METABOLIC PANEL WITH GFR - Abnormal; Notable for the following components:      Result Value   Glucose, Bld 150 (*)    Creatinine, Ser 1.53 (*)    Calcium 8.7 (*)    Albumin 2.9 (*)    GFR, Estimated 51 (*)    All other components within normal limits  CBC - Abnormal; Notable for the following components:   RDW 16.6 (*)    Platelets 149 (*)    All other components within normal limits  URINALYSIS, ROUTINE W REFLEX MICROSCOPIC  MAGNESIUM   TROPONIN I (HIGH SENSITIVITY)  TROPONIN I (HIGH SENSITIVITY)    EKG None  Radiology CT Head Wo Contrast Result Date: 01/16/2024 CLINICAL DATA:  Syncope/presyncope, cerebrovascular cause suspected EXAM: CT HEAD WITHOUT CONTRAST TECHNIQUE: Contiguous axial images were obtained from the base of the skull through the vertex without intravenous contrast. RADIATION DOSE REDUCTION: This exam was performed according to the departmental dose-optimization program which includes automated exposure control, adjustment of the mA and/or kV according to patient size and/or use of iterative reconstruction technique. COMPARISON:  None Available. FINDINGS: Brain: No intracranial hemorrhage, mass effect, or midline shift. No hydrocephalus. The basilar cisterns are patent. No evidence of territorial infarct or acute ischemia. No extra-axial or intracranial fluid collection. Vascular: Atherosclerosis of skullbase vasculature without hyperdense vessel or abnormal calcification. Skull: No fracture or focal lesion. Sinuses/Orbits: Minor mucosal thickening of the paranasal sinuses. No fluid levels. No mastoid effusion. Other: Unremarkable scalp soft tissues.  IMPRESSION: No acute intracranial abnormality. Electronically Signed   By: Chadwick Colonel M.D.   On: 01/16/2024 15:57    Procedures Procedures  {Document cardiac monitor, telemetry assessment procedure when appropriate:1}  Medications Ordered in ED Medications - No data to display  ED Course/ Medical Decision Making/ A&P Clinical Course as of 01/16/24 1853  Mon Jan 16, 2024  1603 Creatinine(!): 1.53 Baseline 1.5-1.88 over past 3 years [LB]  1817 Troponin I (High Sensitivity)(!): 30 Baseline 18-21 three years ago [LB]    Clinical Course User Index [LB] Royann Cords, PA   {   Click here for ABCD2, HEART and other calculatorsREFRESH Note before signing :1}                              Medical Decision Making Amount and/or Complexity of Data Reviewed Labs: ordered. Decision-making details documented in ED Course. Radiology: ordered.   Patient presents to the ED  for concern of visual disturbance and dizziness, this involves an extensive number of treatment options, and is a complaint that carries with it a high risk of complications and morbidity.  The differential diagnosis includes CVA/TIA, electrolyte abnormality, UTI, infection, ACS   Co morbidities that complicate the patient evaluation  HPI   Additional history obtained:  Additional history obtained from Nursing   External records from outside source obtained and reviewed including triage RN note   Lab Tests:  I Ordered, and personally interpreted labs.  The pertinent results include:   Troponin 30 CBG 158 Creatinine 1.53 Calcium 8.7 UA without infection Platelets 149   Imaging Studies ordered:  I ordered imaging studies including CT head, MRI brain I independently visualized and interpreted imaging which showed *** I agree with the radiologist interpretation   Cardiac Monitoring:  The patient was maintained on a cardiac monitor.  I personally viewed and interpreted the cardiac monitored which  showed an underlying rhythm of: Sinus bradycardia 56    Consultations Obtained:  I requested consultation with the ***,  and discussed lab and imaging findings as well as pertinent plan - they recommend: ***   Problem List / ED Course:  Dizziness Visual disturbance Labs unremarkable and do not explain dizziness CT head negative for ICH MRI brain*** No motor or sensory deficits While in the emergency department, patient reports improvement and complete resolution of dizziness   Reevaluation:  After the interventions noted above, I reevaluated the patient and found that they have :improved     Dispostion:  After consideration of the diagnostic results and the patients response to treatment, I feel that the patent would benefit from ***.    {Document critical care time when appropriate:1} {Document review of labs and clinical decision tools ie heart score, Chads2Vasc2 etc:1}  {Document your independent review of radiology images, and any outside records:1} {Document your discussion with family members, caretakers, and with consultants:1} {Document social determinants of health affecting pt's care:1} {Document your decision making why or why not admission, treatments were needed:1} Final Clinical Impression(s) / ED Diagnoses Final diagnoses:  None    Rx / DC Orders ED Discharge Orders     None

## 2024-01-16 NOTE — ED Notes (Signed)
 Patient up to restroom, states no other needs at this time.

## 2024-01-16 NOTE — ED Notes (Signed)
 Main lab to add Trop to [facility]

## 2024-01-16 NOTE — ED Triage Notes (Addendum)
 Pt came in via POV d/t 1.5 hr ago had a short moment where he felt dizzy while cooking & when it happened "things looked bright" so he stood still then he sat down for about 30-45 minutes then it all subsided. Pt denies any weakness or slurred speech during that moment. A/Ox4, no pain or dizziness while in triage.

## 2024-01-17 LAB — TROPONIN I (HIGH SENSITIVITY): Troponin I (High Sensitivity): 34 ng/L — ABNORMAL HIGH (ref <18)

## 2024-01-17 NOTE — ED Notes (Signed)
 Patient discharged in stable condition, education materials explained including, follow up, any prescriptions and reasons to return. Patient voiced agreement to education and discharge material.

## 2024-01-17 NOTE — Discharge Instructions (Addendum)
 Thank you for let us  evaluate you today.  Your CT and MRI of your head was negative for intracranial abnormalities.  Please follow-up with neurology for further management.  Return to Emergency Department if you experience significant worsening of dizziness especially if it does not change with position, altered mentation, seizures, weakness or numbness on one side your body

## 2024-02-13 ENCOUNTER — Encounter: Payer: Self-pay | Admitting: Neurology

## 2024-05-02 ENCOUNTER — Encounter: Payer: Self-pay | Admitting: Neurology

## 2024-05-02 ENCOUNTER — Ambulatory Visit (INDEPENDENT_AMBULATORY_CARE_PROVIDER_SITE_OTHER): Admitting: Neurology

## 2024-05-02 VITALS — BP 149/83 | HR 97 | Resp 20 | Ht 73.0 in | Wt 156.0 lb

## 2024-05-02 DIAGNOSIS — R42 Dizziness and giddiness: Secondary | ICD-10-CM

## 2024-05-02 NOTE — Patient Instructions (Signed)
 Good to meet you!  Please follow-up with your cardiologist to discuss dizziness and medications  2. Continue staying hydrated  3. Please start using a safety harness when working from heights  4. Follow-up as needed, call for any changes

## 2024-05-02 NOTE — Progress Notes (Signed)
 NEUROLOGY CONSULTATION NOTE  ASHAZ ROBLING MRN: [id number] DOB: [birth year]  Referring provider: Dr. Odessa Blanch Primary care provider: [facility]  Reason for consult:  dizziness  Dear Dr Blanch:  Thank you for your kind referral of Gregorio E Ehinger for consultation of the above symptoms. Although his history is well known to you, please allow me to reiterate it for the purpose of our medical record. He is alone in the office today.Records and images were personally reviewed where available.   HISTORY OF PRESENT ILLNESS: This is a pleasant 63 year old right-handed man with a history of COPD, PE, DVT on Eliquis , CHF, NSTEMI, CKD, presenting for evaluation of dizziness. He reports symptoms started a year ago, he would feel lightheaded like he would faint. A lot of times they occur when standing, he was on a ladder one time and felt lightheaded. One time he felt the same symptoms and grabbed the counter when he felt himself falling. He went to the ER on 01/16/24 for these symptoms, he was walking to put food in the microwave when vision got bright and he felt lightheaded with a slight headache. He was able to sit down and symptoms improved in 5-10 minutes but did not fully resolve so he went to the ER. BP was 132/83. Bloodwork unremarkable, creatinine close to baseline at 1.53. I personally reviewed MRI brain without contrast which did not show any acute changes, there was minimal chronic microvascular disease.   Initially he was having dizziness twice a week, this has decreased in frequency, last episode was a few weeks ago. He denies any staring/unresponsive episodes, olfactory/gustatory hallucinations, focal numbness/tingling/weakness, myoclonic jerks. Vision would be blurred, no nausea/vomiting. They do not occur when he is supine. He has noticed it when he gets up too quickly. No headaches, dysarthria/dysphagia, neck pain, bowel/bladder dysfunction. He has some back pain. For  1-2 months, he was having difficulty with sleep initiation and maintenance, but states he is getting more sleep now. He is tired all the time. He has not seen his cardiologist about these symptoms.     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    PFO (patent foramen ovale) 01/03/2020   Pulmonary embolism (HCC) 12/29/2019   Renal infarct [facility])     PAST SURGICAL HISTORY: Past Surgical History:  Procedure Laterality Date   BUBBLE STUDY  01/03/2020   Procedure: BUBBLE STUDY;  Surgeon: Maranda Leim DEL, MD;  Location: [facility] ENDOSCOPY;  Service: Cardiovascular;;   IR ANGIOGRAM PULMONARY BILATERAL SELECTIVE  12/30/2019   IR ANGIOGRAM SELECTIVE EACH ADDITIONAL VESSEL  12/30/2019   IR ANGIOGRAM SELECTIVE EACH ADDITIONAL VESSEL  12/30/2019   IR THROMBECT PRIM MECH ADD (INCLU) MOD SED  12/30/2019   IR THROMBECT PRIM  MECH INIT (INCLU) MOD SED  12/30/2019   IR US  GUIDE VASC ACCESS RIGHT  12/30/2019   RIGHT/LEFT HEART CATH AND CORONARY ANGIOGRAPHY N/A 01/02/2020   Procedure: RIGHT/LEFT HEART CATH AND CORONARY ANGIOGRAPHY;  Surgeon: Claudene Victory ORN, MD;  Location: [facility] INVASIVE CV LAB;  Service: Cardiovascular;  Laterality: N/A;    TEE WITHOUT CARDIOVERSION N/A 01/03/2020   Procedure: TRANSESOPHAGEAL ECHOCARDIOGRAM (TEE)   DEFINITY  ;  Surgeon: Maranda Leim DEL, MD;  Location: [facility] ENDOSCOPY;  Service: Cardiovascular;  Laterality: N/A;    MEDICATIONS: Current Outpatient Medications on File Prior to Visit  Medication Sig Dispense Refill   apixaban  (ELIQUIS ) 2.5 MG TABS tablet Take 1 tablet (2.5 mg total) by mouth 2 (two) times daily. 180 tablet 1   metoprolol  succinate (TOPROL -XL) 50 MG 24 hr tablet Take 1 tablet (50 mg total) by mouth daily. 30 tablet 3   omeprazole (PRILOSEC) 20 MG capsule Take 20 mg by mouth 2 (two) times daily before a meal.     sacubitril -valsartan  (ENTRESTO ) 49-51 MG Take 1 tablet by mouth 2 (two) times daily. 60 tablet 6   Current Facility-Administered Medications on File Prior to Visit  Medication Dose Route Frequency Provider Last Rate Last Admin   sodium chloride  flush (NS) 0.9 % injection 10 mL  10 mL Intravenous PRN Croitoru, Mihai, MD   20 mL at 05/28/20 1540    ALLERGIES: No Known Allergies  FAMILY HISTORY: Family History  Problem Relation Age of Onset   Glaucoma Mother    Heart disease Father        stents/CAD/AMI age 24s.   Glaucoma Father    Pancreatic cancer Sister    Ovarian cancer Cousin        maternal   Colon cancer Neg Hx    Colitis Neg Hx    Esophageal cancer Neg Hx    Rectal cancer Neg Hx    Stomach cancer Neg Hx     SOCIAL HISTORY: Social History   Socioeconomic History   Marital status: Married    Spouse name: Not on file   Number of children: 0   Years of education: Not on file   Highest education level: Not on file  Occupational History   Occupation: self employed  Tobacco Use   Smoking status: Former    Current packs/day: 0.00    Types: Cigarettes    Quit date: 01/05/2006    Years since quitting: 18.3   Smokeless tobacco: Never  Vaping Use   Vaping status: Never Used  Substance and Sexual Activity   Alcohol use: Yes    Alcohol/week: 6.0  standard drinks of alcohol    Types: 2 Glasses of wine, 4 Cans of beer per week    Comment: 0-2 per day   Drug use: No   Sexual activity: Yes  Other Topics Concern   Not on file  Social History Narrative   Marital status:  Married x 3 years; not happily married.      Children:  None      Lives: alone.      Employment:  Medical sales representative Custodian work x 7 years; happy.      Tobacco: smoke cigars 2 per day.  Quit cigarettes in 2001; smoked x 15 years.      Alcohol:  Weekends 4 beers per week; 2 drinks per week.      Drugs:  None      Exercising:  Three days per week; active job.  Seatbelt:  100%      Guns:  None      Sexual activity: condoms; Gonorrhea in high school.  One partner in past year.  Last STD screening 2013.   Social Drivers of Corporate investment banker Strain: Not on file  Food Insecurity: Not on file  Transportation Needs: Not on file  Physical Activity: Not on file  Stress: Not on file  Social Connections: Not on file  Intimate Partner Violence: Not on file     PHYSICAL EXAM: Vitals:   05/02/24 1047 05/02/24 1048  Resp: 20 20  SpO2: 98% 96%   Orthostatic vital signs: Supine BP 120/70 HR 84 Sitting BP 120/88 HR 98 Standing BP 120/80 HR 98  General: No acute distress Head:  Normocephalic/atraumatic Skin/Extremities: No rash, no edema Neurological Exam: Mental status: alert and awake, no dysarthria or aphasia, Fund of knowledge is appropriate.  Attention and concentration are normal.     Cranial nerves: CN I: not tested CN II: pupils equal, round, visual fields intact CN III, IV, VI:  full range of motion, no nystagmus, no ptosis CN V: facial sensation intact CN VII: upper and lower face symmetric CN VIII: hearing intact to conversation CN XI: sternocleidomastoid and trapezius muscles intact CN XII: tongue midline Bulk & Tone: normal, no fasciculations. Motor: 5/5 throughout with no pronator drift. Sensation: intact to light touch, cold, pin,  vibration sense.  No extinction to double simultaneous stimulation.  Romberg test negative Deep Tendon Reflexes: +2 throughout Cerebellar: no incoordination on finger to nose, heel to shin testing, RAMs Gait: narrow-based and steady, able to tandem walk adequately. Tremor: none   IMPRESSION: This is a pleasant 63 year old right-handed man with a history of COPD, PE, DVT on Eliquis , CHF, NSTEMI, CKD, presenting for evaluation of dizziness described as lightheadedness with vision going bright, likely vasovagal. No orthostasis today. His neurological exam is normal, brain MRI normal. Findings were discussed with the patient, he was advised to contact his cardiologist and discuss medications. Continue staying hydrated. He was advised to start using a safety harness when working from heights. Follow-up as needed, call for any changes.    Thank you for allowing me to participate in the care of this patient. Please do not hesitate to call for any questions or concerns.   Darice Shivers, M.D.  CC: Dr. Tobie, Dr. Francyne, [facility]

## 2024-05-10 ENCOUNTER — Telehealth: Payer: Self-pay | Admitting: Emergency Medicine

## 2024-05-10 NOTE — Telephone Encounter (Signed)
 Croitoru, Jerel, MD  Georjean Darice HERO, MD; Davee Comer CROME, RN We have not seen him in almost 2 years and it's time to get him back in the office, first available appointment please.  Called and left message with call back number asking him to call office and schedule an appointment with Dr Francyne

## 2024-05-29 ENCOUNTER — Encounter: Payer: Self-pay | Admitting: Gastroenterology

## 2024-07-19 ENCOUNTER — Other Ambulatory Visit: Payer: Self-pay

## 2024-07-19 ENCOUNTER — Emergency Department (HOSPITAL_COMMUNITY)

## 2024-07-19 ENCOUNTER — Encounter (HOSPITAL_COMMUNITY): Payer: Self-pay

## 2024-07-19 ENCOUNTER — Inpatient Hospital Stay (HOSPITAL_COMMUNITY)
Admission: EM | Admit: 2024-07-19 | Discharge: 2024-07-27 | DRG: 286 | Disposition: A | Discharge status: Home / Self Care | Attending: Family Medicine | Admitting: Family Medicine

## 2024-07-19 DIAGNOSIS — Z91141 Patient's other noncompliance with medication regimen due to financial hardship: Secondary | ICD-10-CM

## 2024-07-19 DIAGNOSIS — Z7984 Long term (current) use of oral hypoglycemic drugs: Secondary | ICD-10-CM

## 2024-07-19 DIAGNOSIS — I272 Pulmonary hypertension, unspecified: Secondary | ICD-10-CM | POA: Diagnosis present

## 2024-07-19 DIAGNOSIS — Z83511 Family history of glaucoma: Secondary | ICD-10-CM

## 2024-07-19 DIAGNOSIS — Z86711 Personal history of pulmonary embolism: Secondary | ICD-10-CM

## 2024-07-19 DIAGNOSIS — I428 Other cardiomyopathies: Secondary | ICD-10-CM | POA: Diagnosis present

## 2024-07-19 DIAGNOSIS — Z7901 Long term (current) use of anticoagulants: Secondary | ICD-10-CM

## 2024-07-19 DIAGNOSIS — J439 Emphysema, unspecified: Secondary | ICD-10-CM | POA: Diagnosis present

## 2024-07-19 DIAGNOSIS — I130 Hypertensive heart and chronic kidney disease with heart failure and stage 1 through stage 4 chronic kidney disease, or unspecified chronic kidney disease: Secondary | ICD-10-CM | POA: Diagnosis present

## 2024-07-19 DIAGNOSIS — I472 Ventricular tachycardia, unspecified: Secondary | ICD-10-CM | POA: Diagnosis present

## 2024-07-19 DIAGNOSIS — I5023 Acute on chronic systolic (congestive) heart failure: Secondary | ICD-10-CM | POA: Diagnosis present

## 2024-07-19 DIAGNOSIS — Z5982 Transportation insecurity: Secondary | ICD-10-CM

## 2024-07-19 DIAGNOSIS — K769 Liver disease, unspecified: Secondary | ICD-10-CM | POA: Insufficient documentation

## 2024-07-19 DIAGNOSIS — R0602 Shortness of breath: Secondary | ICD-10-CM

## 2024-07-19 DIAGNOSIS — Z79899 Other long term (current) drug therapy: Secondary | ICD-10-CM | POA: Diagnosis not present

## 2024-07-19 DIAGNOSIS — N1831 Chronic kidney disease, stage 3a: Secondary | ICD-10-CM | POA: Diagnosis present

## 2024-07-19 DIAGNOSIS — I252 Old myocardial infarction: Secondary | ICD-10-CM | POA: Diagnosis not present

## 2024-07-19 DIAGNOSIS — K761 Chronic passive congestion of liver: Secondary | ICD-10-CM | POA: Diagnosis present

## 2024-07-19 DIAGNOSIS — F17290 Nicotine dependence, other tobacco product, uncomplicated: Secondary | ICD-10-CM | POA: Diagnosis present

## 2024-07-19 DIAGNOSIS — Z8249 Family history of ischemic heart disease and other diseases of the circulatory system: Secondary | ICD-10-CM

## 2024-07-19 DIAGNOSIS — Z5941 Food insecurity: Secondary | ICD-10-CM

## 2024-07-19 DIAGNOSIS — Z789 Other specified health status: Secondary | ICD-10-CM

## 2024-07-19 DIAGNOSIS — I509 Heart failure, unspecified: Principal | ICD-10-CM

## 2024-07-19 DIAGNOSIS — Z87448 Personal history of other diseases of urinary system: Secondary | ICD-10-CM

## 2024-07-19 DIAGNOSIS — T45516A Underdosing of anticoagulants, initial encounter: Secondary | ICD-10-CM | POA: Diagnosis present

## 2024-07-19 DIAGNOSIS — K831 Obstruction of bile duct: Secondary | ICD-10-CM | POA: Diagnosis present

## 2024-07-19 DIAGNOSIS — Z86718 Personal history of other venous thrombosis and embolism: Secondary | ICD-10-CM | POA: Diagnosis not present

## 2024-07-19 DIAGNOSIS — K219 Gastro-esophageal reflux disease without esophagitis: Secondary | ICD-10-CM | POA: Diagnosis present

## 2024-07-19 DIAGNOSIS — Z800 Family history of malignant neoplasm of digestive organs: Secondary | ICD-10-CM

## 2024-07-19 DIAGNOSIS — Z59869 Financial insecurity, unspecified: Secondary | ICD-10-CM

## 2024-07-19 DIAGNOSIS — I4892 Unspecified atrial flutter: Secondary | ICD-10-CM | POA: Diagnosis present

## 2024-07-19 DIAGNOSIS — F1290 Cannabis use, unspecified, uncomplicated: Secondary | ICD-10-CM | POA: Diagnosis present

## 2024-07-19 DIAGNOSIS — F32A Depression, unspecified: Secondary | ICD-10-CM | POA: Diagnosis present

## 2024-07-19 DIAGNOSIS — Q2112 Patent foramen ovale: Secondary | ICD-10-CM | POA: Diagnosis not present

## 2024-07-19 DIAGNOSIS — R0902 Hypoxemia: Secondary | ICD-10-CM | POA: Diagnosis present

## 2024-07-19 DIAGNOSIS — F1020 Alcohol dependence, uncomplicated: Secondary | ICD-10-CM | POA: Diagnosis present

## 2024-07-19 DIAGNOSIS — I5082 Biventricular heart failure: Secondary | ICD-10-CM | POA: Diagnosis present

## 2024-07-19 DIAGNOSIS — F410 Panic disorder [episodic paroxysmal anxiety] without agoraphobia: Secondary | ICD-10-CM | POA: Diagnosis present

## 2024-07-19 DIAGNOSIS — F419 Anxiety disorder, unspecified: Secondary | ICD-10-CM | POA: Diagnosis present

## 2024-07-19 DIAGNOSIS — Z8659 Personal history of other mental and behavioral disorders: Secondary | ICD-10-CM

## 2024-07-19 LAB — COMPREHENSIVE METABOLIC PANEL WITH GFR
ALT: 141 U/L — ABNORMAL HIGH (ref 0–44)
AST: 133 U/L — ABNORMAL HIGH (ref 15–41)
Albumin: 2.4 g/dL — ABNORMAL LOW (ref 3.5–5.0)
Alkaline Phosphatase: 110 U/L (ref 38–126)
Anion gap: 9 (ref 5–15)
BUN: 18 mg/dL (ref 8–23)
CO2: 24 mmol/L (ref 22–32)
Calcium: 8.4 mg/dL — ABNORMAL LOW (ref 8.9–10.3)
Chloride: 110 mmol/L (ref 98–111)
Creatinine, Ser: 1.38 mg/dL — ABNORMAL HIGH (ref 0.61–1.24)
GFR, Estimated: 57 mL/min — ABNORMAL LOW (ref >60)
Glucose, Bld: 118 mg/dL — ABNORMAL HIGH (ref 70–99)
Potassium: 4.5 mmol/L (ref 3.5–5.1)
Sodium: 143 mmol/L (ref 135–145)
Total Bilirubin: 0.9 mg/dL (ref 0.0–1.2)
Total Protein: 6.7 g/dL (ref 6.5–8.1)

## 2024-07-19 LAB — CBC WITH DIFFERENTIAL/PLATELET
Abs Immature Granulocytes: 0.02 K/uL (ref 0.00–0.07)
Basophils Absolute: 0 K/uL (ref 0.0–0.1)
Basophils Relative: 0 %
Eosinophils Absolute: 0 K/uL (ref 0.0–0.5)
Eosinophils Relative: 1 %
HCT: 42.3 % (ref 39.0–52.0)
Hemoglobin: 15 g/dL (ref 13.0–17.0)
Immature Granulocytes: 0 %
Lymphocytes Relative: 26 %
Lymphs Abs: 2.1 K/uL (ref 0.7–4.0)
MCH: 30.4 pg (ref 26.0–34.0)
MCHC: 35.5 g/dL (ref 30.0–36.0)
MCV: 85.6 fL (ref 80.0–100.0)
Monocytes Absolute: 1 K/uL (ref 0.1–1.0)
Monocytes Relative: 12 %
Neutro Abs: 4.9 K/uL (ref 1.7–7.7)
Neutrophils Relative %: 61 %
Platelets: 173 K/uL (ref 150–400)
RBC: 4.94 MIL/uL (ref 4.22–5.81)
RDW: 17.1 % — ABNORMAL HIGH (ref 11.5–15.5)
WBC: 8.1 K/uL (ref 4.0–10.5)
nRBC: 0 % (ref 0.0–0.2)

## 2024-07-19 LAB — RESP PANEL BY RT-PCR (RSV, FLU A&B, COVID)  RVPGX2
Influenza A by PCR: NEGATIVE
Influenza B by PCR: NEGATIVE
Resp Syncytial Virus by PCR: NEGATIVE
SARS Coronavirus 2 by RT PCR: NEGATIVE

## 2024-07-19 LAB — D-DIMER, QUANTITATIVE: D-Dimer, Quant: 1.63 ug{FEU}/mL — ABNORMAL HIGH (ref 0.00–0.50)

## 2024-07-19 LAB — BRAIN NATRIURETIC PEPTIDE: B Natriuretic Peptide: >4500 pg/mL — ABNORMAL HIGH (ref 0.0–100.0)

## 2024-07-19 LAB — TROPONIN I (HIGH SENSITIVITY)
Troponin I (High Sensitivity): 39 ng/L — ABNORMAL HIGH (ref <18)
Troponin I (High Sensitivity): 39 ng/L — ABNORMAL HIGH (ref <18)

## 2024-07-19 MED ORDER — APIXABAN 5 MG PO TABS
5.0000 mg | ORAL_TABLET | Freq: Two times a day (BID) | ORAL | Status: DC
  Administered 2024-07-20 – 2024-07-21 (×4): 5 mg via ORAL
  Filled 2024-07-19 (×4): qty 1

## 2024-07-19 MED ORDER — FUROSEMIDE 10 MG/ML IJ SOLN
40.0000 mg | Freq: Once | INTRAMUSCULAR | Status: AC
  Administered 2024-07-19: 40 mg via INTRAVENOUS
  Filled 2024-07-19: qty 4

## 2024-07-19 MED ORDER — PANTOPRAZOLE SODIUM 40 MG PO TBEC
40.0000 mg | DELAYED_RELEASE_TABLET | Freq: Every day | ORAL | Status: DC
  Administered 2024-07-20 – 2024-07-27 (×8): 40 mg via ORAL
  Filled 2024-07-19 (×9): qty 1

## 2024-07-19 MED ORDER — SACUBITRIL-VALSARTAN 97-103 MG PO TABS
1.0000 | ORAL_TABLET | Freq: Two times a day (BID) | ORAL | Status: DC
  Administered 2024-07-20 (×2): 1 via ORAL
  Filled 2024-07-19 (×3): qty 1

## 2024-07-19 MED ORDER — IOHEXOL 350 MG/ML SOLN
75.0000 mL | Freq: Once | INTRAVENOUS | Status: AC | PRN
  Administered 2024-07-19: 75 mL via INTRAVENOUS

## 2024-07-19 MED ORDER — METOPROLOL SUCCINATE ER 50 MG PO TB24
50.0000 mg | ORAL_TABLET | Freq: Every day | ORAL | Status: DC
  Administered 2024-07-20: 50 mg via ORAL
  Filled 2024-07-19: qty 2

## 2024-07-19 NOTE — ED Provider Triage Note (Signed)
 Emergency Medicine Provider Triage Evaluation Note  VIAN FLUEGEL , a 63 y.o. male  was evaluated in triage.  Pt complains of shortness of breath x 1 week history of PFO, pulmonary embolus.  He is a smoker.  He has exertional dyspnea and mild wheezing, no fever or productive cough.  He states this feels the same as when he had a PE except that it does not cause him any pain.  No unilateral leg swelling  Review of Systems  Positive: Shortness of breath Negative: Fever  Physical Exam  BP (!) 129/101 (BP Location: Right Arm)   Pulse 92   Temp 98.2 F (36.8 C)   Resp 18   Ht 6' 1 (1.854 m)   Wt 75.8 kg   SpO2 95%   BMI 22.03 kg/m  Gen:   Awake, no distress   Resp:  Normal effort, increased lung sounds left lower lobe diminished right lower lobe MSK:   Moves extremities without difficulty  Other:    Medical Decision Making  Medically screening exam initiated at 2:24 PM.  Appropriate orders placed.  Hazaiah E Soltys was informed that the remainder of the evaluation will be completed by another provider, this initial triage assessment does not replace that evaluation, and the importance of remaining in the ED until their evaluation is complete.     Arloa Chroman, PA-C 07/19/24 1425

## 2024-07-19 NOTE — ED Notes (Signed)
 Called and placed PT on monitor with CCMD

## 2024-07-19 NOTE — ED Provider Notes (Signed)
 [place] EMERGENCY DEPARTMENT AT [facility] Provider Note  MDM   HPI/ROS:  Cody Joyce is a 63 y.o. male with PMH of COPD, PE, DVT (on Eliquis ), HFrEF, NSTEMI, CKD stage III who presents to the ED for shortness of breath.  He states that his shortness of breath has been worsening over the last 2 days, denies any cough or cold-like symptoms, reports that this feeling similar to when he had his PEs in the past.  Dx includes but is not limited to PE, infection, CHF exacerbation, COPD exacerbation, PTX, electrolyte abnormality  Physical exam is notable for: - Lungs with bilateral crackles -- No obvious lower extremity pitting edema  On my initial evaluation, patient is:  -Vital signs stable. Patient afebrile, hemodynamically stable, and non-toxic appearing.  Saturating 99% on RA but tachypneic  CXR with interstitial pulmonary edema.  CBC without leukocytosis, CMP with creatinine 1.38 (improved from baseline), elevated AST and ALT at 133 and 144, anion gap of 9.  Initial troponin 39, repeat troponin flat at 39.  Dimer elevated at 1.63.  BNP undetectably high >4500.  CT PE study Without evidence of PEs.  Small bilateral pleural effusions, pulmonary edema, reactive adenopathy. EKG sinus rhythm at a rate of 85, axis and intervals all WNL, no ST elevations or depressions concerning for acute ischemia.  40 mg Lasix  given.  Patient is prescribed torsemide at home but states that he has been noncompliant secondary to not having enough pills.  Patient thought to require admission for CHF exacerbation.  Interpretations, interventions, and the patient's course of care are documented below.   Disposition:  I discussed the case with family medicine who graciously agreed to admit the patient to their service for continued care.   Clinical Impression:  1. Acute on chronic congestive heart failure, unspecified heart failure type (HCC)   2. Hypoxia   3. SOB (shortness of breath)       Clinical Complexity A medically appropriate history, review of systems, and physical exam was performed.  My independent interpretations of EKG, labs, and radiology are documented in the ED course above.   If decision rules were used in this patient's evaluation, they are listed below.   Click here for ABCD2, HEART and other calculatorsREFRESH Note before signing   Patient's presentation is most consistent with acute complicated illness / injury requiring diagnostic workup.  Medical Decision Making Amount and/or Complexity of Data Reviewed Radiology: ordered.  Risk Prescription drug management. Decision regarding hospitalization.    HPI/ROS      See MDM section for pertinent HPI and ROS. A complete ROS was performed with pertinent positives/negatives noted above.   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    PFO (patent foramen ovale) 01/03/2020   Pulmonary embolism (HCC)  12/29/2019   Renal infarct     Past Surgical History:  Procedure Laterality Date   BUBBLE STUDY  01/03/2020   Procedure: BUBBLE STUDY;  Surgeon: Maranda Leim DEL, MD;  Location: [facility] ENDOSCOPY;  Service: Cardiovascular;;   IR ANGIOGRAM  PULMONARY BILATERAL SELECTIVE  12/30/2019   IR ANGIOGRAM SELECTIVE EACH ADDITIONAL VESSEL  12/30/2019   IR ANGIOGRAM SELECTIVE EACH ADDITIONAL VESSEL  12/30/2019   IR THROMBECT PRIM MECH ADD (INCLU) MOD SED  12/30/2019   IR THROMBECT PRIM MECH INIT (INCLU) MOD SED  12/30/2019   IR US  GUIDE VASC ACCESS RIGHT  12/30/2019   RIGHT/LEFT HEART CATH AND CORONARY ANGIOGRAPHY N/A 01/02/2020   Procedure: RIGHT/LEFT HEART CATH AND CORONARY ANGIOGRAPHY;  Surgeon: Claudene Victory ORN, MD;  Location: [facility] INVASIVE CV LAB;  Service: Cardiovascular;  Laterality: N/A;   TEE WITHOUT CARDIOVERSION N/A 01/03/2020   Procedure: TRANSESOPHAGEAL ECHOCARDIOGRAM (TEE)   DEFINITY  ;  Surgeon: Maranda Leim DEL, MD;  Location: [facility] ENDOSCOPY;  Service: Cardiovascular;  Laterality: N/A;      Physical Exam   Vitals:   07/19/24 1331 07/19/24 1340  BP: (!) 129/101   Pulse: 92   Resp: 18   Temp: 98.2 F (36.8 C)   SpO2: 95%   Weight:  75.8 kg  Height:  6' 1 (1.854 m)    Physical Exam Vitals and nursing note reviewed.  Constitutional:      General: He is not in acute distress.    Appearance: He is well-developed.  HENT:     Head: Normocephalic and atraumatic.  Eyes:     Conjunctiva/sclera: Conjunctivae normal.  Cardiovascular:     Rate and Rhythm: Normal rate and regular rhythm.     Heart sounds: No murmur heard. Pulmonary:     Effort: Pulmonary effort is normal.     Comments: Bilateral diffuse crackles Abdominal:     Palpations: Abdomen is soft.     Tenderness: There is no abdominal tenderness.  Musculoskeletal:        General: No swelling.     Cervical back: Neck supple.  Skin:    General: Skin is warm and dry.     Capillary Refill: Capillary refill takes less than 2 seconds.  Neurological:     Mental Status: He is alert.      Procedures   If procedures were preformed on this patient, they are listed below:  Procedures   Please note that this documentation was produced with the assistance of  voice-to-text technology and may contain errors.     Billy Pal, MD 07/19/24 1911    Bernard Drivers, MD 07/19/24 2230

## 2024-07-19 NOTE — Hospital Course (Addendum)
 Cody Joyce is a 63 y.o.male with a history of Emphysema/COPD, Heterotopic ossification, Alcohol dependence, CHF, HTN, PE on Eliquis , DVT, A-flutter, and CKD3a  who was admitted to the [facility] Medicine Teaching Service at [facility] for CHF exacerbation. His hospital course is detailed below:  CHF Exacerbation  Patient was presenting with worsening SOB starting 2 days ago. Reports taking lasix  once daily for the last week d/t feeling of fluid overloaded. Per patient he was diagnosed w/ CHF and has been receiving care at the [facility]. In the ED Pertinent labs include BNP >45000 Trp 39. Both of CTA PE and CXR indicated pulmonary edema. Echo during admission with worsening LVEF <20 and global hypokinesis. Cardiology was consulted  cardiac MRI, which was done 11/17 and showed LVEF 19%, mod LV dilatation. Inferior wall is akinetic with LGE and possible mural thrombus. LGE in coronary distrubution. Findings worrisome for CAD vs sarcoid vs myocarditis. RVEF 27%.. RHC completed on 11/17 which also shows  RA 13, PA 52/29 (39), PCW 26, Fick CO/CI 3.5/1.8, TD CO/CI 3.5/1.8, PAPi 1.8, PaSat 51%. Patient also had LHC on the follow w/ no CAD. Cardiology recommendation at discharge including to continue Entresto  24-26 mg BID, spiro 25 mg daily, digoxin  0.125 mg daily, Jardiance  10 mg daily. No BB with acute decompensation, consider adding OP. Plan for OP cardiac PET to r/o sarcoid vs previous myocarditis. He will also needs further workup OP for CTEPH with possible pulmonary angios.  Patient is not a candidate for heart tx at this time with ongoing tobacco and ETOH use. Motivated to quit. Need to consider advanced therapies (LVAD).   Hepatocellular injury LFTs elevated on admission and given cards concern for hepatic congestion in setting of heart failure, liver ultrasound was obtained which showed normal hepatic vasculature flow and no acute findings.  Liver enzymes improved throughout admission.  However, Hepatitis panel with  reactive HCV Ab and Hep B C IgM; Hep B Surface Ag, HCV RNA quant, and Hep B DNA PCR were negative  Mod mixed pulmonary HTN RHC as above. His VQ scan with large segmental diffusion defect in RML and medium sized segmental perfusion defect in lateral basal LLL. Nonspecific but may reflect age indeterminate pulmonary emboli vs obstructive pulmonary disease. Will plan to continue home Eliquis  5 mg BID   Other chronic conditions were medically managed with home medications and formulary alternatives as necessary (CKD, HTN, Hx PE, Alcohol dependence, GERD)  PCP Follow-up Recommendations: F/u LFTs (elevated on admission: AST 133, ALT 141) Ensure compliance with current medications/GDMT Ensure follow-up with cards; possible needs ICD placed outpatient Will need outpatient cardiac PET to further evaluate for possible cardiac sarcoid versus previous myocarditis.

## 2024-07-19 NOTE — ED Triage Notes (Signed)
 Patient reports shob for the past 2 days. Denies having and cold like symptoms. He reports that this feels similar to when he had a pulmonary embolism in the past. Denies chest pain.  He reports that he has COPD, CHF, Asthma and history of PE.

## 2024-07-19 NOTE — Assessment & Plan Note (Addendum)
-   Admit to FMTS, telemetry, attending Dr. Orie  - Vital signs per floor - Pain control: No pain on exam. We will continue to monitor - Daily weight - Strict I&O - Pending Echocardiogram to be completed - AM Labs: CBC, BMP, Mg  - Fall precautions - Delirium precautions - PT/OT consult

## 2024-07-19 NOTE — H&P (Cosign Needed Addendum)
 Hospital Admission History and Physical Service Pager: [phone]  Patient name: Cody Joyce Medical record number: [id number] Date of Birth: 14-Jan-1961 Age: 63 y.o. Gender: male  Primary Care Provider: [facility]  Consultants: None Code Status: Full Code which was confirmed with family if patient unable to confirm   Preferred Emergency Contact:  Extended Emergency Contact Information Primary Emergency Contact: Couts,Vickie Mobile Phone: [phone] Relation: Spouse   Chief Complaint: SOB  Differential and Medical Decision Making:  Cody Joyce is a 63 y.o. male PMH of Emphysema/COPD, Heterotopic ossification, Alcohol dependence,CHF, HTN, PE, DVT, A-flutter, and CKD 3a who presenting with worsening SOB and dyspnea.   Differential for this patient's presentation of this includes  CHF Exacerbation most likely Patient w/ Hx of CHF states recently had an Echo at the [facility] but not know his result. However his last Echo on file in 2021 shows LV w/ decrease fx EF 40-45%. Per patient had the similar episode in the past and was told by his cardiologist to take Lasix  PRN at home once feeling like fluid overload. Pt started taking Lasix  daily 7 days ago. However his SOB has not been significantly improved. In ED BNP >4500, CXR shows bilateral interstitial densities, most consistent with pulmonary edema. Lasix  was given in ED which significantly improved his SOB per patient  COPD exacerbation less likely. While pt w/ Hx of COPD w/ last smoking 2 days ago. CXR in ED most consistent w/ Pulmonary Edema. Bilateral lung bases diminished w/ fine crackles. No wheezing on auscultation. Pt on RA w/ no oxygen requirement to maintain Oxygen sat great than 88%  Pulmonary Embolism : Less likely Patient w/ Hx of PE, however he has been taking Eliquis  two time daily. CTA PE in ED show no evidence of pulmonary artery embolus. Assessment & Plan CHF exacerbation (HCC) - Admit to FMTS, telemetry,  attending Dr. Orie  - Vital signs per floor - Pain control: No pain on exam. We will continue to monitor - Daily weight - Strict I&O - Pending Echocardiogram to be completed - AM Labs: CBC, BMP, Mg  - Fall precautions - Delirium precautions - PT/OT consult  Chronic health problem CHF : Metoprolol  succinate 50 MG daily HTN : Entresto  49/57 PO BID PE : Eliquis  5 mg BID Alcohol dependence : On CIWA without ativan  Hx of smoking : Nicotine patch was offered but pt refused  FEN/GI: Heart Healthy diet VTE Prophylaxis:   Disposition: Med - tele  History of Present Illness:  Cody Joyce is a 63 y.o. male presenting with worsening SOB starting 2 days ago, while he was smoking- which prompted him to stop the last 2 days. Hx of PE, on Eliquis . Reports taking lasix  once daily for the last week d/t feeling of fluid overloaded, however he does not know the exact lasix  dosage. Reports adherence to his medications including metoprolol , Entresto , omeprazole. Per patient he was diagnosed w/ CHF and has been received care at the [facility]. He has been fully mobile at home.  In the ED, pt was hemodynamically stable. Pertinent labs include sCr 1.38 AST 133 ALT 141 GFR 57 BNP >45000 Trp 39. CTA PE negative for PE and both of CTA PE and CXR indicated pulmonary edema. Pt reports significant improvement in symptoms after receiving IV lasix  in ED.   Review Of Systems: Per HPI  Pertinent Past Medical History: Emphysema/COPD Heterotopic ossification Alcohol dependence   CHF  HTN  PE  DVT :  A-flutter CKD  3a  Remainder reviewed in history tab.   Pertinent Past Surgical History: Past Surgical History: No significant past history of surgery Remainder reviewed in history tab.   Pertinent Social History: Tobacco use: 2 blank n milds a day. Reports quit for 2 days now. Alcohol use: Drinks on weekends, 1 beer yesterday Other Substance use: None Lives alone  Pertinent Family History: None  contributing  Important Outpatient Medications: Metoprolol  succinate 50 MG daily Entresto  49/57 PO BID Eliquis  2.5 BID   Objective: BP (!) 142/113   Pulse 93   Temp 98.3 F (36.8 C) (Oral)   Resp (!) 23   Ht 6' 1 (1.854 m)   Wt 75.8 kg   SpO2 94%   BMI 22.03 kg/m  Exam: General: Non toxic Cardiovascular: S1S2 RRR Respiratory: Bilateral diminish at base, non distress on RA Gastrointestinal: Soft non tender Neuro: Intact, Ox4 Psych: appropriate behavior and judgement  Ex : bilateral LE +1 pitting edema  Labs:  CBC BMET  Recent Labs  Lab 07/19/24 1324  WBC 8.1  HGB 15.0  HCT 42.3  PLT 173   Recent Labs  Lab 07/19/24 1324  NA 143  K 4.5  CL 110  CO2 24  BUN 18  CREATININE 1.38*  GLUCOSE 118*  CALCIUM 8.4*     EKG:  Vent. rate 85 BPM PR interval 138 ms QRS duration 80 ms QT/QTcB 408/485 ms P-R-T axes 62 100 100   Imaging Studies Performed: Narrative & Impression EXAM: 2 VIEW(S) XRAY OF THE CHEST 07/19/2024 03:06:09 PM IMPRESSION: 1. Bilateral interstitial densities, most consistent with pulmonary edema.   Electronically signed by: Lynwood Seip MD 07/19/2024 03:28 PM EST RP Workstation: [id number]  Narrative & Impression CLINICAL DATA:  Concern for point embolism.  Positive D-dimer.   EXAM: CT ANGIOGRAPHY CHEST WITH CONTRAST  IMPRESSION: 1. No CT evidence of pulmonary artery embolus. 2. Cardiomegaly with findings of CHF and small bilateral pleural effusions. 3. Areas of ground-glass airspace densities throughout the lungs likely represent edema. Pneumonia is not excluded. 4. Mediastinal and hilar adenopathy, likely reactive. 5.  Emphysema (ICD10-J43.9).     Electronically Signed   By: Vanetta Chou M.D.   On: 07/19/2024 17:59   Suzen Houston NOVAK, DO 07/19/2024, 7:37 PM PGY-1, [facility]  FPTS Intern pager: [phone], text pages welcome Secure chat group [facility] Teaching Service   Upper  Level Addendum: I have seen and evaluated this patient along with Dr. Coralee and reviewed the above note, making necessary revisions as appropriate. I agree with the medical decision making and physical exam as noted above. Gladis Church, DO PGY-3 [facility] Residency

## 2024-07-19 NOTE — Assessment & Plan Note (Addendum)
 CHF : Metoprolol  succinate 50 MG daily HTN : Entresto  49/57 PO BID PE : Eliquis  5 mg BID Alcohol dependence : On CIWA without ativan  Hx of smoking : Nicotine patch was offered but pt refused

## 2024-07-19 NOTE — Plan of Care (Signed)
 Paged at [facility] by ED for admission.   Per EDP, patient with hx CKD, HFrEF, PE noncompliant with Eliquis  due to finances, presenting to ED for SOB x3-4 days. PE workup here negative, found to have bilateral pleural effusions and elevated BNP, admitting for ADHF.   Went to see patient at bedside. Patient in no acute distress. Speaking in full sentences. Reports feeling better than when he presented. No chest pain. Satting well on room air. Bilateral basilar crackles present on exam. Normal heart rate. Patient overall stable for admission by night team.

## 2024-07-20 ENCOUNTER — Ambulatory Visit: Admitting: Gastroenterology

## 2024-07-20 ENCOUNTER — Inpatient Hospital Stay (HOSPITAL_COMMUNITY)

## 2024-07-20 DIAGNOSIS — I509 Heart failure, unspecified: Secondary | ICD-10-CM

## 2024-07-20 DIAGNOSIS — N1831 Chronic kidney disease, stage 3a: Secondary | ICD-10-CM | POA: Insufficient documentation

## 2024-07-20 DIAGNOSIS — I5023 Acute on chronic systolic (congestive) heart failure: Secondary | ICD-10-CM

## 2024-07-20 LAB — ECHOCARDIOGRAM COMPLETE
Area-P 1/2: 4.68 cm2
Calc EF: 23.1 %
Est EF: <20
Height: 73 in
MV M vel: 4.37 m/s
MV Peak grad: 76.4 mmHg
Radius: 0.4 cm
S' Lateral: 5 cm
Single Plane A2C EF: 22.8 %
Single Plane A4C EF: 29.3 %
Weight: 2672 [oz_av]

## 2024-07-20 LAB — BASIC METABOLIC PANEL WITH GFR
Anion gap: 10 (ref 5–15)
BUN: 14 mg/dL (ref 8–23)
CO2: 21 mmol/L — ABNORMAL LOW (ref 22–32)
Calcium: 8.2 mg/dL — ABNORMAL LOW (ref 8.9–10.3)
Chloride: 109 mmol/L (ref 98–111)
Creatinine, Ser: 1.4 mg/dL — ABNORMAL HIGH (ref 0.61–1.24)
GFR, Estimated: 56 mL/min — ABNORMAL LOW (ref >60)
Glucose, Bld: 99 mg/dL (ref 70–99)
Potassium: 4.1 mmol/L (ref 3.5–5.1)
Sodium: 140 mmol/L (ref 135–145)

## 2024-07-20 LAB — HIV ANTIBODY (ROUTINE TESTING W REFLEX): HIV Screen 4th Generation wRfx: NONREACTIVE

## 2024-07-20 LAB — MAGNESIUM: Magnesium: 1.7 mg/dL (ref 1.7–2.4)

## 2024-07-20 MED ORDER — PERFLUTREN LIPID MICROSPHERE
1.0000 mL | INTRAVENOUS | Status: AC | PRN
  Administered 2024-07-20: 4 mL via INTRAVENOUS

## 2024-07-20 MED ORDER — POLYETHYLENE GLYCOL 3350 17 G PO PACK
17.0000 g | PACK | Freq: Every day | ORAL | Status: DC
  Administered 2024-07-20: 17 g via ORAL
  Filled 2024-07-20 (×4): qty 1

## 2024-07-20 MED ORDER — IBUPROFEN 400 MG PO TABS: 400.0000 mg | ORAL_TABLET | Freq: Four times a day (QID) | ORAL | Status: DC | PRN

## 2024-07-20 MED ORDER — ACETAMINOPHEN 325 MG PO TABS
650.0000 mg | ORAL_TABLET | Freq: Four times a day (QID) | ORAL | Status: DC | PRN
  Administered 2024-07-20: 650 mg via ORAL
  Filled 2024-07-20: qty 2

## 2024-07-20 MED ORDER — MAGNESIUM SULFATE 2 GM/50ML IV SOLN
2.0000 g | Freq: Once | INTRAVENOUS | Status: AC
  Administered 2024-07-20: 2 g via INTRAVENOUS
  Filled 2024-07-20: qty 50

## 2024-07-20 MED ORDER — MELATONIN 3 MG PO TABS
3.0000 mg | ORAL_TABLET | Freq: Every day | ORAL | Status: DC
  Administered 2024-07-20 – 2024-07-26 (×7): 3 mg via ORAL
  Filled 2024-07-20 (×7): qty 1

## 2024-07-20 MED ORDER — OXYCODONE HCL 5 MG PO TABS
5.0000 mg | ORAL_TABLET | Freq: Four times a day (QID) | ORAL | Status: DC | PRN
  Administered 2024-07-20: 5 mg via ORAL
  Filled 2024-07-20: qty 1

## 2024-07-20 MED ORDER — FUROSEMIDE 10 MG/ML IJ SOLN
40.0000 mg | Freq: Once | INTRAMUSCULAR | Status: AC
  Administered 2024-07-20: 40 mg via INTRAVENOUS
  Filled 2024-07-20: qty 4

## 2024-07-20 MED ORDER — METOPROLOL SUCCINATE ER 25 MG PO TB24
25.0000 mg | ORAL_TABLET | Freq: Every day | ORAL | Status: DC
  Administered 2024-07-21: 25 mg via ORAL
  Filled 2024-07-20 (×2): qty 1

## 2024-07-20 MED ORDER — HYDROXYZINE HCL 25 MG PO TABS
25.0000 mg | ORAL_TABLET | Freq: Once | ORAL | Status: AC
  Administered 2024-07-20: 25 mg via ORAL
  Filled 2024-07-20: qty 1

## 2024-07-20 MED ORDER — SACUBITRIL-VALSARTAN 24-26 MG PO TABS
1.0000 | ORAL_TABLET | Freq: Two times a day (BID) | ORAL | Status: DC
  Administered 2024-07-20 – 2024-07-27 (×14): 1 via ORAL
  Filled 2024-07-20 (×14): qty 1

## 2024-07-20 MED ORDER — SENNOSIDES-DOCUSATE SODIUM 8.6-50 MG PO TABS
1.0000 | ORAL_TABLET | Freq: Every day | ORAL | Status: DC
  Filled 2024-07-20 (×7): qty 1

## 2024-07-20 NOTE — Assessment & Plan Note (Addendum)
 Labs and imaging consistent with CHF exacerbation. EF 40-45%. S/p IV Lasix , SOB improved, crackles on examination. Home GDMT restarted, AM Magnesium  1.7, repleted - IV lasix  40 mg once - Strict I's & O's  - Repeat echo pending - AM Labs: CBC, BMP, Mg  - PT/OT consult

## 2024-07-20 NOTE — TOC CM/SW Note (Addendum)
 Transition of Care [facility]) - Inpatient Brief Assessment   Patient Details  Name: GLOVER CAPANO MRN: [id number] Date of Birth: 1961-03-27  Transition of Care [facility]) CM/SW Contact:    Waddell Barnie Rama, RN Phone Number: 07/20/2024, 3:23 PM   Clinical Narrative: From home with roommate,  has PCP Dr. Benedict Nicky Blanch and insurance on file, states has no [facility] services in place at this time , has a cane at home.  States family member (niece or neighbor)  will transport them home at [employer] and family is support system, states gets medications from [facility].  Pta self ambulatory.  Patient states he has a bill of over 1000 for medications that he can not afford.  This NCM called the [facility]  billing [phone], spoke with Brad.  He states they will send him a waiver form in the mail for him to fill out , he should not worry about the bill right now just let them fill the medications at the [facility], just fill out the form and if he does not qualify for the waiver then he can set up a payment plan that he can afford.  This NCM gave patient the billing phone number to keep in contact with.   Per [facility] his PCP is Dr. Benedict Nicky Blanch, CSW is Zenaida Solian 336 [phone], ext [phone]. NCM asked patient if he wants [facility] resources he said to add it to his dc paperwork.    Transition of Care Asessment: Insurance and Status: Insurance coverage has been reviewed Patient has primary care physician: Yes Home environment has been reviewed: home with a room mate Prior level of function:: indep Prior/Current Home Services: Current home services (cane) Social Drivers of Health Review: SDOH reviewed no interventions necessary Readmission risk has been reviewed: Yes Transition of care needs: transition of care needs identified, TOC will continue to follow

## 2024-07-20 NOTE — Progress Notes (Signed)
     Daily Progress Note Intern Pager: [phone]  Patient name: Cody Joyce Medical record number: [id number] Date of birth: May 03, 1961 Age: 63 y.o. Gender: male  Primary Care Provider: [facility] Consultants: None Code Status: Full code confirmed with patient  Pt Overview and Major Events to Date:  11/13: Admitted to FMTS  Assessment and Plan: Ala Capri is a 63 year old male with a PMHx of CKD, HFrEF, PE noncompliant with Eliquis  due to finances, who presented with worsening SOB for 3-4 days and dyspnea.  Assessment & Plan Acute decompensated heart failure (HCC) CHF exacerbation (HCC) Labs and imaging consistent with CHF exacerbation. EF 40-45%. S/p IV Lasix , SOB improved, crackles on examination. Home GDMT restarted, AM Magnesium  1.7, repleted - IV lasix  40 mg once - Strict I's & O's  - Repeat echo pending - AM Labs: CBC, BMP, Mg  - PT/OT consult  Chronic kidney disease, stage 3a (HCC) Cr 1.40, Baseline around 1.5 - Monitor - AM BMP Chronic health problem CHF : Metoprolol  succinate 50 MG daily HTN : Entresto  49/57 PO BID PE : Eliquis  5 mg BID Alcohol dependence : On CIWA without ativan  Hx of smoking : Nicotine patch was offered but pt refused  FEN/GI: Heart healthy diet PPx: Eliquis  5mg   Dispo:Home in 2-3 days.  Subjective:  Patient found sitting up in bed in no acute distress.  Speaking in full sentences with no increased work of breathing.  Reports that he is feeling significantly better.  Satting well on room air.  Objective: Temp:  [97.7 F (36.5 C)-98.3 F (36.8 C)] 97.7 F (36.5 C) (11/14 0720) Pulse Rate:  [83-95] 90 (11/14 0922) Resp:  [17-31] 21 (11/14 0915) BP: (127-148)/(81-113) 136/101 (11/14 0922) SpO2:  [91 %-100 %] 95 % (11/14 0915) Weight:  [75.8 kg] 75.8 kg (11/13 1340)  Physical Exam: General: Well-appearing, no acute distress Cardio: Regular rate, regular rhythm, no murmurs on exam. Pulm: Bilateral diffuse crackles,  pulmonary effort normal Abdominal: bowel sounds present, soft, non-tender, non-distended Extremities: no peripheral edema   Laboratory: Most recent CBC Lab Results  Component Value Date   WBC 8.1 07/19/2024   HGB 15.0 07/19/2024   HCT 42.3 07/19/2024   MCV 85.6 07/19/2024   PLT 173 07/19/2024   Most recent BMP    Latest Ref Rng & Units 07/20/2024    3:52 AM  BMP  Glucose 70 - 99 mg/dL 99   BUN 8 - 23 mg/dL 14   Creatinine 9.38 - 1.24 mg/dL 8.59   Sodium 864 - 854 mmol/L 140   Potassium 3.5 - 5.1 mmol/L 4.1   Chloride 98 - 111 mmol/L 109   CO2 22 - 32 mmol/L 21   Calcium 8.9 - 10.3 mg/dL 8.2    Troponin: 39 Resp Panel Neg BNP >4,5000 D-Dimer: 1.63   Imaging/Diagnostic Tests: CTA: No CT evidence of pulmonary artery embolus.  Cardiomegaly with findings of CHF and small bilateral pleural effusions.  Is a ground glass airspace densities throughout the lungs likely represent edema.  Pneumonia is not excluded.  Metastatic panel and hilar adenopathy, likely reactive.  Emphysema  Elodie Palma, MD 07/20/2024, 10:55 AM  PGY-1, [facility] FPTS Intern pager: [phone], text pages welcome Secure chat group [facility] Teaching Service

## 2024-07-20 NOTE — Plan of Care (Signed)
 Received secure chat from Dr. Kriste about TTE findings: EF <20%, severe global hypokinesis, and sluggish flow at the apex.   Called on call cardiologist Dr. Michele to discuss case management. He recommends the following:  - focusing on diuresing d/t BNP >4500 on admission - cut back on Metoprolol  to 25 mg daily - cut back on Entresto  to 24-26 mg daily to aide in BP support while diuresing.  - can consider formal cardiology consult for LHC/RHC  - order Lasix  40 mg IV to continue diuresis   Fortunately patient has been on Eliquis  5 mg BID for prior PE diagnosed in 2021. Continue anticoagulation.   Called floor RN, although patient has had strict I&O since admission, no output has been charted. New floor RN has just taken over this patient at 4 PM, she will start charting outputs for the rest of her shift.  Continue daily weights   Damien Pinal, DO [facility], PGY-3 07/20/24 5:59 PM

## 2024-07-20 NOTE — Assessment & Plan Note (Addendum)
 Cr 1.40, Baseline around 1.5 - Monitor - AM BMP

## 2024-07-20 NOTE — Progress Notes (Signed)
 Echocardiogram 2D Echocardiogram has been performed.  Cody Joyce 07/20/2024, 1:37 PM

## 2024-07-20 NOTE — Assessment & Plan Note (Addendum)
 CHF : Metoprolol  succinate 50 MG daily HTN : Entresto  49/57 PO BID PE : Eliquis  5 mg BID Alcohol dependence : On CIWA without ativan  Hx of smoking : Nicotine patch was offered but pt refused

## 2024-07-21 ENCOUNTER — Inpatient Hospital Stay (HOSPITAL_COMMUNITY)

## 2024-07-21 ENCOUNTER — Encounter (HOSPITAL_COMMUNITY): Payer: Self-pay | Admitting: Student

## 2024-07-21 DIAGNOSIS — I5023 Acute on chronic systolic (congestive) heart failure: Secondary | ICD-10-CM

## 2024-07-21 DIAGNOSIS — N1831 Chronic kidney disease, stage 3a: Secondary | ICD-10-CM

## 2024-07-21 LAB — HEPATITIS PANEL, ACUTE
HCV Ab: REACTIVE — AB
Hep A IgM: NONREACTIVE
Hep B C IgM: REACTIVE — AB
Hepatitis B Surface Ag: NONREACTIVE

## 2024-07-21 LAB — COMPREHENSIVE METABOLIC PANEL WITH GFR
ALT: 107 U/L — ABNORMAL HIGH (ref 0–44)
AST: 58 U/L — ABNORMAL HIGH (ref 15–41)
Albumin: 2.1 g/dL — ABNORMAL LOW (ref 3.5–5.0)
Alkaline Phosphatase: 82 U/L (ref 38–126)
Anion gap: 9 (ref 5–15)
BUN: 20 mg/dL (ref 8–23)
CO2: 24 mmol/L (ref 22–32)
Calcium: 8 mg/dL — ABNORMAL LOW (ref 8.9–10.3)
Chloride: 105 mmol/L (ref 98–111)
Creatinine, Ser: 1.39 mg/dL — ABNORMAL HIGH (ref 0.61–1.24)
GFR, Estimated: 57 mL/min — ABNORMAL LOW
Glucose, Bld: 118 mg/dL — ABNORMAL HIGH (ref 70–99)
Potassium: 3.9 mmol/L (ref 3.5–5.1)
Sodium: 138 mmol/L (ref 135–145)
Total Bilirubin: 1.3 mg/dL — ABNORMAL HIGH (ref 0.0–1.2)
Total Protein: 6.3 g/dL — ABNORMAL LOW (ref 6.5–8.1)

## 2024-07-21 LAB — APTT: aPTT: 43 s — ABNORMAL HIGH (ref 24–36)

## 2024-07-21 LAB — HEPARIN LEVEL (UNFRACTIONATED): Heparin Unfractionated: >1.1 [IU]/mL — ABNORMAL HIGH (ref 0.30–0.70)

## 2024-07-21 LAB — MAGNESIUM: Magnesium: 1.7 mg/dL (ref 1.7–2.4)

## 2024-07-21 MED ORDER — FUROSEMIDE 10 MG/ML IJ SOLN
40.0000 mg | Freq: Once | INTRAMUSCULAR | Status: AC
  Administered 2024-07-21: 40 mg via INTRAVENOUS
  Filled 2024-07-21: qty 4

## 2024-07-21 MED ORDER — METOPROLOL TARTRATE 12.5 MG HALF TABLET
12.5000 mg | ORAL_TABLET | Freq: Two times a day (BID) | ORAL | Status: DC
  Administered 2024-07-21 – 2024-07-23 (×5): 12.5 mg via ORAL
  Filled 2024-07-21 (×5): qty 1

## 2024-07-21 MED ORDER — HEPARIN (PORCINE) 25000 UT/250ML-% IV SOLN
1600.0000 [IU]/h | INTRAVENOUS | Status: DC
  Administered 2024-07-21: 1150 [IU]/h via INTRAVENOUS
  Administered 2024-07-22: 1300 [IU]/h via INTRAVENOUS
  Filled 2024-07-21 (×2): qty 250

## 2024-07-21 MED ORDER — SPIRONOLACTONE 12.5 MG HALF TABLET
12.5000 mg | ORAL_TABLET | Freq: Every day | ORAL | Status: DC
  Administered 2024-07-21: 12.5 mg via ORAL
  Filled 2024-07-21: qty 1

## 2024-07-21 MED ORDER — MAGNESIUM SULFATE 4 GM/100ML IV SOLN
4.0000 g | Freq: Once | INTRAVENOUS | Status: AC
  Administered 2024-07-21: 4 g via INTRAVENOUS
  Filled 2024-07-21: qty 100

## 2024-07-21 MED ORDER — POTASSIUM CHLORIDE CRYS ER 20 MEQ PO TBCR
20.0000 meq | EXTENDED_RELEASE_TABLET | Freq: Once | ORAL | Status: AC
  Administered 2024-07-21: 20 meq via ORAL
  Filled 2024-07-21: qty 1

## 2024-07-21 NOTE — Assessment & Plan Note (Addendum)
 Stable on room air. Down 10 lbs since admission. Admission echo with worsening LVEF <20, cardiology consulted.  - Appreciate Cardiology recommendations  - S/p 80 IV lasix  yesterday - redose as indicated - Cont metoprolol  25 daily  - Cont entresto  24-26 mg BID  - Strict I/O, daily weight  - CBC, BMP, Mg - replete electrolytes as indicated, goal K>4, Mg>2 - PT/OT eval and treat

## 2024-07-21 NOTE — Assessment & Plan Note (Addendum)
 HTN : Entresto  24-26 PO BID PE : Eliquis  5 mg BID Alcohol dependence : CIWAs unremarkable during admission Hx of smoking : Nicotine patch was offered but pt refused

## 2024-07-21 NOTE — Evaluation (Signed)
 Physical Therapy Evaluation and Discharge Patient Details Name: Cody Joyce MRN: [id number] DOB: 26-Mar-1961 Today's Date: 07/21/2024  History of Present Illness  63 y.o. male admitted 11/13 for Acute decompensated heart failure (HCC)  CHF exacerbation. PMH:  HFrEF, CKD, PE  Clinical Impression  Patient evaluated by Physical Therapy with no further acute PT needs identified. Previously independent, doing handyman work in and around houses. States he feels close to baseline. He was able to ambulate at a mod I level and is low fall risk based on DGI. Pt did have a drop in SpO2 to 84% on RA, with mild dyspnea, recovered within about 4 minutes >88% with seated rest and breathing techniques. RN notified. Suggest follow-up ambulatory sat be completed by staff prior to d/c to determine if home O2 needed. All education has been completed and the patient has no further questions. No follow-up PT indicated based on level of function today. See below for any follow-up Physical Therapy or equipment needs. PT is signing off. Thank you for this referral.         If plan is discharge home, recommend the following: Assist for transportation   Can travel by private vehicle        Equipment Recommendations None recommended by PT  Recommendations for Other Services       Functional Status Assessment Patient has had a recent decline in their functional status and demonstrates the ability to make significant improvements in function in a reasonable and predictable amount of time.     Precautions / Restrictions Precautions Precautions: None Restrictions Weight Bearing Restrictions Per Provider Order: No      Mobility  Bed Mobility Overal bed mobility: Independent                  Transfers Overall transfer level: Independent Equipment used: None                    Ambulation/Gait Ambulation/Gait assistance: Modified independent (Device/Increase time) Gait Distance (Feet): 200  Feet Assistive device: None Gait Pattern/deviations: WFL(Within Functional Limits) Gait velocity: dec Gait velocity interpretation: 1.31 - 2.62 ft/sec, indicative of limited community ambulator   General Gait Details: Minimal instability with gait, no AD. tolerated dynamic challenges without LOB. Minimal dyspnea however SpO2 dropped to 84% on RA by the time he returned to room. Improved with cues for breathing techniques and seated rest for approx 4 minutes rising above 88%. RN notified.  Stairs            Wheelchair Mobility     Tilt Bed    Modified Rankin (Stroke Patients Only)       Balance Overall balance assessment: Modified Independent                               Standardized Balance Assessment Standardized Balance Assessment : Dynamic Gait Index   Dynamic Gait Index Level Surface: Normal Change in Gait Speed: Normal Gait with Horizontal Head Turns: Normal Gait with Vertical Head Turns: Normal Gait and Pivot Turn: Normal Step Over Obstacle: Normal Step Around Obstacles: Normal Steps: Mild Impairment Total Score: 23       Pertinent Vitals/Pain Pain Assessment Pain Assessment: No/denies pain    Home Living Family/patient expects to be discharged to:: Private residence Living Arrangements: Non-relatives/Friends Available Help at Discharge: Other (Comment) (Roommates) Type of Home: House Home Access: Level entry       Home Layout: Laundry or  work area in [employer] Equipment: [place] - single point      Prior Function Prior Level of Function : Independent/Modified Independent;Driving             Mobility Comments: Ind, occasional use of SPC, denies falls ADLs Comments: Ind, does odd jobs     Extremity/Trunk Assessment   Upper Extremity Assessment Upper Extremity Assessment: Defer to OT evaluation    Lower Extremity Assessment Lower Extremity Assessment: Overall WFL for tasks assessed       Communication    Communication Communication: No apparent difficulties    Cognition Arousal: Alert Behavior During Therapy: WFL for tasks assessed/performed   PT - Cognitive impairments: No apparent impairments                         Following commands: Intact       Cueing Cueing Techniques: Verbal cues     General Comments General comments (skin integrity, edema, etc.): SpO2 92% at rest, 84% when ambulating, on room air. Recovered with rest and breathing techniques within about 4 minutes >88%. Minimal dyspnea noted. RN notified.    Exercises     Assessment/Plan    PT Assessment Patient does not need any further PT services  PT Problem List         PT Treatment Interventions      PT Goals (Current goals can be found in the Care Plan section)  Acute Rehab PT Goals Patient Stated Goal: Feel better PT Goal Formulation: All assessment and education complete, DC therapy    Frequency       Co-evaluation               AM-PAC PT 6 Clicks Mobility  Outcome Measure Help needed turning from your back to your side while in a flat bed without using bedrails?: None Help needed moving from lying on your back to sitting on the side of a flat bed without using bedrails?: None Help needed moving to and from a bed to a chair (including a wheelchair)?: None Help needed standing up from a chair using your arms (e.g., wheelchair or bedside chair)?: None Help needed to walk in hospital room?: None Help needed climbing 3-5 steps with a railing? : None 6 Click Score: 24    End of Session   Activity Tolerance: Patient tolerated treatment well Patient left: in bed;with call bell/phone within reach;with bed alarm set;with family/visitor present;with nursing/sitter in room Nurse Communication: Mobility status (Sats 84% ambulating on RA.) PT Visit Diagnosis: Other abnormalities of gait and mobility (R26.89)    Time: 8595-8579 PT Time Calculation (min) (ACUTE ONLY): 16  min   Charges:   PT Evaluation $PT Eval Low Complexity: 1 Low   PT General Charges $$ ACUTE PT VISIT: 1 Visit         Leontine Roads, PT, DPT [facility]  Rehabilitation Services Physical Therapist Office: [phone] Website: [email or web address]   Leontine GORMAN Roads 07/21/2024, 2:47 PM

## 2024-07-21 NOTE — Plan of Care (Signed)
  Problem: Health Behavior/Discharge Planning: Goal: Ability to manage health-related needs will improve Outcome: Progressing   Problem: Clinical Measurements: Goal: Ability to maintain clinical measurements within normal limits will improve Outcome: Progressing Goal: Cardiovascular complication will be avoided Outcome: Progressing   Problem: Coping: Goal: Level of anxiety will decrease Outcome: Progressing   

## 2024-07-21 NOTE — Progress Notes (Signed)
     Daily Progress Note Intern Pager: [phone]  Patient name: Cody Joyce Medical record number: [id number] Date of birth: 1961-05-10 Age: 63 y.o. Gender: male  Primary Care Provider: [facility] Consultants: None Code Status: Full  Pt Overview and Major Events to Date:  11/13: Admitted to [facility]MT[facility]  Medical Decision Making:  Cody Joyce 63 y.o. with hx HFrEF, CKD, PE admitted for acute decompensated heart failure in setting of limited medication adherence due to finances. Cardiology providing recommendations for care.  Assessment & Plan Acute decompensated heart failure (HCC) CHF exacerbation (HCC) Stable on room air. Down 10 lbs since admission. Admission echo with worsening LVEF <20, cardiology consulted.  - Appreciate Cardiology recommendations  - S/p 80 IV lasix  yesterday - redose as indicated - Cont metoprolol  25 daily  - Cont entresto  24-26 mg BID  - Strict I/O, daily weight  - CBC, BMP, Mg - replete electrolytes as indicated, goal K>4, Mg>2 - PT/OT eval and treat Chronic kidney disease, stage 3a (HCC) Creatinine remains around baseline. - CTM - AM BMP Chronic health problem HTN : Entresto  24-26 PO BID PE : Eliquis  5 mg BID Alcohol dependence : CIWAs unremarkable during admission Hx of smoking : Nicotine patch was offered but pt refused   FEN/GI: Heart healthy PPx: Eliquis  5 BID Dispo: Home pending clinical stabilty  Subjective:  Reports feeling better this morning. Breathing improved. Able to ambulate around more.   Objective: Temp:  [97.5 F (36.4 C)-98.8 F (37.1 C)] 98.8 F (37.1 C) (11/15 0011) Pulse Rate:  [85-95] 95 (11/15 0011) Resp:  [16-27] 17 (11/15 0011) BP: (115-149)/(79-116) 115/79 (11/15 0011) SpO2:  [90 %-98 %] 96 % (11/15 0409) Weight:  [71.2 kg] 71.2 kg (11/15 0532) Physical Exam: General: No acute distress. Resting comfortably.  Cardiovascular: S1/S2, no extra heart sounds. Warm and well-perfused.  Respiratory:  Breathing comfortably on room air. Largely CTAB with faint intermittent crackle of lower lobes. No increased WOB.  Abdomen: Soft, nontender, nondistended.  Extremities: No tenderness of swelling of BLE.   Laboratory: Most recent CBC Lab Results  Component Value Date   WBC 8.1 07/19/2024   HGB 15.0 07/19/2024   HCT 42.3 07/19/2024   MCV 85.6 07/19/2024   PLT 173 07/19/2024   Most recent BMP    Latest Ref Rng & Units 07/21/2024    2:21 AM  BMP  Glucose 70 - 99 mg/dL 881   BUN 8 - 23 mg/dL 20   Creatinine 9.38 - 1.24 mg/dL 8.60   Sodium 864 - 854 mmol/L 138   Potassium 3.5 - 5.1 mmol/L 3.9   Chloride 98 - 111 mmol/L 105   CO2 22 - 32 mmol/L 24   Calcium 8.9 - 10.3 mg/dL 8.0    Mg 1.7  Cody Perkins, MD 07/21/2024, 7:33 AM  PGY-2, [facility] FPTS Intern pager: [phone], text pages welcome Secure chat group [facility] Teaching Service

## 2024-07-21 NOTE — Plan of Care (Signed)
  Problem: Nutrition: Goal: Adequate nutrition will be maintained Outcome: Completed/Met   Problem: Coping: Goal: Level of anxiety will decrease Outcome: Completed/Met   Problem: Elimination: Goal: Will not experience complications related to bowel motility Outcome: Completed/Met Goal: Will not experience complications related to urinary retention Outcome: Completed/Met   Problem: Pain Managment: Goal: General experience of comfort will improve and/or be controlled Outcome: Completed/Met   Problem: Safety: Goal: Ability to remain free from injury will improve Outcome: Completed/Met   Problem: Skin Integrity: Goal: Risk for impaired skin integrity will decrease Outcome: Completed/Met

## 2024-07-21 NOTE — Consult Note (Addendum)
 Cardiology Consultation   Patient ID: Cody Joyce MRN: [id number]; DOB: [birth year]  Admit date: 07/19/2024 Date of Consult: 07/21/2024  PCP:  [facility], [facility]   [facility] Providers Cardiologist:  Jerel Balding, MD   Patient Profile: Cody Joyce is a 63 y.o. 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  History of Present Illness: Cody Joyce has hospitalization 12/2019 where he was admitted with bilateral submassive PEs with DVT.  Also had multiple left renal cortical infarcts.  Eventually went thrombectomy of both pulmonary arteries.  EF was 35-40% with global hypokinesis, with severely reduced RV function/enlargement.  There was also large PFO with bidirectional shunting felt secondary to the pulmonary embolism and right heart strain.  Underwent cardiac catheterization that demonstrated normal coronary arteries.  Additionally during that admission had paroxysmal atrial flutter however without any documented recurrences.  Repeat echocardiogram 03/2020 showed improvement in EF 40 to 45% with global hypokinesis and recovery of RV function.  05/2020 echocardiogram with EF 40 to 45% with no shunting.  He was last seen in office 06/2022 and feeling great, reported NYHA class I symptoms and physically active/jogging.  Did not require diuretics.  Currently patient being evaluated for CHF exacerbation.  Echocardiogram this admission demonstrates EF less than 20% with sluggish flow at the apex but did not specifically outline LV thrombus.  Global hypokinesis with grade 3 diastolic dysfunction.  BNP greater than 4500.  CTA negative for PE but with vascular congestion and small bilateral pleural effusions.  He has  been given IV Lasix  40 mg daily.  Patient reports that he has not really been receiving cardiology care since he saw us .  He reports he does get some care at the [facility] but only seen every 6 months.  He reports that they work doing much, thinks he might of had an echocardiogram done recently in the summer but I do not see any reports of this and unable to see any office notes about cardiology.  In brief, he reports that he is normally very active and does all of his housework independently.  Reports he does things with scrap metal and home projects and moving refrigerators without any difficulty.  He was working on a house project with his grandson about a week ago and started noticing increased shortness of breath without associated orthopnea, PND, chest pain, dizziness, falls.  Did report mild ankle edema in which he did take a dose of Lasix  in which he never uses.  He also reports intermittent compliancy with all of his medications due to financial issues.  Reports having issues of panic attacks and anxiety for the past week as well.  Currently now much improved and not complaining of any shortness of breath.  Denies any history of drug use.  He was smoking Black and milds but quit earlier this week.  Drinks about 2 beers daily but reports not being a heavy drinker.  Reports father had MI previously but unclear than heart failure history.  He lives alone.  Troponin 34-39.  Respiratory panel negative.  Potassium 3.9, creatinine 1.39.  Albumin 3.1.  Mildly elevated LFTs.  Hemoglobin 15.   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    PFO (patent foramen ovale) 01/03/2020   Pulmonary embolism (HCC) 12/29/2019   Renal infarct     Past Surgical History:  Procedure Laterality Date   BUBBLE STUDY  01/03/2020   Procedure: BUBBLE STUDY;  Surgeon: Maranda Leim DEL, MD;  Location: [facility] ENDOSCOPY;  Service: Cardiovascular;;   IR ANGIOGRAM PULMONARY BILATERAL SELECTIVE  12/30/2019   IR ANGIOGRAM SELECTIVE EACH ADDITIONAL VESSEL  12/30/2019   IR ANGIOGRAM SELECTIVE EACH ADDITIONAL VESSEL  12/30/2019   IR THROMBECT PRIM MECH ADD (INCLU) MOD SED  12/30/2019   IR THROMBECT PRIM MECH INIT (INCLU) MOD SED  12/30/2019   IR US  GUIDE VASC ACCESS RIGHT  12/30/2019   RIGHT/LEFT HEART CATH AND CORONARY ANGIOGRAPHY N/A 01/02/2020   Procedure: RIGHT/LEFT HEART CATH AND CORONARY ANGIOGRAPHY;  Surgeon: Claudene Victory ORN, MD;  Location: [facility] INVASIVE CV LAB;  Service: Cardiovascular;  Laterality: N/A;   TEE WITHOUT CARDIOVERSION N/A 01/03/2020   Procedure: TRANSESOPHAGEAL ECHOCARDIOGRAM (TEE)   DEFINITY  ;  Surgeon: Maranda Leim DEL, MD;  Location: [facility] ENDOSCOPY;  Service: Cardiovascular;  Laterality: N/A;    Scheduled Meds:  melatonin  3 mg Oral QHS   metoprolol  tartrate  12.5 mg Oral BID   pantoprazole   40 mg Oral Daily   polyethylene glycol  17 g Oral Daily   sacubitril -valsartan   1 tablet Oral BID   senna-docusate  1 tablet Oral QHS   spironolactone  12.5 mg Oral Daily   Continuous Infusions:  PRN Meds: acetaminophen   Allergies:   No Known Allergies  Social History:   Social History   Socioeconomic  History   Marital status: Married    Spouse name: Not on file   Number of children: 0   Years of education: Not on file   Highest education level: Not on file  Occupational History   Occupation: self employed  Tobacco Use   Smoking status: Former    Current packs/day: 0.00    Types: Cigarettes    Quit date: 01/05/2006    Years since quitting: 18.5   Smokeless tobacco: Never  Vaping Use   Vaping status: Never Used  Substance and Sexual Activity   Alcohol use: Yes    Alcohol/week: 6.0 standard drinks of alcohol    Types: 2 Glasses of wine, 4 Cans of beer per week    Comment: 0-2 per day   Drug use: No   Sexual activity: Yes  Other Topics Concern   Not on file  Social History Narrative   Marital status:  Married x 3 years; not happily married.      Children:  None      Lives: alone.      Employment:  Medical Sales Representative Custodian work x 7 years; happy.      Tobacco: smoke cigars 2 per day.  Quit  cigarettes in 2001; smoked x 15 years.      Alcohol:  Weekends 4 beers per week; 2 drinks per week.      Drugs:  None      Exercising:  Three days per week; active job.        Seatbelt:  100%      Guns:  None      Sexual activity: condoms; Gonorrhea in high school.  One partner in past year.  Last STD screening 2013.   Social Drivers of Corporate Investment Banker Strain: Not on file  Food Insecurity: Food Insecurity Present (07/19/2024)   Hunger Vital Sign    Worried About Running Out of Food in the Last Year: Sometimes true    Ran Out of Food in the Last Year: Sometimes true  Transportation Needs: Unmet Transportation Needs (07/19/2024)   PRAPARE - Administrator, Civil Service (Medical): Yes    Lack of Transportation (Non-Medical): No  Physical Activity: Not on file  Stress: Not on file  Social Connections: Not on file  Intimate Partner Violence: Not At Risk (07/19/2024)   Humiliation, Afraid, Rape, and Kick questionnaire    Fear of Current or Ex-Partner: No     Emotionally Abused: No    Physically Abused: No    Sexually Abused: No    Family History:   Family History  Problem Relation Age of Onset   Glaucoma Mother    Heart disease Father        stents/CAD/AMI age 10s.   Glaucoma Father    Pancreatic cancer Sister    Ovarian cancer Cousin        maternal   Colon cancer Neg Hx    Colitis Neg Hx    Esophageal cancer Neg Hx    Rectal cancer Neg Hx    Stomach cancer Neg Hx      ROS:  Please see the history of present illness.  All other ROS reviewed and negative.     Physical Exam/Data: Vitals:   07/21/24 0409 07/21/24 0532 07/21/24 0805 07/21/24 1243  BP:   136/81 127/83  Pulse:   91 91  Resp:   17 17  Temp:   97.7 F (36.5 C) 97.8 F (36.6 C)  TempSrc: Oral  Oral Oral  SpO2: 96%  95% 96%  Weight: 71.2 kg 71.2 kg    Height:        Intake/Output Summary (Last 24 hours) at 07/21/2024 1314 Last data filed at 07/21/2024 0831 Gross per 24 hour  Intake 637 ml  Output 1950 ml  Net -1313 ml      07/21/2024    5:32 AM 07/21/2024    4:09 AM 07/19/2024    1:40 PM  Last 3 Weights  Weight (lbs) 157 lb 157 lb 167 lb  Weight (kg) 71.215 kg 71.215 kg 75.751 kg     Body mass index is 20.71 kg/m.  General:  Well nourished, well developed, in no acute distress HEENT: normal Neck: + JVD Vascular: No carotid bruits; Distal pulses 2+ bilaterally Cardiac:  normal S1, S2; S4, RRR; no murmur  Lungs:  clear to auscultation bilaterally, no wheezing, rhonchi or rales  Abd: soft, nontender, no hepatomegaly  Ext: no edema Musculoskeletal:  No deformities, BUE and BLE strength normal and equal Skin: warm and dry  Neuro:  CNs 2-12 intact, no focal abnormalities noted Psych:  Normal affect   EKG:  The EKG was personally reviewed and demonstrates: Sinus rhythm, probable  LVH.  Inferior Q waves.  Nonspecific T wave changes. Telemetry:  Telemetry was personally reviewed and demonstrates: Sinus rhythm, PAC/PVCs.  Relevant CV  Studies: Echocardiogram 07/20/2024  1. Left ventricular apical flow is very sluggish and at very high risk of  developing a large thrombus, if one has not formed already.   2. Left ventricular ejection fraction, by estimation, is <20%. The left  ventricle has severely decreased function. The left ventricle demonstrates  global hypokinesis. The left ventricular internal cavity size was  moderately dilated. Left ventricular  diastolic parameters are consistent with Grade III diastolic dysfunction  (restrictive). Elevated left atrial pressure. There is the  interventricular septum is flattened in systole and diastole, consistent  with right ventricular pressure and volume  overload.   3. Right ventricular systolic function is mildly reduced. The right  ventricular size is mildly enlarged. There is mildly elevated pulmonary  artery systolic pressure.   4. Left atrial size was severely dilated.   5. Right atrial size was mildly dilated.   6. The mitral valve is normal in structure. Mild mitral valve  regurgitation. No evidence of mitral stenosis.   7. The aortic valve is tricuspid. Aortic valve regurgitation is not  visualized. No aortic stenosis is present.   8. The inferior vena cava is dilated in size with >50% respiratory  variability, suggesting right atrial pressure of 8 mmHg.   Laboratory Data: High Sensitivity Troponin:   Recent Labs  Lab 07/19/24 1324 07/19/24 1630  TROPONINIHS 39* 39*     Chemistry Recent Labs  Lab 07/19/24 1324 07/20/24 0352 07/21/24 0221  NA 143 140 138  K 4.5 4.1 3.9  CL 110 109 105  CO2 24 21* 24  GLUCOSE 118* 99 118*  BUN 18 14 20   CREATININE 1.38* 1.40* 1.39*  CALCIUM 8.4* 8.2* 8.0*  MG  --  1.7 1.7  GFRNONAA 57* 56* 57*  ANIONGAP 9 10 9     Recent Labs  Lab 07/19/24 1324 07/21/24 0221  PROT 6.7 6.3*  ALBUMIN 2.4* 2.1*  AST 133* 58*  ALT 141* 107*  ALKPHOS 110 82  BILITOT 0.9 1.3*   Lipids No results for input(s): CHOL,  TRIG, HDL, LABVLDL, LDLCALC, CHOLHDL in the last 168 hours.  Hematology Recent Labs  Lab 07/19/24 1324  WBC 8.1  RBC 4.94  HGB 15.0  HCT 42.3  MCV 85.6  MCH 30.4  MCHC 35.5  RDW 17.1*  PLT 173   Thyroid No results for input(s): TSH, FREET4 in the last 168 hours.  BNP Recent Labs  Lab 07/19/24 1324  BNP >4,500.0*    DDimer  Recent Labs  Lab 07/19/24 1324  DDIMER 1.63*    Radiology/Studies:  ECHOCARDIOGRAM COMPLETE Result Date: 07/20/2024    ECHOCARDIOGRAM REPORT   Patient Name:   OTHER ATIENZA Date of Exam: 07/20/2024 Medical Rec #:  [id number]       Height:       73.0 in Accession #:    [id number]      Weight:       167.0 lb Date of Birth:  Oct 30, 1960        BSA:          1.993 m Patient Age:    63 years        BP:           142/87 mmHg Patient Gender: M               HR:  92 bpm. Exam Location:  Inpatient Procedure: 2D Echo, Cardiac Doppler, Color Doppler and Intracardiac            Opacification Agent (Both Spectral and Color Flow Doppler were            utilized during procedure). Indications:    Congestive Heart Failure 150.9  History:        Patient has prior history of Echocardiogram examinations, most                 recent 05/28/2020. Previous Myocardial Infarction, Prior CABG;                 COPD.  Sonographer:    Merlynn Argyle Referring Phys: [id number] TODD D MCDIARMID IMPRESSIONS  1. Left ventricular apical flow is very sluggish and at very high risk of developing a large thrombus, if one has not formed already.  2. Left ventricular ejection fraction, by estimation, is <20%. The left ventricle has severely decreased function. The left ventricle demonstrates global hypokinesis. The left ventricular internal cavity size was moderately dilated. Left ventricular diastolic parameters are consistent with Grade III diastolic dysfunction (restrictive). Elevated left atrial pressure. There is the interventricular septum is flattened in systole and diastole, consistent  with right ventricular pressure and volume overload.  3. Right ventricular systolic function is mildly reduced. The right ventricular size is mildly enlarged. There is mildly elevated pulmonary artery systolic pressure.  4. Left atrial size was severely dilated.  5. Right atrial size was mildly dilated.  6. The mitral valve is normal in structure. Mild mitral valve regurgitation. No evidence of mitral stenosis.  7. The aortic valve is tricuspid. Aortic valve regurgitation is not visualized. No aortic stenosis is present.  8. The inferior vena cava is dilated in size with >50% respiratory variability, suggesting right atrial pressure of 8 mmHg. Comparison(s): A prior study was performed on 12/30/2019. The ejection fraction was 35-40% with global hypokinesis and moderately reduced right ventricular function. Conclusion(s)/Recommendation(s): Critical findings reported to [facility] medicine teaching service. FINDINGS  Left Ventricle: Left ventricular ejection fraction, by estimation, is <20%. The left ventricle has severely decreased function. The left ventricle demonstrates global hypokinesis. Definity  contrast agent was given IV to delineate the left ventricular endocardial borders. The left ventricular internal cavity size was moderately dilated. There is no left ventricular hypertrophy. The interventricular septum is flattened in systole and diastole, consistent with right ventricular pressure and volume overload. Left ventricular diastolic parameters are consistent with Grade III diastolic dysfunction (restrictive). Elevated left atrial pressure. Right Ventricle: The right ventricular size is mildly enlarged. No increase in right ventricular wall thickness. Right ventricular systolic function is mildly reduced. There is mildly elevated pulmonary artery systolic pressure. The tricuspid regurgitant  velocity is 2.66 m/s, and with an assumed right atrial pressure of 8 mmHg, the estimated right ventricular systolic  pressure is 36.3 mmHg. Left Atrium: Left atrial size was severely dilated. Right Atrium: Right atrial size was mildly dilated. Pericardium: There is no evidence of pericardial effusion. Mitral Valve: The mitral valve is normal in structure. Mild mitral valve regurgitation. No evidence of mitral valve stenosis. Tricuspid Valve: The tricuspid valve is normal in structure. Tricuspid valve regurgitation is trivial. No evidence of tricuspid stenosis. Aortic Valve: The aortic valve is tricuspid. Aortic valve regurgitation is not visualized. No aortic stenosis is present. Pulmonic Valve: The pulmonic valve was normal in structure. Pulmonic valve regurgitation is trivial. No evidence of pulmonic stenosis. Aorta: The aortic root and ascending aorta  are structurally normal, with no evidence of dilitation. Venous: The inferior vena cava is dilated in size with greater than 50% respiratory variability, suggesting right atrial pressure of 8 mmHg. IAS/Shunts: There is right bowing of the interatrial septum, suggestive of elevated left atrial pressure. No atrial level shunt detected by color flow Doppler. Additional Comments: Left ventricular apical flow is very sluggish and at very high risk of developing a large thrombus, if one has not formed already.  LEFT VENTRICLE PLAX 2D LVIDd:         5.60 cm      Diastology LVIDs:         5.00 cm      LV e' medial:    5.59 cm/s LV PW:         1.00 cm      LV E/e' medial:  13.8 LV IVS:        0.90 cm      LV e' lateral:   6.92 cm/s LVOT diam:     2.40 cm      LV E/e' lateral: 11.1 LV SV:         35 LV SV Index:   18 LVOT Area:     4.52 cm LV IVRT:       137 msec  LV Volumes (MOD) LV vol d, MOD A2C: 215.0 ml LV vol d, MOD A4C: 147.0 ml LV vol s, MOD A2C: 166.0 ml LV vol s, MOD A4C: 104.0 ml LV SV MOD A2C:     49.0 ml LV SV MOD A4C:     147.0 ml LV SV MOD BP:      41.5 ml RIGHT VENTRICLE             IVC RV Basal diam:  3.90 cm     IVC diam: 2.00 cm RV Mid diam:    3.40 cm RV S prime:      10.60 cm/s TAPSE (M-mode): 1.4 cm LEFT ATRIUM             Index        RIGHT ATRIUM           Index LA diam:        4.30 cm 2.16 cm/m   RA Area:     16.80 cm LA Vol (A2C):   84.3 ml 42.30 ml/m  RA Volume:   43.90 ml  22.03 ml/m LA Vol (A4C):   80.1 ml 40.19 ml/m LA Biplane Vol: 87.9 ml 44.11 ml/m  AORTIC VALVE             PULMONIC VALVE LVOT Vmax:   55.40 cm/s  PR End Diast Vel: 10.30 msec LVOT Vmean:  34.200 cm/s LVOT VTI:    0.078 m  AORTA Ao Root diam: 3.40 cm Ao Asc diam:  3.40 cm MITRAL VALVE                  TRICUSPID VALVE MV Area (PHT): 4.68 cm       TR Peak grad:   28.3 mmHg MV Decel Time: 162 msec       TR Vmax:        266.00 cm/s MR Peak grad:    76.4 mmHg MR Mean grad:    51.0 mmHg    SHUNTS MR Vmax:         437.00 cm/s  Systemic VTI:  0.08 m MR Vmean:        334.0 cm/s   Systemic Diam: 2.40 cm  MR PISA:         1.01 cm MR PISA Eff ROA: 7 mm MR PISA Radius:  0.40 cm MV E velocity: 77.10 cm/s MV A velocity: 32.20 cm/s MV E/A ratio:  2.39 Emeline Calender Electronically signed by Emeline Calender Signature Date/Time: 07/20/2024/5:14:18 PM    Final    CT Angio Chest PE W and/or Wo Contrast Result Date: 07/19/2024 CLINICAL DATA:  Concern for point embolism.  Positive D-dimer. EXAM: CT ANGIOGRAPHY CHEST WITH CONTRAST TECHNIQUE: Multidetector CT imaging of the chest was performed using the standard protocol during bolus administration of intravenous contrast. Multiplanar CT image reconstructions and MIPs were obtained to evaluate the vascular anatomy. RADIATION DOSE REDUCTION: This exam was performed according to the departmental dose-optimization program which includes automated exposure control, adjustment of the mA and/or kV according to patient size and/or use of iterative reconstruction technique. CONTRAST:  75mL OMNIPAQUE  IOHEXOL  350 MG/ML SOLN COMPARISON:  Chest radiograph dated 07/19/2024 and CT dated 04/10/2020. FINDINGS: Cardiovascular: There is mild cardiomegaly. No pericardial effusion. The  thoracic aorta is grossly remarkable. Evaluation of the pulmonary arteries is limited due to respiratory motion. No pulmonary artery embolus identified. Mediastinum/Nodes: Bilateral hilar and mediastinal adenopathy measure up to approximately 2 cm in short axis in the subcarinal region. Prevascular space lymph node measures 15 mm short axis. The esophagus is grossly unremarkable. No mediastinal fluid collection. Lungs/Pleura: Small bilateral pleural effusions. There is background of centrilobular and paraseptal emphysema. There is diffuse interstitial and interlobular septal thickening consistent with edema. Areas of ground-glass airspace densities throughout the lungs likely represent edema. Pneumonia is not excluded. No pneumothorax. The central airways are patent. Upper Abdomen: No acute abnormality. Musculoskeletal: Degenerative changes of the spine. No acute osseous pathology. Review of the MIP images confirms the above findings. IMPRESSION: 1. No CT evidence of pulmonary artery embolus. 2. Cardiomegaly with findings of CHF and small bilateral pleural effusions. 3. Areas of ground-glass airspace densities throughout the lungs likely represent edema. Pneumonia is not excluded. 4. Mediastinal and hilar adenopathy, likely reactive. 5.  Emphysema (ICD10-J43.9). Electronically Signed   By: Vanetta Chou M.D.   On: 07/19/2024 17:59   DG Chest 2 View Result Date: 07/19/2024 EXAM: 2 VIEW(S) XRAY OF THE CHEST 07/19/2024 03:06:09 PM COMPARISON: 01/06/2020 CLINICAL HISTORY: sob FINDINGS: LUNGS AND PLEURA: Bilateral interstitial densities are noted most consistent with pulmonary edema. No pleural effusion. No pneumothorax. HEART AND MEDIASTINUM: Stable cardiomediastinal silhouette. BONES AND SOFT TISSUES: No acute osseous abnormality. IMPRESSION: 1. Bilateral interstitial densities, most consistent with pulmonary edema. Electronically signed by: Lynwood Seip MD 07/19/2024 03:28 PM EST RP Workstation: [id number]      Assessment and Plan:  Acute HFrEF Hx NICM -2021 reduced EF in the setting of bilateral PE, EF improved 40-45%, normal coronaries.  Patient presenting with worsening complaints of DOE for the past 1 week, previously NYHA class I symptoms.  Echocardiogram this admission demonstrates significant reduction in EF less than 20%, very sluggish flow in the apex although no LV thrombus documented.  Global hypokinesis with grade 3 diastolic dysfunction and interventricular septal flattening in systole/diastole.  RV mildly reduced with mildly elevated PASP.  Severely dilated LA.  Mild MR.  He feels essentially back at baseline, overall euvolemic with mild JVD.  Appears compensated and feels warm on exam currently.  Given history of normal coronaries in 2021, it is less likely he has had accelerated CAD in the past 4 years.  Will plan for stress cardiac MRI first.  Later on can  consider need for left heart catheterization.   May also benefit from right heart catheterization, clinically does not appear to be in shock and volume status seems to be okay but his echocardiogram and his other clinical measurements seem contrary to this.  RHC maybe beneficial to help clarify this. GDMT: Continue with Entresto  24-26 mg twice daily.  Okay to continue his beta-blocker for now, previously on Toprol  XL 25 mg.  Will transition this to Lopressor  12.5 mg twice daily to not commit him to long-acting therapy in case of tenuous clinical status.  Consolidate to Toprol -XL if able to at discharge.   Start spironolactone 12.5 mg.  Jardiance  may be difficult given financial issues. Will obtain TSH and continue further workup for secondary etiologies for NICM.  No known history of cardiomyopathies in family.  History of bilateral PE/DVT requiring thrombectomy Noted in 2021 intermittently compliant with his Eliquis .   Will transition to IV heparin  from Eliquis  in case he needs procedures during this admission.  Atrial  flutter Noted at the time of his PE, no documented recurrences.  Sinus here.  Continue IV heparin  per pharmacy  Elevated LFTs Only reported 2 beers a day.  Will obtain ultrasound Dopplers of the liver given prior thrombotic hx history.  Could possibly be hepatic congestion as they are downtrending versus alcohol use.  Continue to monitor.  Seems to have some chronic elevation.  Nicotine dependence Was smoking Black and milds, quit earlier this week.  Elevated troponin 39-39.  Not consistent with ACS.    SDOH Intermittently compliant with all of his medications above secondary to financial restraints.  Social work should be involved.   Risk Assessment/Risk Scores:   [place]  Heart Association (NYHA) Functional Class NYHA Class II  CHA2DS2-VASc Score = 2  This indicates a 2.2% annual risk of stroke. The patient's score is based upon: CHF History: 1 HTN History: 1 Diabetes History: 0 Stroke History: 0 Vascular Disease History: 0 Age Score: 0 Gender Score: 0   For questions or updates, please contact [facility] Please consult [email or web address] for contact info under      Signed, Thom LITTIE Sluder, PA-C  07/21/2024 1:14 PM

## 2024-07-21 NOTE — Assessment & Plan Note (Addendum)
 Creatinine remains around baseline. - CTM - AM BMP

## 2024-07-21 NOTE — Progress Notes (Signed)
 PHARMACY - ANTICOAGULATION CONSULT NOTE  Pharmacy Consult for IV heparin  Indication: atrial fibrillation  No Known Allergies  Patient Measurements: Height: 6' 1 (185.4 cm) Weight: 71.2 kg (157 lb) IBW/kg (Calculated) : 79.9 HEPARIN  DW (KG): 75.8  Vital Signs: Temp: 97.8 F (36.6 C) (11/15 1243) Temp Source: Oral (11/15 1243) BP: 127/83 (11/15 1243) Pulse Rate: 91 (11/15 1243)  Labs: Recent Labs    07/19/24 1324 07/19/24 1630 07/20/24 0352 07/21/24 0221  HGB 15.0  --   --   --   HCT 42.3  --   --   --   PLT 173  --   --   --   CREATININE 1.38*  --  1.40* 1.39*  TROPONINIHS 39* 39*  --   --     Estimated Creatinine Clearance: 54.1 mL/min (A) (by C-G formula based on SCr of 1.39 mg/dL (H)).   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    PFO (patent foramen ovale) 01/03/2020   Pulmonary embolism (HCC) 12/29/2019   Renal infarct     Medications:  Medications Prior to Admission  Medication Sig Dispense Refill Last Dose/Taking   acetaminophen  (TYLENOL ) 325 MG tablet Take 650 mg by mouth every 6 (six) hours as needed for moderate pain (pain  score 4-6).   Past Month   apixaban  (ELIQUIS ) 2.5 MG TABS tablet Take 1 tablet (2.5 mg total) by mouth 2 (two) times daily. 180 tablet 1 07/19/2024 at  8:30 AM   apixaban  (ELIQUIS ) 5 MG TABS tablet Take 5 mg by mouth 2 (two) times daily.   07/19/2024 at  8:30 AM   Cholecalciferol 50 MCG (2000 UT) TABS Take 50 mcg by mouth.   07/19/2024 at  8:30 AM   diclofenac Sodium (VOLTAREN) 1 % GEL Apply 4 g topically daily as needed (arthritic joint pain).   07/16/2024   furosemide  (LASIX ) 20 MG tablet Take 20 mg by mouth as needed for edema or fluid.   07/19/2024 Morning   methocarbamol (ROBAXIN) 750 MG tablet Take 750 mg by mouth 2 (two) times daily as needed for muscle spasms.   Past Month   metoprolol  succinate (TOPROL -XL) 50 MG 24 hr tablet Take 1 tablet (50 mg total) by mouth daily. 30 tablet 3 07/19/2024 at  8:30 AM   omeprazole (PRILOSEC) 20 MG capsule Take 20 mg by mouth daily.   07/19/2024 at  8:30 AM   sacubitril -valsartan  (ENTRESTO ) 97-103 MG Take 1 tablet by mouth 2 (two) times daily.   07/19/2024 at  8:30 AM   dicyclomine (BENTYL) 20 MG tablet Take 20 mg by mouth 3 (three) times daily before meals. (Patient not taking: Reported on 07/20/2024)   Not Taking   Scheduled:  melatonin  3 mg Oral QHS   metoprolol  tartrate  12.5 mg Oral BID   pantoprazole   40 mg Oral Daily   polyethylene glycol  17 g Oral Daily   sacubitril -valsartan   1 tablet Oral BID   senna-docusate  1 tablet Oral QHS   spironolactone  12.5 mg Oral Daily    Assessment: 70 yoM admitted on 11/13 with acute heart failure. Patient was on apixaban  5 mg BID for aflutter and history of PE and DVTs. Last dose of apixaban  was 11/15 at 0844. Pharmacy was consulted to transition the patient from apixaban  to heparin  due to potential need for procedure during this admission. Baseline Hgb 15 and plt 173. Will dose heparin  based on aPTT until heparin  level and aPTT correlate due to recent DOAC use.   Goal of Therapy:  Heparin  level 0.3-0.7  units/ml aPTT 66-102 seconds Monitor platelets by anticoagulation protocol: Yes   Plan:  Start heparin  infusion at 1150 units/hr in 12 hours from last apixaban  dose Recheck aPTT and anti-Xa level in 6 hours from start of heparin  infusion and daily while on heparin  Continue to monitor H&H and platelets  B. Maegan Kearia Yin, PharmD PGY-1 Pharmacy Resident [facility] 07/21/2024 1:17 PM

## 2024-07-22 ENCOUNTER — Other Ambulatory Visit (HOSPITAL_COMMUNITY): Payer: Self-pay

## 2024-07-22 DIAGNOSIS — Z8659 Personal history of other mental and behavioral disorders: Secondary | ICD-10-CM

## 2024-07-22 DIAGNOSIS — K769 Liver disease, unspecified: Secondary | ICD-10-CM | POA: Insufficient documentation

## 2024-07-22 LAB — COMPREHENSIVE METABOLIC PANEL WITH GFR
ALT: 83 U/L — ABNORMAL HIGH (ref 0–44)
AST: 36 U/L (ref 15–41)
Albumin: 2 g/dL — ABNORMAL LOW (ref 3.5–5.0)
Alkaline Phosphatase: 82 U/L (ref 38–126)
Anion gap: 10 (ref 5–15)
BUN: 22 mg/dL (ref 8–23)
CO2: 25 mmol/L (ref 22–32)
Calcium: 8.1 mg/dL — ABNORMAL LOW (ref 8.9–10.3)
Chloride: 102 mmol/L (ref 98–111)
Creatinine, Ser: 1.61 mg/dL — ABNORMAL HIGH (ref 0.61–1.24)
GFR, Estimated: 47 mL/min — ABNORMAL LOW (ref >60)
Glucose, Bld: 118 mg/dL — ABNORMAL HIGH (ref 70–99)
Potassium: 4.2 mmol/L (ref 3.5–5.1)
Sodium: 137 mmol/L (ref 135–145)
Total Bilirubin: 1.2 mg/dL (ref 0.0–1.2)
Total Protein: 6.2 g/dL — ABNORMAL LOW (ref 6.5–8.1)

## 2024-07-22 LAB — HEPARIN LEVEL (UNFRACTIONATED): Heparin Unfractionated: 0.77 [IU]/mL — ABNORMAL HIGH (ref 0.30–0.70)

## 2024-07-22 LAB — APTT
aPTT: 49 s — ABNORMAL HIGH (ref 24–36)
aPTT: 58 s — ABNORMAL HIGH (ref 24–36)
aPTT: 48 s — ABNORMAL HIGH (ref 24–36)
aPTT: 57 s — ABNORMAL HIGH (ref 24–36)

## 2024-07-22 LAB — MAGNESIUM: Magnesium: 2 mg/dL (ref 1.7–2.4)

## 2024-07-22 LAB — TSH: TSH: 2.304 u[IU]/mL (ref 0.350–4.500)

## 2024-07-22 MED ORDER — SPIRONOLACTONE 25 MG PO TABS
25.0000 mg | ORAL_TABLET | Freq: Every day | ORAL | Status: DC
  Administered 2024-07-22 – 2024-07-27 (×6): 25 mg via ORAL
  Filled 2024-07-22 (×6): qty 1

## 2024-07-22 NOTE — Assessment & Plan Note (Addendum)
 Out 2.075 L yesterday 11/15. Weight today of 153 lbs, down ~ 4 lbs from yesterday. - Appreciate Cardiology recommendations: - Stress cardiac MRI on 11/17 to evaluate for new ischemia in setting of reduced EF; consider RHC following - Metoprolol  tartrate 12.5 mg BID while inpatient; return to home regimen of metoprolol  succinate 25 mg daily at discharge as possible - INCREASE spironolactone to 25 mg daily - No further diuresis today 11/16 - Will need ICD outpatient - Cont Entresto  24-26 mg BID (continue this dose per cards) - Strict I/O, daily weight  - Labs: AM CMP, Mag - PT signed off 11/15, no further PT needs; OT signed off 11/16, no further OT needs - TOC consulted re: medication affordability, substance use - Pharmacy queried re: cost of SGLT2 inhibitors

## 2024-07-22 NOTE — Progress Notes (Signed)
 Rounding Note   Patient Name: Cody Joyce Date of Encounter: 07/22/2024  [facility] Cardiologist: Jerel Balding, MD   Subjective - No acute events overnight - Says that he becomes more SOB and hypoxic with exertion.  He was placed on 2 L nasal cannula due to overnight hypoxia. - Denies chest pain and SOB at rest  Scheduled Meds:  melatonin  3 mg Oral QHS   metoprolol  tartrate  12.5 mg Oral BID   pantoprazole   40 mg Oral Daily   polyethylene glycol  17 g Oral Daily   sacubitril -valsartan   1 tablet Oral BID   senna-docusate  1 tablet Oral QHS   spironolactone  25 mg Oral Daily   Continuous Infusions:  heparin  1,150 Units/hr (07/22/24 0708)   PRN Meds: acetaminophen    Vital Signs  Vitals:   07/22/24 0029 07/22/24 0032 07/22/24 0436 07/22/24 0548  BP:  117/79 124/89   Pulse:  89 92   Resp:   16   Temp: 98.8 F (37.1 C) 98.8 F (37.1 C) 98.9 F (37.2 C)   TempSrc:  Oral Oral   SpO2:  96% 92%   Weight:    69.5 kg  Height:        Intake/Output Summary (Last 24 hours) at 07/22/2024 0815 Last data filed at 07/22/2024 0708 Gross per 24 hour  Intake 330.71 ml  Output 1900 ml  Net -1569.29 ml      07/22/2024    5:48 AM 07/21/2024    5:32 AM 07/21/2024    4:09 AM  Last 3 Weights  Weight (lbs) 153 lb 3.5 oz 157 lb 157 lb  Weight (kg) 69.5 kg 71.215 kg 71.215 kg      Telemetry Mild sinus tachycardia, NSVT- Personally Reviewed  ECG  No new ECG  Physical Exam  GEN: No acute distress.   Neck: No JVD Cardiac: RRR, no murmurs, rubs, or gallops.  Respiratory: Faint bibasilar rales, no wheezes or rhonchi GI: Soft, nontender, non-distended  MS: No edema; No deformity. Neuro:  Nonfocal  Psych: Normal affect   Labs High Sensitivity Troponin:   Recent Labs  Lab 07/19/24 1324 07/19/24 1630  TROPONINIHS 39* 39*     Chemistry Recent Labs  Lab 07/19/24 1324 07/20/24 0352 07/21/24 0221 07/22/24 0237  NA 143 140 138 137  K 4.5 4.1 3.9  4.2  CL 110 109 105 102  CO2 24 21* 24 25  GLUCOSE 118* 99 118* 118*  BUN 18 14 20 22   CREATININE 1.38* 1.40* 1.39* 1.61*  CALCIUM 8.4* 8.2* 8.0* 8.1*  MG  --  1.7 1.7 2.0  PROT 6.7  --  6.3* 6.2*  ALBUMIN 2.4*  --  2.1* 2.0*  AST 133*  --  58* 36  ALT 141*  --  107* 83*  ALKPHOS 110  --  82 82  BILITOT 0.9  --  1.3* 1.2  GFRNONAA 57* 56* 57* 47*  ANIONGAP 9 10 9 10     Lipids No results for input(s): CHOL, TRIG, HDL, LABVLDL, LDLCALC, CHOLHDL in the last 168 hours.  Hematology Recent Labs  Lab 07/19/24 1324  WBC 8.1  RBC 4.94  HGB 15.0  HCT 42.3  MCV 85.6  MCH 30.4  MCHC 35.5  RDW 17.1*  PLT 173   Thyroid  Recent Labs  Lab 07/22/24 0237  TSH 2.304    BNP Recent Labs  Lab 07/19/24 1324  BNP >4,500.0*    DDimer  Recent Labs  Lab 07/19/24 1324  DDIMER 1.63*     Radiology  US  ABDOMEN LIMITED WITH LIVER DOPPLER Result Date: 07/21/2024 EXAM: RIGHT UPPER QUADRANT ULTRASOUND WITH DOPPLER EVALUATION 07/21/2024 90:71:75 PM COMPARISON: None available. CLINICAL HISTORY: LFT elevation. FINDINGS: LIVER: The liver demonstrates normal echogenicity without evidence of intrahepatic biliary ductal dilatation. No definite hepatic varices identified. BILIARY SYSTEM: Gallbladder is unremarkable without evidence of pericholecystic fluid, wall thickening or stones. Negative sonographic Murphy's sign. Common bile duct is within normal limits measuring 2 mm. RIGHT KIDNEY: The right kidney is grossly unremarkable without evidence of hydronephrosis. PANCREAS: Visualized portions of the pancreas are unremarkable. OTHER: No evidence of right upper quadrant ascites. The spleen is normal in size measuring 8.0 x 2.8 x 2.7 cm with a volume of 31 cc. Vascular/Doppler: Doppler interrogation of the abdominal vasculature was performed. Hepatic artery demonstrates peak systolic velocity of 106 cm per second and demonstrates an appropriate low-resistance arterial waveform. There is  appropriate antegrade (hepatopetal) flow within the main portal vein with appropriate peak systolic velocity of 267 cm/sec. Intrahepatic right and left portal veins are patent with appropriate antegrade flow with velocities ranging 31 - 36 cm per second. No portal venous thrombus or segmental occlusions identified. Hepatic veins are all patent with appropriate antegrade (hepatofugal) flow. Intrahepatic inferior vena cava is patent. Splenic vein is patent with appropriate antegrade flow. Normal Doppler waveforms are visualized. IMPRESSION: 1. No acute findings. 2. Hepatic vasculature demonstrates normal flow in the normal direction. Electronically signed by: Dorethia Molt MD 07/21/2024 10:45 PM EST RP Workstation: [id number]   ECHOCARDIOGRAM COMPLETE Result Date: 07/20/2024    ECHOCARDIOGRAM REPORT   Patient Name:   Cody Joyce Date of Exam: 07/20/2024 Medical Rec #:  [id number]       Height:       73.0 in Accession #:    [id number]      Weight:       167.0 lb Date of Birth:  [birth year]        BSA:          1.993 m Patient Age:    63 years        BP:           142/87 mmHg Patient Gender: M               HR:           92 bpm. Exam Location:  Inpatient Procedure: 2D Echo, Cardiac Doppler, Color Doppler and Intracardiac            Opacification Agent (Both Spectral and Color Flow Doppler were            utilized during procedure). Indications:    Congestive Heart Failure 150.9  History:        Patient has prior history of Echocardiogram examinations, most                 recent 05/28/2020. Previous Myocardial Infarction, Prior CABG;                 COPD.  Sonographer:    Merlynn Argyle Referring Phys: [id number] TODD D MCDIARMID IMPRESSIONS  1. Left ventricular apical flow is very sluggish and at very high risk of developing a large thrombus, if one has not formed already.  2. Left ventricular ejection fraction, by estimation, is <20%. The left ventricle has severely decreased function. The left ventricle demonstrates global  hypokinesis. The left ventricular internal cavity size was moderately dilated. Left ventricular diastolic parameters are consistent with Grade  III diastolic dysfunction (restrictive). Elevated left atrial pressure. There is the interventricular septum is flattened in systole and diastole, consistent with right ventricular pressure and volume overload.  3. Right ventricular systolic function is mildly reduced. The right ventricular size is mildly enlarged. There is mildly elevated pulmonary artery systolic pressure.  4. Left atrial size was severely dilated.  5. Right atrial size was mildly dilated.  6. The mitral valve is normal in structure. Mild mitral valve regurgitation. No evidence of mitral stenosis.  7. The aortic valve is tricuspid. Aortic valve regurgitation is not visualized. No aortic stenosis is present.  8. The inferior vena cava is dilated in size with >50% respiratory variability, suggesting right atrial pressure of 8 mmHg. Comparison(s): A prior study was performed on 12/30/2019. The ejection fraction was 35-40% with global hypokinesis and moderately reduced right ventricular function. Conclusion(s)/Recommendation(s): Critical findings reported to [facility] medicine teaching service. FINDINGS  Left Ventricle: Left ventricular ejection fraction, by estimation, is <20%. The left ventricle has severely decreased function. The left ventricle demonstrates global hypokinesis. Definity  contrast agent was given IV to delineate the left ventricular endocardial borders. The left ventricular internal cavity size was moderately dilated. There is no left ventricular hypertrophy. The interventricular septum is flattened in systole and diastole, consistent with right ventricular pressure and volume overload. Left ventricular diastolic parameters are consistent with Grade III diastolic dysfunction (restrictive). Elevated left atrial pressure. Right Ventricle: The right ventricular size is mildly enlarged. No increase  in right ventricular wall thickness. Right ventricular systolic function is mildly reduced. There is mildly elevated pulmonary artery systolic pressure. The tricuspid regurgitant  velocity is 2.66 m/s, and with an assumed right atrial pressure of 8 mmHg, the estimated right ventricular systolic pressure is 36.3 mmHg. Left Atrium: Left atrial size was severely dilated. Right Atrium: Right atrial size was mildly dilated. Pericardium: There is no evidence of pericardial effusion. Mitral Valve: The mitral valve is normal in structure. Mild mitral valve regurgitation. No evidence of mitral valve stenosis. Tricuspid Valve: The tricuspid valve is normal in structure. Tricuspid valve regurgitation is trivial. No evidence of tricuspid stenosis. Aortic Valve: The aortic valve is tricuspid. Aortic valve regurgitation is not visualized. No aortic stenosis is present. Pulmonic Valve: The pulmonic valve was normal in structure. Pulmonic valve regurgitation is trivial. No evidence of pulmonic stenosis. Aorta: The aortic root and ascending aorta are structurally normal, with no evidence of dilitation. Venous: The inferior vena cava is dilated in size with greater than 50% respiratory variability, suggesting right atrial pressure of 8 mmHg. IAS/Shunts: There is right bowing of the interatrial septum, suggestive of elevated left atrial pressure. No atrial level shunt detected by color flow Doppler. Additional Comments: Left ventricular apical flow is very sluggish and at very high risk of developing a large thrombus, if one has not formed already.  LEFT VENTRICLE PLAX 2D LVIDd:         5.60 cm      Diastology LVIDs:         5.00 cm      LV e' medial:    5.59 cm/s LV PW:         1.00 cm      LV E/e' medial:  13.8 LV IVS:        0.90 cm      LV e' lateral:   6.92 cm/s LVOT diam:     2.40 cm      LV E/e' lateral: 11.1 LV SV:  35 LV SV Index:   18 LVOT Area:     4.52 cm LV IVRT:       137 msec  LV Volumes (MOD) LV vol d, MOD A2C:  215.0 ml LV vol d, MOD A4C: 147.0 ml LV vol s, MOD A2C: 166.0 ml LV vol s, MOD A4C: 104.0 ml LV SV MOD A2C:     49.0 ml LV SV MOD A4C:     147.0 ml LV SV MOD BP:      41.5 ml RIGHT VENTRICLE             IVC RV Basal diam:  3.90 cm     IVC diam: 2.00 cm RV Mid diam:    3.40 cm RV S prime:     10.60 cm/s TAPSE (M-mode): 1.4 cm LEFT ATRIUM             Index        RIGHT ATRIUM           Index LA diam:        4.30 cm 2.16 cm/m   RA Area:     16.80 cm LA Vol (A2C):   84.3 ml 42.30 ml/m  RA Volume:   43.90 ml  22.03 ml/m LA Vol (A4C):   80.1 ml 40.19 ml/m LA Biplane Vol: 87.9 ml 44.11 ml/m  AORTIC VALVE             PULMONIC VALVE LVOT Vmax:   55.40 cm/s  PR End Diast Vel: 10.30 msec LVOT Vmean:  34.200 cm/s LVOT VTI:    0.078 m  AORTA Ao Root diam: 3.40 cm Ao Asc diam:  3.40 cm MITRAL VALVE                  TRICUSPID VALVE MV Area (PHT): 4.68 cm       TR Peak grad:   28.3 mmHg MV Decel Time: 162 msec       TR Vmax:        266.00 cm/s MR Peak grad:    76.4 mmHg MR Mean grad:    51.0 mmHg    SHUNTS MR Vmax:         437.00 cm/s  Systemic VTI:  0.08 m MR Vmean:        334.0 cm/s   Systemic Diam: 2.40 cm MR PISA:         1.01 cm MR PISA Eff ROA: 7 mm MR PISA Radius:  0.40 cm MV E velocity: 77.10 cm/s MV A velocity: 32.20 cm/s MV E/A ratio:  2.39 Emeline Calender Electronically signed by Emeline Calender Signature Date/Time: 07/20/2024/5:14:18 PM    Final     Cardiac Studies    Echocardiogram 07/20/2024  1. Left ventricular apical flow is very sluggish and at very high risk of  developing a large thrombus, if one has not formed already.   2. Left ventricular ejection fraction, by estimation, is <20%. The left  ventricle has severely decreased function. The left ventricle demonstrates  global hypokinesis. The left ventricular internal cavity size was  moderately dilated. Left ventricular  diastolic parameters are consistent with Grade III diastolic dysfunction  (restrictive). Elevated left atrial pressure. There is  the  interventricular septum is flattened in systole and diastole, consistent  with right ventricular pressure and volume  overload.   3. Right ventricular systolic function is mildly reduced. The right  ventricular size is mildly enlarged. There is mildly elevated pulmonary  artery systolic pressure.  4. Left atrial size was severely dilated.   5. Right atrial size was mildly dilated.   6. The mitral valve is normal in structure. Mild mitral valve  regurgitation. No evidence of mitral stenosis.   7. The aortic valve is tricuspid. Aortic valve regurgitation is not  visualized. No aortic stenosis is present.   8. The inferior vena cava is dilated in size with >50% respiratory  variability, suggesting right atrial pressure of 8 mmHg.   Patient Profile   Cody Joyce is a 63 y.o. 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   Assessment & Plan   #Acute on Chronic HFrEF - Patient has worsening systolic dysfunction, but surprisingly does not appear terribly volume overloaded. - I suspect that he is hiding his fluid well however, given that his LFTs are mildly elevated suggesting hepatic congestion. - The patient admits to spotty adherence to his GDMT, which I suspect is the reason why his EF has declined. - The better question though is why did he have an NICM in the first place.  LHC in 2021 showed clean coronaries. - Given the decline in his EF and the severity of his CHF, I think that is reasonable that he undergo cardiac MRI for evaluation.  Initially plan for stress testing; however, I think this is low yield given his clean cath a few years ago.  If his CMR demonstrates score an ischemic pattern then we will likely  pursue repeat LHC. -Lastly, it is really challenging to understand his volume status.  He looks euvolemic for the most part, but he desats with ambulation.  Will also pursue an RHC to better understand his hemodynamics and volume status. Cardiac MRI RHC N.p.o. at midnight Continue metoprolol  tartrate 12.5 mg twice daily while inpatient Continue spironolactone 12.5 mg daily Continue low-dose Entresto  Primary team figuring out the cost of SGLT2 inhibitor No additional Lasix  today pending RHC Strict I's and O's Daily weights Ultimately will need an ICD for primary prevention of SCD as an outpatient   #Atrial Flutter - Currently in NSR - Will hold DOAC for RHC Hold Eliquis  Continue heparin    #Hepatocellular Injury - Elevated LFTs on admission which may represent hepatic congestion in the setting of decompensated heart failure. - Liver ultrasound negative. -LFTs are improving after diuresis likely hepatic congestion.        For questions or updates, please contact [facility] Please consult [email or web address] for contact info under       Signed, Georganna Archer, MD  07/22/2024, 8:15 AM

## 2024-07-22 NOTE — Plan of Care (Signed)

## 2024-07-22 NOTE — Assessment & Plan Note (Addendum)
 HTN: Entresto  24-26 PO BID Atrial flutter  Hx PE: Holding home Eliquis  for potential RHC; on heparin  currently Alcohol dependence: CIWAs unremarkable so far during admission.  Last drink 11/13 per patient. Hx of smoking : Nicotine patch was offered but pt refused GERD: Protonix  40 mg daily Trouble sleeping: Melatonin daily at bedtime

## 2024-07-22 NOTE — Assessment & Plan Note (Signed)
 Cr elevated today to 1.61 from 1.39 yesterday. Baseline around 1.5 (was 1.38 at time of admission). - Continue to follow as above

## 2024-07-22 NOTE — Progress Notes (Signed)
 PHARMACY - ANTICOAGULATION CONSULT NOTE  Pharmacy Consult for IV heparin  Indication: atrial fibrillation  No Known Allergies  Patient Measurements: Height: 6' 1 (185.4 cm) Weight: 69.5 kg (153 lb 3.5 oz) IBW/kg (Calculated) : 79.9 HEPARIN  DW (KG): 75.8  Vital Signs: Temp: 98.1 F (36.7 C) (11/16 1636) Temp Source: Oral (11/16 1636) BP: 120/73 (11/16 1636) Pulse Rate: 89 (11/16 1636)  Labs: Recent Labs    07/20/24 0352 07/21/24 0221 07/21/24 1336 07/21/24 1336 07/22/24 0237 07/22/24 0838 07/22/24 1627  APTT  --   --  43*   < > 49* 58* 48*  HEPARINUNFRC  --   --  >1.10*  --  0.77*  --   --   CREATININE 1.40* 1.39*  --   --  1.61*  --   --    < > = values in this interval not displayed.    Estimated Creatinine Clearance: 45.6 mL/min (A) (by C-G formula based on SCr of 1.61 mg/dL (H)).   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    PFO (patent foramen ovale) 01/03/2020   Pulmonary embolism (HCC) 12/29/2019   Renal infarct     Medications:  Medications Prior to Admission  Medication Sig Dispense Refill Last Dose/Taking   acetaminophen   (TYLENOL ) 325 MG tablet Take 650 mg by mouth every 6 (six) hours as needed for moderate pain (pain score 4-6).   Past Month   apixaban  (ELIQUIS ) 2.5 MG TABS tablet Take 1 tablet (2.5 mg total) by mouth 2 (two) times daily. 180 tablet 1 07/19/2024 at  8:30 AM   apixaban  (ELIQUIS ) 5 MG TABS tablet Take 5 mg by mouth 2 (two) times daily.   07/19/2024 at  8:30 AM   Cholecalciferol 50 MCG (2000 UT) TABS Take 50 mcg by mouth.   07/19/2024 at  8:30 AM   diclofenac Sodium (VOLTAREN) 1 % GEL Apply 4 g topically daily as needed (arthritic joint pain).   07/16/2024   furosemide  (LASIX ) 20 MG tablet Take 20 mg by mouth as needed for edema or fluid.   07/19/2024 Morning   methocarbamol (ROBAXIN) 750 MG tablet Take 750 mg by mouth 2 (two) times daily as needed for muscle spasms.   Past Month   metoprolol  succinate (TOPROL -XL) 50 MG 24 hr tablet Take 1 tablet (50 mg total) by mouth daily. 30 tablet 3 07/19/2024 at  8:30 AM   omeprazole (PRILOSEC) 20 MG capsule Take 20 mg by mouth daily.   07/19/2024 at  8:30 AM   sacubitril -valsartan  (ENTRESTO ) 97-103 MG Take 1 tablet by mouth 2 (two) times daily.   07/19/2024 at  8:30 AM   dicyclomine (BENTYL) 20 MG tablet Take 20 mg by mouth 3 (three) times daily  before meals. (Patient not taking: Reported on 07/20/2024)   Not Taking   Scheduled:   melatonin  3 mg Oral QHS   metoprolol  tartrate  12.5 mg Oral BID   pantoprazole   40 mg Oral Daily   polyethylene glycol  17 g Oral Daily   sacubitril -valsartan   1 tablet Oral BID   senna-docusate  1 tablet Oral QHS   spironolactone  25 mg Oral Daily    Assessment: 77 yoM admitted on 11/13 with acute heart failure. Patient was on apixaban  5 mg BID for aflutter and history of PE and DVTs. Last dose of apixaban  was 11/15 at 0844. Pharmacy was consulted to transition the patient from apixaban  to heparin  due to potential need for procedure during this admission. Baseline Hgb 15 and plt 173. Baseline heparin  level was >1.10 and aPTT  was 43. Will dose heparin  based on aPTT until heparin  level and aPTT correlate due to recent DOAC use.   11/16 AM: APTT is subtherapeutic at 49 seconds (HL is 0.77). APTT was drawn early at 0237, which is approximately 4 hours after the start if the infusion (infusion start time was 11/15 at 2130). Reordered a repeat aPTT level, which came back subtherapeutic at 58 seconds. No signs of bleeding or issues with the infusion per RN.  11/16 PM - aPTT 48 sec drawn 4.5h after rate increase to 1300 units/hr.  ~8h aPTT 57 sec remains below goal.  No issues per RN.  RHC and CMR planned for tomorrow.   Goal of Therapy:  Heparin  level 0.3-0.7 units/ml aPTT 66-102 seconds Monitor platelets by anticoagulation protocol: Yes   Plan:  Increase heparin  infusion to 1450 units/hr  6h aPTT / heparin  level  Monitor H&H and platelets daily F/u CMR findings for possible LHC / ability to switch to PTA Eliquis   Maurilio Fila, PharmD Clinical Pharmacist 07/22/2024  5:19 PM

## 2024-07-22 NOTE — Assessment & Plan Note (Addendum)
 Elevated LFTs on admission; cards concerned for hepatic congestion in setting of heart failure. Liver US  without acute findings and with normal hepatic vasculature flow. Levels improving throughout admission. 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 Hep B Surface Ag negative, indicating either past exposure or current infection in window period.  - HCV RNA quant ordered for tomorrow - Hep B DNA PCR ordered for tomorrow - Continue to follow

## 2024-07-22 NOTE — Plan of Care (Signed)
   Problem: Clinical Measurements: Goal: Will remain free from infection Outcome: Completed/Met

## 2024-07-22 NOTE — Evaluation (Signed)
 Occupational Therapy Evaluation & Discharge Patient Details Name: Cody Joyce MRN: [id number] DOB: December 12, 1960 Today's Date: 07/22/2024   History of Present Illness   63 y.o. male admitted 11/13 for Acute decompensated heart failure (HCC)  CHF exacerbation. PMH:  HFrEF, CKD, PE     Clinical Impressions Pt admitted based on above, and was seen based on problem list below. PTA pt was independent with ADLs and IADLs. Today pt is mod I for ADLs with increased time. Pt completed standing perihygiene, grooming tasks, and functional transfers at mod I. Pt received in bathroom on 3L, decreased to 2L for activity. Ambulated 385ft, ind, pt destat to 84%. With seated rest break and cues for pursed lip breathing pt able to recover to 92%. Provided and reviewed use of Incentive spirometer and energy conservation handout with pt; pt verbalized understanding. All education complete, no further acute OT needs or follow up OT needs identified. OT is signing off on this pt.    If plan is discharge home, recommend the following:   Assist for transportation     Functional Status Assessment   Patient has not had a recent decline in their functional status     Equipment Recommendations   None recommended by OT      Precautions/Restrictions   Precautions Precautions: None Restrictions Weight Bearing Restrictions Per Provider Order: No     Mobility Bed Mobility     General bed mobility comments: Received in bathroom    Transfers Overall transfer level: Independent Equipment used: None     General transfer comment: Ambulated 379ft      Balance Overall balance assessment: Modified Independent       ADL either performed or assessed with clinical judgement   ADL Overall ADL's : Modified independent       General ADL Comments: Increased time, performed toilet transfer and standing grooming tasks mod I     Vision Baseline Vision/History: 1 Wears glasses Vision  Assessment?: No apparent visual deficits            Pertinent Vitals/Pain Pain Assessment Pain Assessment: No/denies pain     Extremity/Trunk Assessment Upper Extremity Assessment Upper Extremity Assessment: Overall WFL for tasks assessed   Lower Extremity Assessment Lower Extremity Assessment: Overall WFL for tasks assessed   Cervical / Trunk Assessment Cervical / Trunk Assessment: Normal   Communication Communication Communication: No apparent difficulties   Cognition Arousal: Alert Behavior During Therapy: WFL for tasks assessed/performed Cognition: No apparent impairments       Following commands: Intact       Cueing  General Comments   Cueing Techniques: Verbal cues  Received on 3L, decreased to 2L for session, destat to 84% after activity. Recovered with 3 minute seated rest break, and cues for pursed lip breathing to 92%           Home Living Family/patient expects to be discharged to:: Private residence Living Arrangements: Non-relatives/Friends Available Help at Discharge: Other (Comment) (Roommate) Type of Home: House Home Access: Level entry     Home Layout: Laundry or work area in basement     [employer] Shower/Tub: Chief Strategy Officer: Standard     Home Equipment: [employer] - single point          Prior Functioning/Environment Prior Level of Function : Independent/Modified Independent;Driving             Mobility Comments: Ind, occasional use of SPC, denies falls ADLs Comments: Ind, does odd jobs    OT  Problem List: Cardiopulmonary status limiting activity        OT Goals(Current goals can be found in the care plan section)   Acute Rehab OT Goals Patient Stated Goal: To get better OT Goal Formulation: All assessment and education complete, DC therapy Time For Goal Achievement: 08/05/24 Potential to Achieve Goals: Good   AM-PAC OT 6 Clicks Daily Activity     Outcome Measure Help from another person eating  meals?: None Help from another person taking care of personal grooming?: None Help from another person toileting, which includes using toliet, bedpan, or urinal?: None Help from another person bathing (including washing, rinsing, drying)?: None Help from another person to put on and taking off regular upper body clothing?: None Help from another person to put on and taking off regular lower body clothing?: None 6 Click Score: 24   End of Session Equipment Utilized During Treatment: Oxygen (2L) Nurse Communication: Mobility status  Activity Tolerance: Patient tolerated treatment well Patient left: in bed;with call bell/phone within reach  OT Visit Diagnosis: Other (comment) (Cardiopulmonary)                Time: 9195-9170 OT Time Calculation (min): 25 min Charges:  OT General Charges $OT Visit: 1 Visit OT Evaluation $OT Eval Moderate Complexity: 1 Mod  Adrianne BROCKS, OT  Acute Rehabilitation Services Office [phone] Secure chat preferred   Adrianne GORMAN [employer]vers 07/22/2024, 8:39 AM

## 2024-07-22 NOTE — H&P (View-Only) (Signed)
 Rounding Note   Patient Name: Cody Joyce Date of Encounter: 07/22/2024  [facility] Cardiologist: Jerel Balding, MD   Subjective - No acute events overnight - Says that he becomes more SOB and hypoxic with exertion.  He was placed on 2 L nasal cannula due to overnight hypoxia. - Denies chest pain and SOB at rest  Scheduled Meds:  melatonin  3 mg Oral QHS   metoprolol  tartrate  12.5 mg Oral BID   pantoprazole   40 mg Oral Daily   polyethylene glycol  17 g Oral Daily   sacubitril -valsartan   1 tablet Oral BID   senna-docusate  1 tablet Oral QHS   spironolactone  25 mg Oral Daily   Continuous Infusions:  heparin  1,150 Units/hr (07/22/24 0708)   PRN Meds: acetaminophen    Vital Signs  Vitals:   07/22/24 0029 07/22/24 0032 07/22/24 0436 07/22/24 0548  BP:  117/79 124/89   Pulse:  89 92   Resp:   16   Temp: 98.8 F (37.1 C) 98.8 F (37.1 C) 98.9 F (37.2 C)   TempSrc:  Oral Oral   SpO2:  96% 92%   Weight:    69.5 kg  Height:        Intake/Output Summary (Last 24 hours) at 07/22/2024 0815 Last data filed at 07/22/2024 0708 Gross per 24 hour  Intake 330.71 ml  Output 1900 ml  Net -1569.29 ml      07/22/2024    5:48 AM 07/21/2024    5:32 AM 07/21/2024    4:09 AM  Last 3 Weights  Weight (lbs) 153 lb 3.5 oz 157 lb 157 lb  Weight (kg) 69.5 kg 71.215 kg 71.215 kg      Telemetry Mild sinus tachycardia, NSVT- Personally Reviewed  ECG  No new ECG  Physical Exam  GEN: No acute distress.   Neck: No JVD Cardiac: RRR, no murmurs, rubs, or gallops.  Respiratory: Faint bibasilar rales, no wheezes or rhonchi GI: Soft, nontender, non-distended  MS: No edema; No deformity. Neuro:  Nonfocal  Psych: Normal affect   Labs High Sensitivity Troponin:   Recent Labs  Lab 07/19/24 1324 07/19/24 1630  TROPONINIHS 39* 39*     Chemistry Recent Labs  Lab 07/19/24 1324 07/20/24 0352 07/21/24 0221 07/22/24 0237  NA 143 140 138 137  K 4.5 4.1 3.9  4.2  CL 110 109 105 102  CO2 24 21* 24 25  GLUCOSE 118* 99 118* 118*  BUN 18 14 20 22   CREATININE 1.38* 1.40* 1.39* 1.61*  CALCIUM 8.4* 8.2* 8.0* 8.1*  MG  --  1.7 1.7 2.0  PROT 6.7  --  6.3* 6.2*  ALBUMIN 2.4*  --  2.1* 2.0*  AST 133*  --  58* 36  ALT 141*  --  107* 83*  ALKPHOS 110  --  82 82  BILITOT 0.9  --  1.3* 1.2  GFRNONAA 57* 56* 57* 47*  ANIONGAP 9 10 9 10     Lipids No results for input(s): CHOL, TRIG, HDL, LABVLDL, LDLCALC, CHOLHDL in the last 168 hours.  Hematology Recent Labs  Lab 07/19/24 1324  WBC 8.1  RBC 4.94  HGB 15.0  HCT 42.3  MCV 85.6  MCH 30.4  MCHC 35.5  RDW 17.1*  PLT 173   Thyroid  Recent Labs  Lab 07/22/24 0237  TSH 2.304    BNP Recent Labs  Lab 07/19/24 1324  BNP >4,500.0*    DDimer  Recent Labs  Lab 07/19/24 1324  DDIMER 1.63*     Radiology  US  ABDOMEN LIMITED WITH LIVER DOPPLER Result Date: 07/21/2024 EXAM: RIGHT UPPER QUADRANT ULTRASOUND WITH DOPPLER EVALUATION 07/21/2024 90:71:75 PM COMPARISON: None available. CLINICAL HISTORY: LFT elevation. FINDINGS: LIVER: The liver demonstrates normal echogenicity without evidence of intrahepatic biliary ductal dilatation. No definite hepatic varices identified. BILIARY SYSTEM: Gallbladder is unremarkable without evidence of pericholecystic fluid, wall thickening or stones. Negative sonographic Murphy's sign. Common bile duct is within normal limits measuring 2 mm. RIGHT KIDNEY: The right kidney is grossly unremarkable without evidence of hydronephrosis. PANCREAS: Visualized portions of the pancreas are unremarkable. OTHER: No evidence of right upper quadrant ascites. The spleen is normal in size measuring 8.0 x 2.8 x 2.7 cm with a volume of 31 cc. Vascular/Doppler: Doppler interrogation of the abdominal vasculature was performed. Hepatic artery demonstrates peak systolic velocity of 106 cm per second and demonstrates an appropriate low-resistance arterial waveform. There is  appropriate antegrade (hepatopetal) flow within the main portal vein with appropriate peak systolic velocity of 267 cm/sec. Intrahepatic right and left portal veins are patent with appropriate antegrade flow with velocities ranging 31 - 36 cm per second. No portal venous thrombus or segmental occlusions identified. Hepatic veins are all patent with appropriate antegrade (hepatofugal) flow. Intrahepatic inferior vena cava is patent. Splenic vein is patent with appropriate antegrade flow. Normal Doppler waveforms are visualized. IMPRESSION: 1. No acute findings. 2. Hepatic vasculature demonstrates normal flow in the normal direction. Electronically signed by: Dorethia Molt MD 07/21/2024 10:45 PM EST RP Workstation: [id number]   ECHOCARDIOGRAM COMPLETE Result Date: 07/20/2024    ECHOCARDIOGRAM REPORT   Patient Name:   Cody Joyce Date of Exam: 07/20/2024 Medical Rec #:  [id number]       Height:       73.0 in Accession #:    [id number]      Weight:       167.0 lb Date of Birth:  [birth year]        BSA:          1.993 m Patient Age:    63 years        BP:           142/87 mmHg Patient Gender: M               HR:           92 bpm. Exam Location:  Inpatient Procedure: 2D Echo, Cardiac Doppler, Color Doppler and Intracardiac            Opacification Agent (Both Spectral and Color Flow Doppler were            utilized during procedure). Indications:    Congestive Heart Failure 150.9  History:        Patient has prior history of Echocardiogram examinations, most                 recent 05/28/2020. Previous Myocardial Infarction, Prior CABG;                 COPD.  Sonographer:    Merlynn Argyle Referring Phys: [id number] TODD D MCDIARMID IMPRESSIONS  1. Left ventricular apical flow is very sluggish and at very high risk of developing a large thrombus, if one has not formed already.  2. Left ventricular ejection fraction, by estimation, is <20%. The left ventricle has severely decreased function. The left ventricle demonstrates global  hypokinesis. The left ventricular internal cavity size was moderately dilated. Left ventricular diastolic parameters are consistent with Grade  III diastolic dysfunction (restrictive). Elevated left atrial pressure. There is the interventricular septum is flattened in systole and diastole, consistent with right ventricular pressure and volume overload.  3. Right ventricular systolic function is mildly reduced. The right ventricular size is mildly enlarged. There is mildly elevated pulmonary artery systolic pressure.  4. Left atrial size was severely dilated.  5. Right atrial size was mildly dilated.  6. The mitral valve is normal in structure. Mild mitral valve regurgitation. No evidence of mitral stenosis.  7. The aortic valve is tricuspid. Aortic valve regurgitation is not visualized. No aortic stenosis is present.  8. The inferior vena cava is dilated in size with >50% respiratory variability, suggesting right atrial pressure of 8 mmHg. Comparison(s): A prior study was performed on 12/30/2019. The ejection fraction was 35-40% with global hypokinesis and moderately reduced right ventricular function. Conclusion(s)/Recommendation(s): Critical findings reported to [facility] medicine teaching service. FINDINGS  Left Ventricle: Left ventricular ejection fraction, by estimation, is <20%. The left ventricle has severely decreased function. The left ventricle demonstrates global hypokinesis. Definity  contrast agent was given IV to delineate the left ventricular endocardial borders. The left ventricular internal cavity size was moderately dilated. There is no left ventricular hypertrophy. The interventricular septum is flattened in systole and diastole, consistent with right ventricular pressure and volume overload. Left ventricular diastolic parameters are consistent with Grade III diastolic dysfunction (restrictive). Elevated left atrial pressure. Right Ventricle: The right ventricular size is mildly enlarged. No increase  in right ventricular wall thickness. Right ventricular systolic function is mildly reduced. There is mildly elevated pulmonary artery systolic pressure. The tricuspid regurgitant  velocity is 2.66 m/s, and with an assumed right atrial pressure of 8 mmHg, the estimated right ventricular systolic pressure is 36.3 mmHg. Left Atrium: Left atrial size was severely dilated. Right Atrium: Right atrial size was mildly dilated. Pericardium: There is no evidence of pericardial effusion. Mitral Valve: The mitral valve is normal in structure. Mild mitral valve regurgitation. No evidence of mitral valve stenosis. Tricuspid Valve: The tricuspid valve is normal in structure. Tricuspid valve regurgitation is trivial. No evidence of tricuspid stenosis. Aortic Valve: The aortic valve is tricuspid. Aortic valve regurgitation is not visualized. No aortic stenosis is present. Pulmonic Valve: The pulmonic valve was normal in structure. Pulmonic valve regurgitation is trivial. No evidence of pulmonic stenosis. Aorta: The aortic root and ascending aorta are structurally normal, with no evidence of dilitation. Venous: The inferior vena cava is dilated in size with greater than 50% respiratory variability, suggesting right atrial pressure of 8 mmHg. IAS/Shunts: There is right bowing of the interatrial septum, suggestive of elevated left atrial pressure. No atrial level shunt detected by color flow Doppler. Additional Comments: Left ventricular apical flow is very sluggish and at very high risk of developing a large thrombus, if one has not formed already.  LEFT VENTRICLE PLAX 2D LVIDd:         5.60 cm      Diastology LVIDs:         5.00 cm      LV e' medial:    5.59 cm/s LV PW:         1.00 cm      LV E/e' medial:  13.8 LV IVS:        0.90 cm      LV e' lateral:   6.92 cm/s LVOT diam:     2.40 cm      LV E/e' lateral: 11.1 LV SV:  35 LV SV Index:   18 LVOT Area:     4.52 cm LV IVRT:       137 msec  LV Volumes (MOD) LV vol d, MOD A2C:  215.0 ml LV vol d, MOD A4C: 147.0 ml LV vol s, MOD A2C: 166.0 ml LV vol s, MOD A4C: 104.0 ml LV SV MOD A2C:     49.0 ml LV SV MOD A4C:     147.0 ml LV SV MOD BP:      41.5 ml RIGHT VENTRICLE             IVC RV Basal diam:  3.90 cm     IVC diam: 2.00 cm RV Mid diam:    3.40 cm RV S prime:     10.60 cm/s TAPSE (M-mode): 1.4 cm LEFT ATRIUM             Index        RIGHT ATRIUM           Index LA diam:        4.30 cm 2.16 cm/m   RA Area:     16.80 cm LA Vol (A2C):   84.3 ml 42.30 ml/m  RA Volume:   43.90 ml  22.03 ml/m LA Vol (A4C):   80.1 ml 40.19 ml/m LA Biplane Vol: 87.9 ml 44.11 ml/m  AORTIC VALVE             PULMONIC VALVE LVOT Vmax:   55.40 cm/s  PR End Diast Vel: 10.30 msec LVOT Vmean:  34.200 cm/s LVOT VTI:    0.078 m  AORTA Ao Root diam: 3.40 cm Ao Asc diam:  3.40 cm MITRAL VALVE                  TRICUSPID VALVE MV Area (PHT): 4.68 cm       TR Peak grad:   28.3 mmHg MV Decel Time: 162 msec       TR Vmax:        266.00 cm/s MR Peak grad:    76.4 mmHg MR Mean grad:    51.0 mmHg    SHUNTS MR Vmax:         437.00 cm/s  Systemic VTI:  0.08 m MR Vmean:        334.0 cm/s   Systemic Diam: 2.40 cm MR PISA:         1.01 cm MR PISA Eff ROA: 7 mm MR PISA Radius:  0.40 cm MV E velocity: 77.10 cm/s MV A velocity: 32.20 cm/s MV E/A ratio:  2.39 Emeline Calender Electronically signed by Emeline Calender Signature Date/Time: 07/20/2024/5:14:18 PM    Final     Cardiac Studies    Echocardiogram 07/20/2024  1. Left ventricular apical flow is very sluggish and at very high risk of  developing a large thrombus, if one has not formed already.   2. Left ventricular ejection fraction, by estimation, is <20%. The left  ventricle has severely decreased function. The left ventricle demonstrates  global hypokinesis. The left ventricular internal cavity size was  moderately dilated. Left ventricular  diastolic parameters are consistent with Grade III diastolic dysfunction  (restrictive). Elevated left atrial pressure. There is  the  interventricular septum is flattened in systole and diastole, consistent  with right ventricular pressure and volume  overload.   3. Right ventricular systolic function is mildly reduced. The right  ventricular size is mildly enlarged. There is mildly elevated pulmonary  artery systolic pressure.  4. Left atrial size was severely dilated.   5. Right atrial size was mildly dilated.   6. The mitral valve is normal in structure. Mild mitral valve  regurgitation. No evidence of mitral stenosis.   7. The aortic valve is tricuspid. Aortic valve regurgitation is not  visualized. No aortic stenosis is present.   8. The inferior vena cava is dilated in size with >50% respiratory  variability, suggesting right atrial pressure of 8 mmHg.   Patient Profile   Cody Joyce is a 63 y.o. 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   Assessment & Plan   #Acute on Chronic HFrEF - Patient has worsening systolic dysfunction, but surprisingly does not appear terribly volume overloaded. - I suspect that he is hiding his fluid well however, given that his LFTs are mildly elevated suggesting hepatic congestion. - The patient admits to spotty adherence to his GDMT, which I suspect is the reason why his EF has declined. - The better question though is why did he have an NICM in the first place.  LHC in 2021 showed clean coronaries. - Given the decline in his EF and the severity of his CHF, I think that is reasonable that he undergo cardiac MRI for evaluation.  Initially plan for stress testing; however, I think this is low yield given his clean cath a few years ago.  If his CMR demonstrates score an ischemic pattern then we will likely  pursue repeat LHC. -Lastly, it is really challenging to understand his volume status.  He looks euvolemic for the most part, but he desats with ambulation.  Will also pursue an RHC to better understand his hemodynamics and volume status. Cardiac MRI RHC N.p.o. at midnight Continue metoprolol  tartrate 12.5 mg twice daily while inpatient Continue spironolactone 12.5 mg daily Continue low-dose Entresto  Primary team figuring out the cost of SGLT2 inhibitor No additional Lasix  today pending RHC Strict I's and O's Daily weights Ultimately will need an ICD for primary prevention of SCD as an outpatient   #Atrial Flutter - Currently in NSR - Will hold DOAC for RHC Hold Eliquis  Continue heparin    #Hepatocellular Injury - Elevated LFTs on admission which may represent hepatic congestion in the setting of decompensated heart failure. - Liver ultrasound negative. -LFTs are improving after diuresis likely hepatic congestion.        For questions or updates, please contact [facility] Please consult [email or web address] for contact info under       Signed, Georganna Archer, MD  07/22/2024, 8:15 AM

## 2024-07-22 NOTE — Progress Notes (Signed)
 PHARMACY - ANTICOAGULATION CONSULT NOTE  Pharmacy Consult for IV heparin  Indication: atrial fibrillation  No Known Allergies  Patient Measurements: Height: 6' 1 (185.4 cm) Weight: 69.5 kg (153 lb 3.5 oz) IBW/kg (Calculated) : 79.9 HEPARIN  DW (KG): 75.8  Vital Signs: Temp: 98.9 F (37.2 C) (11/16 0436) Temp Source: Oral (11/16 0436) BP: 124/89 (11/16 0436) Pulse Rate: 92 (11/16 0436)  Labs: Recent Labs    07/19/24 1324 07/19/24 1630 07/20/24 0352 07/21/24 0221 07/21/24 1336 07/22/24 0237  HGB 15.0  --   --   --   --   --   HCT 42.3  --   --   --   --   --   PLT 173  --   --   --   --   --   APTT  --   --   --   --  43* 49*  HEPARINUNFRC  --   --   --   --  >1.10* 0.77*  CREATININE 1.38*  --  1.40* 1.39*  --  1.61*  TROPONINIHS 39* 39*  --   --   --   --     Estimated Creatinine Clearance: 45.6 mL/min (A) (by C-G formula based on SCr of 1.61 mg/dL (H)).   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    PFO (patent foramen ovale) 01/03/2020   Pulmonary embolism (HCC) 12/29/2019   Renal infarct     Medications:  Medications Prior to  Admission  Medication Sig Dispense Refill Last Dose/Taking   acetaminophen  (TYLENOL ) 325 MG tablet Take 650 mg by mouth every 6 (six) hours as needed for moderate pain (pain score 4-6).   Past Month   apixaban  (ELIQUIS ) 2.5 MG TABS tablet Take 1 tablet (2.5 mg total) by mouth 2 (two) times daily. 180 tablet 1 07/19/2024 at  8:30 AM   apixaban  (ELIQUIS ) 5 MG TABS tablet Take 5 mg by mouth 2 (two) times daily.   07/19/2024 at  8:30 AM   Cholecalciferol 50 MCG (2000 UT) TABS Take 50 mcg by mouth.   07/19/2024 at  8:30 AM   diclofenac Sodium (VOLTAREN) 1 % GEL Apply 4 g topically daily as needed (arthritic joint pain).   07/16/2024   furosemide  (LASIX ) 20 MG tablet Take 20 mg by mouth as needed for edema or fluid.   07/19/2024 Morning   methocarbamol (ROBAXIN) 750 MG tablet Take 750 mg by mouth 2 (two) times daily as needed for muscle spasms.   Past Month   metoprolol  succinate (TOPROL -XL) 50 MG 24 hr tablet Take 1 tablet (50 mg total) by mouth daily. 30 tablet 3 07/19/2024 at  8:30 AM   omeprazole (PRILOSEC) 20 MG capsule Take 20  mg by mouth daily.   07/19/2024 at  8:30 AM   sacubitril -valsartan  (ENTRESTO ) 97-103 MG Take 1 tablet by mouth 2 (two) times daily.   07/19/2024 at  8:30 AM   dicyclomine (BENTYL) 20 MG tablet Take 20 mg by mouth 3 (three) times daily before meals. (Patient not taking: Reported on 07/20/2024)   Not Taking   Scheduled:   melatonin  3 mg Oral QHS   metoprolol  tartrate  12.5 mg Oral BID   pantoprazole   40 mg Oral Daily   polyethylene glycol  17 g Oral Daily   sacubitril -valsartan   1 tablet Oral BID   senna-docusate  1 tablet Oral QHS   spironolactone  12.5 mg Oral Daily    Assessment: 39 yoM admitted on 11/13 with acute heart failure. Patient was on apixaban  5 mg BID for aflutter and history of PE and DVTs. Last dose of apixaban  was 11/15 at 0844. Pharmacy was consulted to transition the patient from apixaban  to heparin  due to potential need for procedure during this  admission. Baseline Hgb 15 and plt 173. Baseline heparin  level was >1.10 and aPTT was 43. Will dose heparin  based on aPTT until heparin  level and aPTT correlate due to recent DOAC use.   11/16 AM: APTT is subtherapeutic at 49 seconds (HL is 0.77). APTT was drawn early at 0237, which is approximately 4 hours after the start if the infusion (infusion start time was 11/15 at 2130). Reordered a repeat aPTT level, which came back subtherapeutic at 58 seconds. No signs of bleeding or issues with the infusion per RN.  Goal of Therapy:  Heparin  level 0.3-0.7 units/ml aPTT 66-102 seconds Monitor platelets by anticoagulation protocol: Yes   Plan:  Increase heparin  infusion to 1300 units/hr  Recheck aPTT and anti-Xa level in 6 hours Monitor H&H and platelets daily  B. Amon Rocher, PharmD PGY-1 Pharmacy Resident [facility] 07/22/2024 7:02 AM

## 2024-07-22 NOTE — Discharge Instructions (Addendum)
 Dear Cody Joyce,  Thank you for letting us  participate in your care. You were hospitalized for CHF exacerbation and diagnosed with Acute on chronic systolic heart failure (HCC). You were treated with medications as follow.   POST-HOSPITAL & CARE INSTRUCTIONS Please take medications as prescribed Please f/u w/ outpatient cardiology Please [facility] week Go to your follow up appointments (listed below)  DOCTOR'S APPOINTMENT   Future Appointments  Date Time Provider Department Center  08/06/2024 12:00 PM [facility] PA/NP [facility] None    Follow-up Information     [facility] Follow up on 08/06/2024.   Specialty: Cardiology Why: Follow up in the Advanced Heart Failure Clinic 08/06/24 at 12pm.   Entrance C, free valet Contact information: [address] [place]  [phone]                Take care and be well!  Family Medicine Teaching Service Inpatient Team [place]  [facility]  [address] [place], [place] [address] [phone]

## 2024-07-22 NOTE — Assessment & Plan Note (Addendum)
 Passive SI in the morning on 11/16, resolved at time of visit later that morning. - Continue to follow mood symptoms

## 2024-07-22 NOTE — Progress Notes (Signed)
 Daily Progress Note Intern Pager: [phone]  Patient name: Cody Joyce Medical record number: [id number] Date of birth: [birth year] Age: 63 y.o. Gender: male  Primary Care Provider: [facility] Consultants: None Code Status: Full  Pt Overview and Major Events to Date:  11/13: Admitted to FMTS  Medical Decision Making:  Cody Joyce is a 63 y.o. male with PMH of HFrEF, CKD 3a, PE on Eliquis , COPD, Alcohol Use Disorder. Admitted for acute decompensated heart failure in setting of reduced med adherence 2/2 financial difficulties. Found to have significantly decreased EF on echo at admission, LVEF < 20%. Cards following. Planning for stress cardiac MRI with possible RHC following, see below for further cards recs. Assessment & Plan Acute on chronic systolic heart failure (HCC) Out 2.075 L yesterday 11/15. Weight today of 153 lbs, down ~ 4 lbs from yesterday. - Appreciate Cardiology recommendations: - Stress cardiac MRI on 11/17 to evaluate for new ischemia in setting of reduced EF; consider RHC following - Metoprolol  tartrate 12.5 mg BID while inpatient; return to home regimen of metoprolol  succinate 25 mg daily at discharge as possible - INCREASE spironolactone to 25 mg daily - No further diuresis today 11/16 - Will need ICD outpatient - Cont Entresto  24-26 mg BID (continue this dose per cards) - Strict I/O, daily weight  - Labs: AM CMP, Mag - PT signed off 11/15, no further PT needs; OT signed off 11/16, no further OT needs - TOC consulted re: medication affordability, substance use - Pharmacy queried re: cost of SGLT2 inhibitors Hepatocellular injury Elevated LFTs on admission; cards concerned for hepatic congestion in setting of heart failure. Liver US  without acute findings and with normal hepatic vasculature flow. Levels improving throughout admission. 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 Hep B Surface Ag negative, indicating either past  exposure or current infection in window period.  - HCV RNA quant ordered for tomorrow - Hep B DNA PCR ordered for tomorrow - Continue to follow Chronic kidney disease, stage 3a (HCC) Cr elevated today to 1.61 from 1.39 yesterday. Baseline around 1.5 (was 1.38 at time of admission). - Continue to follow as above History of depressive symptoms Passive SI in the morning on 11/16, resolved at time of visit later that morning. - Continue to follow mood symptoms Chronic health problem HTN: Entresto  24-26 PO BID Atrial flutter  Hx PE: Holding home Eliquis  for potential RHC; on heparin  currently Alcohol dependence: CIWAs unremarkable so far during admission.  Last drink 11/13 per patient. Hx of smoking : Nicotine patch was offered but pt refused GERD: Protonix  40 mg daily Trouble sleeping: Melatonin daily at bedtime   FEN/GI: Heart healthy PPx: Heparin  Dispo: Home pending continued cardiac workup and medication management.  Subjective:  Patient with passive SI earlier this morning (reporting wanting to stop it all).  However, patient reports he had a good therapy session with OT and was able to talk with her about his childhood, and currently denies SI/HI.  Otherwise reports he is feeling well.  Breathing well.  No other concerns.  Patient apparently started on oxygen overnight, on 3 L at start of visit.  Bumped down to 2 L during visit without onset of dyspnea.  Objective: Temp:  [97.7 F (36.5 C)-99.9 F (37.7 C)] 98.9 F (37.2 C) (11/16 0436) Pulse Rate:  [89-97] 92 (11/16 0436) Resp:  [16-20] 16 (11/16 0436) BP: (117-136)/(78-90) 124/89 (11/16 0436) SpO2:  [89 %-96 %] 92 % (11/16 0436)  Weight:  [69.5 kg] 69.5 kg (11/16 0548) Physical Exam: General: Patient sitting up on side of bed, in no acute distress. Cardiovascular: Regular rate and rhythm, no murmurs/rubs/gallops. Respiratory: Normal work of breathing on 2L O2 via [place]. Clear to auscultation bilaterally; no wheezes,  crackles. Abdomen: Bowel sounds present and normoactive bilaterally. Soft, nondistended, nontender. Extremities: Skin warm, dry. No bilateral lower extremity edema. Psych: Appropriate affect, mood okay.  No SI/HI.  Laboratory: Most recent CBC Lab Results  Component Value Date   WBC 8.1 07/19/2024   HGB 15.0 07/19/2024   HCT 42.3 07/19/2024   MCV 85.6 07/19/2024   PLT 173 07/19/2024   Most recent BMP    Latest Ref Rng & Units 07/22/2024    2:37 AM  BMP  Glucose 70 - 99 mg/dL 881   BUN 8 - 23 mg/dL 22   Creatinine 9.38 - 1.24 mg/dL 8.38   Sodium 864 - 854 mmol/L 137   Potassium 3.5 - 5.1 mmol/L 4.2   Chloride 98 - 111 mmol/L 102   CO2 22 - 32 mmol/L 25   Calcium 8.9 - 10.3 mg/dL 8.1   Hepatic Function Panel     Component Value Date/Time   PROT 6.2 (L) 07/22/2024 0237   ALBUMIN 2.0 (L) 07/22/2024 0237   AST 36 07/22/2024 0237   ALT 83 (H) 07/22/2024 0237   ALKPHOS 82 07/22/2024 0237   BILITOT 1.2 07/22/2024 0237   BILIDIR 0.4 (H) 01/01/2020 0254   IBILI 0.6 01/01/2020 0254  Hep B Surface Ag: nonreactive  HCV Ab: reactive  Hep A IgM: nonreactive  Hep B C IgM: Reactive   Mag: 2.0 TSH: 2.304  Imaging/Diagnostic Tests: Liver US  11/15:  1. No acute findings. 2. Hepatic vasculature demonstrates normal flow in the normal direction.  Larraine Palma, MD 07/22/2024, 7:22 AM  PGY-1, [facility] FPTS Intern pager: [phone], text pages welcome Secure chat group [facility] Teaching Service

## 2024-07-23 ENCOUNTER — Encounter (HOSPITAL_COMMUNITY)
Admission: EM | Disposition: A | Discharge status: Home / Self Care | Payer: Self-pay | Source: Home / Self Care | Attending: Family Medicine

## 2024-07-23 ENCOUNTER — Encounter (HOSPITAL_COMMUNITY): Payer: Self-pay | Admitting: Internal Medicine

## 2024-07-23 ENCOUNTER — Other Ambulatory Visit: Payer: Self-pay

## 2024-07-23 ENCOUNTER — Inpatient Hospital Stay (HOSPITAL_COMMUNITY)

## 2024-07-23 DIAGNOSIS — I509 Heart failure, unspecified: Secondary | ICD-10-CM

## 2024-07-23 DIAGNOSIS — F419 Anxiety disorder, unspecified: Secondary | ICD-10-CM | POA: Diagnosis present

## 2024-07-23 HISTORY — PX: RIGHT HEART CATH: CATH118263

## 2024-07-23 LAB — CBC
HCT: 39.2 % (ref 39.0–52.0)
Hemoglobin: 14 g/dL (ref 13.0–17.0)
MCH: 30.1 pg (ref 26.0–34.0)
MCHC: 35.7 g/dL (ref 30.0–36.0)
MCV: 84.3 fL (ref 80.0–100.0)
Platelets: 148 K/uL — ABNORMAL LOW (ref 150–400)
RBC: 4.65 MIL/uL (ref 4.22–5.81)
RDW: 15.7 % — ABNORMAL HIGH (ref 11.5–15.5)
WBC: 13.5 K/uL — ABNORMAL HIGH (ref 4.0–10.5)
nRBC: 0 % (ref 0.0–0.2)

## 2024-07-23 LAB — POCT I-STAT EG7
Acid-Base Excess: 1 mmol/L (ref 0.0–2.0)
Acid-Base Excess: 1 mmol/L (ref 0.0–2.0)
Bicarbonate: 25.7 mmol/L (ref 20.0–28.0)
Bicarbonate: 25.8 mmol/L (ref 20.0–28.0)
Calcium, Ion: 1.21 mmol/L (ref 1.15–1.40)
Calcium, Ion: 1.22 mmol/L (ref 1.15–1.40)
HCT: 42 % (ref 39.0–52.0)
HCT: 42 % (ref 39.0–52.0)
Hemoglobin: 14.3 g/dL (ref 13.0–17.0)
Hemoglobin: 14.3 g/dL (ref 13.0–17.0)
O2 Saturation: 51 %
O2 Saturation: 53 %
Potassium: 4.5 mmol/L (ref 3.5–5.1)
Potassium: 4.6 mmol/L (ref 3.5–5.1)
Sodium: 139 mmol/L (ref 135–145)
Sodium: 140 mmol/L (ref 135–145)
TCO2: 27 mmol/L (ref 22–32)
TCO2: 27 mmol/L (ref 22–32)
pCO2, Ven: 39.1 mmHg — ABNORMAL LOW (ref 44–60)
pCO2, Ven: 39.3 mmHg — ABNORMAL LOW (ref 44–60)
pH, Ven: 7.423 (ref 7.25–7.43)
pH, Ven: 7.427 (ref 7.25–7.43)
pO2, Ven: 27 mmHg — CL (ref 32–45)
pO2, Ven: 27 mmHg — CL (ref 32–45)

## 2024-07-23 LAB — COMPREHENSIVE METABOLIC PANEL WITH GFR
ALT: 61 U/L — ABNORMAL HIGH (ref 0–44)
AST: 23 U/L (ref 15–41)
Albumin: 1.9 g/dL — ABNORMAL LOW (ref 3.5–5.0)
Alkaline Phosphatase: 71 U/L (ref 38–126)
Anion gap: 7 (ref 5–15)
BUN: 22 mg/dL (ref 8–23)
CO2: 20 mmol/L — ABNORMAL LOW (ref 22–32)
Calcium: 8.1 mg/dL — ABNORMAL LOW (ref 8.9–10.3)
Chloride: 106 mmol/L (ref 98–111)
Creatinine, Ser: 1.28 mg/dL — ABNORMAL HIGH (ref 0.61–1.24)
GFR, Estimated: >60 mL/min (ref >60)
Glucose, Bld: 102 mg/dL — ABNORMAL HIGH (ref 70–99)
Potassium: 4.6 mmol/L (ref 3.5–5.1)
Sodium: 133 mmol/L — ABNORMAL LOW (ref 135–145)
Total Bilirubin: 1.2 mg/dL (ref 0.0–1.2)
Total Protein: 6.4 g/dL — ABNORMAL LOW (ref 6.5–8.1)

## 2024-07-23 LAB — MAGNESIUM: Magnesium: 1.8 mg/dL (ref 1.7–2.4)

## 2024-07-23 LAB — APTT: aPTT: 48 s — ABNORMAL HIGH (ref 24–36)

## 2024-07-23 LAB — HEPARIN LEVEL (UNFRACTIONATED)
Heparin Unfractionated: <0.1 [IU]/mL — ABNORMAL LOW (ref 0.30–0.70)
Heparin Unfractionated: <0.1 [IU]/mL — ABNORMAL LOW (ref 0.30–0.70)

## 2024-07-23 LAB — MRSA NEXT GEN BY PCR, NASAL: MRSA by PCR Next Gen: NOT DETECTED

## 2024-07-23 SURGERY — RIGHT HEART CATH
Anesthesia: LOCAL

## 2024-07-23 MED ORDER — HEPARIN (PORCINE) 25000 UT/250ML-% IV SOLN
1800.0000 [IU]/h | INTRAVENOUS | Status: DC
  Administered 2024-07-24: 1800 [IU]/h via INTRAVENOUS
  Filled 2024-07-23: qty 250

## 2024-07-23 MED ORDER — SODIUM CHLORIDE 0.9 % IV SOLN: 250.0000 mL | INTRAVENOUS | Status: DC | PRN

## 2024-07-23 MED ORDER — SODIUM CHLORIDE 0.9% FLUSH
3.0000 mL | Freq: Two times a day (BID) | INTRAVENOUS | Status: DC
  Administered 2024-07-23: 3 mL via INTRAVENOUS

## 2024-07-23 MED ORDER — SODIUM CHLORIDE 0.9% FLUSH: 3.0000 mL | INTRAVENOUS | Status: DC | PRN

## 2024-07-23 MED ORDER — ASPIRIN 81 MG PO CHEW
81.0000 mg | CHEWABLE_TABLET | ORAL | Status: AC
  Administered 2024-07-23: 81 mg via ORAL
  Filled 2024-07-23: qty 1

## 2024-07-23 MED ORDER — MILRINONE LACTATE IN DEXTROSE 20-5 MG/100ML-% IV SOLN
0.1250 ug/kg/min | INTRAVENOUS | Status: DC
  Administered 2024-07-23 – 2024-07-25 (×3): 0.25 ug/kg/min via INTRAVENOUS
  Administered 2024-07-25 (×2): 0.125 ug/kg/min via INTRAVENOUS
  Filled 2024-07-23 (×4): qty 100

## 2024-07-23 MED ORDER — FUROSEMIDE 10 MG/ML IJ SOLN
80.0000 mg | Freq: Once | INTRAMUSCULAR | Status: AC
  Administered 2024-07-23: 80 mg via INTRAVENOUS
  Filled 2024-07-23: qty 8

## 2024-07-23 MED ORDER — CHLORHEXIDINE GLUCONATE CLOTH 2 % EX PADS
6.0000 | MEDICATED_PAD | Freq: Every day | CUTANEOUS | Status: DC
  Administered 2024-07-23 – 2024-07-27 (×4): 6 via TOPICAL

## 2024-07-23 MED ORDER — SODIUM CHLORIDE 0.9% FLUSH
10.0000 mL | Freq: Two times a day (BID) | INTRAVENOUS | Status: DC
  Administered 2024-07-23 – 2024-07-27 (×8): 10 mL

## 2024-07-23 MED ORDER — APIXABAN 5 MG PO TABS
5.0000 mg | ORAL_TABLET | Freq: Two times a day (BID) | ORAL | Status: DC
  Administered 2024-07-23: 5 mg via ORAL
  Filled 2024-07-23: qty 1

## 2024-07-23 MED ORDER — SERTRALINE HCL 50 MG PO TABS
25.0000 mg | ORAL_TABLET | Freq: Every day | ORAL | Status: DC
  Administered 2024-07-23 – 2024-07-26 (×4): 25 mg via ORAL
  Filled 2024-07-23 (×4): qty 1

## 2024-07-23 MED ORDER — HEPARIN (PORCINE) IN NACL 1000-0.9 UT/500ML-% IV SOLN
INTRAVENOUS | Status: DC | PRN
  Administered 2024-07-23: 500 mL

## 2024-07-23 MED ORDER — LIDOCAINE HCL (PF) 1 % IJ SOLN
INTRAMUSCULAR | Status: DC | PRN
  Administered 2024-07-23: 2 mL

## 2024-07-23 MED ORDER — MAGNESIUM SULFATE IN D5W 1-5 GM/100ML-% IV SOLN
1.0000 g | Freq: Once | INTRAVENOUS | Status: AC
  Administered 2024-07-23: 1 g via INTRAVENOUS
  Filled 2024-07-23: qty 100

## 2024-07-23 MED ORDER — GADOBUTROL 1 MMOL/ML IV SOLN
10.0000 mL | Freq: Once | INTRAVENOUS | Status: AC | PRN
  Administered 2024-07-23: 10 mL via INTRAVENOUS

## 2024-07-23 MED ORDER — SODIUM CHLORIDE 0.9% FLUSH: 10.0000 mL | INTRAVENOUS | Status: DC | PRN

## 2024-07-23 SURGICAL SUPPLY — 6 items
CATH SWAN GANZ 7F STRAIGHT (CATHETERS) IMPLANT
GLIDESHEATH SLENDER 7FR .021G (SHEATH) IMPLANT
GUIDEWIRE .025 260CM (WIRE) IMPLANT
PACK CARDIAC CATHETERIZATION (CUSTOM PROCEDURE TRAY) ×2 IMPLANT
TRANSDUCER W/STOPCOCK (MISCELLANEOUS) IMPLANT
TUBING ART PRESS 72 MALE/FEM (TUBING) IMPLANT

## 2024-07-23 NOTE — Progress Notes (Signed)
 Mobility Specialist Progress Note:    07/23/24 1411  Mobility  Activity Ambulated independently  Level of Assistance Independent after set-up  Assistive Device None  Distance Ambulated (ft) 25 ft  Range of Motion/Exercises Active;Left leg;Right leg  Activity Response Tolerated well  Mobility Referral Yes  Mobility visit 1 Mobility  Mobility Specialist Start Time (ACUTE ONLY) 1411  Mobility Specialist Stop Time (ACUTE ONLY) 1418  Mobility Specialist Time Calculation (min) (ACUTE ONLY) 7 min   Received pt sitting EOB agreeable to session. No c/o any symptoms. Pt moving and ambulating well. Pt able to perform movements w/ no issues. Returned pt to EOB w/ all needs met.   Venetia Keel Mobility Specialist Please Neurosurgeon or Rehab Office at [phone]

## 2024-07-23 NOTE — Progress Notes (Signed)
 PHARMACY - ANTICOAGULATION CONSULT NOTE  Pharmacy Consult for IV heparin  Indication: atrial fibrillation  No Known Allergies  Patient Measurements: Height: 6' 1 (185.4 cm) Weight: 70.7 kg (155 lb 14.4 oz) IBW/kg (Calculated) : 79.9 HEPARIN  DW (KG): 75.8  Vital Signs: Temp: 98.6 F (37 C) (11/17 1400) Temp Source: Oral (11/17 1400) BP: 120/80 (11/17 1400) Pulse Rate: 82 (11/17 1142)  Labs: Recent Labs    07/21/24 0221 07/21/24 1336 07/22/24 0237 07/22/24 0838 07/22/24 1627 07/22/24 2019 07/23/24 0322 07/23/24 0804 07/23/24 0804 07/23/24 1119 07/23/24 1120 07/23/24 1225  HGB  --   --   --   --   --   --   --  14.0   < > 14.3 14.3  --   HCT  --   --   --   --   --   --   --  39.2  --  42.0 42.0  --   PLT  --   --   --   --   --   --   --  148*  --   --   --   --   APTT  --    < > 49*   < > 48* 57* 48*  --   --   --   --   --   HEPARINUNFRC  --    < > 0.77*  --   --   --  <0.10*  --   --   --   --  <0.10*  CREATININE 1.39*  --  1.61*  --   --   --  1.28*  --   --   --   --   --    < > = values in this interval not displayed.    Estimated Creatinine Clearance: 58.3 mL/min (A) (by C-G formula based on SCr of 1.28 mg/dL (H)).   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 We will we ultrasound at followup the    History of pulmonary embolism 11/23/2020   Mediastinal adenopathy 07/25/2018   Multiple lung nodules on CT 07/25/2018   Non-STEMI (non-ST elevated myocardial infarction) (HCC) 01/03/2020    Osteoarthritis of left knee 05/30/2013   Tricompartmental disease Free-floating mass mostly in the superior lateral patellar compartment.    PFO (patent foramen ovale) 01/03/2020   Pulmonary embolism (HCC) 12/29/2019   Renal infarct     Medications:  Medications Prior to Admission  Medication Sig Dispense Refill Last Dose/Taking   acetaminophen  (TYLENOL ) 325 MG tablet Take 650 mg by mouth every 6 (six) hours as needed for moderate pain (pain score 4-6).   Past Month   apixaban  (ELIQUIS ) 2.5 MG TABS tablet Take 1 tablet (2.5 mg total) by mouth 2 (two) times daily. 180 tablet 1 07/19/2024 at  8:30 AM   apixaban  (ELIQUIS ) 5 MG TABS tablet Take 5 mg by mouth 2 (two) times daily.   07/19/2024 at  8:30 AM   Cholecalciferol 50 MCG (2000 UT) TABS Take 50 mcg by mouth.   07/19/2024 at  8:30 AM   diclofenac Sodium (VOLTAREN) 1 % GEL Apply 4 g topically daily as needed (arthritic joint pain).   07/16/2024   furosemide  (LASIX ) 20 MG tablet Take 20 mg by mouth as needed for edema or fluid.   07/19/2024 Morning   methocarbamol (ROBAXIN) 750 MG tablet Take 750 mg by mouth 2 (two) times daily as needed for muscle spasms.   Past Month   metoprolol  succinate (TOPROL -XL) 50 MG 24 hr tablet Take 1 tablet (50 mg total) by mouth daily. 30 tablet 3 07/19/2024 at  8:30 AM   omeprazole (PRILOSEC) 20 MG capsule Take 20 mg by mouth daily.   07/19/2024 at  8:30 AM   sacubitril -valsartan  (ENTRESTO ) 97-103 MG Take 1 tablet by mouth 2 (two) times daily.   07/19/2024 at  8:30 AM   dicyclomine (BENTYL) 20 MG tablet Take 20 mg by mouth 3 (three) times daily before meals. (Patient not taking: Reported on 07/20/2024)   Not Taking   Scheduled:   melatonin  3 mg Oral QHS   pantoprazole   40 mg Oral Daily   polyethylene glycol  17 g Oral Daily   sacubitril -valsartan   1 tablet Oral BID   senna-docusate  1 tablet Oral QHS   sertraline  25 mg Oral QHS   spironolactone  25 mg Oral Daily    Assessment: 5 yoM admitted on 11/13  with acute heart failure. Patient was on apixaban  5 mg BID for aflutter and history of PE and DVTs. Last dose of apixaban  was 11/15 at 0844. Pharmacy was consulted to transition the patient from apixaban  to heparin  due to potential need for procedure during this admission. Baseline Hgb 15 and plt 173. Baseline heparin  level was >1.10 and aPTT was 43. Will dose heparin  based on aPTT until heparin  level and aPTT correlate due to recent DOAC use.   Heparin  level came back subtherapeutic (<0.1), aPTT also subtherapeutic - heparin  was stopped on 11/17@0900  prior to cardiac cath. Underwent cath finding low output HF w/ biventricular involvement and volume overload. Discussed with MD and okay to change to apixaban . No s/sx of bleeding.   PM Update:  Change in plans- plan to place apixaban  on hold again for possible procedures and restart IV Heparin . Apixaban  given this PM at 15:31 PM. MD aware. Prior to switch, heparin  level was sub-therapeutic on 1600 units/hr.   Goal of Therapy:  Heparin  level 0.3-0.7 units/mL Monitor platelets by anticoagulation protocol: Yes   Plan:  Stop Apixaban .  At 0330 AM on 11/18, restart IV heparin  at 1800 units/hr Monitor CBC, and for s/sx of bleeding  Thank you for allowing pharmacy to participate in this patient's care,  Harlene Boga, PharmD, BCPS, BCCCP Clinical Pharmacist 07/23/2024 4:27 PM  Please check AMION for all [facility] Pharmacy phone numbers After 10:00 PM, call Main Pharmacy [phone]

## 2024-07-23 NOTE — Assessment & Plan Note (Signed)
 HTN: Entresto  24-26 PO BID Atrial flutter  Hx PE: Holding home Eliquis  for potential RHC; on heparin  currently Alcohol dependence: CIWAs unremarkable so far during admission.  Last drink 11/13 per patient. Hx of smoking : Nicotine patch was offered but pt refused GERD: Protonix  40 mg daily Trouble sleeping: Melatonin daily at bedtime CKD 3a : sCr continue to improve 1.28 this AM ( 1.61 >>1.39 yesterday). Baseline around 1.5. Normal BUN and GFR Depression : Continue to follow mood symptoms

## 2024-07-23 NOTE — Progress Notes (Signed)
 PHARMACY - ANTICOAGULATION CONSULT NOTE  Pharmacy Consult for IV heparin  Indication: atrial fibrillation  No Known Allergies  Patient Measurements: Height: 6' 1 (185.4 cm) Weight: 70.7 kg (155 lb 14.4 oz) IBW/kg (Calculated) : 79.9 HEPARIN  DW (KG): 75.8  Vital Signs: Temp: 98.5 F (36.9 C) (11/17 1142) Temp Source: Oral (11/17 1142) BP: 122/86 (11/17 1142) Pulse Rate: 82 (11/17 1142)  Labs: Recent Labs    07/21/24 0221 07/21/24 1336 07/22/24 0237 07/22/24 0838 07/22/24 1627 07/22/24 2019 07/23/24 0322 07/23/24 0804 07/23/24 0804 07/23/24 1119 07/23/24 1120 07/23/24 1225  HGB  --   --   --   --   --   --   --  14.0   < > 14.3 14.3  --   HCT  --   --   --   --   --   --   --  39.2  --  42.0 42.0  --   PLT  --   --   --   --   --   --   --  148*  --   --   --   --   APTT  --    < > 49*   < > 48* 57* 48*  --   --   --   --   --   HEPARINUNFRC  --    < > 0.77*  --   --   --  <0.10*  --   --   --   --  <0.10*  CREATININE 1.39*  --  1.61*  --   --   --  1.28*  --   --   --   --   --    < > = values in this interval not displayed.    Estimated Creatinine Clearance: 58.3 mL/min (A) (by C-G formula based on SCr of 1.28 mg/dL (H)).   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 We will we ultrasound at followup the    History of pulmonary embolism 11/23/2020   Mediastinal adenopathy 07/25/2018   Multiple lung nodules on CT 07/25/2018   Non-STEMI (non-ST elevated myocardial infarction) (HCC) 01/03/2020    Osteoarthritis of left knee 05/30/2013   Tricompartmental disease Free-floating mass mostly in the superior lateral patellar compartment.    PFO (patent foramen ovale) 01/03/2020   Pulmonary embolism (HCC) 12/29/2019   Renal infarct     Medications:  Medications Prior to Admission  Medication Sig Dispense Refill Last Dose/Taking   acetaminophen  (TYLENOL ) 325 MG tablet Take 650 mg by mouth every 6 (six) hours as needed for moderate pain (pain score 4-6).   Past Month   apixaban  (ELIQUIS ) 2.5 MG TABS tablet Take 1 tablet (2.5 mg total) by mouth 2 (two) times daily. 180 tablet 1 07/19/2024 at  8:30 AM   apixaban  (ELIQUIS ) 5 MG TABS tablet Take 5 mg by mouth 2 (two) times daily.   07/19/2024 at  8:30 AM   Cholecalciferol 50 MCG (2000 UT) TABS Take 50 mcg by mouth.   07/19/2024 at  8:30 AM   diclofenac Sodium (VOLTAREN) 1 % GEL Apply 4 g topically daily as needed (arthritic joint pain).   07/16/2024   furosemide  (LASIX ) 20 MG tablet Take 20 mg by mouth as needed for edema or fluid.   07/19/2024 Morning   methocarbamol (ROBAXIN) 750 MG tablet Take 750 mg by mouth 2 (two) times daily as needed for muscle spasms.   Past Month   metoprolol  succinate (TOPROL -XL) 50 MG 24 hr tablet Take 1 tablet (50 mg total) by mouth daily. 30 tablet 3 07/19/2024 at  8:30 AM   omeprazole (PRILOSEC) 20 MG capsule Take 20 mg by mouth daily.   07/19/2024 at  8:30 AM   sacubitril -valsartan  (ENTRESTO ) 97-103 MG Take 1 tablet by mouth 2 (two) times daily.   07/19/2024 at  8:30 AM   dicyclomine (BENTYL) 20 MG tablet Take 20 mg by mouth 3 (three) times daily before meals. (Patient not taking: Reported on 07/20/2024)   Not Taking   Scheduled:   melatonin  3 mg Oral QHS   pantoprazole   40 mg Oral Daily   polyethylene glycol  17 g Oral Daily   sacubitril -valsartan   1 tablet Oral BID   senna-docusate  1 tablet Oral QHS   sertraline  25 mg Oral QHS   spironolactone  25 mg Oral Daily    Assessment: 26 yoM admitted on 11/13  with acute heart failure. Patient was on apixaban  5 mg BID for aflutter and history of PE and DVTs. Last dose of apixaban  was 11/15 at 0844. Pharmacy was consulted to transition the patient from apixaban  to heparin  due to potential need for procedure during this admission. Baseline Hgb 15 and plt 173. Baseline heparin  level was >1.10 and aPTT was 43. Will dose heparin  based on aPTT until heparin  level and aPTT correlate due to recent DOAC use.   Heparin  level came back subtherapeutic (<0.1) - heparin  was stopped on 11/17@0900  prior to cardiac cath. Underwent cath finding low output HF w/ biventricular involvement and volume overload. Discussed with MD and okay to change to apixaban . No s/sx of bleeding.   Goal of Therapy:  Heparin  level 0.3-0.7 units/mL Monitor platelets by anticoagulation protocol: Yes   Plan:  Stop heparin  infusion Restart apixaban  5 mg BID  Monitor CBC, and for s/sx of bleeding  Thank you for allowing pharmacy to participate in this patient's care,  Suzen Sour, PharmD, BCCCP Clinical Pharmacist  Phone: [phone] 07/23/2024 2:04 PM  Please check AMION for all [facility] Pharmacy phone numbers After 10:00 PM, call Main Pharmacy [phone]

## 2024-07-23 NOTE — Assessment & Plan Note (Signed)
 States feeling anxious like having a panic attack since admission. Unable to sleep at night - Starting Zoloft 25 mg QHS

## 2024-07-23 NOTE — TOC Initial Note (Signed)
 Transition of Care [facility]) - Initial/Assessment Note    Patient Details  Name: Cody Joyce MRN: [id number] Date of Birth: 02-08-1961  Transition of [facility]) CM/SW Contact:    Arlana JINNY Nicholaus ISRAEL Phone Number: [phone] 07/23/2024, 2:09 PM  Clinical Narrative:     HF CSW met with patient at bedside. Patient stated that he lives with roommates. Patient stated that he has no history of HH services. Patient stated that he uses a cane as needed. Patient stated that he has a PCP and currently active with [facility]. Patient stated that he has support for transportation at dc.   HF CSW/CM will continue to follow and monitor for dc readiness.                     Patient Goals and CMS Choice            Expected Discharge Plan and Services                                              Prior Living Arrangements/Services                       Activities of Daily Living   ADL Screening (condition at time of admission) Independently performs ADLs?: Yes (appropriate for developmental age) Is the patient deaf or have difficulty hearing?: No Does the patient have difficulty seeing, even when wearing glasses/contacts?: No Does the patient have difficulty concentrating, remembering, or making decisions?: No  Permission Sought/Granted                  Emotional Assessment              Admission diagnosis:  SOB (shortness of breath) [R06.02] Hypoxia [R09.02] Acute decompensated heart failure (HCC) [I50.9] Acute on chronic congestive heart failure, unspecified heart failure type [facility]) [I50.9] Patient Active Problem List   Diagnosis Date Noted   Anxiety 07/23/2024   Hepatocellular injury 07/22/2024   History of depressive symptoms 07/22/2024   Acute on chronic systolic heart failure (HCC) 07/19/2024   Chronic health problem 07/19/2024   Acute decompensated heart failure (HCC) 07/19/2024   Stage 3a chronic kidney disease (HCC) 11/23/2020    Chronic systolic heart failure (HCC) 11/23/2020   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   History of pulmonary embolism & DVT 11/23/2020   Typical atrial flutter (HCC)    PFO (patent foramen ovale) 01/03/2020   Non-STEMI (non-ST elevated myocardial infarction) (HCC) 01/03/2020   Renal infarct    COPD (chronic obstructive pulmonary disease) (HCC) 07/25/2018   Alcohol dependence (HCC) 07/25/2018   Mediastinal adenopathy 07/25/2018   Multiple lung nodules on CT 07/25/2018   Heterotopic ossification 05/30/2013   Osteoarthritis of left knee 05/30/2013   PCP:  Clinic, [facility] Pharmacy:   [facility] - [place], [place] - [address] [place] [address] Phone: [phone] Fax: [phone]  [id number] - [place], [place] - [address] [place] [address] Phone: [phone] Fax: [phone]     Social Drivers of Health (SDOH) Social History: SDOH Screenings   Food Insecurity: Food Insecurity Present (07/19/2024)  Housing: High Risk (07/19/2024)  Transportation Needs: Unmet Transportation Needs (07/19/2024)  Utilities: At Risk (07/19/2024)  Depression (PHQ2-9): Low Risk  (01/08/2019)  Tobacco Use: Medium Risk (07/19/2024)   SDOH Interventions:     Readmission Risk Interventions    07/20/2024    3:21 PM  Readmission Risk Prevention Plan  Medication Screening Complete  Transportation Screening Complete

## 2024-07-23 NOTE — Interval H&P Note (Signed)
 History and Physical Interval Note:  07/23/2024 11:29 AM  Cody Joyce  has presented today for surgery, with the diagnosis of HF.  The various methods of treatment have been discussed with the patient and family. After consideration of risks, benefits and other options for treatment, the patient has consented to  Procedure(s): RIGHT HEART CATH (N/A) as a surgical intervention.  The patient's history has been reviewed, patient examined, no change in status, stable for surgery.  I have reviewed the patient's chart and labs.  Questions were answered to the patient's satisfaction.     Prosper Paff

## 2024-07-23 NOTE — Progress Notes (Signed)
 Daily Progress Note Intern Pager: [phone]  Patient name: Cody Joyce Medical record number: [id number] Date of birth: [birth year] Age: 63 y.o. Gender: male  Primary Care Provider: [facility] Consultants: None Code Status: Full  Pt Overview and Major Events to Date:  11/13: Admitted to FMTS  Medical Decision Making:  Cody Joyce is a 63 y.o. male with PMH of HFrEF, CKD 3a, PE on Eliquis , COPD, Alcohol Use Disorder. Admitted for acute decompensated heart failure in setting of reduced med adherence 2/2 financial difficulties. Found to have significantly decreased EF on echo at admission, LVEF < 20%. Cards following. Planning for stress cardiac MRI with possible RHC following Assessment & Plan Acute on chronic systolic heart failure (HCC) Out 2.075 L yesterday 11/15. Weight today of 153 lbs, down ~ 4 lbs from yesterday. - Appreciate Cardiology recommendations: - Stress cardiac MRI on 11/17 to evaluate for new ischemia in setting of reduced EF; consider RHC following - Continue Metoprolol  tartrate 12.5 mg BID while inpatient; return to home regimen of metoprolol  succinate 25 mg daily at discharge as possible - Continue spironolactone to 25 mg daily - Will need ICD outpatient - Cont Entresto  24-26 mg BID (continue this dose per cards) - Strict I/O, daily weight  - Labs: AM CMP, Mag - PT signed off 11/15, no further PT needs; OT signed off 11/16, no further OT needs - TOC consulted re: medication affordability, substance use - Pharmacy queried re: cost of SGLT2 inhibitors Hepatocellular injury Elevated LFTs on admission; cards concerned for hepatic congestion in setting of heart failure. Liver US  without acute findings and with normal hepatic vasculature flow. Levels improving throughout admission. 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 Hep B Surface Ag negative, indicating either past exposure or current infection in window period.  - HCV RNA  quant pending result - Hep B DNA PCR pending result - Continue to follow Anxiety States feeling anxious like having a panic attack since admission. Unable to sleep at night - Starting Zoloft 25 mg QHS Chronic health problem HTN: Entresto  24-26 PO BID Atrial flutter  Hx PE: Holding home Eliquis  for potential RHC; on heparin  currently Alcohol dependence: CIWAs unremarkable so far during admission.  Last drink 11/13 per patient. Hx of smoking : Nicotine patch was offered but pt refused GERD: Protonix  40 mg daily Trouble sleeping: Melatonin daily at bedtime CKD 3a : sCr continue to improve 1.28 this AM ( 1.61 >>1.39 yesterday). Baseline around 1.5. Normal BUN and GFR Depression : Continue to follow mood symptoms  FEN/GI: Heart healthy PPx: Heparin  gtt Dispo: Home pending continued cardiac workup and medication management.  Subjective:  C/o anxiety and brief episodes of panic attack since admission. States unable to sleep at night.    Objective: Temp:  [97 F (36.1 C)-98.9 F (37.2 C)] 98.9 F (37.2 C) (11/17 0745) Pulse Rate:  [83-95] 83 (11/17 0745) Resp:  [16-20] 18 (11/17 0745) BP: (110-127)/(73-87) 127/86 (11/17 0745) SpO2:  [92 %-97 %] 97 % (11/17 0745) Weight:  [70.7 kg] 70.7 kg (11/17 0555) Physical Exam: General: Rest in bed, in no acute distress. Cardiovascular: Regular rate and rhythm Respiratory: No distress on RA Abdomen: Soft, nondistended, nontender. Last BM 07/22/24 Extremities: Skin warm, dry. No bilateral lower extremity edema. Psych: Anxious   Laboratory: Most recent CBC Lab Results  Component Value Date   WBC 13.5 (H) 07/23/2024   HGB 14.0 07/23/2024   HCT 39.2 07/23/2024   MCV 84.3  07/23/2024   PLT 148 (L) 07/23/2024   Most recent BMP    Latest Ref Rng & Units 07/23/2024    3:22 AM  BMP  Glucose 70 - 99 mg/dL 897   BUN 8 - 23 mg/dL 22   Creatinine 9.38 - 1.24 mg/dL 8.71   Sodium 864 - 854 mmol/L 133   Potassium 3.5 - 5.1 mmol/L 4.6    Chloride 98 - 111 mmol/L 106   CO2 22 - 32 mmol/L 20   Calcium 8.9 - 10.3 mg/dL 8.1   Hepatic Function Panel     Component Value Date/Time   PROT 6.4 (L) 07/23/2024 0322   ALBUMIN 1.9 (L) 07/23/2024 0322   AST 23 07/23/2024 0322   ALT 61 (H) 07/23/2024 0322   ALKPHOS 71 07/23/2024 0322   BILITOT 1.2 07/23/2024 0322   BILIDIR 0.4 (H) 01/01/2020 0254   IBILI 0.6 01/01/2020 0254  Hep B Surface Ag: nonreactive  HCV Ab: reactive  Hep A IgM: nonreactive  Hep B C IgM: Reactive   Mag: 2.0 TSH: 2.304  Imaging/Diagnostic Tests: Liver US  11/15:  1. No acute findings. 2. Hepatic vasculature demonstrates normal flow in the normal direction.  Suzen Houston NOVAK, DO 07/23/2024, 9:23 AM  PGY-1, [facility] FPTS Intern pager: [phone], text pages welcome Secure chat group [facility] Teaching Service

## 2024-07-23 NOTE — Progress Notes (Signed)
 Heart Failure Navigator Progress Note  Assessed for Heart & Vascular TOC clinic readiness.  Patient does not meet criteria due to a Advanced Heart Failure Team was consulted. .   Navigator will sign off at this time.   Stephane Haddock, BSN, Scientist, Clinical (histocompatibility And Immunogenetics) Only

## 2024-07-23 NOTE — Progress Notes (Signed)
 Peripherally Inserted Central Catheter Placement  The IV Nurse has discussed with the patient and/or persons authorized to consent for the patient, the purpose of this procedure and the potential benefits and risks involved with this procedure.  The benefits include less needle sticks, lab draws from the catheter, and the patient may be discharged home with the catheter. Risks include, but not limited to, infection, bleeding, blood clot (thrombus formation), and puncture of an artery; nerve damage and irregular heartbeat and possibility to perform a PICC exchange if needed/ordered by physician.  Alternatives to this procedure were also discussed.  Bard Power PICC patient education guide, fact sheet on infection prevention and patient information card has been provided to patient /or left at bedside.    PICC Placement Documentation  PICC Triple Lumen 07/23/24 Right Basilic 41 cm 1 cm (Active)  Indication for Insertion or Continuance of Line Vasoactive infusions 07/23/24 1900  Exposed Catheter (cm) 1 cm 07/23/24 1900  Site Assessment Clean, Dry, Intact 07/23/24 1900  Lumen #1 Status Flushed;Saline locked;Blood return noted 07/23/24 1900  Lumen #2 Status Flushed;Saline locked;Blood return noted 07/23/24 1900  Lumen #3 Status Flushed;Saline locked;Blood return noted 07/23/24 1900  Dressing Type Transparent;Securing device 07/23/24 1900  Dressing Status Antimicrobial disc/dressing in place 07/23/24 1900  Line Care Connections checked and tightened 07/23/24 1900  Line Adjustment (NICU/IV Team Only) No 07/23/24 1900  Dressing Intervention New dressing;Adhesive placed at insertion site (IV team only) 07/23/24 1900  Dressing Change Due 07/30/24 07/23/24 1900       Leita  Yeshua Stryker 07/23/2024, 7:07 PM

## 2024-07-23 NOTE — Plan of Care (Signed)
  Problem: Education: Goal: Knowledge of General Education information will improve Description: Including pain rating scale, medication(s)/side effects and non-pharmacologic comfort measures Outcome: Progressing   Problem: Health Behavior/Discharge Planning: Goal: Ability to manage health-related needs will improve Outcome: Progressing   Problem: Clinical Measurements: Goal: Ability to maintain clinical measurements within normal limits will improve Outcome: Progressing Goal: Diagnostic test results will improve Outcome: Progressing Goal: Respiratory complications will improve Outcome: Progressing Goal: Cardiovascular complication will be avoided Outcome: Progressing   Problem: Activity: Goal: Risk for activity intolerance will decrease Outcome: Progressing   Problem: Education: Goal: Understanding of CV disease, CV risk reduction, and recovery process will improve Outcome: Progressing Goal: Individualized Educational Video(s) Outcome: Progressing   Problem: Activity: Goal: Ability to return to baseline activity level will improve Outcome: Progressing   Problem: Cardiovascular: Goal: Ability to achieve and maintain adequate cardiovascular perfusion will improve Outcome: Progressing Goal: Vascular access site(s) Level 0-1 will be maintained Outcome: Progressing   Problem: Health Behavior/Discharge Planning: Goal: Ability to safely manage health-related needs after discharge will improve Outcome: Progressing   Problem: Education: Goal: Understanding of CV disease, CV risk reduction, and recovery process will improve Outcome: Progressing Goal: Individualized Educational Video(s) Outcome: Progressing   Problem: Activity: Goal: Ability to return to baseline activity level will improve Outcome: Progressing   Problem: Cardiovascular: Goal: Ability to achieve and maintain adequate cardiovascular perfusion will improve Outcome: Progressing Goal: Vascular access site(s)  Level 0-1 will be maintained Outcome: Progressing   Problem: Health Behavior/Discharge Planning: Goal: Ability to safely manage health-related needs after discharge will improve Outcome: Progressing

## 2024-07-23 NOTE — Consult Note (Addendum)
 Advanced Heart Failure Team Consult Note   Primary Physician: [facility] Cardiologist:  Jerel Balding, MD  Reason for Consultation: A/C HFrEF>BiV HF  HPI:   Cody Joyce is seen today for evaluation of A/C HFrEF>BiV HF at the request of Dr. Pietro.   Cody Joyce is a 63 y.o. AAM with HFrEF, NicM, HTN, CKD 3a, Hx submassive PE/DVT, A flutter, COPD alcohol use disorder and former smoker.   L/RHC 4/21: normal coronaries. Hemodynamics relatively stable just mild pulmonary hypertension.   Cody Joyce presented with SOB, PND and orthopnea that began last Tuesday. Reports intermittent compliance with home meds, had recently been taking very other day d/t cost. Previously followed by Dr. Balding but ended up transferring care to cardiology through the [facility]. He lives with 2 roommates. Does odd jobs wherever needed. Reports being a heavy smoker prior to submassive PE, then he stopped for a while but over the last year he started smoking again about 2 black and mild's a day. Has been drinking for several years now. Buys a bottle of tequila every Friday and finishes it by Sunday then during the week he drinks 1-2 beers/day. Intermittent marijuana use. Reports father with history of MI/CAD, no other significant family history.   Admission labs reviewed: K 4.5, SCr 1.38, AST/ALT 133/144, BNP >4.5K, HsTrop 39>39, viral panel (-), CXR with pulmonary edema. Has been diuresing with IV lasix  feels his symptoms have improved. CIWA has been stable, no s/s of withdrawal. Renal function improving with diuresis.   Today he went for RHC which showed RA 13, PA 52/29 (39), PCW 26, Fick CO/CI 3.5/1.8, TD CO/CI 3.5/1.8, PAPi 1.8, PaSat 51%. He was started on IV milrinone. cMRI today showed LVEF 19%, mod LV dilatation. Inferior wall is akinetic with LGE and possible mural thrombus. LGE in coronary distrubution.  Findings worrisome for CAD vs sarcoid vs myocarditis. RVEF 27%.   Resting comfortably  in bed. Feels like his breathing has gotten better since milrinone was started.   Home Medications Prior to Admission medications   Medication Sig Start Date End Date Taking? Authorizing Provider  acetaminophen  (TYLENOL ) 325 MG tablet Take 650 mg by mouth every 6 (six) hours as needed for moderate pain (pain score 4-6).   Yes [provider]  apixaban  (ELIQUIS ) 2.5 MG TABS tablet Take 1 tablet (2.5 mg total) by mouth 2 (two) times daily. 03/17/22  Yes Croitoru, Mihai, MD  apixaban  (ELIQUIS ) 5 MG TABS tablet Take 5 mg by mouth 2 (two) times daily.   Yes [provider]  Cholecalciferol 50 MCG (2000 UT) TABS Take 50 mcg by mouth. 10/21/23  Yes [provider]  diclofenac Sodium (VOLTAREN) 1 % GEL Apply 4 g topically daily as needed (arthritic joint pain).   Yes [provider]  furosemide  (LASIX ) 20 MG tablet Take 20 mg by mouth as needed for edema or fluid.   Yes [provider]  methocarbamol (ROBAXIN) 750 MG tablet Take 750 mg by mouth 2 (two) times daily as needed for muscle spasms. 11/28/23  Yes [provider]  metoprolol  succinate (TOPROL -XL) 50 MG 24 hr tablet Take 1 tablet (50 mg total) by mouth daily. 03/17/22  Yes Croitoru, Mihai, MD  omeprazole (PRILOSEC) 20 MG capsule Take 20 mg by mouth daily.   Yes [provider]  sacubitril -valsartan  (ENTRESTO ) 97-103 MG Take 1 tablet by mouth 2 (two) times daily.   Yes [provider]  dicyclomine (BENTYL) 20 MG tablet Take 20  mg by mouth 3 (three) times daily before meals. 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    PFO (patent foramen ovale) 01/03/2020   Pulmonary embolism (HCC) 12/29/2019   Renal infarct     Past Surgical History: Past Surgical History:  Procedure Laterality Date   BUBBLE STUDY  01/03/2020   Procedure: BUBBLE STUDY;  Surgeon: Maranda Leim DEL, MD;  Location: [facility] ENDOSCOPY;  Service: Cardiovascular;;   IR ANGIOGRAM PULMONARY BILATERAL SELECTIVE  12/30/2019   IR ANGIOGRAM SELECTIVE EACH ADDITIONAL VESSEL  12/30/2019   IR ANGIOGRAM SELECTIVE EACH ADDITIONAL VESSEL  12/30/2019   IR THROMBECT PRIM MECH ADD (INCLU) MOD SED  12/30/2019   IR THROMBECT PRIM MECH INIT (INCLU) MOD SED  12/30/2019   IR US  GUIDE VASC ACCESS RIGHT  12/30/2019   RIGHT/LEFT HEART CATH AND CORONARY ANGIOGRAPHY N/A 01/02/2020   Procedure: RIGHT/LEFT HEART CATH AND CORONARY ANGIOGRAPHY;  Surgeon: Claudene Victory ORN, MD;  Location: [facility] INVASIVE CV LAB;  Service: Cardiovascular;  Laterality: N/A;   TEE WITHOUT CARDIOVERSION N/A 01/03/2020   Procedure: TRANSESOPHAGEAL ECHOCARDIOGRAM (TEE)   DEFINITY  ;  Surgeon: Maranda Leim DEL, MD;  Location: [facility] ENDOSCOPY;  Service: Cardiovascular;  Laterality: N/A;    Family History: Family History  Problem Relation Age of Onset   Glaucoma Mother    Heart disease Father        stents/CAD/AMI age 52s.   Glaucoma Father    Pancreatic cancer Sister    Ovarian cancer Cousin        maternal   Colon cancer Neg Hx    Colitis Neg Hx     Esophageal cancer Neg Hx    Rectal cancer Neg Hx    Stomach cancer Neg Hx     Social History: Social History   Socioeconomic History   Marital status: Married    Spouse name: Not on file   Number of children: 0   Years of education: Not on file   Highest education level: Not on file  Occupational History   Occupation: self employed  Tobacco Use   Smoking status: Former    Current packs/day: 0.00    Types: Cigarettes    Quit date: 01/05/2006    Years since quitting: 18.5   Smokeless tobacco: Never  Vaping Use   Vaping status: Never Used  Substance and Sexual Activity   Alcohol use: Yes    Alcohol/week: 6.0 standard drinks of alcohol    Types: 2 Glasses of wine, 4 Cans of beer per week    Comment: 0-2 per day   Drug use: No   Sexual activity: Yes  Other  Topics Concern   Not on file  Social History Narrative   Marital status:  Married x 3 years; not happily married.      Children:  None      Lives: alone.      Employment:  Medical Sales Representative Custodian work x 7 years; happy.      Tobacco: smoke cigars 2 per day.  Quit cigarettes in 2001; smoked x 15 years.      Alcohol:  Weekends 4 beers per week; 2 drinks per week.      Drugs:  None      Exercising:  Three days per week; active job.        Seatbelt:  100%      Guns:  None      Sexual activity: condoms; Gonorrhea in high school.  One partner in past year.  Last STD screening 2013.   Social Drivers of Corporate Investment Banker Strain: Not on file  Food Insecurity: Food Insecurity Present (07/19/2024)   Hunger Vital Sign    Worried About Running Out of Food in the Last Year: Sometimes true    Ran Out of Food in the Last Year: Sometimes true  Transportation Needs: Unmet Transportation Needs (07/19/2024)   PRAPARE - Administrator, Civil Service (Medical): Yes    Lack of Transportation (Non-Medical): No  Physical Activity: Not on file  Stress: Not on file  Social Connections: Not on file    Allergies:   No Known Allergies  Objective:    Vital Signs:   Temp:  [97 F (36.1 C)-98.9 F (37.2 C)] 98.5 F (36.9 C) (11/17 1142) Pulse Rate:  [78-94] 82 (11/17 1142) Resp:  [12-21] 12 (11/17 1142) BP: (109-127)/(73-88) 122/86 (11/17 1142) SpO2:  [86 %-97 %] 96 % (11/17 1142) Weight:  [70.7 kg] 70.7 kg (11/17 0555) Last BM Date : 07/22/24  Weight change: Filed Weights   07/21/24 0532 07/22/24 0548 07/23/24 0555  Weight: 71.2 kg 69.5 kg 70.7 kg    Intake/Output:   Intake/Output Summary (Last 24 hours) at 07/23/2024 1245 Last data filed at 07/23/2024 0900 Gross per 24 hour  Intake 1013.92 ml  Output 1395 ml  Net -381.08 ml    Physical Exam  General:  well appearing, sitting on EOB.  No respiratory difficulty Neck: JVD ~9/10 cm.  Cor: Regular rate & rhythm. No murmurs. Lungs: clear Extremities: no edema  Neuro: alert & oriented x 3. Affect pleasant.  Telemetry   NSR 90s (Personally reviewed)    EKG    NSR 80s 07/19/24  Labs   Basic Metabolic Panel: Recent Labs  Lab 07/19/24 1324 07/20/24 0352 07/21/24 0221 07/22/24 0237 07/23/24 0322 07/23/24 1119 07/23/24 1120  NA 143 140 138 137 133* 140 139  K 4.5 4.1 3.9 4.2 4.6 4.5 4.6  CL 110 109 105 102 106  --   --   CO2 24 21* 24 25 20*  --   --   GLUCOSE 118* 99 118* 118* 102*  --   --   BUN 18 14 20 22 22   --   --   CREATININE 1.38* 1.40* 1.39* 1.61* 1.28*  --   --   CALCIUM 8.4* 8.2* 8.0* 8.1* 8.1*  --   --   MG  --  1.7 1.7 2.0 1.8  --   --     Liver Function Tests: Recent Labs  Lab 07/19/24 1324 07/21/24 0221 07/22/24 0237 07/23/24 0322  AST 133* 58* 36 23  ALT 141* 107* 83* 61*  ALKPHOS 110 82 82 71  BILITOT 0.9 1.3* 1.2 1.2  PROT 6.7 6.3* 6.2* 6.4*  ALBUMIN 2.4* 2.1* 2.0* 1.9*   No results for input(s): LIPASE, AMYLASE in the last 168 hours. No results for input(s): AMMONIA in the last 168 hours.  CBC: Recent Labs  Lab 07/19/24 1324 07/23/24 0804 07/23/24 1119 07/23/24 1120  WBC  8.1 13.5*  --   --   NEUTROABS 4.9  --   --   --   HGB 15.0 14.0 14.3 14.3  HCT 42.3 39.2 42.0 42.0  MCV 85.6 84.3  --   --   PLT 173 148*  --   --     Cardiac Enzymes: No results for input(s): CKTOTAL, CKMB, CKMBINDEX, TROPONINI in the last 168 hours.  BNP: BNP (last 3 results) Recent Labs    07/19/24 1324  BNP >4,500.0*    ProBNP (last 3 results) No results for input(s): PROBNP in the last 8760 hours.   CBG: No results for input(s): GLUCAP in the last 168 hours.  Coagulation Studies: No results for input(s): LABPROT, INR in the last 72 hours.   Imaging   US  EKG SITE RITE Result Date: 07/23/2024 If Site Rite image not attached, placement could not be confirmed due to current cardiac rhythm.  CARDIAC CATHETERIZATION Result Date: 07/23/2024 Findings: RA = 13 RV = 51/15 PA = 52/29 (39) PCW = 26 Fick cardiac output/index = 3.5/1.8 TD CO/CI = 3.5/1.8 PVR = 3.8 WU Ao sat = 91% PA sat = 51%, 52\3% PAPi = 1.8 Assessment: 1. Low output HF with biventricular involvement and volume overload 2. Moderate mixed pulmonary HTN Plan/Discussion: Start milrinone. Continue diuresis. Stop b-blocker. Await cMRI. May need VQ to reassess for residual CTEPH Toribio Fuel, MD 11:34 AM    Medications:     Current Medications:  melatonin  3 mg Oral QHS   pantoprazole   40 mg Oral Daily   polyethylene glycol  17 g Oral Daily   sacubitril -valsartan   1 tablet Oral BID   senna-docusate  1 tablet Oral QHS   sertraline  25 mg Oral QHS   spironolactone  25 mg Oral Daily    Infusions:  heparin  Stopped (07/23/24 0900)   milrinone 0.25 mcg/kg/min (07/23/24 1227)      Patient Profile   Cody Joyce is a 63 y.o. male with chronic HFrEF, NicM, HTN, CKD 3a, Hx submassive PE/DVT. A flutter, COPD alcohol use disorder and former smoker. AHF team to see with A/C HFrEF>BiV HF.   Assessment/Plan   A/C HFrEF>>BiV HF - Echo 4/21: EF 35-40%, GHK, mod reduced RV - [facility] 4/21:  normal coronaries. Hemodynamics relatively stable just mild pulmonary hypertension.  - Echo 07/20/24: EF <20%, LV apical slow very sluggish, LV with GHK, GIIIDD, RV mildly reduced, mildly elevated PASP, LA severely dilated, RA mildly dilated, mild MR.  - RHC today with RA 13, PA 52/29 (39), PCW 26, Fick CO/CI 3.5/1.8, TD CO/CI 3.5/1.8, PAPi 1.8, PaSat 51%.  - NYHA IV on admission. Suspect exacerbation 2/2 ETOH abuse vs medication noncompliance. Sarcoid? - Volume not significantly elevated on exam but cath numbers were high. Will give 80 IV lasix  today.  - Started on milrinone 0.25 post cath.  - Continue Entresto  24-26 mg BID - Continue spiro 25 mg daily - Follow diuresis. If unable to diurese may need PICC line placement.  - No BB with acute decompensation - cMRI today showed LVEF 19%, mod LV dilatation. Inferior  wall is akinetic with LGE and possible mural thrombus. LGE in coronary distrubution.  Findings worrisome for CAD vs sarcoid vs myocarditis. RVEF 27%.  - Suspect he will need cardiac PET - Not a candidate for heart tx at this time with ongoing tobacco and ETOH use. Motivated to quit. Need to consider advanced therapies (LVAD).  - Can consider repeating LHC though he reports no CP and LHC back in 21' with normal coronaries.  - Check A1c and anemia panel. TSH WNL. HIV nonreactive  Atrial flutter - Restart eliquis  5 mg BID (previously on hep gtt) - Currently in NSR. Monitor with addition of milrinone, may need amiodarone .   Possible mural thrombus - LV apical flow very sluggish on echo this admission - cMRI also concerning for possible LV mural thrombus - Continue Eliquis  5 mg BID  Mod mixed pulmonary HTN - RHC pressures elevated as above on RHC this admission - May need VQ scan to r/o CTEPH  HTN - BP stable.  - Continue Entresto .   CKD 3a - Baseline SCr ~1.3? - 1.28 today - Follow with diuresis  Hx PE/DVT - 4/21: required IR bilateral thrombectomy complicated by embolic L  renal infarct secondary to PFO  - Continue Eliquis  5 mg BID, currently on hep gtt for cath. Now complete.     Length of Stay: 4  Beckey LITTIE Coe, NP  07/23/2024, 12:45 PM   Advanced Heart Failure Team Pager [phone] (M-F; 7a - 5p)  Please contact [facility] for night-coverage after hours (4p -7a ) and weekends on [email or web address]   \Patient seen and examined with the above-signed Advanced Practice Provider and/or Housestaff. I personally reviewed laboratory data, imaging studies and relevant notes. I independently examined the patient and formulated the important aspects of the plan. I have edited the note to reflect any of my changes or salient points. I have personally discussed the plan with the patient and/or family.  63 y/o male as above with h/o PE, ETOH, tobacco use, NICM with normal cors on cath 2021   Now presents with biventricular HF and low output   cMRI EF 19% concerning for underlying CAD  General:  Sitting up in bed. No resp difficulty HEENT: normal Neck: supple.  Jvp to jaw  Cor: Regular rate & rhythm. No rubs, gallops or murmurs. Lungs: clear Abdomen: soft, nontender, nondistended.Good bowel sounds. Extremities: no cyanosis, clubbing, rash, edema Neuro: alert & orientedx3, cranial nerves grossly intact. moves all 4 extremities w/o difficulty. Affect pleasant  Will start milrinone. Continue diuresis. Titrate GDMT. Will switch Eliquis  back to heparin  as will nee repeat coronary angio given MRI findings.   Check VQ prior to d/c to exclude high burden of residual PE  Toribio Fuel, MD  4:18 PM

## 2024-07-23 NOTE — Assessment & Plan Note (Signed)
 Out 2.075 L yesterday 11/15. Weight today of 153 lbs, down ~ 4 lbs from yesterday. - Appreciate Cardiology recommendations: - Stress cardiac MRI on 11/17 to evaluate for new ischemia in setting of reduced EF; consider RHC following - Continue Metoprolol  tartrate 12.5 mg BID while inpatient; return to home regimen of metoprolol  succinate 25 mg daily at discharge as possible - Continue spironolactone to 25 mg daily - Will need ICD outpatient - Cont Entresto  24-26 mg BID (continue this dose per cards) - Strict I/O, daily weight  - Labs: AM CMP, Mag - PT signed off 11/15, no further PT needs; OT signed off 11/16, no further OT needs - TOC consulted re: medication affordability, substance use - Pharmacy queried re: cost of SGLT2 inhibitors

## 2024-07-23 NOTE — Progress Notes (Signed)
 PHARMACY - ANTICOAGULATION CONSULT NOTE  Pharmacy Consult for IV heparin  Indication: atrial fibrillation  No Known Allergies  Patient Measurements: Height: 6' 1 (185.4 cm) Weight: 69.5 kg (153 lb 3.5 oz) IBW/kg (Calculated) : 79.9 HEPARIN  DW (KG): 75.8  Vital Signs: Temp: 97 F (36.1 C) (11/17 0059) Temp Source: Temporal (11/17 0059) BP: 115/83 (11/17 0059) Pulse Rate: 88 (11/17 0059)  Labs: Recent Labs    07/21/24 0221 07/21/24 1336 07/21/24 1336 07/22/24 0237 07/22/24 0838 07/22/24 1627 07/22/24 2019 07/23/24 0322  APTT  --  43*   < > 49*   < > 48* 57* 48*  HEPARINUNFRC  --  >1.10*  --  0.77*  --   --   --  <0.10*  CREATININE 1.39*  --   --  1.61*  --   --   --   --    < > = values in this interval not displayed.    Estimated Creatinine Clearance: 45.6 mL/min (A) (by C-G formula based on SCr of 1.61 mg/dL (H)).   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    PFO (patent foramen ovale) 01/03/2020   Pulmonary embolism (HCC) 12/29/2019   Renal infarct     Medications:  Medications Prior to Admission  Medication Sig Dispense Refill Last  Dose/Taking   acetaminophen  (TYLENOL ) 325 MG tablet Take 650 mg by mouth every 6 (six) hours as needed for moderate pain (pain score 4-6).   Past Month   apixaban  (ELIQUIS ) 2.5 MG TABS tablet Take 1 tablet (2.5 mg total) by mouth 2 (two) times daily. 180 tablet 1 07/19/2024 at  8:30 AM   apixaban  (ELIQUIS ) 5 MG TABS tablet Take 5 mg by mouth 2 (two) times daily.   07/19/2024 at  8:30 AM   Cholecalciferol 50 MCG (2000 UT) TABS Take 50 mcg by mouth.   07/19/2024 at  8:30 AM   diclofenac Sodium (VOLTAREN) 1 % GEL Apply 4 g topically daily as needed (arthritic joint pain).   07/16/2024   furosemide  (LASIX ) 20 MG tablet Take 20 mg by mouth as needed for edema or fluid.   07/19/2024 Morning   methocarbamol (ROBAXIN) 750 MG tablet Take 750 mg by mouth 2 (two) times daily as needed for muscle spasms.   Past Month   metoprolol  succinate (TOPROL -XL) 50 MG 24 hr tablet Take 1 tablet (50 mg total) by mouth daily. 30 tablet 3 07/19/2024 at  8:30 AM   omeprazole (PRILOSEC) 20 MG capsule Take 20 mg by mouth daily.   07/19/2024 at  8:30 AM   sacubitril -valsartan  (ENTRESTO ) 97-103 MG Take 1 tablet by mouth 2 (two) times daily.   07/19/2024 at  8:30 AM   dicyclomine (BENTYL) 20 MG  tablet Take 20 mg by mouth 3 (three) times daily before meals. (Patient not taking: Reported on 07/20/2024)   Not Taking   Scheduled:   melatonin  3 mg Oral QHS   metoprolol  tartrate  12.5 mg Oral BID   pantoprazole   40 mg Oral Daily   polyethylene glycol  17 g Oral Daily   sacubitril -valsartan   1 tablet Oral BID   senna-docusate  1 tablet Oral QHS   spironolactone  25 mg Oral Daily    Assessment: 22 yoM admitted on 11/13 with acute heart failure. Patient was on apixaban  5 mg BID for aflutter and history of PE and DVTs. Last dose of apixaban  was 11/15 at 0844. Pharmacy was consulted to transition the patient from apixaban  to heparin  due to potential need for procedure during this admission. Baseline Hgb 15 and plt 173. Baseline  heparin  level was >1.10 and aPTT was 43. Will dose heparin  based on aPTT until heparin  level and aPTT correlate due to recent DOAC use.   11/16 AM: APTT is subtherapeutic at 49 seconds (HL is 0.77). APTT was drawn early at 0237, which is approximately 4 hours after the start if the infusion (infusion start time was 11/15 at 2130). Reordered a repeat aPTT level, which came back subtherapeutic at 58 seconds. No signs of bleeding or issues with the infusion per RN.  11/16 PM - aPTT 48 sec drawn 4.5h after rate increase to 1300 units/hr.  ~8h aPTT 57 sec remains below goal.  No issues per RN.  RHC and CMR planned for tomorrow.   11/17 AM update:  Heparin  level undetectable No need for further aPTT's  Goal of Therapy:  Heparin  level 0.3-0.7 units/mL Monitor platelets by anticoagulation protocol: Yes   Plan:  Increase heparin  infusion to 1600 units/hr Heparin  level in 8 hours  Lynwood Mckusick, PharmD, BCPS Clinical Pharmacist Phone: [phone]

## 2024-07-23 NOTE — Assessment & Plan Note (Signed)
 Elevated LFTs on admission; cards concerned for hepatic congestion in setting of heart failure. Liver US  without acute findings and with normal hepatic vasculature flow. Levels improving throughout admission. 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 Hep B Surface Ag negative, indicating either past exposure or current infection in window period.  - HCV RNA quant pending result - Hep B DNA PCR pending result - Continue to follow

## 2024-07-24 ENCOUNTER — Inpatient Hospital Stay (HOSPITAL_COMMUNITY)

## 2024-07-24 ENCOUNTER — Encounter (HOSPITAL_COMMUNITY): Admission: EM | Disposition: A | Payer: Self-pay | Source: Home / Self Care | Attending: Family Medicine

## 2024-07-24 ENCOUNTER — Encounter (HOSPITAL_COMMUNITY): Payer: Self-pay | Admitting: Student

## 2024-07-24 DIAGNOSIS — I4892 Unspecified atrial flutter: Secondary | ICD-10-CM

## 2024-07-24 HISTORY — PX: LEFT HEART CATH AND CORONARY ANGIOGRAPHY: CATH118249

## 2024-07-24 LAB — HEPATITIS B DNA, ULTRAQUANTITATIVE, PCR
HBV DNA SERPL PCR-ACNC: NOT DETECTED [IU]/mL
HBV DNA SERPL PCR-LOG IU: UNDETERMINED {Log_IU}/mL

## 2024-07-24 LAB — COMPREHENSIVE METABOLIC PANEL WITH GFR
ALT: 53 U/L — ABNORMAL HIGH (ref 0–44)
AST: 23 U/L (ref 15–41)
Albumin: 2.1 g/dL — ABNORMAL LOW (ref 3.5–5.0)
Alkaline Phosphatase: 77 U/L (ref 38–126)
Anion gap: 13 (ref 5–15)
BUN: 24 mg/dL — ABNORMAL HIGH (ref 8–23)
CO2: 24 mmol/L (ref 22–32)
Calcium: 8.5 mg/dL — ABNORMAL LOW (ref 8.9–10.3)
Chloride: 98 mmol/L (ref 98–111)
Creatinine, Ser: 1.36 mg/dL — ABNORMAL HIGH (ref 0.61–1.24)
GFR, Estimated: 58 mL/min — ABNORMAL LOW (ref >60)
Glucose, Bld: 152 mg/dL — ABNORMAL HIGH (ref 70–99)
Potassium: 4 mmol/L (ref 3.5–5.1)
Sodium: 135 mmol/L (ref 135–145)
Total Bilirubin: 0.9 mg/dL (ref 0.0–1.2)
Total Protein: 7 g/dL (ref 6.5–8.1)

## 2024-07-24 LAB — IRON AND TIBC
Iron: 25 ug/dL — ABNORMAL LOW (ref 45–182)
Saturation Ratios: 13 % — ABNORMAL LOW (ref 17.9–39.5)
TIBC: 192 ug/dL — ABNORMAL LOW (ref 250–450)
UIBC: 167 ug/dL

## 2024-07-24 LAB — CBC
HCT: 40.9 % (ref 39.0–52.0)
Hemoglobin: 14.7 g/dL (ref 13.0–17.0)
MCH: 30.1 pg (ref 26.0–34.0)
MCHC: 35.9 g/dL (ref 30.0–36.0)
MCV: 83.8 fL (ref 80.0–100.0)
Platelets: 210 K/uL (ref 150–400)
RBC: 4.88 MIL/uL (ref 4.22–5.81)
RDW: 15.6 % — ABNORMAL HIGH (ref 11.5–15.5)
WBC: 10.7 K/uL — ABNORMAL HIGH (ref 4.0–10.5)
nRBC: 0 % (ref 0.0–0.2)

## 2024-07-24 LAB — HEMOGLOBIN A1C
Hgb A1c MFr Bld: 5.5 % (ref 4.8–5.6)
Mean Plasma Glucose: 111.15 mg/dL

## 2024-07-24 LAB — VITAMIN B12: Vitamin B-12: 257 pg/mL (ref 180–914)

## 2024-07-24 LAB — RETICULOCYTES
Immature Retic Fract: 24.4 % — ABNORMAL HIGH (ref 2.3–15.9)
RBC.: 4.88 MIL/uL (ref 4.22–5.81)
Retic Count, Absolute: 74.2 K/uL (ref 19.0–186.0)
Retic Ct Pct: 1.5 % (ref 0.4–3.1)

## 2024-07-24 LAB — COOXEMETRY PANEL
Carboxyhemoglobin: 1.7 % — ABNORMAL HIGH (ref 0.5–1.5)
Methemoglobin: <0.7 % (ref 0.0–1.5)
O2 Saturation: 58.3 %
Total hemoglobin: 15.2 g/dL (ref 12.0–16.0)

## 2024-07-24 LAB — HCV RNA QUANT: HCV Quantitative: NOT DETECTED [IU]/mL (ref >50)

## 2024-07-24 LAB — MAGNESIUM: Magnesium: 1.9 mg/dL (ref 1.7–2.4)

## 2024-07-24 LAB — FOLATE: Folate: 12.5 ng/mL (ref >5.9)

## 2024-07-24 LAB — APTT: aPTT: 91 s — ABNORMAL HIGH (ref 24–36)

## 2024-07-24 LAB — FERRITIN: Ferritin: 287 ng/mL (ref 24–336)

## 2024-07-24 SURGERY — LEFT HEART CATH AND CORONARY ANGIOGRAPHY
Anesthesia: LOCAL

## 2024-07-24 MED ORDER — MAGNESIUM SULFATE 2 GM/50ML IV SOLN
2.0000 g | Freq: Once | INTRAVENOUS | Status: AC
  Administered 2024-07-24: 2 g via INTRAVENOUS
  Filled 2024-07-24: qty 50

## 2024-07-24 MED ORDER — HYDRALAZINE HCL 20 MG/ML IJ SOLN: 10.0000 mg | INTRAMUSCULAR | Status: AC | PRN

## 2024-07-24 MED ORDER — FENTANYL CITRATE (PF) 100 MCG/2ML IJ SOLN
INTRAMUSCULAR | Status: AC
  Filled 2024-07-24: qty 2

## 2024-07-24 MED ORDER — SODIUM CHLORIDE 0.9% FLUSH: 3.0000 mL | INTRAVENOUS | Status: DC | PRN

## 2024-07-24 MED ORDER — LIDOCAINE HCL (PF) 1 % IJ SOLN
INTRAMUSCULAR | Status: DC | PRN
  Administered 2024-07-24: 2 mL

## 2024-07-24 MED ORDER — MIDAZOLAM HCL 2 MG/2ML IJ SOLN
INTRAMUSCULAR | Status: AC
  Filled 2024-07-24: qty 2

## 2024-07-24 MED ORDER — HEPARIN SODIUM (PORCINE) 1000 UNIT/ML IJ SOLN
INTRAMUSCULAR | Status: DC | PRN
Start: 2024-07-24 — End: 2024-07-24
  Administered 2024-07-24: 3000 [IU] via INTRAVENOUS

## 2024-07-24 MED ORDER — HEPARIN SODIUM (PORCINE) 1000 UNIT/ML IJ SOLN
INTRAMUSCULAR | Status: AC
  Filled 2024-07-24: qty 10

## 2024-07-24 MED ORDER — MIDAZOLAM HCL (PF) 2 MG/2ML IJ SOLN
INTRAMUSCULAR | Status: DC | PRN
  Administered 2024-07-24: 1 mg via INTRAVENOUS

## 2024-07-24 MED ORDER — SODIUM CHLORIDE 0.9 % IV SOLN: 250.0000 mL | INTRAVENOUS | Status: DC | PRN

## 2024-07-24 MED ORDER — SODIUM CHLORIDE 0.9% FLUSH
3.0000 mL | Freq: Two times a day (BID) | INTRAVENOUS | Status: DC
  Administered 2024-07-24 – 2024-07-27 (×7): 3 mL via INTRAVENOUS

## 2024-07-24 MED ORDER — AMIODARONE HCL IN DEXTROSE 360-4.14 MG/200ML-% IV SOLN
30.0000 mg/h | INTRAVENOUS | Status: DC
  Administered 2024-07-24 – 2024-07-25 (×4): 30 mg/h via INTRAVENOUS
  Filled 2024-07-24 (×4): qty 200

## 2024-07-24 MED ORDER — HEPARIN (PORCINE) IN NACL 1000-0.9 UT/500ML-% IV SOLN
INTRAVENOUS | Status: DC | PRN
  Administered 2024-07-24 (×3): 500 mL

## 2024-07-24 MED ORDER — LIDOCAINE HCL (PF) 1 % IJ SOLN
INTRAMUSCULAR | Status: AC
  Filled 2024-07-24: qty 30

## 2024-07-24 MED ORDER — SODIUM CHLORIDE 0.9 % IV SOLN: 250.0000 mL | INTRAVENOUS | Status: AC | PRN

## 2024-07-24 MED ORDER — HEPARIN (PORCINE) 25000 UT/250ML-% IV SOLN
2000.0000 [IU]/h | INTRAVENOUS | Status: DC
  Administered 2024-07-25: 2000 [IU]/h via INTRAVENOUS
  Filled 2024-07-24: qty 250

## 2024-07-24 MED ORDER — TECHNETIUM TO 99M ALBUMIN AGGREGATED
3.9000 | Freq: Once | INTRAVENOUS | Status: AC
  Administered 2024-07-24: 3.9 via INTRAVENOUS

## 2024-07-24 MED ORDER — SODIUM CHLORIDE 0.9% FLUSH
3.0000 mL | Freq: Two times a day (BID) | INTRAVENOUS | Status: DC
  Administered 2024-07-24: 3 mL via INTRAVENOUS

## 2024-07-24 MED ORDER — IOHEXOL 350 MG/ML SOLN
INTRAVENOUS | Status: DC | PRN
  Administered 2024-07-24: 30 mL

## 2024-07-24 MED ORDER — FENTANYL CITRATE (PF) 100 MCG/2ML IJ SOLN
INTRAMUSCULAR | Status: DC | PRN
  Administered 2024-07-24: 25 ug via INTRAVENOUS

## 2024-07-24 MED ORDER — DIGOXIN 125 MCG PO TABS
0.1250 mg | ORAL_TABLET | Freq: Every day | ORAL | Status: DC
  Administered 2024-07-24 – 2024-07-27 (×4): 0.125 mg via ORAL
  Filled 2024-07-24 (×4): qty 1

## 2024-07-24 MED ORDER — VERAPAMIL HCL 2.5 MG/ML IV SOLN
INTRAVENOUS | Status: DC | PRN
  Administered 2024-07-24: 10 mL via INTRA_ARTERIAL

## 2024-07-24 MED ORDER — VERAPAMIL HCL 2.5 MG/ML IV SOLN
INTRAVENOUS | Status: AC
  Filled 2024-07-24: qty 2

## 2024-07-24 MED ORDER — ASPIRIN 81 MG PO CHEW: 81.0000 mg | CHEWABLE_TABLET | ORAL | Status: DC

## 2024-07-24 SURGICAL SUPPLY — 10 items
CATH INFINITI 5 FR JL3.5 (CATHETERS) IMPLANT
CATH INFINITI JR4 5F (CATHETERS) IMPLANT
DEVICE RAD COMP TR BAND LRG (VASCULAR PRODUCTS) IMPLANT
ELECT DEFIB PAD ADLT CADENCE (PAD) IMPLANT
GLIDESHEATH SLEND SS 6F .021 (SHEATH) IMPLANT
GUIDEWIRE INQWIRE 1.5J.035X260 (WIRE) IMPLANT
KIT HEART LEFT (KITS) IMPLANT
PACK CARDIAC CATHETERIZATION (CUSTOM PROCEDURE TRAY) ×2 IMPLANT
SHEATH PROBE COVER 6X72 (BAG) IMPLANT
TUBING ART PRESS 72 MALE/FEM (TUBING) IMPLANT

## 2024-07-24 NOTE — Progress Notes (Signed)
 PHARMACY - ANTICOAGULATION CONSULT NOTE  Pharmacy Consult for IV heparin  Indication: atrial fibrillation  No Known Allergies  Patient Measurements: Height: 6' 1 (185.4 cm) Weight: 69.2 kg (152 lb 8.9 oz) IBW/kg (Calculated) : 79.9 HEPARIN  DW (KG): 75.8  Vital Signs: Temp: 98.2 F (36.8 C) (11/18 1352) Temp Source: Oral (11/18 1352) BP: 119/86 (11/18 1355) Pulse Rate: 78 (11/18 1355)  Labs: Recent Labs    07/22/24 0237 07/22/24 0838 07/22/24 1627 07/22/24 2019 07/23/24 0322 07/23/24 0804 07/23/24 0804 07/23/24 1119 07/23/24 1120 07/23/24 1225 07/24/24 0625  HGB  --   --   --   --   --  14.0   < > 14.3 14.3  --  14.7  HCT  --   --   --   --   --  39.2   < > 42.0 42.0  --  40.9  PLT  --   --   --   --   --  148*  --   --   --   --  210  APTT 49*   < > 48* 57* 48*  --   --   --   --   --   --   HEPARINUNFRC 0.77*  --   --   --  <0.10*  --   --   --   --  <0.10*  --   CREATININE 1.61*  --   --   --  1.28*  --   --   --   --   --  1.36*   < > = values in this interval not displayed.    Estimated Creatinine Clearance: 53.7 mL/min (A) (by C-G formula based on SCr of 1.36 mg/dL (H)).   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 We will we ultrasound at followup the    History of pulmonary embolism 11/23/2020   Mediastinal adenopathy 07/25/2018   Multiple lung nodules on CT 07/25/2018   Non-STEMI (non-ST elevated myocardial infarction) (HCC) 01/03/2020   Osteoarthritis of left knee 05/30/2013    Tricompartmental disease Free-floating mass mostly in the superior lateral patellar compartment.    PFO (patent foramen ovale) 01/03/2020   Pulmonary embolism (HCC) 12/29/2019   Renal infarct     Medications:  Medications Prior to Admission  Medication Sig Dispense Refill Last Dose/Taking   acetaminophen  (TYLENOL ) 325 MG tablet Take 650 mg by mouth every 6 (six) hours as needed for moderate pain (pain score 4-6).   Past Month   apixaban  (ELIQUIS ) 2.5 MG TABS tablet Take 1 tablet (2.5 mg total) by mouth 2 (two) times daily. 180 tablet 1 07/19/2024 at  8:30 AM   apixaban  (ELIQUIS ) 5 MG TABS tablet Take 5 mg by mouth 2 (two) times daily.   07/19/2024 at  8:30 AM   Cholecalciferol 50 MCG (2000 UT) TABS Take 50 mcg by mouth.   07/19/2024 at  8:30 AM   diclofenac Sodium (VOLTAREN) 1 % GEL Apply 4 g topically daily as needed (arthritic joint pain).   07/16/2024   furosemide  (  LASIX ) 20 MG tablet Take 20 mg by mouth as needed for edema or fluid.   07/19/2024 Morning   methocarbamol (ROBAXIN) 750 MG tablet Take 750 mg by mouth 2 (two) times daily as needed for muscle spasms.   Past Month   metoprolol  succinate (TOPROL -XL) 50 MG 24 hr tablet Take 1 tablet (50 mg total) by mouth daily. 30 tablet 3 07/19/2024 at  8:30 AM   omeprazole (PRILOSEC) 20 MG capsule Take 20 mg by mouth daily.   07/19/2024 at  8:30 AM   sacubitril -valsartan  (ENTRESTO ) 97-103 MG Take 1 tablet by mouth 2 (two) times daily.   07/19/2024 at  8:30 AM   dicyclomine (BENTYL) 20 MG tablet Take 20 mg by mouth 3 (three) times daily before meals. (Patient not taking: Reported on 07/20/2024)   Not Taking   Scheduled:   Chlorhexidine Gluconate Cloth  6 each Topical Daily   digoxin  0.125 mg Oral Daily   melatonin  3 mg Oral QHS   pantoprazole   40 mg Oral Daily   polyethylene glycol  17 g Oral Daily   sacubitril -valsartan   1 tablet Oral BID   senna-docusate  1 tablet Oral QHS   sertraline  25 mg Oral QHS   sodium chloride  flush  10-40 mL  Intracatheter Q12H   sodium chloride  flush  3 mL Intravenous Q12H   spironolactone  25 mg Oral Daily    Assessment: 26 yoM admitted on 11/13 with acute heart failure. Patient was on apixaban  5 mg BID for aflutter and history of PE and DVTs. Last dose of apixaban  was 11/15 at 0844. Pharmacy was consulted to transition the patient from apixaban  to heparin  due to potential need for procedure during this admission. Baseline Hgb 15 and plt 173. Baseline heparin  level was >1.10 and aPTT was 43. Will dose heparin  based on aPTT until heparin  level and aPTT correlate due to recent DOAC use.   Heparin  level came back subtherapeutic (<0.1), aPTT also subtherapeutic - heparin  was stopped on 11/17@0900  prior to cardiac cath. Underwent cath finding low output HF w/ biventricular involvement and volume overload. Discussed with MD and okay to change to apixaban . No s/sx of bleeding.   PM Update:  Change in plans- plan to place apixaban  on hold again for possible procedures and restart IV Heparin . Apixaban  given this PM at 15:31 PM. MD aware. Prior to switch, heparin  level was sub-therapeutic on 1600 units/hr.   TR band removed 1215.  OK to restart heparin  2h post TR band.   Goal of Therapy:  Heparin  level 0.3-0.7 units/mL Monitor platelets by anticoagulation protocol: Yes   Plan:  Restart heparin  IV 1800 units/hr 6h aPTT  Daily HL/aPTT, monitor s/sx bleeding   Thank you for allowing pharmacy to participate in this patient's care,  Maurilio Fila, PharmD Clinical Pharmacist 07/24/2024  2:39 PM

## 2024-07-24 NOTE — Interval H&P Note (Signed)
 History and Physical Interval Note:  07/24/2024 9:05 AM  Cody Joyce  has presented today for surgery, with the diagnosis of heart failure.  The various methods of treatment have been discussed with the patient and family. After consideration of risks, benefits and other options for treatment, the patient has consented to  Procedure(s): LEFT HEART CATH AND CORONARY ANGIOGRAPHY (N/A) as a surgical intervention.  The patient's history has been reviewed, patient examined, no change in status, stable for surgery.  I have reviewed the patient's chart and labs.  Questions were answered to the patient's satisfaction.     Jaeson Molstad

## 2024-07-24 NOTE — Assessment & Plan Note (Signed)
 HTN: Entresto  24-26 PO BID Atrial flutter  Hx PE: Holding home Eliquis  for potential RHC; on heparin  currently Alcohol dependence: CIWAs unremarkable so far during admission.  Last drink 11/13 per patient. Hx of smoking : Nicotine patch was offered but pt refused GERD: Protonix  40 mg daily Trouble sleeping: Melatonin daily at bedtime CKD 3a : sCr continue to improve 1.28 this AM ( 1.61 >>1.39 yesterday). Baseline around 1.5. Normal BUN and GFR Depression : Continue to follow mood symptoms

## 2024-07-24 NOTE — Progress Notes (Signed)
 PHARMACY - ANTICOAGULATION CONSULT NOTE  Pharmacy Consult for IV heparin  Indication: atrial fibrillation  No Known Allergies  Patient Measurements: Height: 6' 1 (185.4 cm) Weight: 69.2 kg (152 lb 8.9 oz) IBW/kg (Calculated) : 79.9 HEPARIN  DW (KG): 75.8  Vital Signs: Temp: 97.4 F (36.3 C) (11/18 1659) Temp Source: Oral (11/18 1659) BP: 131/90 (11/18 1659) Pulse Rate: 84 (11/18 1659)  Labs: Recent Labs    07/22/24 0237 07/22/24 0838 07/22/24 2019 07/23/24 0322 07/23/24 0804 07/23/24 0804 07/23/24 1119 07/23/24 1120 07/23/24 1225 07/24/24 0625 07/24/24 2147  HGB  --   --   --   --  14.0   < > 14.3 14.3  --  14.7  --   HCT  --   --   --   --  39.2   < > 42.0 42.0  --  40.9  --   PLT  --   --   --   --  148*  --   --   --   --  210  --   APTT 49*   < > 57* 48*  --   --   --   --   --   --  91*  HEPARINUNFRC 0.77*  --   --  <0.10*  --   --   --   --  <0.10*  --   --   CREATININE 1.61*  --   --  1.28*  --   --   --   --   --  1.36*  --    < > = values in this interval not displayed.    Estimated Creatinine Clearance: 53.7 mL/min (A) (by C-G formula based on SCr of 1.36 mg/dL (H)).   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 We will we ultrasound at followup the    History of pulmonary embolism 11/23/2020   Mediastinal adenopathy 07/25/2018   Multiple lung nodules on CT 07/25/2018   Non-STEMI (non-ST elevated myocardial infarction) (HCC) 01/03/2020   Osteoarthritis of left knee 05/30/2013    Tricompartmental disease Free-floating mass mostly in the superior lateral patellar compartment.    PFO (patent foramen ovale) 01/03/2020   Pulmonary embolism (HCC) 12/29/2019   Renal infarct     Medications:  Medications Prior to Admission  Medication Sig Dispense Refill Last Dose/Taking   acetaminophen  (TYLENOL ) 325 MG tablet Take 650 mg by mouth every 6 (six) hours as needed for moderate pain (pain score 4-6).   Past Month   apixaban  (ELIQUIS ) 2.5 MG TABS tablet Take 1 tablet (2.5 mg total) by mouth 2 (two) times daily. 180 tablet 1 07/19/2024 at  8:30 AM   apixaban  (ELIQUIS ) 5 MG TABS tablet Take 5 mg by mouth 2 (two) times daily.   07/19/2024 at  8:30 AM   Cholecalciferol 50 MCG (2000 UT) TABS Take 50 mcg by mouth.   07/19/2024 at  8:30 AM   diclofenac Sodium (VOLTAREN) 1 % GEL Apply 4 g topically daily as needed (arthritic joint pain).   07/16/2024   furosemide  (  LASIX ) 20 MG tablet Take 20 mg by mouth as needed for edema or fluid.   07/19/2024 Morning   methocarbamol (ROBAXIN) 750 MG tablet Take 750 mg by mouth 2 (two) times daily as needed for muscle spasms.   Past Month   metoprolol  succinate (TOPROL -XL) 50 MG 24 hr tablet Take 1 tablet (50 mg total) by mouth daily. 30 tablet 3 07/19/2024 at  8:30 AM   omeprazole (PRILOSEC) 20 MG capsule Take 20 mg by mouth daily.   07/19/2024 at  8:30 AM   sacubitril -valsartan  (ENTRESTO ) 97-103 MG Take 1 tablet by mouth 2 (two) times daily.   07/19/2024 at  8:30 AM   dicyclomine (BENTYL) 20 MG tablet Take 20 mg by mouth 3 (three) times daily before meals. (Patient not taking: Reported on 07/20/2024)   Not Taking   Scheduled:   Chlorhexidine Gluconate Cloth  6 each Topical Daily   digoxin  0.125 mg Oral Daily   melatonin  3 mg Oral QHS   pantoprazole   40 mg Oral Daily   polyethylene glycol  17 g Oral Daily   sacubitril -valsartan   1 tablet Oral BID   senna-docusate  1 tablet Oral QHS   sertraline  25 mg Oral QHS   sodium chloride  flush  10-40 mL  Intracatheter Q12H   sodium chloride  flush  3 mL Intravenous Q12H   spironolactone  25 mg Oral Daily    Assessment: 50 yoM admitted on 11/13 with acute heart failure. Patient was on apixaban  5 mg BID for aflutter and history of PE and DVTs. Last dose of apixaban  was 11/15 at 0844. Pharmacy was consulted to transition the patient from apixaban  to heparin  due to potential need for procedure during this admission. Baseline Hgb 15 and plt 173. Baseline heparin  level was >1.10 and aPTT was 43. Will dose heparin  based on aPTT until heparin  level and aPTT correlate due to recent DOAC use.   Heparin  was paused for cardiac cath 11/18. TR band was removed at 12:15 PM. Heparin  resumed 2hrs post TR band.  aPTT post restart is therapeutic at 91 on current rate of 1800 units/hr.   Goal of Therapy:  Heparin  level 0.3-0.7 units/mL aPTT 66-102 Monitor platelets by anticoagulation protocol: Yes   Plan:  Continue Heparin  at 1800 units/hr.  Heparin  levels and aPTT were correlating prior to cath - will continue to follow Heparin  levels going forward.  Follow up daily Heparin  level.  Monitor for signs and symptoms of bleeding   Thank you for allowing pharmacy to participate in this patient's care,  Harlene Boga, PharmD, BCPS, BCCCP Please refer to [facility] for [facility] Pharmacy numbers 07/24/2024  10:13 PM

## 2024-07-24 NOTE — Progress Notes (Signed)
 Daily Progress Note Intern Pager: [phone]  Patient name: Cody Joyce Medical record number: [id number] Date of birth: 02/24/1961 Age: 63 y.o. Gender: male  Primary Care Provider: [facility] Consultants: None Code Status: Full  Pt Overview and Major Events to Date:  11/13: Admitted to FMTS  Medical Decision Making:  YUKIO BISPING is a 63 y.o. male with PMH of HFrEF, CKD 3a, PE on Eliquis , COPD, Alcohol Use Disorder. Admitted for acute decompensated heart failure in setting of reduced med adherence 2/2 financial difficulties. Found to have significantly decreased EF on echo at admission, LVEF < 20%. Cards following. Planning for stress cardiac MRI with possible RHC following Assessment & Plan Acute on chronic systolic heart failure (HCC) Out 2.075 L yesterday 11/15. Weight today of 153 lbs, down ~ 4 lbs from yesterday.Stress cardiac MRI on 11/17 to evaluate for new ischemia in setting of reduced EF and RHC on 11/17 RHC which showed RA 13, PA 52/29 (39), PCW 26, Fick CO/CI 3.5/1.8, TD CO/CI 3.5/1.8, PAPi 1.8, PaSat 51%. He was started on IV milrinone. cMRI today showed LVEF 19%, mod LV dilatation. Inferior wall is akinetic with LGE and possible mural thrombus. LGE in coronary distrubution.  Findings worrisome for CAD vs sarcoid vs myocarditis. RVEF 27%.   - Appreciate Cardiology recommendations: - Plan on LHC today  - Continue Metoprolol  tartrate 12.5 mg BID while inpatient; return to home regimen of metoprolol  succinate 25 mg daily at discharge as possible - Continue spironolactone to 25 mg daily - Will need ICD  - Cont Entresto  24-26 mg BID (continue this dose per cards) - Strict I/O, daily weight  - Labs: AM CMP, Mag - PT signed off 11/15, no further PT needs; OT signed off 11/16, no further OT needs - TOC consulted re: medication affordability, substance use - Pharmacy queried re: cost of SGLT2 inhibitors Hepatocellular injury Elevated LFTs on admission;  cards concerned for hepatic congestion in setting of heart failure. Liver US  without acute findings and with normal hepatic vasculature flow. Levels improving throughout admission. 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 Hep B Surface Ag negative, indicating either past exposure or current infection in window period.  - HCV RNA quant pending result - Hep B DNA PCR pending result - Continue to follow Anxiety Improved w/ medication - Continue Zoloft 25 mg QHS Chronic health problem HTN: Entresto  24-26 PO BID Atrial flutter  Hx PE: Holding home Eliquis  for potential RHC; on heparin  currently Alcohol dependence: CIWAs unremarkable so far during admission.  Last drink 11/13 per patient. Hx of smoking : Nicotine patch was offered but pt refused GERD: Protonix  40 mg daily Trouble sleeping: Melatonin daily at bedtime CKD 3a : sCr continue to improve 1.28 this AM ( 1.61 >>1.39 yesterday). Baseline around 1.5. Normal BUN and GFR Depression : Continue to follow mood symptoms  FEN/GI: Heart healthy after LHC today  PPx: Heparin  gtt Dispo: Home pending continued cardiac workup and medication management.  Subjective:  Sitting at bedside this AM. States feeling much better emotionally after starting on Zoloft. Patient seems to be in a very good spirit this AM.  Objective: Temp:  [98.1 F (36.7 C)-98.6 F (37 C)] 98.4 F (36.9 C) (11/18 0753) Pulse Rate:  [78-102] 100 (11/18 0753) Resp:  [12-21] 18 (11/18 0753) BP: (100-125)/(65-99) 104/71 (11/18 0753) SpO2:  [86 %-96 %] 96 % (11/18 0933) Weight:  [69.2 kg] 69.2 kg (11/18 0500) Physical Exam: General: Rest in  bed, in no acute distress. Cardiovascular: Regular rate and rhythm Respiratory: No distress on RA Abdomen: Soft, nondistended, nontender. Last BM 07/22/24 Extremities: Skin warm, dry. No bilateral lower extremity edema. Psych: Anxious   Laboratory: Most recent CBC Lab Results  Component Value Date   WBC 10.7 (H)  07/24/2024   HGB 14.7 07/24/2024   HCT 40.9 07/24/2024   MCV 83.8 07/24/2024   PLT 210 07/24/2024   Most recent BMP    Latest Ref Rng & Units 07/24/2024    6:25 AM  BMP  Glucose 70 - 99 mg/dL 847   BUN 8 - 23 mg/dL 24   Creatinine 9.38 - 1.24 mg/dL 8.63   Sodium 864 - 854 mmol/L 135   Potassium 3.5 - 5.1 mmol/L 4.0   Chloride 98 - 111 mmol/L 98   CO2 22 - 32 mmol/L 24   Calcium 8.9 - 10.3 mg/dL 8.5   Hepatic Function Panel     Component Value Date/Time   PROT 7.0 07/24/2024 0625   ALBUMIN 2.1 (L) 07/24/2024 0625   AST 23 07/24/2024 0625   ALT 53 (H) 07/24/2024 0625   ALKPHOS 77 07/24/2024 0625   BILITOT 0.9 07/24/2024 0625   BILIDIR 0.4 (H) 01/01/2020 0254   IBILI 0.6 01/01/2020 0254  Hep B Surface Ag: nonreactive  HCV Ab: reactive  Hep A IgM: nonreactive  Hep B C IgM: Reactive   Mag: 2.0 TSH: 2.304  Imaging/Diagnostic Tests: Liver US  11/15:  1. No acute findings. 2. Hepatic vasculature demonstrates normal flow in the normal direction.  Suzen Houston NOVAK, DO 07/24/2024, 9:53 AM  PGY-1, [facility] FPTS Intern pager: [phone], text pages welcome Secure chat group  [facility] Teaching Service

## 2024-07-24 NOTE — Plan of Care (Signed)
  Problem: Education: Goal: Knowledge of General Education information will improve Description: Including pain rating scale, medication(s)/side effects and non-pharmacologic comfort measures Outcome: Progressing   Problem: Health Behavior/Discharge Planning: Goal: Ability to manage health-related needs will improve Outcome: Progressing   Problem: Clinical Measurements: Goal: Ability to maintain clinical measurements within normal limits will improve Outcome: Progressing Goal: Diagnostic test results will improve Outcome: Progressing Goal: Respiratory complications will improve Outcome: Progressing Goal: Cardiovascular complication will be avoided Outcome: Progressing   Problem: Activity: Goal: Risk for activity intolerance will decrease Outcome: Progressing   Problem: Education: Goal: Understanding of CV disease, CV risk reduction, and recovery process will improve Outcome: Progressing Goal: Individualized Educational Video(s) Outcome: Progressing   Problem: Activity: Goal: Ability to return to baseline activity level will improve Outcome: Progressing   Problem: Cardiovascular: Goal: Ability to achieve and maintain adequate cardiovascular perfusion will improve Outcome: Progressing Goal: Vascular access site(s) Level 0-1 will be maintained Outcome: Progressing   Problem: Health Behavior/Discharge Planning: Goal: Ability to safely manage health-related needs after discharge will improve Outcome: Progressing   Problem: Education: Goal: Understanding of CV disease, CV risk reduction, and recovery process will improve Outcome: Progressing Goal: Individualized Educational Video(s) Outcome: Progressing   Problem: Activity: Goal: Ability to return to baseline activity level will improve Outcome: Progressing   Problem: Cardiovascular: Goal: Ability to achieve and maintain adequate cardiovascular perfusion will improve Outcome: Progressing Goal: Vascular access site(s)  Level 0-1 will be maintained Outcome: Progressing   Problem: Health Behavior/Discharge Planning: Goal: Ability to safely manage health-related needs after discharge will improve Outcome: Progressing

## 2024-07-24 NOTE — H&P (View-Only) (Signed)
 Advanced Heart Failure Rounding Note  Cardiologist: Jerel Balding, MD  Chief Complaint: acute on chronic systolic heart failure w/ low output    Patient Profile   Cody Joyce is a 63 y.o. male with chronic HFrEF, NicM, HTN, CKD 3a, Hx submassive PE/DVT. A flutter, COPD alcohol use disorder and former smoker. AHF team to see with A/C HFrEF>BiV HF.   Cardiac Studies:    Echo 11/14: EF <20%, LV apical slow very sluggish, LV with GHK, GIIIDD, RV mildly reduced, mildly elevated PASP, LA severely dilated, RA mildly dilated, mild MR   RHC 11/17: RA 13, PA 52/29 (39), PCW 26, Fick CO/CI 3.5/1.8, TD CO/CI 3.5/1.8, PAPi 1.8, PaSat 51%   cMRI 11/17: LVEF 19%, mod LV dilatation. Inferior wall is akinetic with LGE and possible mural thrombus. LGE in coronary distrubution. Findings worrisome for CAD vs sarcoid vs myocarditis. RVEF 27%.   Subjective:    Started on Milrinone 0.25 post RHC. Co-ox 58% today.  Reports good UOP yesterday. I/Os incomplete. Several unmeasured urinary occurrences. Wt down 3 lb. Has PICC but CVP not ordered.   Scr 1.61>>1.36 K 4.0 Mg 1.9   Runs of NSVT overnight, longest 14 beats.   Feels better. Breathing much improved. Denies CP. Sitting up eating breakfast.     Objective:   Weight Range: 69.2 kg Body mass index is 20.13 kg/m.   Vital Signs:   Temp:  [98.1 F (36.7 C)-98.9 F (37.2 C)] 98.2 F (36.8 C) (11/18 0400) Pulse Rate:  [78-102] 90 (11/18 0400) Resp:  [12-21] 20 (11/18 0400) BP: (100-127)/(65-99) 125/99 (11/18 0400) SpO2:  [86 %-97 %] 92 % (11/18 0400) Weight:  [69.2 kg] 69.2 kg (11/18 0500) Last BM Date : 07/22/24  Weight change: Filed Weights   07/22/24 0548 07/23/24 0555 07/24/24 0500  Weight: 69.5 kg 70.7 kg 69.2 kg    Intake/Output:   Intake/Output Summary (Last 24 hours) at 07/24/2024 9287 Last data filed at 07/23/2024 2355 Gross per 24 hour  Intake 490 ml  Output 1300 ml  Net -810 ml      Physical Exam     General:  Well appearing. No resp difficulty HEENT: Normal Neck: JVP 8 cm. Cor: PMI nondisplaced. Regular rate & rhythm. No rubs, gallops or murmurs. Lungs: diminished at the bases  Abdomen: Soft, nontender, nondistended. No hepatosplenomegaly. No bruits or masses. Good bowel sounds. Extremities: trace b/l LEE, RUE PICC Neuro: Alert & orientedx3, cranial nerves grossly intact. moves all 4 extremities w/o difficulty. Affect pleasant   Telemetry   NSR 80s, runs of NSVT, longest 14 beats   EKG    N/A   Labs    CBC Recent Labs    07/23/24 0804 07/23/24 1119 07/23/24 1120 07/24/24 0625  WBC 13.5*  --   --  10.7*  HGB 14.0   < > 14.3 14.7  HCT 39.2   < > 42.0 40.9  MCV 84.3  --   --  83.8  PLT 148*  --   --  210   < > = values in this interval not displayed.   Basic Metabolic Panel Recent Labs    88/83/74 0237 07/23/24 0322 07/23/24 1119 07/23/24 1120  NA 137 133* 140 139  K 4.2 4.6 4.5 4.6  CL 102 106  --   --   CO2 25 20*  --   --   GLUCOSE 118* 102*  --   --   BUN 22 22  --   --  CREATININE 1.61* 1.28*  --   --   CALCIUM 8.1* 8.1*  --   --   MG 2.0 1.8  --   --    Liver Function Tests Recent Labs    07/22/24 0237 07/23/24 0322  AST 36 23  ALT 83* 61*  ALKPHOS 82 71  BILITOT 1.2 1.2  PROT 6.2* 6.4*  ALBUMIN 2.0* 1.9*   No results for input(s): LIPASE, AMYLASE in the last 72 hours. Cardiac Enzymes No results for input(s): CKTOTAL, CKMB, CKMBINDEX, TROPONINI in the last 72 hours.  BNP: BNP (last 3 results) Recent Labs    07/19/24 1324  BNP >4,500.0*    ProBNP (last 3 results) No results for input(s): PROBNP in the last 8760 hours.   D-Dimer No results for input(s): DDIMER in the last 72 hours. Hemoglobin A1C No results for input(s): HGBA1C in the last 72 hours. Fasting Lipid Panel No results for input(s): CHOL, HDL, LDLCALC, TRIG, CHOLHDL, LDLDIRECT in the last 72 hours. Thyroid Function Tests Recent Labs     07/22/24 0237  TSH 2.304    Other results:   Imaging    MR CARDIAC MORPHOLOGY W WO CONTRAST Result Date: 07/23/2024 CLINICAL DATA:  NICM with worsening EF COMPARISON: Echo 07/20/24 EXAM: MR CARDIA MORPHOLOGY WITHOUT AND WITH CONTRAST; MR CARDIAC VELOCITY FLOW MAPPING TECHNIQUE: The patient was scanned on a 1.5 Tesla Siemens magnet. A dedicated cardiac coil was used. Functional imaging was done using TrueFisp sequences. 2,3, and 4 chamber views were done to assess for RWMA's. Modified Simpson's rule using a short axis stack was used to calculate an ejection fraction on a dedicated work [employer]. The patient received 10mL GADAVIST GADOBUTROL 1 MMOL/ML IV SOLN. After 10 minutes inversion recovery sequences were used to assess for infiltration and scar tissue. Phase contrast velocity encoded images obtained x 2. This examination is tailored for evaluation cardiac anatomy and function and provides very limited assessment of noncardiac structures, which are accordingly not evaluated during interpretation. If there is clinical concern for extracardiac pathology, further evaluation with CT imaging should be considered. FINDINGS: LEFT VENTRICLE: Left ventricular chamber size: Moderately dilated. Left ventricular wall thickness: Mild LVH. Maximal wall thickness 13 mm in the mid septal wall. Left ventricular systolic function: Severely reduced LVEF = 19% Severe global hypokinesis with akinesis of the inferior wall No myocardial edema, T2 = 50 msec Normal first pass perfusion. There is post contrast delayed myocardial enhancement: There is transmural delayed enhancement in the inferior wall at the base (nonviable). At the mid ventricle, there is at least 50% wall thickness delayed enhancement subendocardial and midmyocardial, likely nonviable segment, with concern for associated mural thrombus. While this LGE pattern follows a coronary distribution, if no significant CAD is found, consider  FDG PET for cardiac sarcoidosis evaluation. No LGE seen at the apex. Normal T1 myocardial nulling kinetics suggest against a diagnosis of cardiac amyloidosis. ECV = 33%, mildly elevated RIGHT VENTRICLE: Moderately dilated right ventricular chamber size. Normal right ventricular wall thickness. Moderately reduced right ventricular systolic function. RVEF = 27% Global hypokinesis. No post contrast delayed myocardial enhancement. ATRIA: Biatrial dilation. PERICARDIUM: Normal pericardium.  Trivial pericardial effusion. OTHER: Abnormalities of the bilateral lung parenchyma, see recent CT chest imaging 07/19/24. MEASUREMENTS: VALVES: Tricuspid aortic valve. Aortic valve regurgitation: Mild, regurgitant fraction 9% Pulmonary valve regurgitation: Performed but not reported due to quality Mitral valve regurgitation: Mild, regurgitant fraction 13% Tricuspid valve regurgitation: Performed but not reported due to quality Left ventricle: LV male  LV EF: 19% (normal 49-79%) Absolute volumes: LV EDV: (Normal 95-215 mL) LV ESV: (Normal 25-85 mL) LV SV: 53mL (Normal 61-145 mL) CO: 4.2L/min (Normal 3.4-7.8 L/min) Indexed volumes: CI: 2.2L/min/sq-m (Normal 1.8-4.2 L/min/sq-m) LV EDV: 136mL/sq-m (Normal 50-108 mL/sq-m) LV ESV: 148mL/sq-m (Normal 11-47 mL/sq-m) LV SV: 24mL/sq-m (Normal 33-72 mL/sq-m) Right ventricle: RV male RV EF:  27% (Normal 51-80%) Absolute volumes: RV EDV: (Normal 109-217 mL) RV ESV: (Normal 23-91 mL) RV SV: 67mL (Normal 71-141 mL) CO: 5.3L/min (Normal 2.8-8.8 L/min) Indexed volumes: CI: 2.8L/min/sq-m (Normal 1.7-4.2 L/min/sq-m) RV EDV: 141mL/sq-m (Normal 58-109 mL/sq-m) RV ESV: 32mL/sq-m (Normal 12-46 mL/sq-m) RV SV: 84mL/sq-m (Normal 38-71 mL/sq-m) IMPRESSION: 1. Severe left ventricular systolic dysfunction, LVEF 19%. Moderate LV dilation. There is transmural delayed enhancement in the inferior wall at the base (nonviable). At the mid ventricle, there is at least 50% wall thickness delayed  enhancement subendocardial and midmyocardial, likely nonviable segment, with concern for associated mural thrombus. Consider coronary evaluation. While this LGE pattern follows a coronary distribution, if no significant CAD is found, consider FDG PET for cardiac sarcoidosis evaluation. May also represent prior myocarditis, but is less likely given the LGE is subendocardial and midmyocardial at the mid level. There is also a small focus of LGE in the inferior RV insertion point, nonspecific but suggests pulmonary hypertension. 2. Moderate right ventricular systolic dysfunction, RVEF 27%. Moderate RV dilation. No delayed myocardial enhancement. Electronically Signed   By: Soyla Merck M.D.   On: 07/23/2024 13:44   MR CARDIAC VELOCITY FLOW MAP Result Date: 07/23/2024 CLINICAL DATA:  NICM with worsening EF COMPARISON: Echo 07/20/24 EXAM: MR CARDIA MORPHOLOGY WITHOUT AND WITH CONTRAST; MR CARDIAC VELOCITY FLOW MAPPING TECHNIQUE: The patient was scanned on a 1.5 Tesla Siemens magnet. A dedicated cardiac coil was used. Functional imaging was done using TrueFisp sequences. 2,3, and 4 chamber views were done to assess for RWMA's. Modified Simpson's rule using a short axis stack was used to calculate an ejection fraction on a dedicated work [employer]. The patient received 10mL GADAVIST GADOBUTROL 1 MMOL/ML IV SOLN. After 10 minutes inversion recovery sequences were used to assess for infiltration and scar tissue. Phase contrast velocity encoded images obtained x 2. This examination is tailored for evaluation cardiac anatomy and function and provides very limited assessment of noncardiac structures, which are accordingly not evaluated during interpretation. If there is clinical concern for extracardiac pathology, further evaluation with CT imaging should be considered. FINDINGS: LEFT VENTRICLE: Left ventricular chamber size: Moderately dilated. Left ventricular wall thickness: Mild LVH. Maximal wall  thickness 13 mm in the mid septal wall. Left ventricular systolic function: Severely reduced LVEF = 19% Severe global hypokinesis with akinesis of the inferior wall No myocardial edema, T2 = 50 msec Normal first pass perfusion. There is post contrast delayed myocardial enhancement: There is transmural delayed enhancement in the inferior wall at the base (nonviable). At the mid ventricle, there is at least 50% wall thickness delayed enhancement subendocardial and midmyocardial, likely nonviable segment, with concern for associated mural thrombus. While this LGE pattern follows a coronary distribution, if no significant CAD is found, consider FDG PET for cardiac sarcoidosis evaluation. No LGE seen at the apex. Normal T1 myocardial nulling kinetics suggest against a diagnosis of cardiac amyloidosis. ECV = 33%, mildly elevated RIGHT VENTRICLE: Moderately dilated right ventricular chamber size. Normal right ventricular wall thickness. Moderately reduced right ventricular systolic function. RVEF = 27% Global hypokinesis. No post contrast delayed myocardial enhancement. ATRIA: Biatrial dilation.  PERICARDIUM: Normal pericardium.  Trivial pericardial effusion. OTHER: Abnormalities of the bilateral lung parenchyma, see recent CT chest imaging 07/19/24. MEASUREMENTS: VALVES: Tricuspid aortic valve. Aortic valve regurgitation: Mild, regurgitant fraction 9% Pulmonary valve regurgitation: Performed but not reported due to quality Mitral valve regurgitation: Mild, regurgitant fraction 13% Tricuspid valve regurgitation: Performed but not reported due to quality Left ventricle: LV male LV EF: 19% (normal 49-79%) Absolute volumes: LV EDV: (Normal 95-215 mL) LV ESV: (Normal 25-85 mL) LV SV: 53mL (Normal 61-145 mL) CO: 4.2L/min (Normal 3.4-7.8 L/min) Indexed volumes: CI: 2.2L/min/sq-m (Normal 1.8-4.2 L/min/sq-m) LV EDV: 12mL/sq-m (Normal 50-108 mL/sq-m) LV ESV: 147mL/sq-m (Normal 11-47 mL/sq-m) LV SV: 90mL/sq-m (Normal  33-72 mL/sq-m) Right ventricle: RV male RV EF:  27% (Normal 51-80%) Absolute volumes: RV EDV: (Normal 109-217 mL) RV ESV: (Normal 23-91 mL) RV SV: 67mL (Normal 71-141 mL) CO: 5.3L/min (Normal 2.8-8.8 L/min) Indexed volumes: CI: 2.8L/min/sq-m (Normal 1.7-4.2 L/min/sq-m) RV EDV: 115mL/sq-m (Normal 58-109 mL/sq-m) RV ESV: 32mL/sq-m (Normal 12-46 mL/sq-m) RV SV: 56mL/sq-m (Normal 38-71 mL/sq-m) IMPRESSION: 1. Severe left ventricular systolic dysfunction, LVEF 19%. Moderate LV dilation. There is transmural delayed enhancement in the inferior wall at the base (nonviable). At the mid ventricle, there is at least 50% wall thickness delayed enhancement subendocardial and midmyocardial, likely nonviable segment, with concern for associated mural thrombus. Consider coronary evaluation. While this LGE pattern follows a coronary distribution, if no significant CAD is found, consider FDG PET for cardiac sarcoidosis evaluation. May also represent prior myocarditis, but is less likely given the LGE is subendocardial and midmyocardial at the mid level. There is also a small focus of LGE in the inferior RV insertion point, nonspecific but suggests pulmonary hypertension. 2. Moderate right ventricular systolic dysfunction, RVEF 27%. Moderate RV dilation. No delayed myocardial enhancement. Electronically Signed   By: Soyla Merck M.D.   On: 07/23/2024 13:44   MR CARDIAC VELOCITY FLOW MAP Result Date: 07/23/2024 CLINICAL DATA:  NICM with worsening EF COMPARISON: Echo 07/20/24 EXAM: MR CARDIA MORPHOLOGY WITHOUT AND WITH CONTRAST; MR CARDIAC VELOCITY FLOW MAPPING TECHNIQUE: The patient was scanned on a 1.5 Tesla Siemens magnet. A dedicated cardiac coil was used. Functional imaging was done using TrueFisp sequences. 2,3, and 4 chamber views were done to assess for RWMA's. Modified Simpson's rule using a short axis stack was used to calculate an ejection fraction on a dedicated work [employer]. The  patient received 10mL GADAVIST GADOBUTROL 1 MMOL/ML IV SOLN. After 10 minutes inversion recovery sequences were used to assess for infiltration and scar tissue. Phase contrast velocity encoded images obtained x 2. This examination is tailored for evaluation cardiac anatomy and function and provides very limited assessment of noncardiac structures, which are accordingly not evaluated during interpretation. If there is clinical concern for extracardiac pathology, further evaluation with CT imaging should be considered. FINDINGS: LEFT VENTRICLE: Left ventricular chamber size: Moderately dilated. Left ventricular wall thickness: Mild LVH. Maximal wall thickness 13 mm in the mid septal wall. Left ventricular systolic function: Severely reduced LVEF = 19% Severe global hypokinesis with akinesis of the inferior wall No myocardial edema, T2 = 50 msec Normal first pass perfusion. There is post contrast delayed myocardial enhancement: There is transmural delayed enhancement in the inferior wall at the base (nonviable). At the mid ventricle, there is at least 50% wall thickness delayed enhancement subendocardial and midmyocardial, likely nonviable segment, with concern for associated mural thrombus. While this LGE pattern follows a coronary distribution, if no significant CAD is found,  consider FDG PET for cardiac sarcoidosis evaluation. No LGE seen at the apex. Normal T1 myocardial nulling kinetics suggest against a diagnosis of cardiac amyloidosis. ECV = 33%, mildly elevated RIGHT VENTRICLE: Moderately dilated right ventricular chamber size. Normal right ventricular wall thickness. Moderately reduced right ventricular systolic function. RVEF = 27% Global hypokinesis. No post contrast delayed myocardial enhancement. ATRIA: Biatrial dilation. PERICARDIUM: Normal pericardium.  Trivial pericardial effusion. OTHER: Abnormalities of the bilateral lung parenchyma, see recent CT chest imaging 07/19/24. MEASUREMENTS: VALVES:  Tricuspid aortic valve. Aortic valve regurgitation: Mild, regurgitant fraction 9% Pulmonary valve regurgitation: Performed but not reported due to quality Mitral valve regurgitation: Mild, regurgitant fraction 13% Tricuspid valve regurgitation: Performed but not reported due to quality Left ventricle: LV male LV EF: 19% (normal 49-79%) Absolute volumes: LV EDV: (Normal 95-215 mL) LV ESV: (Normal 25-85 mL) LV SV: 53mL (Normal 61-145 mL) CO: 4.2L/min (Normal 3.4-7.8 L/min) Indexed volumes: CI: 2.2L/min/sq-m (Normal 1.8-4.2 L/min/sq-m) LV EDV: 143mL/sq-m (Normal 50-108 mL/sq-m) LV ESV: 122mL/sq-m (Normal 11-47 mL/sq-m) LV SV: 50mL/sq-m (Normal 33-72 mL/sq-m) Right ventricle: RV male RV EF:  27% (Normal 51-80%) Absolute volumes: RV EDV: (Normal 109-217 mL) RV ESV: (Normal 23-91 mL) RV SV: 67mL (Normal 71-141 mL) CO: 5.3L/min (Normal 2.8-8.8 L/min) Indexed volumes: CI: 2.8L/min/sq-m (Normal 1.7-4.2 L/min/sq-m) RV EDV: 192mL/sq-m (Normal 58-109 mL/sq-m) RV ESV: 73mL/sq-m (Normal 12-46 mL/sq-m) RV SV: 82mL/sq-m (Normal 38-71 mL/sq-m) IMPRESSION: 1. Severe left ventricular systolic dysfunction, LVEF 19%. Moderate LV dilation. There is transmural delayed enhancement in the inferior wall at the base (nonviable). At the mid ventricle, there is at least 50% wall thickness delayed enhancement subendocardial and midmyocardial, likely nonviable segment, with concern for associated mural thrombus. Consider coronary evaluation. While this LGE pattern follows a coronary distribution, if no significant CAD is found, consider FDG PET for cardiac sarcoidosis evaluation. May also represent prior myocarditis, but is less likely given the LGE is subendocardial and midmyocardial at the mid level. There is also a small focus of LGE in the inferior RV insertion point, nonspecific but suggests pulmonary hypertension. 2. Moderate right ventricular systolic dysfunction, RVEF 27%. Moderate RV dilation. No delayed  myocardial enhancement. Electronically Signed   By: Soyla Merck M.D.   On: 07/23/2024 13:44   US  EKG SITE RITE Result Date: 07/23/2024 If Site Rite image not attached, placement could not be confirmed due to current cardiac rhythm.  CARDIAC CATHETERIZATION Result Date: 07/23/2024 Findings: RA = 13 RV = 51/15 PA = 52/29 (39) PCW = 26 Fick cardiac output/index = 3.5/1.8 TD CO/CI = 3.5/1.8 PVR = 3.8 WU Ao sat = 91% PA sat = 51%, 52\3% PAPi = 1.8 Assessment: 1. Low output HF with biventricular involvement and volume overload 2. Moderate mixed pulmonary HTN Plan/Discussion: Start milrinone. Continue diuresis. Stop b-blocker. Await cMRI. May need VQ to reassess for residual CTEPH Toribio Fuel, MD 11:34 AM    Medications:     Scheduled Medications:  Chlorhexidine Gluconate Cloth  6 each Topical Daily   melatonin  3 mg Oral QHS   pantoprazole   40 mg Oral Daily   polyethylene glycol  17 g Oral Daily   sacubitril -valsartan   1 tablet Oral BID   senna-docusate  1 tablet Oral QHS   sertraline  25 mg Oral QHS   sodium chloride  flush  10-40 mL Intracatheter Q12H   spironolactone  25 mg Oral Daily    Infusions:  heparin  1,800 Units/hr (07/24/24 0350)   milrinone 0.25 mcg/kg/min (07/23/24 1227)    PRN  Medications: acetaminophen , sodium chloride  flush    Assessment/Plan   A/C HFrEF>>BiV HF - Echo 4/21: EF 35-40%, GHK, mod reduced RV - [facility] 4/21: normal coronaries. Hemodynamics relatively stable just mild pulmonary hypertension.  - NYHA IV on admission. Suspect exacerbation 2/2 ETOH abuse vs medication noncompliance. ? Coronary diseases vs Sarcoid - Echo this admit EF <20%, LV apical slow very sluggish, LV with GHK, GIIIDD, RV mildly reduced, mildly elevated PASP, LA severely dilated, RA mildly dilated, mild MR.  - RHC 11/17 RA 13, PA 52/29 (39), PCW 26, Fick CO/CI 3.5/1.8, TD CO/CI 3.5/1.8, PAPi 1.8, PaSat 51%.  - cMRI showed LVEF 19%, mod LV dilatation. Inferior wall is akinetic  with LGE and possible mural thrombus. LGE in coronary distrubution.  Findings worrisome for CAD vs sarcoid vs myocarditis. RVEF 27%.  - Started on milrinone 0.25 post cath. Co-ox 58% - Set up CVP monitoring to guide further diuresis  - Continue Entresto  24-26 mg BID - Continue spiro 25 mg daily - Add digoxin 0.125 mg daily  - No BB with acute decompensation - Plan LHC to r/o CAD, later today vs tomorrow (ate breakfast this morning)  - Not a candidate for heart tx at this time with ongoing tobacco and ETOH use. Motivated to quit. Need to consider advanced therapies (LVAD).    Atrial flutter - Currently in NSR.  - covering w/ heparin  gtt. Plan to restart Eliquis  after completion of invasive procedures    Possible mural thrombus - LV apical flow very sluggish on echo this admission - cMRI also concerning for possible LV mural thrombus - heparin  gtt>>eventual Eliquis  5 mg BID   Mod mixed pulmonary HTN - RHC pressures elevated as above on RHC this admission - Plan VQ scan to r/o CTEPH   HTN - BP stable.  - Continue Entresto .    CKD 3a - Baseline SCr ~1.3? - 1.36 today  - Follow with diuresis   Hx PE/DVT - 4/21: required IR bilateral thrombectomy complicated by embolic L renal infarct secondary to PFO  - heparin  gtt    NSVT - supp Mg - start amio gtt while on milrinone     Length of Stay: [address] PA-C  07/24/2024, 7:12 AM  Advanced Heart Failure Team Pager [phone] (M-F; 7a - 5p)  Please contact [facility] for night-coverage after hours (5p -7a ) and weekends on [email or web address]  Patient seen and examined with the above-signed Advanced Practice Provider and/or Housestaff. I personally reviewed laboratory data, imaging studies and relevant notes. I independently examined the patient and formulated the important aspects of the plan. I have edited the note to reflect any of my changes or salient points. I have personally discussed the plan with the patient  and/or family.  RHC yesterday with low output HF. Started on milrinone. cMRI suggestive of underlying iCM.   Feeling better. Co-ox marginal at 58%. Had some NSVT overnight.   Remains on heparin   General:  Sitting up in bed. No resp difficulty HEENT: normal Neck: supple. JVP 8-9 Cor: Regular rate & rhythm. No rubs, gallops or murmurs. Lungs: clear Abdomen: soft, nontender, nondistended.Good bowel sounds. Extremities: no cyanosis, clubbing, rash, edema Neuro: alert & orientedx3, cranial nerves grossly intact. moves all 4 extremities w/o difficulty. Affect pleasant  Will plan coronary angio today to assess for interim development of CAD (cath 2021 no CAD). If no CAD will need cPET to look for sarcoid. '  Continue milrinone for now. Adjust GDMT.   VQ prior to  d/c to look for residual PE burden.   Toribio Fuel, MD  9:04 AM

## 2024-07-24 NOTE — Assessment & Plan Note (Signed)
 Improved w/ medication - Continue Zoloft 25 mg QHS

## 2024-07-24 NOTE — Progress Notes (Signed)
 Mobility Specialist Progress Note:    07/24/24 1540  Mobility  Activity Stood at bedside;Dangled on edge of bed (Heel/toe Raises, Leg Ext, Standing Marches, Mini Squats)  Level of Assistance Standby assist, set-up cues, supervision of patient - no hands on  Assistive Device None  Range of Motion/Exercises Active;All extremities  Activity Response Tolerated well  Mobility Referral Yes  Mobility visit 1 Mobility  Mobility Specialist Start Time (ACUTE ONLY) 1540  Mobility Specialist Stop Time (ACUTE ONLY) 1556  Mobility Specialist Time Calculation (min) (ACUTE ONLY) 16 min   Received pt recently come back from procedure agreeable to session. No c/o any symptoms. Pt doing well performing movements. Pt stated they are feeling a lot better. Returned pt to bed w/ all needs met.   Cody Joyce Mobility Specialist Please Neurosurgeon or Rehab Office at [phone]

## 2024-07-24 NOTE — Progress Notes (Addendum)
 Advanced Heart Failure Rounding Note  Cardiologist: Jerel Balding, MD  Chief Complaint: acute on chronic systolic heart failure w/ low output    Patient Profile   Cody Joyce is a 63 y.o. male with chronic HFrEF, NicM, HTN, CKD 3a, Hx submassive PE/DVT. A flutter, COPD alcohol use disorder and former smoker. AHF team to see with A/C HFrEF>BiV HF.   Cardiac Studies:    Echo 11/14: EF <20%, LV apical slow very sluggish, LV with GHK, GIIIDD, RV mildly reduced, mildly elevated PASP, LA severely dilated, RA mildly dilated, mild MR   RHC 11/17: RA 13, PA 52/29 (39), PCW 26, Fick CO/CI 3.5/1.8, TD CO/CI 3.5/1.8, PAPi 1.8, PaSat 51%   cMRI 11/17: LVEF 19%, mod LV dilatation. Inferior wall is akinetic with LGE and possible mural thrombus. LGE in coronary distrubution. Findings worrisome for CAD vs sarcoid vs myocarditis. RVEF 27%.   Subjective:    Started on Milrinone 0.25 post RHC. Co-ox 58% today.  Reports good UOP yesterday. I/Os incomplete. Several unmeasured urinary occurrences. Wt down 3 lb. Has PICC but CVP not ordered.   Scr 1.61>>1.36 K 4.0 Mg 1.9   Runs of NSVT overnight, longest 14 beats.   Feels better. Breathing much improved. Denies CP. Sitting up eating breakfast.     Objective:   Weight Range: 69.2 kg Body mass index is 20.13 kg/m.   Vital Signs:   Temp:  [98.1 F (36.7 C)-98.9 F (37.2 C)] 98.2 F (36.8 C) (11/18 0400) Pulse Rate:  [78-102] 90 (11/18 0400) Resp:  [12-21] 20 (11/18 0400) BP: (100-127)/(65-99) 125/99 (11/18 0400) SpO2:  [86 %-97 %] 92 % (11/18 0400) Weight:  [69.2 kg] 69.2 kg (11/18 0500) Last BM Date : 07/22/24  Weight change: Filed Weights   07/22/24 0548 07/23/24 0555 07/24/24 0500  Weight: 69.5 kg 70.7 kg 69.2 kg    Intake/Output:   Intake/Output Summary (Last 24 hours) at 07/24/2024 9287 Last data filed at 07/23/2024 2355 Gross per 24 hour  Intake 490 ml  Output 1300 ml  Net -810 ml      Physical Exam     General:  Well appearing. No resp difficulty HEENT: Normal Neck: JVP 8 cm. Cor: PMI nondisplaced. Regular rate & rhythm. No rubs, gallops or murmurs. Lungs: diminished at the bases  Abdomen: Soft, nontender, nondistended. No hepatosplenomegaly. No bruits or masses. Good bowel sounds. Extremities: trace b/l LEE, RUE PICC Neuro: Alert & orientedx3, cranial nerves grossly intact. moves all 4 extremities w/o difficulty. Affect pleasant   Telemetry   NSR 80s, runs of NSVT, longest 14 beats   EKG    N/A   Labs    CBC Recent Labs    07/23/24 0804 07/23/24 1119 07/23/24 1120 07/24/24 0625  WBC 13.5*  --   --  10.7*  HGB 14.0   < > 14.3 14.7  HCT 39.2   < > 42.0 40.9  MCV 84.3  --   --  83.8  PLT 148*  --   --  210   < > = values in this interval not displayed.   Basic Metabolic Panel Recent Labs    88/83/74 0237 07/23/24 0322 07/23/24 1119 07/23/24 1120  NA 137 133* 140 139  K 4.2 4.6 4.5 4.6  CL 102 106  --   --   CO2 25 20*  --   --   GLUCOSE 118* 102*  --   --   BUN 22 22  --   --  CREATININE 1.61* 1.28*  --   --   CALCIUM 8.1* 8.1*  --   --   MG 2.0 1.8  --   --    Liver Function Tests Recent Labs    07/22/24 0237 07/23/24 0322  AST 36 23  ALT 83* 61*  ALKPHOS 82 71  BILITOT 1.2 1.2  PROT 6.2* 6.4*  ALBUMIN 2.0* 1.9*   No results for input(s): LIPASE, AMYLASE in the last 72 hours. Cardiac Enzymes No results for input(s): CKTOTAL, CKMB, CKMBINDEX, TROPONINI in the last 72 hours.  BNP: BNP (last 3 results) Recent Labs    07/19/24 1324  BNP >4,500.0*    ProBNP (last 3 results) No results for input(s): PROBNP in the last 8760 hours.   D-Dimer No results for input(s): DDIMER in the last 72 hours. Hemoglobin A1C No results for input(s): HGBA1C in the last 72 hours. Fasting Lipid Panel No results for input(s): CHOL, HDL, LDLCALC, TRIG, CHOLHDL, LDLDIRECT in the last 72 hours. Thyroid Function Tests Recent Labs     07/22/24 0237  TSH 2.304    Other results:   Imaging    MR CARDIAC MORPHOLOGY W WO CONTRAST Result Date: 07/23/2024 CLINICAL DATA:  NICM with worsening EF COMPARISON: Echo 07/20/24 EXAM: MR CARDIA MORPHOLOGY WITHOUT AND WITH CONTRAST; MR CARDIAC VELOCITY FLOW MAPPING TECHNIQUE: The patient was scanned on a 1.5 Tesla Siemens magnet. A dedicated cardiac coil was used. Functional imaging was done using TrueFisp sequences. 2,3, and 4 chamber views were done to assess for RWMA's. Modified Simpson's rule using a short axis stack was used to calculate an ejection fraction on a dedicated work [employer]. The patient received 10mL GADAVIST GADOBUTROL 1 MMOL/ML IV SOLN. After 10 minutes inversion recovery sequences were used to assess for infiltration and scar tissue. Phase contrast velocity encoded images obtained x 2. This examination is tailored for evaluation cardiac anatomy and function and provides very limited assessment of noncardiac structures, which are accordingly not evaluated during interpretation. If there is clinical concern for extracardiac pathology, further evaluation with CT imaging should be considered. FINDINGS: LEFT VENTRICLE: Left ventricular chamber size: Moderately dilated. Left ventricular wall thickness: Mild LVH. Maximal wall thickness 13 mm in the mid septal wall. Left ventricular systolic function: Severely reduced LVEF = 19% Severe global hypokinesis with akinesis of the inferior wall No myocardial edema, T2 = 50 msec Normal first pass perfusion. There is post contrast delayed myocardial enhancement: There is transmural delayed enhancement in the inferior wall at the base (nonviable). At the mid ventricle, there is at least 50% wall thickness delayed enhancement subendocardial and midmyocardial, likely nonviable segment, with concern for associated mural thrombus. While this LGE pattern follows a coronary distribution, if no significant CAD is found, consider  FDG PET for cardiac sarcoidosis evaluation. No LGE seen at the apex. Normal T1 myocardial nulling kinetics suggest against a diagnosis of cardiac amyloidosis. ECV = 33%, mildly elevated RIGHT VENTRICLE: Moderately dilated right ventricular chamber size. Normal right ventricular wall thickness. Moderately reduced right ventricular systolic function. RVEF = 27% Global hypokinesis. No post contrast delayed myocardial enhancement. ATRIA: Biatrial dilation. PERICARDIUM: Normal pericardium.  Trivial pericardial effusion. OTHER: Abnormalities of the bilateral lung parenchyma, see recent CT chest imaging 07/19/24. MEASUREMENTS: VALVES: Tricuspid aortic valve. Aortic valve regurgitation: Mild, regurgitant fraction 9% Pulmonary valve regurgitation: Performed but not reported due to quality Mitral valve regurgitation: Mild, regurgitant fraction 13% Tricuspid valve regurgitation: Performed but not reported due to quality Left ventricle: LV male  LV EF: 19% (normal 49-79%) Absolute volumes: LV EDV: (Normal 95-215 mL) LV ESV: (Normal 25-85 mL) LV SV: 53mL (Normal 61-145 mL) CO: 4.2L/min (Normal 3.4-7.8 L/min) Indexed volumes: CI: 2.2L/min/sq-m (Normal 1.8-4.2 L/min/sq-m) LV EDV: 136mL/sq-m (Normal 50-108 mL/sq-m) LV ESV: 148mL/sq-m (Normal 11-47 mL/sq-m) LV SV: 24mL/sq-m (Normal 33-72 mL/sq-m) Right ventricle: RV male RV EF:  27% (Normal 51-80%) Absolute volumes: RV EDV: (Normal 109-217 mL) RV ESV: (Normal 23-91 mL) RV SV: 67mL (Normal 71-141 mL) CO: 5.3L/min (Normal 2.8-8.8 L/min) Indexed volumes: CI: 2.8L/min/sq-m (Normal 1.7-4.2 L/min/sq-m) RV EDV: 141mL/sq-m (Normal 58-109 mL/sq-m) RV ESV: 32mL/sq-m (Normal 12-46 mL/sq-m) RV SV: 84mL/sq-m (Normal 38-71 mL/sq-m) IMPRESSION: 1. Severe left ventricular systolic dysfunction, LVEF 19%. Moderate LV dilation. There is transmural delayed enhancement in the inferior wall at the base (nonviable). At the mid ventricle, there is at least 50% wall thickness delayed  enhancement subendocardial and midmyocardial, likely nonviable segment, with concern for associated mural thrombus. Consider coronary evaluation. While this LGE pattern follows a coronary distribution, if no significant CAD is found, consider FDG PET for cardiac sarcoidosis evaluation. May also represent prior myocarditis, but is less likely given the LGE is subendocardial and midmyocardial at the mid level. There is also a small focus of LGE in the inferior RV insertion point, nonspecific but suggests pulmonary hypertension. 2. Moderate right ventricular systolic dysfunction, RVEF 27%. Moderate RV dilation. No delayed myocardial enhancement. Electronically Signed   By: Soyla Merck M.D.   On: 07/23/2024 13:44   MR CARDIAC VELOCITY FLOW MAP Result Date: 07/23/2024 CLINICAL DATA:  NICM with worsening EF COMPARISON: Echo 07/20/24 EXAM: MR CARDIA MORPHOLOGY WITHOUT AND WITH CONTRAST; MR CARDIAC VELOCITY FLOW MAPPING TECHNIQUE: The patient was scanned on a 1.5 Tesla Siemens magnet. A dedicated cardiac coil was used. Functional imaging was done using TrueFisp sequences. 2,3, and 4 chamber views were done to assess for RWMA's. Modified Simpson's rule using a short axis stack was used to calculate an ejection fraction on a dedicated work [employer]. The patient received 10mL GADAVIST GADOBUTROL 1 MMOL/ML IV SOLN. After 10 minutes inversion recovery sequences were used to assess for infiltration and scar tissue. Phase contrast velocity encoded images obtained x 2. This examination is tailored for evaluation cardiac anatomy and function and provides very limited assessment of noncardiac structures, which are accordingly not evaluated during interpretation. If there is clinical concern for extracardiac pathology, further evaluation with CT imaging should be considered. FINDINGS: LEFT VENTRICLE: Left ventricular chamber size: Moderately dilated. Left ventricular wall thickness: Mild LVH. Maximal wall  thickness 13 mm in the mid septal wall. Left ventricular systolic function: Severely reduced LVEF = 19% Severe global hypokinesis with akinesis of the inferior wall No myocardial edema, T2 = 50 msec Normal first pass perfusion. There is post contrast delayed myocardial enhancement: There is transmural delayed enhancement in the inferior wall at the base (nonviable). At the mid ventricle, there is at least 50% wall thickness delayed enhancement subendocardial and midmyocardial, likely nonviable segment, with concern for associated mural thrombus. While this LGE pattern follows a coronary distribution, if no significant CAD is found, consider FDG PET for cardiac sarcoidosis evaluation. No LGE seen at the apex. Normal T1 myocardial nulling kinetics suggest against a diagnosis of cardiac amyloidosis. ECV = 33%, mildly elevated RIGHT VENTRICLE: Moderately dilated right ventricular chamber size. Normal right ventricular wall thickness. Moderately reduced right ventricular systolic function. RVEF = 27% Global hypokinesis. No post contrast delayed myocardial enhancement. ATRIA: Biatrial dilation.  PERICARDIUM: Normal pericardium.  Trivial pericardial effusion. OTHER: Abnormalities of the bilateral lung parenchyma, see recent CT chest imaging 07/19/24. MEASUREMENTS: VALVES: Tricuspid aortic valve. Aortic valve regurgitation: Mild, regurgitant fraction 9% Pulmonary valve regurgitation: Performed but not reported due to quality Mitral valve regurgitation: Mild, regurgitant fraction 13% Tricuspid valve regurgitation: Performed but not reported due to quality Left ventricle: LV male LV EF: 19% (normal 49-79%) Absolute volumes: LV EDV: (Normal 95-215 mL) LV ESV: (Normal 25-85 mL) LV SV: 53mL (Normal 61-145 mL) CO: 4.2L/min (Normal 3.4-7.8 L/min) Indexed volumes: CI: 2.2L/min/sq-m (Normal 1.8-4.2 L/min/sq-m) LV EDV: 12mL/sq-m (Normal 50-108 mL/sq-m) LV ESV: 147mL/sq-m (Normal 11-47 mL/sq-m) LV SV: 90mL/sq-m (Normal  33-72 mL/sq-m) Right ventricle: RV male RV EF:  27% (Normal 51-80%) Absolute volumes: RV EDV: (Normal 109-217 mL) RV ESV: (Normal 23-91 mL) RV SV: 67mL (Normal 71-141 mL) CO: 5.3L/min (Normal 2.8-8.8 L/min) Indexed volumes: CI: 2.8L/min/sq-m (Normal 1.7-4.2 L/min/sq-m) RV EDV: 115mL/sq-m (Normal 58-109 mL/sq-m) RV ESV: 32mL/sq-m (Normal 12-46 mL/sq-m) RV SV: 56mL/sq-m (Normal 38-71 mL/sq-m) IMPRESSION: 1. Severe left ventricular systolic dysfunction, LVEF 19%. Moderate LV dilation. There is transmural delayed enhancement in the inferior wall at the base (nonviable). At the mid ventricle, there is at least 50% wall thickness delayed enhancement subendocardial and midmyocardial, likely nonviable segment, with concern for associated mural thrombus. Consider coronary evaluation. While this LGE pattern follows a coronary distribution, if no significant CAD is found, consider FDG PET for cardiac sarcoidosis evaluation. May also represent prior myocarditis, but is less likely given the LGE is subendocardial and midmyocardial at the mid level. There is also a small focus of LGE in the inferior RV insertion point, nonspecific but suggests pulmonary hypertension. 2. Moderate right ventricular systolic dysfunction, RVEF 27%. Moderate RV dilation. No delayed myocardial enhancement. Electronically Signed   By: Soyla Merck M.D.   On: 07/23/2024 13:44   MR CARDIAC VELOCITY FLOW MAP Result Date: 07/23/2024 CLINICAL DATA:  NICM with worsening EF COMPARISON: Echo 07/20/24 EXAM: MR CARDIA MORPHOLOGY WITHOUT AND WITH CONTRAST; MR CARDIAC VELOCITY FLOW MAPPING TECHNIQUE: The patient was scanned on a 1.5 Tesla Siemens magnet. A dedicated cardiac coil was used. Functional imaging was done using TrueFisp sequences. 2,3, and 4 chamber views were done to assess for RWMA's. Modified Simpson's rule using a short axis stack was used to calculate an ejection fraction on a dedicated work [employer]. The  patient received 10mL GADAVIST GADOBUTROL 1 MMOL/ML IV SOLN. After 10 minutes inversion recovery sequences were used to assess for infiltration and scar tissue. Phase contrast velocity encoded images obtained x 2. This examination is tailored for evaluation cardiac anatomy and function and provides very limited assessment of noncardiac structures, which are accordingly not evaluated during interpretation. If there is clinical concern for extracardiac pathology, further evaluation with CT imaging should be considered. FINDINGS: LEFT VENTRICLE: Left ventricular chamber size: Moderately dilated. Left ventricular wall thickness: Mild LVH. Maximal wall thickness 13 mm in the mid septal wall. Left ventricular systolic function: Severely reduced LVEF = 19% Severe global hypokinesis with akinesis of the inferior wall No myocardial edema, T2 = 50 msec Normal first pass perfusion. There is post contrast delayed myocardial enhancement: There is transmural delayed enhancement in the inferior wall at the base (nonviable). At the mid ventricle, there is at least 50% wall thickness delayed enhancement subendocardial and midmyocardial, likely nonviable segment, with concern for associated mural thrombus. While this LGE pattern follows a coronary distribution, if no significant CAD is found,  consider FDG PET for cardiac sarcoidosis evaluation. No LGE seen at the apex. Normal T1 myocardial nulling kinetics suggest against a diagnosis of cardiac amyloidosis. ECV = 33%, mildly elevated RIGHT VENTRICLE: Moderately dilated right ventricular chamber size. Normal right ventricular wall thickness. Moderately reduced right ventricular systolic function. RVEF = 27% Global hypokinesis. No post contrast delayed myocardial enhancement. ATRIA: Biatrial dilation. PERICARDIUM: Normal pericardium.  Trivial pericardial effusion. OTHER: Abnormalities of the bilateral lung parenchyma, see recent CT chest imaging 07/19/24. MEASUREMENTS: VALVES:  Tricuspid aortic valve. Aortic valve regurgitation: Mild, regurgitant fraction 9% Pulmonary valve regurgitation: Performed but not reported due to quality Mitral valve regurgitation: Mild, regurgitant fraction 13% Tricuspid valve regurgitation: Performed but not reported due to quality Left ventricle: LV male LV EF: 19% (normal 49-79%) Absolute volumes: LV EDV: (Normal 95-215 mL) LV ESV: (Normal 25-85 mL) LV SV: 53mL (Normal 61-145 mL) CO: 4.2L/min (Normal 3.4-7.8 L/min) Indexed volumes: CI: 2.2L/min/sq-m (Normal 1.8-4.2 L/min/sq-m) LV EDV: 143mL/sq-m (Normal 50-108 mL/sq-m) LV ESV: 122mL/sq-m (Normal 11-47 mL/sq-m) LV SV: 50mL/sq-m (Normal 33-72 mL/sq-m) Right ventricle: RV male RV EF:  27% (Normal 51-80%) Absolute volumes: RV EDV: (Normal 109-217 mL) RV ESV: (Normal 23-91 mL) RV SV: 67mL (Normal 71-141 mL) CO: 5.3L/min (Normal 2.8-8.8 L/min) Indexed volumes: CI: 2.8L/min/sq-m (Normal 1.7-4.2 L/min/sq-m) RV EDV: 192mL/sq-m (Normal 58-109 mL/sq-m) RV ESV: 73mL/sq-m (Normal 12-46 mL/sq-m) RV SV: 82mL/sq-m (Normal 38-71 mL/sq-m) IMPRESSION: 1. Severe left ventricular systolic dysfunction, LVEF 19%. Moderate LV dilation. There is transmural delayed enhancement in the inferior wall at the base (nonviable). At the mid ventricle, there is at least 50% wall thickness delayed enhancement subendocardial and midmyocardial, likely nonviable segment, with concern for associated mural thrombus. Consider coronary evaluation. While this LGE pattern follows a coronary distribution, if no significant CAD is found, consider FDG PET for cardiac sarcoidosis evaluation. May also represent prior myocarditis, but is less likely given the LGE is subendocardial and midmyocardial at the mid level. There is also a small focus of LGE in the inferior RV insertion point, nonspecific but suggests pulmonary hypertension. 2. Moderate right ventricular systolic dysfunction, RVEF 27%. Moderate RV dilation. No delayed  myocardial enhancement. Electronically Signed   By: Soyla Merck M.D.   On: 07/23/2024 13:44   US  EKG SITE RITE Result Date: 07/23/2024 If Site Rite image not attached, placement could not be confirmed due to current cardiac rhythm.  CARDIAC CATHETERIZATION Result Date: 07/23/2024 Findings: RA = 13 RV = 51/15 PA = 52/29 (39) PCW = 26 Fick cardiac output/index = 3.5/1.8 TD CO/CI = 3.5/1.8 PVR = 3.8 WU Ao sat = 91% PA sat = 51%, 52\3% PAPi = 1.8 Assessment: 1. Low output HF with biventricular involvement and volume overload 2. Moderate mixed pulmonary HTN Plan/Discussion: Start milrinone. Continue diuresis. Stop b-blocker. Await cMRI. May need VQ to reassess for residual CTEPH Toribio Fuel, MD 11:34 AM    Medications:     Scheduled Medications:  Chlorhexidine Gluconate Cloth  6 each Topical Daily   melatonin  3 mg Oral QHS   pantoprazole   40 mg Oral Daily   polyethylene glycol  17 g Oral Daily   sacubitril -valsartan   1 tablet Oral BID   senna-docusate  1 tablet Oral QHS   sertraline  25 mg Oral QHS   sodium chloride  flush  10-40 mL Intracatheter Q12H   spironolactone  25 mg Oral Daily    Infusions:  heparin  1,800 Units/hr (07/24/24 0350)   milrinone 0.25 mcg/kg/min (07/23/24 1227)    PRN  Medications: acetaminophen , sodium chloride  flush    Assessment/Plan   A/C HFrEF>>BiV HF - Echo 4/21: EF 35-40%, GHK, mod reduced RV - [facility] 4/21: normal coronaries. Hemodynamics relatively stable just mild pulmonary hypertension.  - NYHA IV on admission. Suspect exacerbation 2/2 ETOH abuse vs medication noncompliance. ? Coronary diseases vs Sarcoid - Echo this admit EF <20%, LV apical slow very sluggish, LV with GHK, GIIIDD, RV mildly reduced, mildly elevated PASP, LA severely dilated, RA mildly dilated, mild MR.  - RHC 11/17 RA 13, PA 52/29 (39), PCW 26, Fick CO/CI 3.5/1.8, TD CO/CI 3.5/1.8, PAPi 1.8, PaSat 51%.  - cMRI showed LVEF 19%, mod LV dilatation. Inferior wall is akinetic  with LGE and possible mural thrombus. LGE in coronary distrubution.  Findings worrisome for CAD vs sarcoid vs myocarditis. RVEF 27%.  - Started on milrinone 0.25 post cath. Co-ox 58% - Set up CVP monitoring to guide further diuresis  - Continue Entresto  24-26 mg BID - Continue spiro 25 mg daily - Add digoxin 0.125 mg daily  - No BB with acute decompensation - Plan LHC to r/o CAD, later today vs tomorrow (ate breakfast this morning)  - Not a candidate for heart tx at this time with ongoing tobacco and ETOH use. Motivated to quit. Need to consider advanced therapies (LVAD).    Atrial flutter - Currently in NSR.  - covering w/ heparin  gtt. Plan to restart Eliquis  after completion of invasive procedures    Possible mural thrombus - LV apical flow very sluggish on echo this admission - cMRI also concerning for possible LV mural thrombus - heparin  gtt>>eventual Eliquis  5 mg BID   Mod mixed pulmonary HTN - RHC pressures elevated as above on RHC this admission - Plan VQ scan to r/o CTEPH   HTN - BP stable.  - Continue Entresto .    CKD 3a - Baseline SCr ~1.3? - 1.36 today  - Follow with diuresis   Hx PE/DVT - 4/21: required IR bilateral thrombectomy complicated by embolic L renal infarct secondary to PFO  - heparin  gtt    NSVT - supp Mg - start amio gtt while on milrinone     Length of Stay: [address] PA-C  07/24/2024, 7:12 AM  Advanced Heart Failure Team Pager [phone] (M-F; 7a - 5p)  Please contact [facility] for night-coverage after hours (5p -7a ) and weekends on [email or web address]  Patient seen and examined with the above-signed Advanced Practice Provider and/or Housestaff. I personally reviewed laboratory data, imaging studies and relevant notes. I independently examined the patient and formulated the important aspects of the plan. I have edited the note to reflect any of my changes or salient points. I have personally discussed the plan with the patient  and/or family.  RHC yesterday with low output HF. Started on milrinone. cMRI suggestive of underlying iCM.   Feeling better. Co-ox marginal at 58%. Had some NSVT overnight.   Remains on heparin   General:  Sitting up in bed. No resp difficulty HEENT: normal Neck: supple. JVP 8-9 Cor: Regular rate & rhythm. No rubs, gallops or murmurs. Lungs: clear Abdomen: soft, nontender, nondistended.Good bowel sounds. Extremities: no cyanosis, clubbing, rash, edema Neuro: alert & orientedx3, cranial nerves grossly intact. moves all 4 extremities w/o difficulty. Affect pleasant  Will plan coronary angio today to assess for interim development of CAD (cath 2021 no CAD). If no CAD will need cPET to look for sarcoid. '  Continue milrinone for now. Adjust GDMT.   VQ prior to  d/c to look for residual PE burden.   Toribio Fuel, MD  9:04 AM

## 2024-07-24 NOTE — Assessment & Plan Note (Signed)
 Out 2.075 L yesterday 11/15. Weight today of 153 lbs, down ~ 4 lbs from yesterday.Stress cardiac MRI on 11/17 to evaluate for new ischemia in setting of reduced EF and RHC on 11/17 RHC which showed RA 13, PA 52/29 (39), PCW 26, Fick CO/CI 3.5/1.8, TD CO/CI 3.5/1.8, PAPi 1.8, PaSat 51%. He was started on IV milrinone. cMRI today showed LVEF 19%, mod LV dilatation. Inferior wall is akinetic with LGE and possible mural thrombus. LGE in coronary distrubution.  Findings worrisome for CAD vs sarcoid vs myocarditis. RVEF 27%.   - Appreciate Cardiology recommendations: - Plan on LHC today  - Continue Metoprolol  tartrate 12.5 mg BID while inpatient; return to home regimen of metoprolol  succinate 25 mg daily at discharge as possible - Continue spironolactone to 25 mg daily - Will need ICD  - Cont Entresto  24-26 mg BID (continue this dose per cards) - Strict I/O, daily weight  - Labs: AM CMP, Mag - PT signed off 11/15, no further PT needs; OT signed off 11/16, no further OT needs - TOC consulted re: medication affordability, substance use - Pharmacy queried re: cost of SGLT2 inhibitors

## 2024-07-24 NOTE — Plan of Care (Signed)
 FMTS Interim Progress Note  S: Patient was seen and evaluated at bedside post cath. Doing well, states he feels so much better. Laughing and speaking without difficulty with son at bedside. No complaints.   O: BP (!) 131/90 (BP Location: Left Arm)   Pulse 84   Temp (!) 97.4 F (36.3 C) (Oral)   Resp 18   Ht 6' 1 (1.854 m)   Wt 69.2 kg   SpO2 96%   BMI 20.13 kg/m   General: A&O, NAD CV: RRR, no m/r/g, 2+ radial pulses Respiratory: normal WOB on RA, CTAB GI: non-distended, non-tender, soft  A/P: - Continue plan as per day team note   Lupie Credit, DO 07/24/2024, 7:05 PM PGY-1, [facility] Service pager [phone]

## 2024-07-24 NOTE — Assessment & Plan Note (Signed)
 Elevated LFTs on admission; cards concerned for hepatic congestion in setting of heart failure. Liver US  without acute findings and with normal hepatic vasculature flow. Levels improving throughout admission. 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 Hep B Surface Ag negative, indicating either past exposure or current infection in window period.  - HCV RNA quant pending result - Hep B DNA PCR pending result - Continue to follow

## 2024-07-25 ENCOUNTER — Encounter (HOSPITAL_COMMUNITY): Payer: Self-pay | Admitting: Internal Medicine

## 2024-07-25 DIAGNOSIS — K769 Liver disease, unspecified: Secondary | ICD-10-CM

## 2024-07-25 LAB — HEPARIN LEVEL (UNFRACTIONATED): Heparin Unfractionated: 0.21 [IU]/mL — ABNORMAL LOW (ref 0.30–0.70)

## 2024-07-25 LAB — COOXEMETRY PANEL
Carboxyhemoglobin: 1.4 % (ref 0.5–1.5)
Methemoglobin: <0.7 % (ref 0.0–1.5)
O2 Saturation: 61.8 %
Total hemoglobin: 15.4 g/dL (ref 12.0–16.0)

## 2024-07-25 LAB — CBC
HCT: 39.9 % (ref 39.0–52.0)
Hemoglobin: 14.4 g/dL (ref 13.0–17.0)
MCH: 30.1 pg (ref 26.0–34.0)
MCHC: 36.1 g/dL — ABNORMAL HIGH (ref 30.0–36.0)
MCV: 83.5 fL (ref 80.0–100.0)
Platelets: 213 K/uL (ref 150–400)
RBC: 4.78 MIL/uL (ref 4.22–5.81)
RDW: 15.1 % (ref 11.5–15.5)
WBC: 6.8 K/uL (ref 4.0–10.5)
nRBC: 0 % (ref 0.0–0.2)

## 2024-07-25 LAB — COMPREHENSIVE METABOLIC PANEL WITH GFR
ALT: 40 U/L (ref 0–44)
AST: 22 U/L (ref 15–41)
Albumin: 1.8 g/dL — ABNORMAL LOW (ref 3.5–5.0)
Alkaline Phosphatase: 67 U/L (ref 38–126)
Anion gap: 11 (ref 5–15)
BUN: 22 mg/dL (ref 8–23)
CO2: 25 mmol/L (ref 22–32)
Calcium: 8.2 mg/dL — ABNORMAL LOW (ref 8.9–10.3)
Chloride: 102 mmol/L (ref 98–111)
Creatinine, Ser: 1.43 mg/dL — ABNORMAL HIGH (ref 0.61–1.24)
GFR, Estimated: 55 mL/min — ABNORMAL LOW (ref >60)
Glucose, Bld: 108 mg/dL — ABNORMAL HIGH (ref 70–99)
Potassium: 4.1 mmol/L (ref 3.5–5.1)
Sodium: 138 mmol/L (ref 135–145)
Total Bilirubin: 0.6 mg/dL (ref 0.0–1.2)
Total Protein: 6.2 g/dL — ABNORMAL LOW (ref 6.5–8.1)

## 2024-07-25 LAB — MAGNESIUM: Magnesium: 1.9 mg/dL (ref 1.7–2.4)

## 2024-07-25 MED ORDER — APIXABAN 5 MG PO TABS
5.0000 mg | ORAL_TABLET | Freq: Two times a day (BID) | ORAL | Status: DC
  Administered 2024-07-25 – 2024-07-27 (×5): 5 mg via ORAL
  Filled 2024-07-25 (×5): qty 1

## 2024-07-25 MED ORDER — MAGNESIUM SULFATE 2 GM/50ML IV SOLN
2.0000 g | Freq: Once | INTRAVENOUS | Status: AC
  Administered 2024-07-25: 2 g via INTRAVENOUS
  Filled 2024-07-25: qty 50

## 2024-07-25 MED ORDER — MAGNESIUM SULFATE IN D5W 1-5 GM/100ML-% IV SOLN
1.0000 g | Freq: Once | INTRAVENOUS | Status: AC
  Administered 2024-07-25: 1 g via INTRAVENOUS
  Filled 2024-07-25: qty 100

## 2024-07-25 MED ORDER — EMPAGLIFLOZIN 10 MG PO TABS
10.0000 mg | ORAL_TABLET | Freq: Every day | ORAL | Status: DC
  Administered 2024-07-25 – 2024-07-27 (×3): 10 mg via ORAL
  Filled 2024-07-25 (×3): qty 1

## 2024-07-25 NOTE — Assessment & Plan Note (Signed)
 Elevated LFTs on admission; cards concerned for hepatic congestion in setting of heart failure. Liver US  without acute findings and with normal hepatic vasculature flow. Levels improving throughout admission. 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 Hep B Surface Ag negative, indicating either past exposure or current infection in window period.  - HCV RNA quant pending result - Hep B DNA PCR pending result - Continue to follow

## 2024-07-25 NOTE — Progress Notes (Addendum)
 Advanced Heart Failure Rounding Note  Cardiologist: Jerel Balding, MD  Chief Complaint: acute on chronic systolic heart failure w/ low output   Patient Profile  Cody Joyce is a 63 y.o. male with chronic HFrEF, NicM, HTN, CKD 3a, Hx submassive PE/DVT. A flutter, COPD alcohol use disorder and former smoker. AHF team to see with A/C HFrEF>BiV HF.  Cardiac Studies:   Echo 11/14: EF <20%, LV apical slow very sluggish, LV with GHK, GIIIDD, RV mildly reduced, mildly elevated PASP, LA severely dilated, RA mildly dilated, mild MR   RHC 11/17: RA 13, PA 52/29 (39), PCW 26, Fick CO/CI 3.5/1.8, TD CO/CI 3.5/1.8, PAPi 1.8, PaSat 51%   cMRI 11/17: LVEF 19%, mod LV dilatation. Inferior wall is akinetic with LGE and possible mural thrombus. LGE in coronary distrubution. Findings worrisome for CAD vs sarcoid vs myocarditis. RVEF 27%.   LHC 07/24/24: no CAD Subjective:   Started on Milrinone 0.25 post RHC. Co-ox 62% today.  Weights seen inaccurate but does report standing weights.  CVP 4/5.   Scr 1.61>>1.36>1.43 K 4.1 Mg 1.9   NSVT improving, longest 8 beats  Feels great this morning. Sitting on EOB eating breakfast. Denies CP/SOB.  Objective:   Weight Range: 72.6 kg Body mass index is 21.12 kg/m.   Vital Signs:   Temp:  [97.4 F (36.3 C)-98.4 F (36.9 C)] 97.8 F (36.6 C) (11/19 0046) Pulse Rate:  [74-100] 88 (11/19 0046) Resp:  [13-29] 18 (11/19 0046) BP: (93-149)/(69-97) 99/70 (11/19 0046) SpO2:  [57 %-97 %] 93 % (11/19 0046) Weight:  [72.6 kg] 72.6 kg (11/19 0500) Last BM Date : 07/22/24  Weight change: Filed Weights   07/23/24 0555 07/24/24 0500 07/25/24 0500  Weight: 70.7 kg 69.2 kg 72.6 kg    Intake/Output:   Intake/Output Summary (Last 24 hours) at 07/25/2024 0715 Last data filed at 07/25/2024 0046 Gross per 24 hour  Intake 483 ml  Output 1050 ml  Net -567 ml      Physical Exam    General:  well appearing.  No respiratory difficulty Neck: JVD flat.   Cor: Regular rate & rhythm. No murmurs. Lungs: clear Extremities: no edema  Neuro: alert & oriented x 3. Affect pleasant.   Telemetry   NSR 80s, intt NSVT, longest 8 beats (Personally reviewed)    Labs    CBC Recent Labs    07/24/24 0625 07/25/24 0312  WBC 10.7* 6.8  HGB 14.7 14.4  HCT 40.9 39.9  MCV 83.8 83.5  PLT 210 213   Basic Metabolic Panel Recent Labs    88/81/74 0625 07/25/24 0312  NA 135 138  K 4.0 4.1  CL 98 102  CO2 24 25  GLUCOSE 152* 108*  BUN 24* 22  CREATININE 1.36* 1.43*  CALCIUM 8.5* 8.2*  MG 1.9 1.9   Liver Function Tests Recent Labs    07/24/24 0625 07/25/24 0312  AST 23 22  ALT 53* 40  ALKPHOS 77 67  BILITOT 0.9 0.6  PROT 7.0 6.2*  ALBUMIN 2.1* 1.8*   No results for input(s): LIPASE, AMYLASE in the last 72 hours. Cardiac Enzymes No results for input(s): CKTOTAL, CKMB, CKMBINDEX, TROPONINI in the last 72 hours.  BNP: BNP (last 3 results) Recent Labs    07/19/24 1324  BNP >4,500.0*    ProBNP (last 3 results) No results for input(s): PROBNP in the last 8760 hours.   D-Dimer No results for input(s): DDIMER in the last 72 hours. Hemoglobin A1C Recent Labs  07/24/24 0625  HGBA1C 5.5   Fasting Lipid Panel No results for input(s): CHOL, HDL, LDLCALC, TRIG, CHOLHDL, LDLDIRECT in the last 72 hours. Thyroid Function Tests No results for input(s): TSH, T4TOTAL, T3FREE, THYROIDAB in the last 72 hours.  Invalid input(s): FREET3   Other results:   Imaging    NM Pulmonary Perfusion Result Date: 07/25/2024 EXAM: NM Lung Perfusion Scan. CLINICAL HISTORY: Pulmonary hypertension, further testing. TECHNIQUE: Radiolabeled MAA was administered intravenously and planar images of the lungs were obtained in multiple projections. RADIOPHARMACEUTICAL: 3.9 millicurie TECHNETIUM TO 77M ALBUMIN  AGGREGATED COMPARISON: CT angio chest from 07/19/2024 and chest radiograph from 07/24/2024. FINDINGS:  PERFUSION: On the right lateral projection image, there is a large segmental perfusion defect corresponding to the right middle lobe. On the left lateral projection image, there is a medium-sized segmental perfusion defect corresponding to the lateral basal segment of the left lower lobe. IMPRESSION: 1. Large segmental perfusion defect in the right middle lobe and medium-sized segmental perfusion defect to the lateral basal segment of the left lower lobe. Although these findings are nonspecific they may reflect age indeterminate pulmonary emboli in the appropriate clinical setting. These findings may also be seen with obstructive pulmonary disease, as well as any space-occupying process such as asymmetric alveolar edema, atelectasis or pneumonia. 2. The urgent finding will be called to the ordering provider by the Professional Radiology Assistants (PRAs) and documented in the [facility] dashboard. . . Electronically signed by: Waddell Calk MD 07/25/2024 05:46 AM EST RP Workstation: [id number]   DG CHEST PORT 1 VIEW Result Date: 07/24/2024 EXAM: 1 VIEW(S) XRAY OF THE CHEST 07/24/2024 01:21:16 PM COMPARISON: 07/19/2024 CLINICAL HISTORY: HF (heart failure), systolic (HCC) FINDINGS: LINES, TUBES AND DEVICES: Right PICC line in place with tip overlying the superior cavoatrial junction. LUNGS AND PLEURA: Diffuse interstitial opacities, less pronounced than prior exam. Biapical pleural/pulmonary scarring. No pleural effusion. No pneumothorax. HEART AND MEDIASTINUM: Unchanged cardiomediastinal silhouette. BONES AND SOFT TISSUES: No acute osseous abnormality. IMPRESSION: 1. Diffuse interstitial opacities, decreased compared to prior exam. Electronically signed by: Morgane Naveau MD 07/24/2024 05:28 PM EST RP Workstation: [id number]   CARDIAC CATHETERIZATION Result Date: 07/24/2024 Normal coronaries. LVEDP 12 Will need outpatient cardiac PET to further evaluate for possible cardiac sarcoid versus previous myocarditis.  Mahreen Schewe, MD 10:39 AM    Medications:     Scheduled Medications:  Chlorhexidine  Gluconate Cloth  6 each Topical Daily   digoxin   0.125 mg Oral Daily   melatonin  3 mg Oral QHS   pantoprazole   40 mg Oral Daily   polyethylene glycol  17 g Oral Daily   sacubitril -valsartan   1 tablet Oral BID   senna-docusate  1 tablet Oral QHS   sertraline   25 mg Oral QHS   sodium chloride  flush  10-40 mL Intracatheter Q12H   sodium chloride  flush  3 mL Intravenous Q12H   spironolactone   25 mg Oral Daily    Infusions:  sodium chloride      amiodarone  30 mg/hr (07/24/24 2345)   heparin  2,000 Units/hr (07/25/24 9391)   magnesium  sulfate bolus IVPB 1 g (07/25/24 0618)   milrinone  0.25 mcg/kg/min (07/25/24 0438)    PRN Medications: sodium chloride , acetaminophen , sodium chloride  flush, sodium chloride  flush    Assessment/Plan   A/C HFrEF>>BiV HF - Echo 4/21: EF 35-40%, GHK, mod reduced RV - [facility] 4/21: normal coronaries. Hemodynamics relatively stable just mild pulmonary hypertension.  - NYHA IV on admission. Suspect exacerbation 2/2 ETOH abuse vs medication noncompliance. ? Coronary diseases vs Sarcoid -  Echo this admit EF <20%, LV apical slow very sluggish, LV with GHK, GIIIDD, RV mildly reduced, mildly elevated PASP, LA severely dilated, RA mildly dilated, mild MR.  - RHC 11/17 RA 13, PA 52/29 (39), PCW 26, Fick CO/CI 3.5/1.8, TD CO/CI 3.5/1.8, PAPi 1.8, PaSat 51%.  - cMRI showed LVEF 19%, mod LV dilatation. Inferior wall is akinetic with LGE and possible mural thrombus. LGE in coronary distrubution.  Findings worrisome for CAD vs sarcoid vs myocarditis. RVEF 27%.  - LHC 07/24/24: no CAD - Started on milrinone 0.25 post cath. Co-ox 62%. Will wean milrinone to 0.125.  - CVP 4/5. Hold further diuretics today.  - Continue Entresto  24-26 mg BID - Continue spiro 25 mg daily - Continue digoxin 0.125 mg daily  - Start Jardiance  10 mg daily - No BB with acute decompensation - Plan for OP  cardiac PET to r/o sarcoid vs previous myocarditis.   - Not a candidate for heart tx at this time with ongoing tobacco and ETOH use. Motivated to quit. Need to consider advanced therapies (LVAD).   Atrial flutter - Currently in NSR.  - covering w/ heparin  gtt. Restart Eliquis .    Possible mural thrombus - LV apical flow very sluggish on echo this admission - cMRI also concerning for possible LV mural thrombus - Stop heparin  gtt switch to Eliquis  5 mg BID   Mod mixed pulmonary HTN - RHC pressures elevated as above on RHC this admission - VQ scan with large segmental diffusion defect in RML and medium sized segmental perfusion defect in lateral basal LLL. Nonspecific but may reflect age indeterminate pulmonary emboli vs obstructive pulmonary disease.  - Eliquis  as above   HTN - BP stable.  - Continue Entresto .    CKD 3a - Baseline SCr ~1.3? - 1.43 today  - Follow with diuresis   Hx PE/DVT - 4/21: required IR bilateral thrombectomy complicated by embolic L renal infarct secondary to PFO  - Hep gtt > Eliquis    NSVT - supp Mg - Continue amio gtt while on milrinone    Length of Stay: 6  Beckey LITTIE Coe, NP  07/25/2024, 7:15 AM  Advanced Heart Failure Team Pager [phone] (M-F; 7a - 5p)  Please contact [facility] for night-coverage after hours (5p -7a ) and weekends on [email or web address]  Patient seen and examined with the above-signed Advanced Practice Provider and/or Housestaff. I personally reviewed laboratory data, imaging studies and relevant notes. I independently examined the patient and formulated the important aspects of the plan. I have edited the note to reflect any of my changes or salient points. I have personally discussed the plan with the patient and/or family.  Results of cath and cMRI reviewed with him. Feels much better on milrinone. Has diuresed well.   VQ with residual perfusion defects.   Co-ox 62% CVP 5  General:  Sitting up in bed. No resp  difficulty HEENT: normal Neck: supple. no JVD.  Cor: Regular rate & rhythm. No rubs, gallops or murmurs. Lungs: clear Abdomen: soft, nontender, nondistended.Good bowel sounds. Extremities: no cyanosis, clubbing, rash, edema Neuro: alert & orientedx3, cranial nerves grossly intact. moves all 4 extremities w/o difficulty. Affect pleasant  Predominantly NICM but cMRI suggests possible previous focal myocarditis or sarcoid in inferior wall (vs spasm). Wean milrinone to 0.125 anfd follow co-ox. Continue to titrate GDMT. Switch heparin  to eliquis . Consider adding sildeanfil prior to d/c.   Will need further outpatient cardiac PET and further w/u for CTEPH with possible pulmonary angios.  Toribio Fuel, MD  9:34 PM

## 2024-07-25 NOTE — Progress Notes (Signed)
 PHARMACY - ANTICOAGULATION  Pharmacy Consult for heparin  Indication: atrial fibrillation Brief A/P: Heparin  level subtherapeutic Increase Heparin  rate  No Known Allergies  Patient Measurements: Height: 6' 1 (185.4 cm) Weight: 69.2 kg (152 lb 8.9 oz) IBW/kg (Calculated) : 79.9 HEPARIN  DW (KG): 75.8  Vital Signs: Temp: 97.8 F (36.6 C) (11/19 0046) Temp Source: Oral (11/19 0046) BP: 99/70 (11/19 0046) Pulse Rate: 88 (11/19 0046)  Labs: Recent Labs    07/22/24 2019 07/23/24 0322 07/23/24 0804 07/23/24 1119 07/23/24 1120 07/23/24 1225 07/24/24 0625 07/24/24 2147 07/25/24 0312  HGB  --   --  14.0   < > 14.3  --  14.7  --  14.4  HCT  --   --  39.2   < > 42.0  --  40.9  --  39.9  PLT  --   --  148*  --   --   --  210  --  213  APTT 57* 48*  --   --   --   --   --  91*  --   HEPARINUNFRC  --  <0.10*  --   --   --  <0.10*  --   --  0.21*  CREATININE  --  1.28*  --   --   --   --  1.36*  --   --    < > = values in this interval not displayed.    Estimated Creatinine Clearance: 53.7 mL/min (A) (by C-G formula based on SCr of 1.36 mg/dL (H)).  Assessment: 63 y.o. male with h/o Afib and LV thrombus, Eliquis  on hold, for heparin   Goal of Therapy:  Heparin  level 0.3-0.7 units/mL Monitor platelets by anticoagulation protocol: Yes   Plan:  Increase Heparin  2000 units/hr  Cathlyn Arrant, PharmD, BCPS  07/25/2024  4:34 AM

## 2024-07-25 NOTE — Assessment & Plan Note (Signed)
 Out 2.075 L yesterday 11/15. Weight today of 153 lbs, down ~ 4 lbs from yesterday.Stress cardiac MRI on 11/17 to evaluate for new ischemia in setting of reduced EF and RHC on 11/17 RHC which showed RA 13, PA 52/29 (39), PCW 26, Fick CO/CI 3.5/1.8, TD CO/CI 3.5/1.8, PAPi 1.8, PaSat 51%. He was started on IV milrinone. cMRI today showed LVEF 19%, mod LV dilatation. Inferior wall is akinetic with LGE and possible mural thrombus. LGE in coronary distrubution.  Findings worrisome for CAD vs sarcoid vs myocarditis. RVEF 27%. LHC 07/24/24 no CAD - Appreciate Cardiology recommendations: - Started on milrinone 0.25 post cath. Co-ox 62%. Will wean milrinone to 0.125.  - CVP 4/5. Hold further diuretics today.  - Continue Entresto  24-26 mg BID - Continue spiro 25 mg daily - Continue digoxin 0.125 mg daily  - Start Jardiance  10 mg daily - No BB with acute decompensation - Plan for OP cardiac PET to r/o sarcoid vs previous myocarditis.   - Not a candidate for heart tx at this time with ongoing tobacco and ETOH use. Motivated to quit. Need to consider advanced therapies (LVAD).  - Continue Metoprolol  tartrate 12.5 mg BID while inpatient; return to home regimen of metoprolol  succinate 25 mg daily at discharge as possible - Continue spironolactone to 25 mg daily - Will need ICD  Possible mural thrombus - LV apical flow very sluggish on echo this admission - cMRI also concerning for possible LV mural thrombus - Stop heparin  gtt switch to Eliquis  5 mg BID - Cont Entresto  24-26 mg BID (continue this dose per cards) - Strict I/O, daily weight  - Labs: AM CMP, Mag - PT signed off 11/15, no further PT needs; OT signed off 11/16, no further OT needs - TOC consulted re: medication affordability, substance use - Pharmacy queried re: cost of SGLT2 inhibitors

## 2024-07-25 NOTE — Assessment & Plan Note (Signed)
 HTN: Entresto  24-26 PO BID Atrial flutter  Hx PE: Holding home Eliquis  for potential RHC; on heparin  currently Alcohol dependence: CIWAs unremarkable so far during admission.  Last drink 11/13 per patient. Hx of smoking : Nicotine patch was offered but pt refused GERD: Protonix  40 mg daily Trouble sleeping: Melatonin daily at bedtime CKD 3a : sCr continue to improve 1.28 this AM ( 1.61 >>1.39 yesterday). Baseline around 1.5. Normal BUN and GFR Depression : Continue to follow mood symptoms

## 2024-07-25 NOTE — Assessment & Plan Note (Signed)
 Improved w/ medication - Continue Zoloft 25 mg QHS

## 2024-07-25 NOTE — Progress Notes (Signed)
 Daily Progress Note Intern Pager: [phone]  Patient name: Cody Joyce Medical record number: [id number] Date of birth: May 13, 1961 Age: 63 y.o. Gender: male  Primary Care Provider: [facility] Consultants: None Code Status: Full  Pt Overview and Major Events to Date:  11/13: Admitted to FMTS 11/18 : RHC and Cardiac MRI 11/19 : LHC  Medical Decision Making:  Cody Joyce is a 63 y.o. male with PMH of HFrEF, CKD 3a, PE on Eliquis , COPD, Alcohol Use Disorder. Admitted for acute decompensated heart failure in setting of reduced med adherence 2/2 financial difficulties. Found to have significantly decreased EF on echo at admission, LVEF < 20%. Cards following. Planning for stress cardiac MRI with possible RHC following Assessment & Plan Acute on chronic systolic heart failure (HCC) Out 2.075 L yesterday 11/15. Weight today of 153 lbs, down ~ 4 lbs from yesterday.Stress cardiac MRI on 11/17 to evaluate for new ischemia in setting of reduced EF and RHC on 11/17 RHC which showed RA 13, PA 52/29 (39), PCW 26, Fick CO/CI 3.5/1.8, TD CO/CI 3.5/1.8, PAPi 1.8, PaSat 51%. He was started on IV milrinone. cMRI today showed LVEF 19%, mod LV dilatation. Inferior wall is akinetic with LGE and possible mural thrombus. LGE in coronary distrubution.  Findings worrisome for CAD vs sarcoid vs myocarditis. RVEF 27%. LHC 07/24/24 no CAD - Appreciate Cardiology recommendations: - Started on milrinone 0.25 post cath. Co-ox 62%. Will wean milrinone to 0.125.  - CVP 4/5. Hold further diuretics today.  - Continue Entresto  24-26 mg BID - Continue spiro 25 mg daily - Continue digoxin 0.125 mg daily  - Start Jardiance  10 mg daily - No BB with acute decompensation - Plan for OP cardiac PET to r/o sarcoid vs previous myocarditis.   - Not a candidate for heart tx at this time with ongoing tobacco and ETOH use. Motivated to quit. Need to consider advanced therapies (LVAD).  - Continue Metoprolol   tartrate 12.5 mg BID while inpatient; return to home regimen of metoprolol  succinate 25 mg daily at discharge as possible - Continue spironolactone to 25 mg daily - Will need ICD  Possible mural thrombus - LV apical flow very sluggish on echo this admission - cMRI also concerning for possible LV mural thrombus - Stop heparin  gtt switch to Eliquis  5 mg BID - Cont Entresto  24-26 mg BID (continue this dose per cards) - Strict I/O, daily weight  - Labs: AM CMP, Mag - PT signed off 11/15, no further PT needs; OT signed off 11/16, no further OT needs - TOC consulted re: medication affordability, substance use - Pharmacy queried re: cost of SGLT2 inhibitors Hepatocellular injury Elevated LFTs on admission; cards concerned for hepatic congestion in setting of heart failure. Liver US  without acute findings and with normal hepatic vasculature flow. Levels improving throughout admission. 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 Hep B Surface Ag negative, indicating either past exposure or current infection in window period.  - HCV RNA quant pending result - Hep B DNA PCR pending result - Continue to follow Anxiety Improved w/ medication - Continue Zoloft 25 mg QHS Chronic health problem HTN: Entresto  24-26 PO BID Atrial flutter  Hx PE: Holding home Eliquis  for potential RHC; on heparin  currently Alcohol dependence: CIWAs unremarkable so far during admission.  Last drink 11/13 per patient. Hx of smoking : Nicotine patch was offered but pt refused GERD: Protonix  40 mg daily Trouble sleeping: Melatonin daily at bedtime CKD 3a :  sCr continue to improve 1.28 this AM ( 1.61 >>1.39 yesterday). Baseline around 1.5. Normal BUN and GFR Depression : Continue to follow mood symptoms  FEN/GI: Heart healthy after LHC today  PPx: Heparin  gtt : transition to Eliquis  today per cardiology Dispo: Home pending continued cardiac workup and medication management.  Subjective:  States feeling ok  and feel like he is back to baseline. Denies chest pain or palpitation. OOB to bathroom w/ assistant. Off oxygen    Objective: Temp:  [97.4 F (36.3 C)-98.2 F (36.8 C)] 97.8 F (36.6 C) (11/19 0046) Pulse Rate:  [74-96] 88 (11/19 0046) Resp:  [13-29] 18 (11/19 0046) BP: (93-149)/(69-97) 99/70 (11/19 0046) SpO2:  [57 %-97 %] 93 % (11/19 0046) Weight:  [72.6 kg] 72.6 kg (11/19 0500) Physical Exam: General: Rest in bed, in no acute distress. Cardiovascular: Regular rate and rhythm Respiratory: No distress on RA Abdomen: Soft, nondistended, nontender. Last BM 07/22/24 Extremities: Skin warm, dry. No bilateral lower extremity edema. PsychBETHA Richard spirit, appropriate judgement   Laboratory: Most recent CBC Lab Results  Component Value Date   WBC 6.8 07/25/2024   HGB 14.4 07/25/2024   HCT 39.9 07/25/2024   MCV 83.5 07/25/2024   PLT 213 07/25/2024   Most recent BMP    Latest Ref Rng & Units 07/25/2024    3:12 AM  BMP  Glucose 70 - 99 mg/dL 891   BUN 8 - 23 mg/dL 22   Creatinine 9.38 - 1.24 mg/dL 8.56   Sodium 864 - 854 mmol/L 138   Potassium 3.5 - 5.1 mmol/L 4.1   Chloride 98 - 111 mmol/L 102   CO2 22 - 32 mmol/L 25   Calcium 8.9 - 10.3 mg/dL 8.2   Hepatic Function Panel     Component Value Date/Time   PROT 6.2 (L) 07/25/2024 0312   ALBUMIN 1.8 (L) 07/25/2024 0312   AST 22 07/25/2024 0312   ALT 40 07/25/2024 0312   ALKPHOS 67 07/25/2024 0312   BILITOT 0.6 07/25/2024 0312   BILIDIR 0.4 (H) 01/01/2020 0254   IBILI 0.6 01/01/2020 0254  Hep B Surface Ag: nonreactive  HCV Ab: reactive  Hep A IgM: nonreactive  Hep B C IgM: Reactive   Imaging/Diagnostic Tests: Liver US  11/15:  1. No acute findings. 2. Hepatic vasculature demonstrates normal flow in the normal direction.  Suzen Elder B, DO 07/25/2024, 9:15 AM  PGY-1, [facility] FPTS Intern pager: [phone], text pages welcome Secure chat group [facility] Teaching Service

## 2024-07-26 ENCOUNTER — Other Ambulatory Visit (HOSPITAL_COMMUNITY): Payer: Self-pay

## 2024-07-26 LAB — CBC
HCT: 42.7 % (ref 39.0–52.0)
Hemoglobin: 15.6 g/dL (ref 13.0–17.0)
MCH: 30.5 pg (ref 26.0–34.0)
MCHC: 36.5 g/dL — ABNORMAL HIGH (ref 30.0–36.0)
MCV: 83.6 fL (ref 80.0–100.0)
Platelets: 259 K/uL (ref 150–400)
RBC: 5.11 MIL/uL (ref 4.22–5.81)
RDW: 15.6 % — ABNORMAL HIGH (ref 11.5–15.5)
WBC: 6.7 K/uL (ref 4.0–10.5)
nRBC: 0 % (ref 0.0–0.2)

## 2024-07-26 LAB — COMPREHENSIVE METABOLIC PANEL WITH GFR
ALT: 37 U/L (ref 0–44)
AST: 24 U/L (ref 15–41)
Albumin: 1.9 g/dL — ABNORMAL LOW (ref 3.5–5.0)
Alkaline Phosphatase: 75 U/L (ref 38–126)
Anion gap: 9 (ref 5–15)
BUN: 19 mg/dL (ref 8–23)
CO2: 24 mmol/L (ref 22–32)
Calcium: 8.1 mg/dL — ABNORMAL LOW (ref 8.9–10.3)
Chloride: 101 mmol/L (ref 98–111)
Creatinine, Ser: 1.43 mg/dL — ABNORMAL HIGH (ref 0.61–1.24)
GFR, Estimated: 55 mL/min — ABNORMAL LOW (ref >60)
Glucose, Bld: 161 mg/dL — ABNORMAL HIGH (ref 70–99)
Potassium: 4.9 mmol/L (ref 3.5–5.1)
Sodium: 134 mmol/L — ABNORMAL LOW (ref 135–145)
Total Bilirubin: 0.8 mg/dL (ref 0.0–1.2)
Total Protein: 6.6 g/dL (ref 6.5–8.1)

## 2024-07-26 LAB — MAGNESIUM: Magnesium: 2.1 mg/dL (ref 1.7–2.4)

## 2024-07-26 LAB — COOXEMETRY PANEL
Carboxyhemoglobin: 2.1 % — ABNORMAL HIGH (ref 0.5–1.5)
Methemoglobin: 0.8 % (ref 0.0–1.5)
O2 Saturation: 68.1 %
Total hemoglobin: 15.7 g/dL (ref 12.0–16.0)

## 2024-07-26 MED ORDER — AMIODARONE HCL 200 MG PO TABS
200.0000 mg | ORAL_TABLET | Freq: Every day | ORAL | Status: DC
  Administered 2024-07-26 – 2024-07-27 (×2): 200 mg via ORAL
  Filled 2024-07-26 (×2): qty 1

## 2024-07-26 NOTE — Assessment & Plan Note (Signed)
 Improved w/ medication - Continue Zoloft 25 mg QHS

## 2024-07-26 NOTE — Assessment & Plan Note (Signed)
 HTN: Entresto  24-26 PO BID Atrial flutter  Hx PE: Holding home Eliquis  for potential RHC; on heparin  currently Alcohol dependence: CIWAs unremarkable so far during admission.  Last drink 11/13 per patient. Hx of smoking : Nicotine patch was offered but pt refused GERD: Protonix  40 mg daily Trouble sleeping: Melatonin daily at bedtime CKD 3a : sCr continue to improve 1.28 this AM ( 1.61 >>1.39 yesterday). Baseline around 1.5. Normal BUN and GFR Depression : Continue to follow mood symptoms

## 2024-07-26 NOTE — Progress Notes (Addendum)
 Advanced Heart Failure Rounding Note  Cardiologist: Jerel Balding, MD  Chief Complaint: acute on chronic systolic heart failure w/ low output   Patient Profile  Cody Joyce is a 63 y.o. male with chronic HFrEF, NicM, HTN, CKD 3a, Hx submassive PE/DVT. A flutter, COPD alcohol use disorder and former smoker. AHF team to see with A/C HFrEF>BiV HF.  Cardiac Studies:   Echo 11/14: EF <20%, LV apical slow very sluggish, LV with GHK, GIIIDD, RV mildly reduced, mildly elevated PASP, LA severely dilated, RA mildly dilated, mild MR   RHC 11/17: RA 13, PA 52/29 (39), PCW 26, Fick CO/CI 3.5/1.8, TD CO/CI 3.5/1.8, PAPi 1.8, PaSat 51%   cMRI 11/17: LVEF 19%, mod LV dilatation. Inferior wall is akinetic with LGE and possible mural thrombus. LGE in coronary distrubution. Findings worrisome for CAD vs sarcoid vs myocarditis. RVEF 27%.   LHC 07/24/24: no CAD Subjective:   Started on Milrinone  0.25 post RHC. Now on milrinone  0.125, co-ox 68 this morning.   Weights relatively stable, suspect yesterdays was incorrect  CVP 4/5.   Scr 1.61>>1.36>1.43> 1.43 K 4.9 Mg 2.1  Feels great this morning. Denies CP/SOB. Eager to go back to working out.  Objective:   Weight Range: 69.6 kg Body mass index is 20.24 kg/m.   Vital Signs:   Temp:  [97.4 F (36.3 C)-98.1 F (36.7 C)] 98.1 F (36.7 C) (11/20 0408) Pulse Rate:  [73-79] 79 (11/19 1700) Resp:  [18-20] 19 (11/20 0408) BP: (90-137)/(64-87) 90/64 (11/20 0408) SpO2:  [97 %-98 %] 98 % (11/19 2015) Weight:  [69.6 kg] 69.6 kg (11/20 0408) Last BM Date : 07/25/24  Weight change: Filed Weights   07/24/24 0500 07/25/24 0500 07/26/24 0408  Weight: 69.2 kg 72.6 kg 69.6 kg   Intake/Output:   Intake/Output Summary (Last 24 hours) at 07/26/2024 0713 Last data filed at 07/26/2024 0500 Gross per 24 hour  Intake 1547.69 ml  Output 1575 ml  Net -27.31 ml    Physical Exam    General:  well appearing.  No respiratory difficulty Neck: JVD flat.   Cor: Regular rate & rhythm. No murmurs. Lungs: clear Extremities: no edema  Neuro: alert & oriented x 3. Affect pleasant.   Telemetry   NSR 80s, minimal NSVT (Personally reviewed)    Labs    CBC Recent Labs    07/25/24 0312 07/26/24 0557  WBC 6.8 6.7  HGB 14.4 15.6  HCT 39.9 42.7  MCV 83.5 83.6  PLT 213 259   Basic Metabolic Panel Recent Labs    88/80/74 0312 07/26/24 0557  NA 138 134*  K 4.1 4.9  CL 102 101  CO2 25 24  GLUCOSE 108* 161*  BUN 22 19  CREATININE 1.43* 1.43*  CALCIUM 8.2* 8.1*  MG 1.9 2.1   Liver Function Tests Recent Labs    07/25/24 0312 07/26/24 0557  AST 22 24  ALT 40 37  ALKPHOS 67 75  BILITOT 0.6 0.8  PROT 6.2* 6.6  ALBUMIN  1.8* 1.9*   No results for input(s): LIPASE, AMYLASE in the last 72 hours. Cardiac Enzymes No results for input(s): CKTOTAL, CKMB, CKMBINDEX, TROPONINI in the last 72 hours.  BNP: BNP (last 3 results) Recent Labs    07/19/24 1324  BNP >4,500.0*    ProBNP (last 3 results) No results for input(s): PROBNP in the last 8760 hours.   D-Dimer No results for input(s): DDIMER in the last 72 hours. Hemoglobin A1C Recent Labs    07/24/24 [phone]  HGBA1C 5.5   Fasting Lipid Panel No results for input(s): CHOL, HDL, LDLCALC, TRIG, CHOLHDL, LDLDIRECT in the last 72 hours. Thyroid Function Tests No results for input(s): TSH, T4TOTAL, T3FREE, THYROIDAB in the last 72 hours.  Invalid input(s): FREET3   Other results:   Imaging   No results found.   Medications:   Scheduled Medications:  apixaban   5 mg Oral BID   Chlorhexidine Gluconate Cloth  6 each Topical Daily   digoxin  0.125 mg Oral Daily   empagliflozin   10 mg Oral Daily   melatonin  3 mg Oral QHS   pantoprazole   40 mg Oral Daily   polyethylene glycol  17 g Oral Daily   sacubitril -valsartan   1 tablet Oral BID   senna-docusate  1 tablet Oral QHS   sertraline  25 mg Oral QHS   sodium chloride  flush   10-40 mL Intracatheter Q12H   sodium chloride  flush  3 mL Intravenous Q12H   spironolactone  25 mg Oral Daily    Infusions:  amiodarone  30 mg/hr (07/25/24 2210)   milrinone 0.125 mcg/kg/min (07/25/24 1846)    PRN Medications: acetaminophen , sodium chloride  flush, sodium chloride  flush  Assessment/Plan   A/C HFrEF>>BiV HF - Echo 4/21: EF 35-40%, GHK, mod reduced RV - [facility] 4/21: normal coronaries. Hemodynamics relatively stable just mild pulmonary hypertension.  - NYHA IV on admission. Suspect exacerbation 2/2 ETOH abuse vs medication noncompliance. ? Coronary diseases vs Sarcoid - Echo this admit EF <20%, LV apical slow very sluggish, LV with GHK, GIIIDD, RV mildly reduced, mildly elevated PASP, LA severely dilated, RA mildly dilated, mild MR.  - RHC 11/17 RA 13, PA 52/29 (39), PCW 26, Fick CO/CI 3.5/1.8, TD CO/CI 3.5/1.8, PAPi 1.8, PaSat 51%.  - cMRI showed LVEF 19%, mod LV dilatation. Inferior wall is akinetic with LGE and possible mural thrombus. LGE in coronary distrubution.  Findings worrisome for CAD vs sarcoid vs myocarditis. RVEF 27%.  - LHC 07/24/24: no CAD - Started on milrinone 0.25 post cath. Co-ox 68%. Stop milrinone this morning.  - CVP 4/5. Does not need additional diuretics at this time.  - Continue Entresto  24-26 mg BID. BP too soft for further titration today.  - Continue spiro 25 mg daily - Continue digoxin 0.125 mg daily  - Continue Jardiance  10 mg daily - No BB with acute decompensation - Plan for OP cardiac PET to r/o sarcoid vs previous myocarditis.  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  - Not a candidate for heart tx at this time with ongoing tobacco and ETOH use. Motivated to quit. Need to consider advanced therapies (LVAD).   Atrial flutter - Currently in NSR.  - Continue Eliquis  5 mg BID.    Possible mural thrombus - LV apical flow very sluggish on echo this admission - cMRI also concerning for possible LV mural  thrombus - Continue Eliquis  5 mg BID   Mod mixed pulmonary HTN - RHC pressures elevated as above on RHC this admission - VQ scan with large segmental diffusion defect in RML and medium sized segmental perfusion defect in lateral basal LLL. Nonspecific but may reflect age indeterminate pulmonary emboli vs obstructive pulmonary disease.  - Eliquis  as above   HTN - BP stable.  - Continue Entresto .    CKD 3a - Baseline SCr ~1.3? - 1.43 today  - Follow with diuresis   Hx PE/DVT - 4/21: required IR bilateral thrombectomy complicated by embolic L renal infarct secondary to PFO  -  Continue Eliquis    NSVT - K and Mg stable - Stop amiodarone  gtt. Will start 200 mg daily. May be able to stop at discharge if rhythm remains stable.   Length of Stay: 7  Beckey LITTIE Coe, NP  07/26/2024, 7:13 AM  Advanced Heart Failure Team Pager [phone] (M-F; 7a - 5p)  Please contact [facility] for night-coverage after hours (5p -7a ) and weekends on [email or web address]  Patient seen and examined with the above-signed Advanced Practice Provider and/or Housestaff. I personally reviewed laboratory data, imaging studies and relevant notes. I independently examined the patient and formulated the important aspects of the plan. I have edited the note to reflect any of my changes or salient points. I have personally discussed the plan with the patient and/or family.  On milrinone 0.125. Co-ox and CVP look good. Feels good. On eliquis  no bleeding  General:  Sitting up in bed. No resp difficulty HEENT: normal Neck: supple. no JVD.  Cor: Regular rate & rhythm. No rubs, gallops or murmurs. Lungs: clear Abdomen: soft, nontender, nondistended.Good bowel sounds. Extremities: no cyanosis, clubbing, rash, edema Neuro: alert & orientedx3, cranial nerves grossly intact. moves all 4 extremities w/o difficulty. Affect pleasant  Stop milrinone today. Follow CVP/co-ox Titrate GDMT as possible. Continue Eliquis .   Toribio Fuel, MD  11:41 AM

## 2024-07-26 NOTE — Assessment & Plan Note (Signed)
 Out 2.075 L yesterday 11/15. Weight today of 153 lbs, down ~ 4 lbs from yesterday.Stress cardiac MRI on 11/17 to evaluate for new ischemia in setting of reduced EF and RHC on 11/17 RHC which showed RA 13, PA 52/29 (39), PCW 26, Fick CO/CI 3.5/1.8, TD CO/CI 3.5/1.8, PAPi 1.8, PaSat 51%. He was started on IV milrinone. cMRI today showed LVEF 19%, mod LV dilatation. Inferior wall is akinetic with LGE and possible mural thrombus. LGE in coronary distrubution.  Findings worrisome for CAD vs sarcoid vs myocarditis. RVEF 27%. LHC 07/24/24 no CAD - Appreciate Cardiology recommendations: - Stop Milrinone this morning - Stop Amiodarone  gtt. Will start 200 mg daily. May be able to stop at discharge if rhythm remains stable.  - Does not need additional diuretics at this time.  - Continue Entresto  24-26 mg BID further titration if become hypotensive - Continue spiro 25 mg daily - Continue digoxin 0.125 mg daily  - Start Jardiance  10 mg daily - No BB with acute decompensation - Plan for OP cardiac PET to r/o sarcoid vs previous myocarditis.   - Not a candidate for heart tx at this time with ongoing tobacco and ETOH use. Motivated to quit. Need to consider advanced therapies (LVAD).  - Continue Metoprolol  tartrate 12.5 mg BID while inpatient; return to home regimen of metoprolol  succinate 25 mg daily at discharge as possible - Continue spironolactone to 25 mg daily - Will need ICD  - Needs further workup OP for CTEPH with possible pulmonary angios.  Possible mural thrombus - LV apical flow very sluggish on echo this admission - cMRI also concerning for possible LV mural thrombus - Continue Eliquis  5 mg BID - Cont Entresto  24-26 mg BID (continue this dose per cards) - Strict I/O, daily weight  - Labs: AM CMP, Mag - PT signed off 11/15, no further PT needs; OT signed off 11/16, no further OT needs - TOC consulted re: medication affordability, substance use - Pharmacy queried re: cost of SGLT2 inhibitors

## 2024-07-26 NOTE — Assessment & Plan Note (Addendum)
 Elevated LFTs on admission; cards concerned for hepatic congestion in setting of heart failure. Liver US  without acute findings and with normal hepatic vasculature flow. Levels improving throughout admission. 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 Hep B Surface Ag negative, indicating either past exposure or current infection in window period.  - HCV RNA quant not detected  on 07/23/24 - Hep B DNA PCR not detected on 07/23/24 - Continue to follow

## 2024-07-26 NOTE — TOC Progression Note (Signed)
 Transition of Care [facility]) - Progression Note    Patient Details  Name: Cody Joyce MRN: [id number] Date of Birth: 12-10-1960  Transition of Care [facility]) CM/SW Contact  Arlana JINNY Nicholaus ISRA[place] Phone Number: [phone] 07/26/2024, 2:56 PM  Clinical Narrative:  Patient is not quite medically ready for dc. Will continue following.   HF CSW/CM will continue to follow and monitor for dc readiness.                        Expected Discharge Plan and Services                                               Social Drivers of Health (SDOH) Interventions SDOH Screenings   Food Insecurity: Food Insecurity Present (07/19/2024)  Housing: High Risk (07/19/2024)  Transportation Needs: Unmet Transportation Needs (07/19/2024)  Utilities: At Risk (07/19/2024)  Depression (PHQ2-9): Low Risk  (01/08/2019)  Tobacco Use: Medium Risk (07/24/2024)    Readmission Risk Interventions    07/20/2024    3:21 PM  Readmission Risk Prevention Plan  Medication Screening Complete  Transportation Screening Complete

## 2024-07-26 NOTE — Progress Notes (Addendum)
 Daily Progress Note Intern Pager: [phone]  Patient name: Cody Joyce Medical record number: [id number] Date of birth: [birth year] Age: 63 y.o. Gender: male  Primary Care Provider: [facility] Consultants: None Code Status: Full  Pt Overview and Major Events to Date:  11/13: Admitted to FMTS 11/18 : RHC and Cardiac MRI 11/19 : LHC  Medical Decision Making:  Cody Joyce is a 63 y.o. male with PMH of HFrEF, CKD 3a, PE on Eliquis , COPD, Alcohol Use Disorder. Admitted for acute decompensated heart failure in setting of reduced med adherence 2/2 financial difficulties. Found to have significantly decreased EF on echo at admission, LVEF < 20%. Cards following. Planning for stress cardiac MRI with possible RHC following Assessment & Plan Acute on chronic systolic heart failure (HCC) Out 2.075 L yesterday 11/15. Weight today of 153 lbs, down ~ 4 lbs from yesterday.Stress cardiac MRI on 11/17 to evaluate for new ischemia in setting of reduced EF and RHC on 11/17 RHC which showed RA 13, PA 52/29 (39), PCW 26, Fick CO/CI 3.5/1.8, TD CO/CI 3.5/1.8, PAPi 1.8, PaSat 51%. He was started on IV milrinone. cMRI today showed LVEF 19%, mod LV dilatation. Inferior wall is akinetic with LGE and possible mural thrombus. LGE in coronary distrubution.  Findings worrisome for CAD vs sarcoid vs myocarditis. RVEF 27%. LHC 07/24/24 no CAD - Appreciate Cardiology recommendations: - Stop Milrinone this morning - Stop Amiodarone  gtt. Will start 200 mg daily. May be able to stop at discharge if rhythm remains stable.  - Does not need additional diuretics at this time.  - Continue Entresto  24-26 mg BID further titration if become hypotensive - Continue spiro 25 mg daily - Continue digoxin 0.125 mg daily  - Start Jardiance  10 mg daily - No BB with acute decompensation - Plan for OP cardiac PET to r/o sarcoid vs previous myocarditis.   - Not a candidate for heart tx at this time with ongoing  tobacco and ETOH use. Motivated to quit. Need to consider advanced therapies (LVAD).  - Continue Metoprolol  tartrate 12.5 mg BID while inpatient; return to home regimen of metoprolol  succinate 25 mg daily at discharge as possible - Continue spironolactone to 25 mg daily - Will need ICD  - Needs further workup OP for CTEPH with possible pulmonary angios.  Possible mural thrombus - LV apical flow very sluggish on echo this admission - cMRI also concerning for possible LV mural thrombus - Continue Eliquis  5 mg BID - Cont Entresto  24-26 mg BID (continue this dose per cards) - Strict I/O, daily weight  - Labs: AM CMP, Mag - PT signed off 11/15, no further PT needs; OT signed off 11/16, no further OT needs - TOC consulted re: medication affordability, substance use - Pharmacy queried re: cost of SGLT2 inhibitors Hepatocellular injury Elevated LFTs on admission; cards concerned for hepatic congestion in setting of heart failure. Liver US  without acute findings and with normal hepatic vasculature flow. Levels improving throughout admission. 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 Hep B Surface Ag negative, indicating either past exposure or current infection in window period.  - HCV RNA quant not detected  on 07/23/24 - Hep B DNA PCR not detected on 07/23/24 - Continue to follow Anxiety Improved w/ medication - Continue Zoloft 25 mg QHS Chronic health problem HTN: Entresto  24-26 PO BID Atrial flutter  Hx PE: Holding home Eliquis  for potential RHC; on heparin  currently Alcohol dependence: CIWAs unremarkable so far during  admission.  Last drink 11/13 per patient. Hx of smoking : Nicotine patch was offered but pt refused GERD: Protonix  40 mg daily Trouble sleeping: Melatonin daily at bedtime CKD 3a : sCr continue to improve 1.28 this AM ( 1.61 >>1.39 yesterday). Baseline around 1.5. Normal BUN and GFR Depression : Continue to follow mood symptoms  FEN/GI: Heart healthy after  LHC today  PPx: Heparin  gtt : transition to Eliquis  today per cardiology Dispo: Home pending continued cardiac workup and medication management.  Subjective:  Doing well this morning. States feeling great and back to normal. OOB to bathroom   Objective: Temp:  [97.4 F (36.3 C)-98.1 F (36.7 C)] 97.4 F (36.3 C) (11/20 0720) Pulse Rate:  [74-79] 74 (11/20 0720) Resp:  [18-20] 18 (11/20 0720) BP: (90-137)/(64-87) 126/73 (11/20 0720) SpO2:  [97 %-98 %] 98 % (11/20 0720) Weight:  [69.6 kg] 69.6 kg (11/20 0408) Physical Exam: General: Non toxic. Cardiovascular: RRR Respiratory: No distress on RA Abdomen: Soft, nondistended, nontender. Last BM 07/26/24 Extremities: Skin warm, dry. No bilateral lower extremity edema. Psych: Appropriate emotion and judgement   Laboratory: Most recent CBC Lab Results  Component Value Date   WBC 6.7 07/26/2024   HGB 15.6 07/26/2024   HCT 42.7 07/26/2024   MCV 83.6 07/26/2024   PLT 259 07/26/2024   Most recent BMP    Latest Ref Rng & Units 07/26/2024    5:57 AM  BMP  Glucose 70 - 99 mg/dL 838   BUN 8 - 23 mg/dL 19   Creatinine 9.38 - 1.24 mg/dL 8.56   Sodium 864 - 854 mmol/L 134   Potassium 3.5 - 5.1 mmol/L 4.9   Chloride 98 - 111 mmol/L 101   CO2 22 - 32 mmol/L 24   Calcium 8.9 - 10.3 mg/dL 8.1   Hepatic Function Panel     Component Value Date/Time   PROT 6.6 07/26/2024 0557   ALBUMIN  1.9 (L) 07/26/2024 0557   AST 24 07/26/2024 0557   ALT 37 07/26/2024 0557   ALKPHOS 75 07/26/2024 0557   BILITOT 0.8 07/26/2024 0557   BILIDIR 0.4 (H) 01/01/2020 0254   IBILI 0.6 01/01/2020 0254  Hep B Surface Ag: nonreactive  HCV Ab: reactive  Hep A IgM: nonreactive  Hep B C IgM: Reactive   Imaging/Diagnostic Tests: Liver US  11/15:  1. No acute findings. 2. Hepatic vasculature demonstrates normal flow in the normal direction.  Suzen Houston NOVAK, DO 07/26/2024, 9:26 AM  PGY-1, [facility] FPTS Intern pager: [phone], text  pages welcome Secure chat group [facility] Teaching Service

## 2024-07-27 ENCOUNTER — Ambulatory Visit: Admitting: Cardiovascular Disease

## 2024-07-27 ENCOUNTER — Other Ambulatory Visit (HOSPITAL_COMMUNITY): Payer: Self-pay

## 2024-07-27 LAB — CBC
HCT: 43.6 % (ref 39.0–52.0)
Hemoglobin: 15.4 g/dL (ref 13.0–17.0)
MCH: 29.3 pg (ref 26.0–34.0)
MCHC: 35.3 g/dL (ref 30.0–36.0)
MCV: 83 fL (ref 80.0–100.0)
Platelets: 255 K/uL (ref 150–400)
RBC: 5.25 MIL/uL (ref 4.22–5.81)
RDW: 15.3 % (ref 11.5–15.5)
WBC: 7.9 K/uL (ref 4.0–10.5)
nRBC: 0.3 % — ABNORMAL HIGH (ref 0.0–0.2)

## 2024-07-27 LAB — COMPREHENSIVE METABOLIC PANEL WITH GFR
ALT: 35 U/L (ref 0–44)
AST: 20 U/L (ref 15–41)
Albumin: 2.1 g/dL — ABNORMAL LOW (ref 3.5–5.0)
Alkaline Phosphatase: 77 U/L (ref 38–126)
Anion gap: 7 (ref 5–15)
BUN: 20 mg/dL (ref 8–23)
CO2: 24 mmol/L (ref 22–32)
Calcium: 8.3 mg/dL — ABNORMAL LOW (ref 8.9–10.3)
Chloride: 104 mmol/L (ref 98–111)
Creatinine, Ser: 1.49 mg/dL — ABNORMAL HIGH (ref 0.61–1.24)
GFR, Estimated: 52 mL/min — ABNORMAL LOW (ref >60)
Glucose, Bld: 91 mg/dL (ref 70–99)
Potassium: 4.9 mmol/L (ref 3.5–5.1)
Sodium: 135 mmol/L (ref 135–145)
Total Bilirubin: 0.5 mg/dL (ref 0.0–1.2)
Total Protein: 6.9 g/dL (ref 6.5–8.1)

## 2024-07-27 LAB — MAGNESIUM: Magnesium: 2 mg/dL (ref 1.7–2.4)

## 2024-07-27 LAB — COOXEMETRY PANEL
Carboxyhemoglobin: 1.3 % (ref 0.5–1.5)
Methemoglobin: <0.7 % (ref 0.0–1.5)
O2 Saturation: 61.5 %
Total hemoglobin: 16.9 g/dL — ABNORMAL HIGH (ref 12.0–16.0)

## 2024-07-27 LAB — DIGOXIN LEVEL: Digoxin Level: <0.6 ng/mL — ABNORMAL LOW (ref 0.8–2.0)

## 2024-07-27 MED ORDER — FUROSEMIDE 20 MG PO TABS
20.0000 mg | ORAL_TABLET | ORAL | 0 refills | Status: DC | PRN
  Filled 2024-07-27: qty 30, fill #0

## 2024-07-27 MED ORDER — SENNOSIDES-DOCUSATE SODIUM 8.6-50 MG PO TABS: 1.0000 | ORAL_TABLET | Freq: Every day | ORAL | Status: DC

## 2024-07-27 MED ORDER — MELATONIN 3 MG PO TABS: 3.0000 mg | ORAL_TABLET | Freq: Every day | ORAL | Status: DC

## 2024-07-27 MED ORDER — EMPAGLIFLOZIN 10 MG PO TABS
10.0000 mg | ORAL_TABLET | Freq: Every day | ORAL | 0 refills | Status: DC
  Filled 2024-07-27 (×2): qty 30, 30d supply, fill #0

## 2024-07-27 MED ORDER — SACUBITRIL-VALSARTAN 24-26 MG PO TABS: 1.0000 | ORAL_TABLET | Freq: Two times a day (BID) | ORAL | 0 refills | Status: DC

## 2024-07-27 MED ORDER — POLYETHYLENE GLYCOL 3350 17 G PO PACK: 17.0000 g | PACK | Freq: Every day | ORAL | Status: DC

## 2024-07-27 MED ORDER — APIXABAN 5 MG PO TABS
5.0000 mg | ORAL_TABLET | Freq: Two times a day (BID) | ORAL | 0 refills | Status: DC
  Filled 2024-07-27: qty 60, 30d supply, fill #0

## 2024-07-27 MED ORDER — FUROSEMIDE 20 MG PO TABS: 20.0000 mg | ORAL_TABLET | ORAL | 0 refills | Status: DC | PRN

## 2024-07-27 MED ORDER — SACUBITRIL-VALSARTAN 24-26 MG PO TABS
1.0000 | ORAL_TABLET | Freq: Two times a day (BID) | ORAL | 0 refills | Status: DC
  Filled 2024-07-27: qty 60, 30d supply, fill #0

## 2024-07-27 MED ORDER — SERTRALINE HCL 25 MG PO TABS
25.0000 mg | ORAL_TABLET | Freq: Every day | ORAL | 0 refills | Status: DC
  Filled 2024-07-27: qty 30, 30d supply, fill #0

## 2024-07-27 MED ORDER — EMPAGLIFLOZIN 10 MG PO TABS
10.0000 mg | ORAL_TABLET | Freq: Every day | ORAL | 0 refills | Status: DC
  Filled 2024-07-27: qty 30, 30d supply, fill #0

## 2024-07-27 MED ORDER — EMPAGLIFLOZIN 10 MG PO TABS: 10.0000 mg | ORAL_TABLET | Freq: Every day | ORAL | 0 refills | Status: DC

## 2024-07-27 MED ORDER — SERTRALINE HCL 25 MG PO TABS: 25.0000 mg | ORAL_TABLET | Freq: Every day | ORAL | 0 refills | Status: AC

## 2024-07-27 MED ORDER — APIXABAN 5 MG PO TABS: 5.0000 mg | ORAL_TABLET | Freq: Two times a day (BID) | ORAL | 0 refills | Status: DC

## 2024-07-27 MED ORDER — DIGOXIN 125 MCG PO TABS: 0.1250 mg | ORAL_TABLET | Freq: Every day | ORAL | 0 refills | Status: DC

## 2024-07-27 MED ORDER — DIGOXIN 125 MCG PO TABS
0.1250 mg | ORAL_TABLET | Freq: Every day | ORAL | 0 refills | Status: DC
  Filled 2024-07-27: qty 30, 30d supply, fill #0

## 2024-07-27 MED ORDER — SPIRONOLACTONE 25 MG PO TABS
25.0000 mg | ORAL_TABLET | Freq: Every day | ORAL | 0 refills | Status: DC
  Filled 2024-07-27: qty 30, 30d supply, fill #0

## 2024-07-27 MED ORDER — SPIRONOLACTONE 25 MG PO TABS: 25.0000 mg | ORAL_TABLET | Freq: Every day | ORAL | 0 refills | Status: DC

## 2024-07-27 NOTE — Progress Notes (Addendum)
 Advanced Heart Failure Rounding Note  Cardiologist: Jerel Balding, MD  Chief Complaint: acute on chronic systolic heart failure w/ low output   Patient Profile  Cody Joyce is a 63 y.o. male with chronic HFrEF, NicM, HTN, CKD 3a, Hx submassive PE/DVT. A flutter, COPD alcohol use disorder and former smoker. AHF team to see with A/C HFrEF>BiV HF.  Cardiac Studies:   Echo 11/14: EF <20%, LV apical slow very sluggish, LV with GHK, GIIIDD, RV mildly reduced, mildly elevated PASP, LA severely dilated, RA mildly dilated, mild MR  RHC 11/17: RA 13, PA 52/29 (39), PCW 26, Fick CO/CI 3.5/1.8, TD CO/CI 3.5/1.8, PAPi 1.8, PaSat 51%  cMRI 11/17: LVEF 19%, mod LV dilatation. Inferior wall is akinetic with LGE and possible mural thrombus. LGE in coronary distrubution. Findings worrisome for CAD vs sarcoid vs myocarditis. RVEF 27%.  LHC 07/24/24: no CAD Subjective:   Started on Milrinone  0.25 post RHC. Now on milrinone  0.125, co-ox 68 this morning.   Weight down trending.  CVP 1-2.   Scr 1.61>>1.36>1.43> 1.43>1.49 K 4.9 Mg 2  Feels great this morning. Eager to go home hopefully.  Objective:   Weight Range: 68.3 kg Body mass index is 19.87 kg/m.   Vital Signs:   Temp:  [97.3 F (36.3 C)-98 F (36.7 C)] 97.4 F (36.3 C) (11/21 0447) Pulse Rate:  [71-80] 74 (11/21 0447) Resp:  [18-20] 19 (11/21 0447) BP: (105-143)/(66-87) 105/76 (11/21 0447) SpO2:  [96 %-98 %] 97 % (11/21 0447) Weight:  [68.3 kg] 68.3 kg (11/21 0447) Last BM Date : 07/26/24  Weight change: Filed Weights   07/25/24 0500 07/26/24 0408 07/27/24 0447  Weight: 72.6 kg 69.6 kg 68.3 kg   Intake/Output:   Intake/Output Summary (Last 24 hours) at 07/27/2024 0714 Last data filed at 07/27/2024 0601 Gross per 24 hour  Intake 560.34 ml  Output 2725 ml  Net -2164.66 ml    Physical Exam   General:  well appearing.  No respiratory difficulty Neck: JVD flat.  Cor: Regular rate & rhythm. No murmurs. Lungs:  clear Extremities: no edema  Neuro: alert & oriented x 3. Affect pleasant.   Telemetry   NSR 70s(Personally reviewed)    Labs    CBC Recent Labs    07/26/24 0557 07/27/24 0534  WBC 6.7 7.9  HGB 15.6 15.4  HCT 42.7 43.6  MCV 83.6 83.0  PLT 259 255   Basic Metabolic Panel Recent Labs    88/79/74 0557 07/27/24 0534  NA 134* 135  K 4.9 4.9  CL 101 104  CO2 24 24  GLUCOSE 161* 91  BUN 19 20  CREATININE 1.43* 1.49*  CALCIUM 8.1* 8.3*  MG 2.1 2.0   Liver Function Tests Recent Labs    07/26/24 0557 07/27/24 0534  AST 24 20  ALT 37 35  ALKPHOS 75 77  BILITOT 0.8 0.5  PROT 6.6 6.9  ALBUMIN  1.9* 2.1*   No results for input(s): LIPASE, AMYLASE in the last 72 hours. Cardiac Enzymes No results for input(s): CKTOTAL, CKMB, CKMBINDEX, TROPONINI in the last 72 hours.  BNP: BNP (last 3 results) Recent Labs    07/19/24 1324  BNP >4,500.0*    ProBNP (last 3 results) No results for input(s): PROBNP in the last 8760 hours.   D-Dimer No results for input(s): DDIMER in the last 72 hours. Hemoglobin A1C No results for input(s): HGBA1C in the last 72 hours.  Fasting Lipid Panel No results for input(s): CHOL, HDL, LDLCALC, TRIG,  CHOLHDL, LDLDIRECT in the last 72 hours. Thyroid Function Tests No results for input(s): TSH, T4TOTAL, T3FREE, THYROIDAB in the last 72 hours.  Invalid input(s): FREET3   Other results:   Imaging   No results found.   Medications:   Scheduled Medications:  amiodarone   200 mg Oral Daily   apixaban   5 mg Oral BID   Chlorhexidine  Gluconate Cloth  6 each Topical Daily   digoxin   0.125 mg Oral Daily   empagliflozin   10 mg Oral Daily   melatonin  3 mg Oral QHS   pantoprazole   40 mg Oral Daily   polyethylene glycol  17 g Oral Daily   sacubitril -valsartan   1 tablet Oral BID   senna-docusate  1 tablet Oral QHS   sertraline   25 mg Oral QHS   sodium chloride  flush  10-40 mL Intracatheter Q12H    sodium chloride  flush  3 mL Intravenous Q12H   spironolactone   25 mg Oral Daily    Infusions:    PRN Medications: acetaminophen , sodium chloride  flush, sodium chloride  flush  Assessment/Plan   A/C HFrEF>>BiV HF - Echo 4/21: EF 35-40%, GHK, mod reduced RV - [facility] 4/21: normal coronaries. Hemodynamics relatively stable just mild pulmonary hypertension.  - NYHA IV on admission. Suspect exacerbation 2/2 ETOH abuse vs medication noncompliance. ? Coronary diseases vs Sarcoid - Echo this admit EF <20%, LV apical slow very sluggish, LV with GHK, GIIIDD, RV mildly reduced, mildly elevated PASP, LA severely dilated, RA mildly dilated, mild MR.  - RHC 11/17 RA 13, PA 52/29 (39), PCW 26, Fick CO/CI 3.5/1.8, TD CO/CI 3.5/1.8, PAPi 1.8, PaSat 51%.  - cMRI showed LVEF 19%, mod LV dilatation. Inferior wall is akinetic with LGE and possible mural thrombus. LGE in coronary distrubution.  Findings worrisome for CAD vs sarcoid vs myocarditis. RVEF 27%.  - LHC 07/24/24: no CAD - Started on milrinone  0.25 post cath. Co-ox 62%. Milrinone  stopped 11/20.  - CVP 2. Does not need additional diuretics at this time.  - Continue Entresto  24-26 mg BID. BP too soft for further titration today.  - Continue spiro 25 mg daily - Continue digoxin  0.125 mg daily. Check digoxin  level today.  - Continue Jardiance  10 mg daily - No BB with acute decompensation, consider adding OP.  - Plan for OP cardiac PET to r/o sarcoid vs previous myocarditis.  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  - Not a candidate for heart tx at this time with ongoing tobacco and ETOH use. Motivated to quit. Need to consider advanced therapies (LVAD).   Atrial flutter - Currently in NSR.  - Continue Eliquis  5 mg BID.    Possible mural thrombus - LV apical flow very sluggish on echo this admission - cMRI also concerning for possible LV mural thrombus - Continue Eliquis  5 mg BID   Mod mixed pulmonary HTN - RHC  pressures elevated as above on RHC this admission - VQ scan with large segmental diffusion defect in RML and medium sized segmental perfusion defect in lateral basal LLL. Nonspecific but may reflect age indeterminate pulmonary emboli vs obstructive pulmonary disease.  - Eliquis  as above   HTN - BP stable.  - Continue Entresto .    CKD 3a - Baseline SCr ~1.3? - 1.49 today    Hx PE/DVT - 4/21: required IR bilateral thrombectomy complicated by embolic L renal infarct secondary to PFO  - Continue Eliquis    NSVT - K and Mg stable - Continue amiodarone  200 mg daily.  May be able to stop at initial AHF visit.   Appears stable for discharge from AHF standpoint pending MD eval. Will need meds sent to [facility]. Will arrange close follow up in AHF clinic.   AHF meds at discharge: Amiodarone  200 mg daily Eliquis  5 mg BID Spironolactone  25 mg daily Entresto  24-26 mg BID Digoxin  0.125 mcg daily Jardiance  10 mg daily  Length of Stay: 8  Beckey LITTIE Coe, NP  07/27/2024, 7:14 AM  Advanced Heart Failure Team Pager [phone] (M-F; 7a - 5p)  Please contact [facility] for night-coverage after hours (5p -7a ) and weekends on [email or web address]  Patient seen and examined with the above-signed Advanced Practice Provider and/or Housestaff. I personally reviewed laboratory data, imaging studies and relevant notes. I independently examined the patient and formulated the important aspects of the plan. I have edited the note to reflect any of my changes or salient points. I have personally discussed the plan with the patient and/or family.  Feels good. Off milrinone . Co-ox stable. CVP low  General:  Sitting up. No resp difficulty HEENT: normal Neck: supple. no JVD.  Cor: Regular rate & rhythm. No rubs, gallops or murmurs. Lungs: clear Abdomen: soft, nontender, nondistended.Good bowel sounds. Extremities: no cyanosis, clubbing, rash, edema Neuro: alert & orientedx3, cranial nerves grossly intact. moves all 4  extremities w/o difficulty. Affect pleasant  Ok for d/c today on current meds + prn lasix . Can stop amio   Will see next week in HF Clinic  Once LV function improves will need further w/u for CTEPH with repeat RHC and pulmonary angios  Toribio Fuel, MD  11:39 AM

## 2024-07-27 NOTE — Progress Notes (Signed)
 Nursing Discharge Note   Name: Cody Joyce MRN: [id number] DOB: February 19, 1961    Admit Date:  07/19/2024  Discharge Date:  07/27/2024   Cody Joyce is to be discharged home per MD order.  AVS completed. Reviewed with patient at bedside. Highlighted copy provided for patient to take home.  Patient able to verbalize understanding of discharge instructions. PIV removed. Patient stable upon discharge.     Discharge Instructions      Cody Joyce,  Thank you for letting us  participate in your care. You were hospitalized for CHF exacerbation and diagnosed with Acute on chronic systolic heart failure (HCC). You were treated with medications as follow.   POST-HOSPITAL & CARE INSTRUCTIONS Please take medications as prescribed Please f/u w/ outpatient cardiology Please [facility] with [facility] in 1 week Go to your follow up appointments (listed below)  DOCTOR'S APPOINTMENT   Future Appointments  Date Time Provider Department Center  08/06/2024 12:00 PM [facility] PA/NP [facility] None    Follow-up Information     [facility] Follow up on 08/06/2024.   Specialty: Cardiology Why: Follow up in the Advanced Heart Failure Clinic 08/06/24 at 12pm.   Entrance C, free valet Contact information: [address] [place]  [address] [phone]                Take care and be well!  Family Medicine Teaching Service Inpatient Team [place]  [facility]  [address] [place], [place] [address] [phone]                       Allergies as of 07/27/2024   No Known Allergies      Medication List     STOP taking these medications    metoprolol  succinate 50 MG 24 hr tablet Commonly known as: TOPROL -XL   sacubitril -valsartan  97-103 MG Commonly known as: ENTRESTO  Replaced by: sacubitril -valsartan  24-26 MG       TAKE these medications    acetaminophen   325 MG tablet Commonly known as: TYLENOL  Take 650 mg by mouth every 6 (six) hours as needed for moderate pain (pain score 4-6).   apixaban  5 MG Tabs tablet Commonly known as: ELIQUIS  Take 1 tablet (5 mg total) by mouth 2 (two) times daily. What changed: Another medication with the same name was removed. Continue taking this medication, and follow the directions you see here.   Cholecalciferol 50 MCG (2000 UT) Tabs Take 50 mcg by mouth.   diclofenac Sodium 1 % Gel Commonly known as: VOLTAREN Apply 4 g topically daily as needed (arthritic joint pain).   dicyclomine 20 MG tablet Commonly known as: BENTYL Take 20 mg by mouth 3 (three) times daily before meals.   digoxin  0.125 MG tablet Commonly known as: LANOXIN  Take 1 tablet (0.125 mg total) by mouth daily.   furosemide  20 MG tablet Commonly known as: LASIX  Take 1 tablet (20 mg total) by mouth as needed for edema or fluid.   Jardiance  10 MG Tabs tablet Generic drug: empagliflozin  Take 1 tablet (10 mg total) by mouth daily.   empagliflozin  10 MG Tabs tablet Commonly known as: JARDIANCE  Take 1 tablet (10 mg total) by mouth daily.   melatonin 3 MG Tabs tablet Take 1 tablet (3 mg total) by mouth at bedtime.   methocarbamol 750 MG tablet Commonly known as: ROBAXIN Take 750 mg by mouth 2 (two) times daily  as needed for muscle spasms.   omeprazole 20 MG capsule Commonly known as: PRILOSEC Take 20 mg by mouth daily.   polyethylene glycol 17 g packet Commonly known as: MIRALAX  / GLYCOLAX  Take 17 g by mouth daily.   sacubitril -valsartan  24-26 MG Commonly known as: ENTRESTO  Take 1 tablet by mouth 2 (two) times daily. Replaces: sacubitril -valsartan  97-103 MG   senna-docusate 8.6-50 MG tablet Commonly known as: Senokot-S Take 1 tablet by mouth at bedtime.   sertraline  25 MG tablet Commonly known as: ZOLOFT  Take 1 tablet (25 mg total) by mouth at bedtime.   spironolactone  25 MG tablet Commonly known as: ALDACTONE  Take  1 tablet (25 mg total) by mouth daily.         Discharge Instructions/ Education: An After Visit Summary was printed and given to the patient. Discharge instructions given to patient with verbalized understanding. Discharge education completed with patient/family including: follow up instructions, medication list, discharge activities, and limitations if indicated.  Patient able to verbalize understanding, all questions fully answered. Patient instructed to return to Emergency Department, call 911, or call MD for any changes in condition.   Patient escorted via wheelchair to lobby and discharged home via private automobile.

## 2024-07-27 NOTE — Progress Notes (Signed)
 Jardiance  prescription retrieved from [facility] and brought to patient's bedside for discharge home.  Printed prescriptions provided to patient for use at [facility] to obtain medications once discharged.

## 2024-07-27 NOTE — Progress Notes (Signed)
 RA TL PICC removed per protocol per MD order. Manual pressure applied for 4 mins. Vaseline gauze, gauze, and Tegaderm applied over insertion site. No bleeding or swelling noted. Instructed patient to remain in bed for thirty mins. Educated patient about S/S of infection and when to call MD; no heavy lifting or pressure on right side for 24 hours; keep dressing dry and intact for 24 hours. Pt verbalized comprehension. Notified unit RN.

## 2024-07-27 NOTE — Plan of Care (Signed)
 Discharging home with printed prescriptions.   Problem: Education: Goal: Knowledge of General Education information will improve Description: Including pain rating scale, medication(s)/side effects and non-pharmacologic comfort measures 07/27/2024 1604 by Niel Porter ORN, RN Outcome: Adequate for Discharge 07/27/2024 1604 by Niel Porter ORN, RN Outcome: Adequate for Discharge   Problem: Health Behavior/Discharge Planning: Goal: Ability to manage health-related needs will improve 07/27/2024 1604 by Niel Porter ORN, RN Outcome: Adequate for Discharge 07/27/2024 1604 by Niel Porter ORN, RN Outcome: Adequate for Discharge   Problem: Clinical Measurements: Goal: Ability to maintain clinical measurements within normal limits will improve 07/27/2024 1604 by Niel Porter ORN, RN Outcome: Adequate for Discharge 07/27/2024 1604 by Niel Porter ORN, RN Outcome: Adequate for Discharge Goal: Diagnostic test results will improve 07/27/2024 1604 by Niel Porter ORN, RN Outcome: Adequate for Discharge 07/27/2024 1604 by Niel Porter ORN, RN Outcome: Adequate for Discharge Goal: Respiratory complications will improve 07/27/2024 1604 by Niel Porter ORN, RN Outcome: Adequate for Discharge 07/27/2024 1604 by Niel Porter ORN, RN Outcome: Adequate for Discharge Goal: Cardiovascular complication will be avoided 07/27/2024 1604 by Niel Porter ORN, RN Outcome: Adequate for Discharge 07/27/2024 1604 by Niel Porter ORN, RN Outcome: Adequate for Discharge   Problem: Activity: Goal: Risk for activity intolerance will decrease 07/27/2024 1604 by Niel Porter ORN, RN Outcome: Adequate for Discharge 07/27/2024 1604 by Niel Porter ORN, RN Outcome: Adequate for Discharge   Problem: Acute Rehab PT Goals(only PT should resolve) Goal: Pt Will Ambulate Outcome: Adequate for Discharge   Problem: Education: Goal: Understanding of CV disease, CV risk reduction, and recovery process will improve 07/27/2024 1604  by Niel Porter ORN, RN Outcome: Adequate for Discharge 07/27/2024 1604 by Niel Porter ORN, RN Outcome: Adequate for Discharge Goal: Individualized Educational Video(s) 07/27/2024 1604 by Niel Porter ORN, RN Outcome: Adequate for Discharge 07/27/2024 1604 by Niel Porter ORN, RN Outcome: Adequate for Discharge   Problem: Activity: Goal: Ability to return to baseline activity level will improve 07/27/2024 1604 by Niel Porter ORN, RN Outcome: Adequate for Discharge 07/27/2024 1604 by Niel Porter ORN, RN Outcome: Adequate for Discharge   Problem: Cardiovascular: Goal: Ability to achieve and maintain adequate cardiovascular perfusion will improve 07/27/2024 1604 by Niel Porter ORN, RN Outcome: Adequate for Discharge 07/27/2024 1604 by Niel Porter ORN, RN Outcome: Adequate for Discharge Goal: Vascular access site(s) Level 0-1 will be maintained 07/27/2024 1604 by Niel Porter ORN, RN Outcome: Adequate for Discharge 07/27/2024 1604 by Niel Porter ORN, RN Outcome: Adequate for Discharge   Problem: Health Behavior/Discharge Planning: Goal: Ability to safely manage health-related needs after discharge will improve 07/27/2024 1604 by Niel Porter ORN, RN Outcome: Adequate for Discharge 07/27/2024 1604 by Niel Porter ORN, RN Outcome: Adequate for Discharge   Problem: Education: Goal: Understanding of CV disease, CV risk reduction, and recovery process will improve 07/27/2024 1604 by Niel Porter ORN, RN Outcome: Adequate for Discharge 07/27/2024 1604 by Niel Porter ORN, RN Outcome: Adequate for Discharge Goal: Individualized Educational Video(s) 07/27/2024 1604 by Niel Porter ORN, RN Outcome: Adequate for Discharge 07/27/2024 1604 by Niel Porter ORN, RN Outcome: Adequate for Discharge   Problem: Activity: Goal: Ability to return to baseline activity level will improve 07/27/2024 1604 by Niel Porter ORN, RN Outcome: Adequate for Discharge 07/27/2024 1604 by Niel Porter ORN,  RN Outcome: Adequate for Discharge   Problem: Cardiovascular: Goal: Ability to achieve and maintain adequate cardiovascular perfusion will improve 07/27/2024 1604 by Niel Porter ORN, RN Outcome: Adequate for Discharge 07/27/2024 1604 by Niel Porter  W, RN Outcome: Adequate for Discharge Goal: Vascular access site(s) Level 0-1 will be maintained 07/27/2024 1604 by Niel Porter ORN, RN Outcome: Adequate for Discharge 07/27/2024 1604 by Niel Porter ORN, RN Outcome: Adequate for Discharge   Problem: Health Behavior/Discharge Planning: Goal: Ability to safely manage health-related needs after discharge will improve 07/27/2024 1604 by Niel Porter ORN, RN Outcome: Adequate for Discharge 07/27/2024 1604 by Niel Porter ORN, RN Outcome: Adequate for Discharge

## 2024-07-27 NOTE — Plan of Care (Signed)
?  Problem: Education: ?Goal: Knowledge of General Education information will improve ?Description: Including pain rating scale, medication(s)/side effects and non-pharmacologic comfort measures ?Outcome: Progressing ?  ?Problem: Health Behavior/Discharge Planning: ?Goal: Ability to manage health-related needs will improve ?Outcome: Progressing ?  ?Problem: Activity: ?Goal: Ability to return to baseline activity level will improve ?Outcome: Progressing ?  ?

## 2024-07-27 NOTE — Progress Notes (Signed)
 Mobility Specialist Progress Note:    07/27/24 1037  Mobility  Activity Ambulated independently  Level of Assistance Independent after set-up  Assistive Device None  Distance Ambulated (ft) 500 ft  Range of Motion/Exercises Active  Activity Response Tolerated well  Mobility Referral Yes  Mobility visit 1 Mobility  Mobility Specialist Start Time (ACUTE ONLY) 1037  Mobility Specialist Stop Time (ACUTE ONLY) 1050  Mobility Specialist Time Calculation (min) (ACUTE ONLY) 13 min   Received pt laying in bed agreeable to session. No c/o any symptoms. Pt feeling a lot better moving and ambulating well. Returned pt to room w/ all needs met.   Cody Joyce Mobility Specialist Please Neurosurgeon or Rehab Office at [phone]

## 2024-07-30 NOTE — Discharge Summary (Signed)
 Family Medicine Teaching [facility] Discharge Summary  Patient name: Cody Joyce Medical record number: [id number] Date of birth: 12/13/1960 Age: 63 y.o. Gender: male Date of Admission: 07/19/2024  Date of Discharge: 07/30/24  Admitting Physician: Gladis Church, DO  Primary Care Provider: [facility] Consultants: Cardiology   Indication for Hospitalization: SOB  Discharge Diagnoses/Problem List:  Principal Problem for Admission:  Principal Problem:   Acute on chronic systolic heart failure [facility]) Active Problems:   Chronic health problem   Hepatocellular injury   History of depressive symptoms   Anxiety   Brief Hospital Course:  Cody Joyce is a 63 y.o.male with a history of Emphysema/COPD, Heterotopic ossification, Alcohol dependence, CHF, HTN, PE on Eliquis , DVT, A-flutter, and CKD3a  who was admitted to the [facility] Teaching Service at [facility] for CHF exacerbation. His hospital course is detailed below:  CHF Exacerbation  Patient was presenting with worsening SOB starting 2 days ago. Reports taking lasix  once daily for the last week d/t feeling of fluid overloaded. Per patient he was diagnosed w/ CHF and has been receiving care at the [facility]. In the ED Pertinent labs include BNP >45000 Trp 39. Both of CTA PE and CXR indicated pulmonary edema. Echo during admission with worsening LVEF <20 and global hypokinesis. Cardiology was consulted  cardiac MRI, which was done 11/17 and showed LVEF 19%, mod LV dilatation. Inferior wall is akinetic with LGE and possible mural thrombus. LGE in coronary distrubution. Findings worrisome for CAD vs sarcoid vs myocarditis. RVEF 27%.. RHC completed on 11/17 which also shows  RA 13, PA 52/29 (39), PCW 26, Fick CO/CI 3.5/1.8, TD CO/CI 3.5/1.8, PAPi 1.8, PaSat 51%. Patient also had LHC on the follow w/ no CAD. Cardiology recommendation at discharge including to continue Entresto  24-26 mg BID, spiro 25 mg daily, digoxin  0.125 mg  daily, Jardiance  10 mg daily. No BB with acute decompensation, consider adding OP. Plan for OP cardiac PET to r/o sarcoid vs previous myocarditis. He will also needs further workup OP for CTEPH with possible pulmonary angios.  Patient is not a candidate for heart tx at this time with ongoing tobacco and ETOH use. Motivated to quit. Need to consider advanced therapies (LVAD).   Hepatocellular injury LFTs elevated on admission and given cards concern for hepatic congestion in setting of heart failure, liver ultrasound was obtained which showed normal hepatic vasculature flow and no acute findings.  Liver enzymes improved throughout admission.  However, Hepatitis panel with reactive HCV Ab and Hep B C IgM; Hep B Surface Ag, HCV RNA quant, and Hep B DNA PCR were negative  Mod mixed pulmonary HTN RHC as above. His VQ scan with large segmental diffusion defect in RML and medium sized segmental perfusion defect in lateral basal LLL. Nonspecific but may reflect age indeterminate pulmonary emboli vs obstructive pulmonary disease. Will plan to continue home Eliquis  5 mg BID   Other chronic conditions were medically managed with home medications and formulary alternatives as necessary (CKD, HTN, Hx PE, Alcohol dependence, GERD)  PCP Follow-up Recommendations: F/u LFTs (elevated on admission: AST 133, ALT 141) Ensure compliance with current medications/GDMT Ensure follow-up with cards; possible needs ICD placed outpatient Will need outpatient cardiac PET to further evaluate for possible cardiac sarcoid versus previous myocarditis.     Results/Tests Pending at Time of Discharge:  Unresulted Labs (From admission, onward)    None        Disposition: Home   Discharge Condition: stable  Discharge Exam:  Vitals:   07/27/24 0841 07/27/24 1040  BP: 118/82   Pulse: 80 80  Resp: 17   Temp: 97.8 F (36.6 C)   SpO2: 100%    General: Non toxic. Cardiovascular: RRR Respiratory: No distress on  RA Abdomen: Soft, nondistended, nontender. Last BM 07/26/24 Extremities: Skin warm, dry. No bilateral lower extremity edema. Psych: Appropriate emotion and judgement  Exam By: Suzen Houston NOVAK, DO   Significant Procedures:   RHC 11/17 RA 13, PA 52/29 (39), PCW 26, Fick CO/CI 3.5/1.8, TD CO/CI 3.5/1.8, PAPi 1.8, PaSat 51%.   Significant Labs and Imaging:  No results for input(s): WBC, HGB, HCT, PLT in the last 48 hours. No results for input(s): NA, K, CL, CO2, GLUCOSE, BUN, CREATININE, CALCIUM, MG, PHOS, ALKPHOS, AST, ALT, ALBUMIN , PROTEIN in the last 48 hours.  CXR:  IMPRESSION: 1. Bilateral interstitial densities, most consistent with pulmonary edema.  CT A PE:  IMPRESSION: 1. No CT evidence of pulmonary artery embolus. 2. Cardiomegaly with findings of CHF and small bilateral pleural effusions. 3. Areas of ground-glass airspace densities throughout the lungs likely represent edema. Pneumonia is not excluded. 4. Mediastinal and hilar adenopathy, likely reactive. 5.  Emphysema (ICD10-J43.9).  US  ABD RUQ IMPRESSION: 1. No acute findings. 2. Hepatic vasculature demonstrates normal flow in the normal direction.  MRI Cardiac:  IMPRESSION: 1. Severe left ventricular systolic dysfunction, LVEF 19%. Moderate LV dilation. There is transmural delayed enhancement in the inferior wall at the base (nonviable). At the mid ventricle, there is at least 50% wall thickness delayed enhancement subendocardial and midmyocardial, likely nonviable segment, with concern for associated mural thrombus. Consider coronary evaluation. While this LGE pattern follows a coronary distribution, if no significant CAD is found, consider FDG PET for cardiac sarcoidosis evaluation. May also represent prior myocarditis, but is less likely given the LGE is subendocardial and midmyocardial at the mid level. There is also a small focus of LGE in the inferior RV insertion  point, nonspecific but suggests pulmonary hypertension.   2. Moderate right ventricular systolic dysfunction, RVEF 27%. Moderate RV dilation. No delayed myocardial enhancement.  CXR:  IMPRESSION: 1. Diffuse interstitial opacities, decreased compared to prior exam.  VQ Scan:  IMPRESSION: 1. Large segmental perfusion defect in the right middle lobe and medium-sized segmental perfusion defect to the lateral basal segment of the left lower lobe. Although these findings are nonspecific they may reflect age indeterminate pulmonary emboli in the appropriate clinical setting. These findings may also be seen with obstructive pulmonary disease, as well as any space-occupying process such as asymmetric alveolar edema, atelectasis or pneumonia. 2. The urgent finding will be called to the ordering provider by the Professional Radiology Assistants (PRAs) and documented in the [facility] dashboard. . .   Discharge Medications:  Allergies as of 07/27/2024   No Known Allergies      Medication List     STOP taking these medications    metoprolol  succinate 50 MG 24 hr tablet Commonly known as: TOPROL -XL   sacubitril -valsartan  97-103 MG Commonly known as: ENTRESTO  Replaced by: sacubitril -valsartan  24-26 MG       TAKE these medications    acetaminophen  325 MG tablet Commonly known as: TYLENOL  Take 650 mg by mouth every 6 (six) hours as needed for moderate pain (pain score 4-6).   apixaban  5 MG Tabs tablet Commonly known as: ELIQUIS  Take 1 tablet (5 mg total) by mouth 2 (two) times daily. What changed: Another medication with the same name was removed. Continue taking this medication, and  follow the directions you see here.   Cholecalciferol 50 MCG (2000 UT) Tabs Take 50 mcg by mouth.   diclofenac Sodium 1 % Gel Commonly known as: VOLTAREN Apply 4 g topically daily as needed (arthritic joint pain).   dicyclomine 20 MG tablet Commonly known as: BENTYL Take 20 mg by mouth 3  (three) times daily before meals.   digoxin  0.125 MG tablet Commonly known as: LANOXIN  Take 1 tablet (0.125 mg total) by mouth daily.   furosemide  20 MG tablet Commonly known as: LASIX  Take 1 tablet (20 mg total) by mouth as needed for edema or fluid.   Jardiance  10 MG Tabs tablet Generic drug: empagliflozin  Take 1 tablet (10 mg total) by mouth daily.   empagliflozin  10 MG Tabs tablet Commonly known as: JARDIANCE  Take 1 tablet (10 mg total) by mouth daily.   melatonin 3 MG Tabs tablet Take 1 tablet (3 mg total) by mouth at bedtime.   methocarbamol 750 MG tablet Commonly known as: ROBAXIN Take 750 mg by mouth 2 (two) times daily as needed for muscle spasms.   omeprazole 20 MG capsule Commonly known as: PRILOSEC Take 20 mg by mouth daily.   polyethylene glycol 17 g packet Commonly known as: MIRALAX  / GLYCOLAX  Take 17 g by mouth daily.   sacubitril -valsartan  24-26 MG Commonly known as: ENTRESTO  Take 1 tablet by mouth 2 (two) times daily. Replaces: sacubitril -valsartan  97-103 MG   senna-docusate 8.6-50 MG tablet Commonly known as: Senokot-S Take 1 tablet by mouth at bedtime.   sertraline  25 MG tablet Commonly known as: ZOLOFT  Take 1 tablet (25 mg total) by mouth at bedtime.   spironolactone  25 MG tablet Commonly known as: ALDACTONE  Take 1 tablet (25 mg total) by mouth daily.        Discharge Instructions: Please refer to Patient Instructions section of EMR for full details.  Patient was counseled important signs and symptoms that should prompt return to medical care, changes in medications, dietary instructions, activity restrictions, and follow up appointments.   Follow-Up Appointments:  Follow-up Information     [facility] Follow up on 08/06/2024.   Specialty: Cardiology Why: Follow up in the [facility] 08/06/24 at 12pm.   Entrance C, free valet Contact information: [address] [place]  [phone]        [facility] Follow up.   Why: Please follow up in a week. Contact information: [address] [facility] [place] [address] [phone]                 Cleotilde Perkins, DO 07/30/2024, 1:22 PM PGY-3, [facility]

## 2024-08-01 ENCOUNTER — Telehealth (HOSPITAL_COMMUNITY): Payer: Self-pay

## 2024-08-01 NOTE — Telephone Encounter (Signed)
 Called to confirm/remind patient of their appointment at the [facility] on 08/06/24 12:00.   Appointment:   [] Confirmed  [x] Left mess   [] No answer/No voice mail  [] VM Full/unable to leave message  [] Phone not in service  Patient reminded to bring all medications and/or complete list.  Confirmed patient has transportation. Gave directions, instructed to utilize valet parking.

## 2024-08-05 NOTE — Progress Notes (Signed)
 ADVANCED HF CLINIC CONSULT NOTE  Referring Physician: [facility] Primary Care: [facility] Primary Cardiologist: Jerel Balding, MD  Chief Complaint: BiV HF HPI: Cody Joyce is a 63 y.o.  male with chronic HFrEF, NicM, HTN, CKD 3a, hx submassive PE/DVT. A flutter, COPD alcohol use disorder and former smoker.   Admitted 11/25 with A/C HFrEF>BiV HF. Previously followed by Dr. Balding but ended up transferring care to cardiology through the [facility]. D/t financial issues he was taking his medicines every other day, if that. During this admission he reported being a heavy smoker prior to submassive PE, then he stopped for a while but over the last year he started smoking again about 2 black and mild's a day. Had been drinking for several years now. Buys a bottle of tequila every Friday and finishes it by Sunday then during the week he drinks 1-2 beers/day. Intermittent marijuana use. RHC showed RA 13, PA 52/29 (39), PCW 26, Fick CO/CI 3.5/1.8, TD CO/CI 3.5/1.8, PAPi 1.8, PaSat 51%. He was started on IV milrinone . cMRI: LVEF 19%, mod LV dilatation. Inferior wall is akinetic with LGE and possible mural thrombus. LGE in coronary distrubution.  Findings worrisome for CAD vs sarcoid vs myocarditis. RVEF 27%. AHF team consulted. LHC with no CAD. He diuresed well with IV lasix  and tolerated milrinone  wean very well. GDMT started.   Today he returns for post hospital follow up. Overall feeling great. Denies palpitations, CP, dizziness, edema, or PND/Orthopnea. No SOB. Appetite ok, watches what he eats. No fever or chills. Weight at home 68.5kg. Taking all medications except spiro (unable to be filled). Denies ETOH, tobacco or drug use. Walks and lifts weights to stay active.   Reports father with history of MI/CAD, no other significant family history.   Cardiac studies:  [facility] 4/21: normal coronaries. Hemodynamics relatively stable just mild pulmonary hypertension.  Echo 11/25: EF  <20%, LV apical slow very sluggish, LV with GHK, GIIIDD, RV mildly reduced, mildly elevated PASP, LA severely dilated, RA mildly dilated, mild MR  RHC 11/25: RA 13, PA 52/29 (39), PCW 26, Fick CO/CI 3.5/1.8, TD CO/CI 3.5/1.8, PAPi 1.8, PaSat 51%  cMRI 11/25: LVEF 19%, mod LV dilatation. Inferior wall is akinetic with LGE and possible mural thrombus. LGE in coronary distrubution. Findings worrisome for CAD vs sarcoid vs myocarditis. RVEF 27%.  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    PFO (patent foramen ovale) 01/03/2020   Pulmonary embolism (HCC) 12/29/2019   Renal infarct     Current Outpatient Medications  Medication Sig Dispense Refill   acetaminophen  (TYLENOL ) 325 MG tablet Take 650 mg by mouth every 6 (six) hours as needed for moderate pain (pain score  4-6).     apixaban  (ELIQUIS ) 5 MG TABS tablet Take 1 tablet (5 mg total) by mouth 2 (two) times daily. 60 tablet 0   Cholecalciferol 50 MCG (2000 UT) TABS Take 50 mcg by mouth.     diclofenac Sodium (VOLTAREN) 1 % GEL Apply 4 g topically daily as needed (arthritic joint pain).     dicyclomine (BENTYL) 20 MG  tablet Take 20 mg by mouth 3 (three) times daily before meals.     digoxin  (LANOXIN ) 0.125 MG tablet Take 1 tablet (0.125 mg total) by mouth daily. 30 tablet 0   empagliflozin  (JARDIANCE ) 10 MG TABS tablet Take 1 tablet (10 mg total) by mouth daily. 30 tablet 0   empagliflozin  (JARDIANCE ) 10 MG TABS tablet Take 1 tablet (10 mg total) by mouth daily. 30 tablet 0   furosemide  (LASIX ) 20 MG tablet Take 1 tablet (20 mg total) by mouth as needed for edema or fluid. 30 tablet 0   methocarbamol (ROBAXIN) 750 MG tablet Take 750 mg by mouth 2 (two) times daily as needed for muscle spasms.     omeprazole (PRILOSEC) 20 MG capsule Take 20 mg by mouth daily.     sacubitril -valsartan  (ENTRESTO ) 24-26 MG Take 1 tablet by mouth 2 (two) times daily. 60 tablet 0   sertraline  (ZOLOFT ) 25 MG tablet Take 1 tablet (25 mg total) by mouth at bedtime. 30 tablet 0   melatonin 3 MG TABS tablet Take 1 tablet (3 mg total) by mouth at bedtime. (Patient not taking: Reported on 08/06/2024)     polyethylene glycol (MIRALAX  / GLYCOLAX ) 17 g packet Take 17 g by mouth daily. (Patient not taking: Reported on 08/06/2024)     senna-docusate (SENOKOT-S) 8.6-50 MG tablet Take 1 tablet by mouth at bedtime. (Patient not taking: Reported on 08/06/2024)     spironolactone  (ALDACTONE ) 25 MG tablet Take 1 tablet (25 mg total) by mouth daily. (Patient not taking: Reported on 08/06/2024) 30 tablet 0   No current facility-administered medications for this encounter.   Facility-Administered Medications Ordered in Other Encounters  Medication Dose Route Frequency Provider Last Rate Last Admin   sodium chloride  flush (NS) 0.9 % injection 10 mL  10 mL Intravenous PRN Croitoru, Mihai, MD   20 mL at 05/28/20 1540    No Known Allergies    Social History   Socioeconomic History   Marital status: Married    Spouse name: Not on file   Number of children: 0   Years of education: Not on file   Highest education level: Not on file  Occupational  History   Occupation: self employed  Tobacco Use   Smoking status: Former    Current packs/day: 0.00    Types: Cigarettes    Quit date: 01/05/2006    Years since quitting: 18.5   Smokeless tobacco: Never  Vaping Use   Vaping status: Never Used  Substance and Sexual Activity   Alcohol use: Yes    Alcohol/week: 6.0 standard drinks of alcohol    Types: 2 Glasses of wine, 4 Cans of beer per week    Comment: 0-2 per day   Drug use: No   Sexual activity: Yes  Other Topics Concern   Not on file  Social History Narrative   Marital status:  Married x 3 years; not happily married.      Children:  None      Lives: alone.      Employment:  Medical Sales Representative Custodian work x 7 years; happy.  Tobacco: smoke cigars 2 per day.  Quit cigarettes in 2001; smoked x 15 years.      Alcohol:  Weekends 4 beers per week; 2 drinks per week.      Drugs:  None      Exercising:  Three days per week; active job.        Seatbelt:  100%      Guns:  None      Sexual activity: condoms; Gonorrhea in high school.  One partner in past year.  Last STD screening 2013.   Social Drivers of Corporate Investment Banker Strain: Not on file  Food Insecurity: Food Insecurity Present (07/19/2024)   Hunger Vital Sign    Worried About Running Out of Food in the Last Year: Sometimes true    Ran Out of Food in the Last Year: Sometimes true  Transportation Needs: Unmet Transportation Needs (07/19/2024)   PRAPARE - Administrator, Civil Service (Medical): Yes    Lack of Transportation (Non-Medical): No  Physical Activity: Not on file  Stress: Not on file  Social Connections: Not on file  Intimate Partner Violence: Not At Risk (07/19/2024)   Humiliation, Afraid, Rape, and Kick questionnaire    Fear of Current or Ex-Partner: No    Emotionally Abused: No    Physically Abused: No    Sexually Abused: No      Family History  Problem Relation Age of Onset   Glaucoma Mother    Heart disease Father         stents/CAD/AMI age 59s.   Glaucoma Father    Pancreatic cancer Sister    Ovarian cancer Cousin        maternal   Colon cancer Neg Hx    Colitis Neg Hx    Esophageal cancer Neg Hx    Rectal cancer Neg Hx    Stomach cancer Neg Hx     Vitals:   08/06/24 1332  BP: 126/60  Pulse: 62  SpO2: 98%  Weight: 72.5 kg (159 lb 12.8 oz)  Height: 6' 1 (1.854 m)    PHYSICAL EXAM: General:  well appearing.  No respiratory difficulty. Walked into clinic Neck: JVD flat.  Cor: Regular rate & rhythm. No murmurs. Lungs: clear Extremities: no edema  Neuro: alert & oriented x 3. Affect pleasant.   ECG: NSR 60s (Personally reviewed)    Wt Readings from Last 3 Encounters:  08/06/24 72.5 kg (159 lb 12.8 oz)  07/27/24 68.3 kg (150 lb 9.2 oz)  05/02/24 70.8 kg (156 lb)    ASSESSMENT & PLAN: C HFrEF>>BiV HF - Echo 4/21: EF 35-40%, GHK, mod reduced RV - [facility]: normal coronaries. Hemodynamics relatively stable just mild pulmonary hypertension.  - Suspect recent exacerbation 2/2 ETOH abuse vs medication noncompliance. ? Coronary diseases vs Sarcoid - Echo 11/25 EF <20%, LV apical slow very sluggish, LV with GHK, GIIIDD, RV mildly reduced, mildly elevated PASP, LA severely dilated, RA mildly dilated, mild MR.  - RHC 11/17 RA 13, PA 52/29 (39), PCW 26, Fick CO/CI 3.5/1.8, TD CO/CI 3.5/1.8, PAPi 1.8, PaSat 51%.  - cMRI showed LVEF 19%, mod LV dilatation. Inferior wall is akinetic with LGE and possible mural thrombus. LGE in coronary distrubution.  Findings worrisome for CAD vs sarcoid vs myocarditis. RVEF 27%.  - LHC 07/24/24: no CAD - NYHA I-II - Euvolemic on exam, does not appear to need additional diuretic at this time.  - Continue Entresto  24-26 mg BID. Plan to uptitrate at  f/u if BP permits.  - Start spiro 25 mg daily. Unsure why it was unable to be filled at [facility] pharmacy at discharge, will resend today. - Continue digoxin  0.125 mg daily. Digoxin  <0.6 07/27/24.  - Continue Jardiance  10 mg  daily - Plan to add BB at upcoming visit.  - Will order cardiac PET to r/o sarcoid vs previous myocarditis.  - Once LV function improves will need further w/u for CTEPH with repeat RHC and pulmonary angios  - Not a candidate for heart tx at this time with recent tobacco and ETOH use. Motivated to quit, had not smoked or drank anything since admission. Need to consider advanced therapies (LVAD).  - Will need referral to EP if EF persistently <35%.  - Echo in 2-3 months - All AHF meds refilled today.    Atrial flutter - Currently in NSR.  - Continue Eliquis  5 mg BID.    Possible mural thrombus - LV apical flow very sluggish on echo 11/25 - cMRI also concerning for possible LV mural thrombus - Continue Eliquis  5 mg BID. Denies abnormal bleeding. CBC today.    Mod mixed pulmonary HTN - RHC pressures elevated as above on RHC this admission - VQ scan with large segmental diffusion defect in RML and medium sized segmental perfusion defect in lateral basal LLL. Nonspecific but may reflect age indeterminate pulmonary emboli vs obstructive pulmonary disease.  - Eliquis  as above   HTN - BP stable.  - Continue Entresto .    CKD 3a - Baseline SCr ~1.3? - Last 1.49 07/27/24 - BMET today    Hx PE/DVT - 4/21: required IR bilateral thrombectomy complicated by embolic L renal infarct secondary to PFO  - Continue Eliquis    NSVT - during 11/25 admission - No extopy on EKG today - Stable off amiodarone  (stopped at discharge)  HFSW to see with transportation issues.   Follow up in 2-3 weeks with APP for further GDMT titration. 6 weeks with PharmD. 8-10 weeks with Dr. Cherrie + echo.   Beckey LITTIE Coe AGACNP-BC  [facility] 08/06/24

## 2024-08-06 ENCOUNTER — Ambulatory Visit (HOSPITAL_COMMUNITY): Payer: Self-pay | Admitting: Internal Medicine

## 2024-08-06 ENCOUNTER — Ambulatory Visit (HOSPITAL_COMMUNITY): Admit: 2024-08-06 | Discharge: 2024-08-06 | Disposition: A | Attending: Internal Medicine

## 2024-08-06 ENCOUNTER — Other Ambulatory Visit (HOSPITAL_COMMUNITY): Payer: Self-pay

## 2024-08-06 ENCOUNTER — Encounter (HOSPITAL_COMMUNITY): Payer: Self-pay

## 2024-08-06 VITALS — BP 126/60 | HR 62 | Ht 73.0 in | Wt 159.8 lb

## 2024-08-06 DIAGNOSIS — Z59869 Financial insecurity, unspecified: Secondary | ICD-10-CM | POA: Insufficient documentation

## 2024-08-06 DIAGNOSIS — I272 Pulmonary hypertension, unspecified: Secondary | ICD-10-CM | POA: Insufficient documentation

## 2024-08-06 DIAGNOSIS — Z86718 Personal history of other venous thrombosis and embolism: Secondary | ICD-10-CM | POA: Insufficient documentation

## 2024-08-06 DIAGNOSIS — I4729 Other ventricular tachycardia: Secondary | ICD-10-CM

## 2024-08-06 DIAGNOSIS — I10 Essential (primary) hypertension: Secondary | ICD-10-CM

## 2024-08-06 DIAGNOSIS — F1010 Alcohol abuse, uncomplicated: Secondary | ICD-10-CM | POA: Insufficient documentation

## 2024-08-06 DIAGNOSIS — Q2112 Patent foramen ovale: Secondary | ICD-10-CM | POA: Insufficient documentation

## 2024-08-06 DIAGNOSIS — N280 Ischemia and infarction of kidney: Secondary | ICD-10-CM | POA: Insufficient documentation

## 2024-08-06 DIAGNOSIS — Z5982 Transportation insecurity: Secondary | ICD-10-CM | POA: Insufficient documentation

## 2024-08-06 DIAGNOSIS — R9431 Abnormal electrocardiogram [ECG] [EKG]: Secondary | ICD-10-CM | POA: Insufficient documentation

## 2024-08-06 DIAGNOSIS — Z7901 Long term (current) use of anticoagulants: Secondary | ICD-10-CM | POA: Insufficient documentation

## 2024-08-06 DIAGNOSIS — Z86711 Personal history of pulmonary embolism: Secondary | ICD-10-CM | POA: Insufficient documentation

## 2024-08-06 DIAGNOSIS — Z7984 Long term (current) use of oral hypoglycemic drugs: Secondary | ICD-10-CM | POA: Insufficient documentation

## 2024-08-06 DIAGNOSIS — I4892 Unspecified atrial flutter: Secondary | ICD-10-CM | POA: Insufficient documentation

## 2024-08-06 DIAGNOSIS — I5022 Chronic systolic (congestive) heart failure: Secondary | ICD-10-CM

## 2024-08-06 DIAGNOSIS — I472 Ventricular tachycardia, unspecified: Secondary | ICD-10-CM | POA: Insufficient documentation

## 2024-08-06 DIAGNOSIS — N1831 Chronic kidney disease, stage 3a: Secondary | ICD-10-CM | POA: Insufficient documentation

## 2024-08-06 DIAGNOSIS — I428 Other cardiomyopathies: Secondary | ICD-10-CM | POA: Insufficient documentation

## 2024-08-06 DIAGNOSIS — A549 Gonococcal infection, unspecified: Secondary | ICD-10-CM | POA: Insufficient documentation

## 2024-08-06 DIAGNOSIS — F1290 Cannabis use, unspecified, uncomplicated: Secondary | ICD-10-CM | POA: Insufficient documentation

## 2024-08-06 DIAGNOSIS — J449 Chronic obstructive pulmonary disease, unspecified: Secondary | ICD-10-CM | POA: Insufficient documentation

## 2024-08-06 DIAGNOSIS — I513 Intracardiac thrombosis, not elsewhere classified: Secondary | ICD-10-CM

## 2024-08-06 DIAGNOSIS — Z79899 Other long term (current) drug therapy: Secondary | ICD-10-CM | POA: Insufficient documentation

## 2024-08-06 DIAGNOSIS — I110 Hypertensive heart disease with heart failure: Secondary | ICD-10-CM | POA: Insufficient documentation

## 2024-08-06 DIAGNOSIS — I5082 Biventricular heart failure: Secondary | ICD-10-CM | POA: Insufficient documentation

## 2024-08-06 DIAGNOSIS — Z87891 Personal history of nicotine dependence: Secondary | ICD-10-CM | POA: Insufficient documentation

## 2024-08-06 LAB — BASIC METABOLIC PANEL WITH GFR
Anion gap: 10 (ref 5–15)
BUN: 39 mg/dL — ABNORMAL HIGH (ref 8–23)
CO2: 24 mmol/L (ref 22–32)
Calcium: 9.1 mg/dL (ref 8.9–10.3)
Chloride: 108 mmol/L (ref 98–111)
Creatinine, Ser: 2.13 mg/dL — ABNORMAL HIGH (ref 0.61–1.24)
GFR, Estimated: 34 mL/min — ABNORMAL LOW (ref >60)
Glucose, Bld: 94 mg/dL (ref 70–99)
Potassium: 5.5 mmol/L — ABNORMAL HIGH (ref 3.5–5.1)
Sodium: 142 mmol/L (ref 135–145)

## 2024-08-06 LAB — CBC
HCT: 48.5 % (ref 39.0–52.0)
Hemoglobin: 17 g/dL (ref 13.0–17.0)
MCH: 29.8 pg (ref 26.0–34.0)
MCHC: 35.1 g/dL (ref 30.0–36.0)
MCV: 85.1 fL (ref 80.0–100.0)
Platelets: 260 K/uL (ref 150–400)
RBC: 5.7 MIL/uL (ref 4.22–5.81)
RDW: 17.5 % — ABNORMAL HIGH (ref 11.5–15.5)
WBC: 8.5 K/uL (ref 4.0–10.5)
nRBC: 0 % (ref 0.0–0.2)

## 2024-08-06 LAB — BRAIN NATRIURETIC PEPTIDE: B Natriuretic Peptide: 242.2 pg/mL — ABNORMAL HIGH (ref 0.0–100.0)

## 2024-08-06 MED ORDER — DIGOXIN 125 MCG PO TABS: 0.1250 mg | ORAL_TABLET | Freq: Every day | ORAL | 3 refills | Status: AC

## 2024-08-06 MED ORDER — FUROSEMIDE 20 MG PO TABS: 20.0000 mg | ORAL_TABLET | ORAL | 3 refills | Status: DC | PRN

## 2024-08-06 MED ORDER — SACUBITRIL-VALSARTAN 24-26 MG PO TABS: 1.0000 | ORAL_TABLET | Freq: Two times a day (BID) | ORAL | 3 refills | Status: AC

## 2024-08-06 MED ORDER — SPIRONOLACTONE 25 MG PO TABS: 25.0000 mg | ORAL_TABLET | Freq: Every day | ORAL | 3 refills | Status: DC

## 2024-08-06 MED ORDER — FUROSEMIDE 20 MG PO TABS: 20.0000 mg | ORAL_TABLET | ORAL | 3 refills | Status: AC | PRN

## 2024-08-06 MED ORDER — SACUBITRIL-VALSARTAN 24-26 MG PO TABS: 1.0000 | ORAL_TABLET | Freq: Two times a day (BID) | ORAL | 3 refills | Status: DC

## 2024-08-06 MED ORDER — OMEPRAZOLE 20 MG PO CPDR: 20.0000 mg | DELAYED_RELEASE_CAPSULE | Freq: Every day | ORAL | 3 refills | Status: AC

## 2024-08-06 MED ORDER — APIXABAN 5 MG PO TABS: 5.0000 mg | ORAL_TABLET | Freq: Two times a day (BID) | ORAL | 3 refills | Status: AC

## 2024-08-06 MED ORDER — DIGOXIN 125 MCG PO TABS: 0.1250 mg | ORAL_TABLET | Freq: Every day | ORAL | 3 refills | Status: DC

## 2024-08-06 MED ORDER — OMEPRAZOLE 20 MG PO CPDR: 20.0000 mg | DELAYED_RELEASE_CAPSULE | Freq: Every day | ORAL | 3 refills | Status: DC

## 2024-08-06 MED ORDER — EMPAGLIFLOZIN 10 MG PO TABS: 10.0000 mg | ORAL_TABLET | Freq: Every day | ORAL | 3 refills | Status: DC

## 2024-08-06 MED ORDER — EMPAGLIFLOZIN 10 MG PO TABS: 10.0000 mg | ORAL_TABLET | Freq: Every day | ORAL | 3 refills | Status: AC

## 2024-08-06 MED ORDER — APIXABAN 5 MG PO TABS: 5.0000 mg | ORAL_TABLET | Freq: Two times a day (BID) | ORAL | 3 refills | Status: DC

## 2024-08-06 MED ORDER — SPIRONOLACTONE 25 MG PO TABS
25.0000 mg | ORAL_TABLET | Freq: Every day | ORAL | 3 refills | Status: DC
  Filled 2024-08-06: qty 90, 90d supply, fill #0

## 2024-08-06 NOTE — Patient Instructions (Signed)
 Start Spiro 25 mg daily - written Rx provided. Refills for all heart failure meds - 90 day written Rx provided.  Labs today - will call you if abnormal. Social worker met with patient today re: transportation and medication issues. Cardiac PET scan ordered - will call you to schedule when insurance authorization is obtained. See instructions below (also provided a written copy of same). Return to Heart Failure APP Clinic in 3 weeks - see below. Return to Heart Failure Pharmacy Clinic in 6 weeks - see below. Return to see Dr. Cherrie with echo in 8 - 10 weeks.  CALL [phone] LAST OF FEBRUARY TO SCHEDULE THIS APPOINTMENT. Please call us  at [phone] if any questions prior to your next appointment.     Cardiac Sarcoidosis/Inflammation PET Scan  Food Diary Name: _____________________________ Please fill in EXACTLY what you have eaten and when for 24 hours PRIOR to your test date.  Time Food/Drink Comments  Breakfast                Lunch                Dinner                Snacks                 DO NOT EXERCISE THE DAY BEFORE YOUR TEST DO NOT EAT AFTER 5 PM THE DAY BEFORE YOUR TEST.  ON THE DAY OF YOUR TEST, DO NOT EAT ANY FOOD AND ONLY DRINK CLEAR WATER! PLEASE BRING THIS FOOD DIARY WITH YOU TO YOUR APPOINTMENT   FDG Viability Cardiac PET Scan Instructions Your doctor has ordered a cardiac viability PET/CT for you. This test uses a small amount of a radioactive imaging agent to look at the heart muscle. This test is helpful to check the blood supply to your heart and how well the heart muscle takes up sugar (viability). For this test you will have pictures of your heart taken twice with different imaging agents. Where To Go: Your test is scheduled at [facility], [address] [place] [place], [place] [address] When you arrive come in the main hospital entrance. At the front desk let them know you have an appointment in Radiology. They will give you a green  card with instructions to get to Radiology. Check in at Radiology. After checking in, please be seated in the waiting room.  To Prepare for Your Test: No food or drink after midnight the night before your appointment. You may drink plain water (without additives or flavorings).  If you are anxious or claustrophobic, please speak with your referring physician about taking anxiety medications prior to your appointment. We are not able to prescribe or provide anxiety medication for this test.  Avoid heavy lifting or strenuous exercise or activity the day before your test. You may take your daily medications per usual - exceptions below: If you use long-acting insulin, please take half your usual dose the evening before the test (including but not limited to: Lantus, Toujeo, Basaglar, Tresiba, Levemir, Humalog N, Humulin N, Novolog N, or Novalin N).  If you are on a continuous glucose monitor, you may have to remove it for the test. Please plan accordingly.   On the Day of Your Cardiac Viability PET/CT test: Wear loose, comfortable clothing. Leave jewelry and valuables at home.  Do not take any insulin or oral medications for diabetes the morning of your test (including but not limited to metformin also know as  Glucophage). Bring a list of your medications with you and bring any medications that you may need to take throughout the day.  After checking in for your test, a technologist or nurse will explain the procedure and answer any questions you may have. An IV will be started in your arm and EKG patches will be placed on your chest. You will need to drink a sugary drink. At this point you will lay down on your back on the PET/CT scanner and we will connect the EKG patches to leads that are attached to the scanner. This allows us  to measure your heart's activity during the scan. You will be given a small amount of a radioactive imaging agent and we will take continuous pictures for 7 minutes.  After  imaging, you will be taken to a room with a recliner and your blood sugar will be checked. We will monitor your blood sugar several times during the test. If needed, you may be given insulin to help get your sugar levels within range for the second part of the test.  Once your blood sugar is in acceptable range, you will be given a second imaging agent. Once this imaging agent is administered through your IV, we will have you wait in the room for about 60 minutes. You can sleep, bring music to listen to/play on your phone, or watch TV while you wait. After the 60-minute wait we will bring you back to the PET/CT scanner and take a second set of images which will take approximately 10 minutes.  Please allow for 3 to 4 hours for the entire appointment.  After Your Cardiac Viability PET/CT: Drink plenty of fluids and eat a good meal within 2 hours of leaving the hospital. It is encouraged to urinate frequently for a couple of hours after your test.  Plan to eat a snack before bedtime. You will continue your diabetic medicines, including insulin, as prescribed after the test in finished. Your doctor will contact you with your test results in 7 to 10 business days.   For Questions About Your Test: Please call our Cardiac Imaging Nurse Navigators at [phone].  Please call to cancel or reschedule, [phone] For more information and frequently asked questions, please visit our website : [email or web address]

## 2024-08-06 NOTE — Progress Notes (Signed)
 Heart and Vascular Care Navigation  08/06/2024  JHETT FRETWELL [birth year] [id number]  Reason for Referral: SDOH concerns   Engaged with patient face to face for initial visit for Heart and Vascular Care Coordination.                                                                                                   Assessment:  CSW met with pt to discuss current SDOH concerns.  Pt lives in home that he reportedly rents and has two roommates who sublet from him.  States that sometimes it is hard to pay for electric bill and that he hasn't had it at $0 in a very long time- no concerns with shut off notice at this time.   Provided pt with list of [employer] resources as well as application for [employer] program to potentially [facility].  Pt utilizes [facility] for healthcare and meds and has no other form of coverage.  Pt only makes about $970/month and reports no assets so encouraged to apply for Medicaid- provided info for DHHS workers at [facility] and encouraged to apply today so that he could have coverage to more easily see community providers and have transportation benefits through Medicaid.  Pt relies on his neighbors for transportation at this time.  States they are helpful to get to appts and is not concerned about transportation in the future though would still like community resource list-provided.  Also utilizes the bus system to get around when needed.  CSW provided with [employer] info to help apply for food stamps and list of local pantries.                                    Social History:                                                                             SDOH Screenings   Food Insecurity: Food Insecurity Present (08/06/2024)  Housing: High Risk (07/19/2024)  Transportation Needs: Unmet Transportation Needs (08/06/2024)  Utilities: At Risk (08/06/2024)  Depression (PHQ2-9): Low Risk  (01/08/2019)  Financial Resource  Strain: High Risk (08/06/2024)  Tobacco Use: Medium Risk (08/06/2024)    SDOH Interventions: Financial Resources:  Surveyor, Quantity Strain Interventions: Programmer, Applications Provided [employer] Insecurity:  Food Insecurity Interventions: [employer] Provided, Assist with [employer] Insecurity:   None reported  Transportation:   Transportation Interventions: [employer] takes him most places     Follow-up plan:    CSW will follow up with pt by phone to check in regarding progress with resources.  Andriette HILARIO Leech, LCSW Clinical Social Worker [facility] Desk#: [phone]  Cell#: [phone]

## 2024-08-09 ENCOUNTER — Ambulatory Visit (HOSPITAL_COMMUNITY)

## 2024-08-20 ENCOUNTER — Telehealth (HOSPITAL_COMMUNITY): Payer: Self-pay | Admitting: Licensed Clinical Social Worker

## 2024-08-20 NOTE — Telephone Encounter (Signed)
 CSW called pt to check in regarding SDOH concerns- unable to reach- left VM requesting return call  Andriette HILARIO Leech, LCSW Clinical Social Worker [facility] Desk#: [phone] Cell#: [phone]

## 2024-08-27 ENCOUNTER — Telehealth (HOSPITAL_COMMUNITY): Payer: Self-pay

## 2024-08-27 NOTE — Telephone Encounter (Signed)
 Called to confirm/remind patient of their appointment at the [facility] on 08/27/24.   Appointment:   [x] Confirmed  [] Left mess   [] No answer/No voice mail  [] VM Full/unable to leave message  [] Phone not in service  Patient reminded to bring all medications and/or complete list.  Confirmed patient has transportation. Gave directions, instructed to utilize valet parking.

## 2024-08-28 ENCOUNTER — Telehealth (HOSPITAL_COMMUNITY): Payer: Self-pay

## 2024-08-28 NOTE — Telephone Encounter (Signed)
 Called to confirm/remind patient of their appointment at the [facility] on 08/29/24.   Appointment:   [] Confirmed  [x] Left mess   [] No answer/No voice mail  [] VM Full/unable to leave message  [] Phone not in service  And  to bring in all medications and/or complete list.

## 2024-08-29 ENCOUNTER — Ambulatory Visit (HOSPITAL_COMMUNITY)
Admission: RE | Admit: 2024-08-29 | Discharge: 2024-08-29 | Disposition: A | Discharge status: Home / Self Care | Source: Ambulatory Visit | Attending: Psychiatry | Admitting: Psychiatry

## 2024-08-29 ENCOUNTER — Encounter (HOSPITAL_COMMUNITY): Payer: Self-pay

## 2024-08-29 ENCOUNTER — Ambulatory Visit (HOSPITAL_COMMUNITY): Payer: Self-pay | Admitting: Adult Health

## 2024-08-29 VITALS — BP 130/68 | HR 50 | Ht 73.0 in | Wt 161.6 lb

## 2024-08-29 DIAGNOSIS — I5082 Biventricular heart failure: Secondary | ICD-10-CM | POA: Insufficient documentation

## 2024-08-29 DIAGNOSIS — E8770 Fluid overload, unspecified: Secondary | ICD-10-CM | POA: Insufficient documentation

## 2024-08-29 DIAGNOSIS — Z87891 Personal history of nicotine dependence: Secondary | ICD-10-CM | POA: Diagnosis not present

## 2024-08-29 DIAGNOSIS — I110 Hypertensive heart disease with heart failure: Secondary | ICD-10-CM | POA: Insufficient documentation

## 2024-08-29 DIAGNOSIS — N1831 Chronic kidney disease, stage 3a: Secondary | ICD-10-CM | POA: Diagnosis not present

## 2024-08-29 DIAGNOSIS — Z7901 Long term (current) use of anticoagulants: Secondary | ICD-10-CM | POA: Diagnosis not present

## 2024-08-29 DIAGNOSIS — Z86718 Personal history of other venous thrombosis and embolism: Secondary | ICD-10-CM | POA: Diagnosis not present

## 2024-08-29 DIAGNOSIS — Z79899 Other long term (current) drug therapy: Secondary | ICD-10-CM | POA: Insufficient documentation

## 2024-08-29 DIAGNOSIS — J449 Chronic obstructive pulmonary disease, unspecified: Secondary | ICD-10-CM | POA: Diagnosis not present

## 2024-08-29 DIAGNOSIS — Z86711 Personal history of pulmonary embolism: Secondary | ICD-10-CM | POA: Insufficient documentation

## 2024-08-29 DIAGNOSIS — I428 Other cardiomyopathies: Secondary | ICD-10-CM | POA: Insufficient documentation

## 2024-08-29 DIAGNOSIS — I4892 Unspecified atrial flutter: Secondary | ICD-10-CM | POA: Insufficient documentation

## 2024-08-29 DIAGNOSIS — I272 Pulmonary hypertension, unspecified: Secondary | ICD-10-CM | POA: Diagnosis not present

## 2024-08-29 DIAGNOSIS — E875 Hyperkalemia: Secondary | ICD-10-CM

## 2024-08-29 DIAGNOSIS — Z7984 Long term (current) use of oral hypoglycemic drugs: Secondary | ICD-10-CM | POA: Diagnosis not present

## 2024-08-29 DIAGNOSIS — I10 Essential (primary) hypertension: Secondary | ICD-10-CM

## 2024-08-29 DIAGNOSIS — I5022 Chronic systolic (congestive) heart failure: Secondary | ICD-10-CM | POA: Diagnosis present

## 2024-08-29 LAB — BASIC METABOLIC PANEL WITH GFR
Anion gap: 7 (ref 5–15)
BUN: 17 mg/dL (ref 8–23)
CO2: 26 mmol/L (ref 22–32)
Calcium: 8.7 mg/dL — ABNORMAL LOW (ref 8.9–10.3)
Chloride: 105 mmol/L (ref 98–111)
Creatinine, Ser: 1.52 mg/dL — ABNORMAL HIGH (ref 0.61–1.24)
GFR, Estimated: 51 mL/min — ABNORMAL LOW
Glucose, Bld: 139 mg/dL — ABNORMAL HIGH (ref 70–99)
Potassium: 4.5 mmol/L (ref 3.5–5.1)
Sodium: 138 mmol/L (ref 135–145)

## 2024-08-29 NOTE — Progress Notes (Signed)
"   ReDS Vest / Clip - 08/29/24 1112       ReDS Vest / Clip   Station Marker C    Ruler Value 29    ReDS Value Range Moderate volume overload    ReDS Actual Value 40          "

## 2024-08-29 NOTE — Progress Notes (Addendum)
 "  ADVANCED HF CLINIC CONSULT NOTE  Referring Physician: [facility] Primary Care: [facility] Primary Cardiologist: Jerel Balding, MD  Chief Complaint: Heart Failure  HPI: Cody Joyce is a 63 y.o.  male with chronic HFrEF, NicM, HTN, CKD 3a, hx submassive PE/DVT. A flutter, COPD alcohol use disorder and former smoker.   Admitted 11/25 with A/C HFrEF>BiV HF. Previously followed by Dr. Balding but ended up transferring care to cardiology through the [facility]. D/t financial issues he was taking his medicines every other day, if that. During this admission he reported being a heavy smoker prior to submassive PE, then he stopped for a while but over the last year he started smoking again about 2 black and mild's a day. Had been drinking for several years now. Buys a bottle of tequila every Friday and finishes it by Sunday then during the week he drinks 1-2 beers/day. Intermittent marijuana use. RHC showed RA 13, PA 52/29 (39), PCW 26, Fick CO/CI 3.5/1.8, TD CO/CI 3.5/1.8, PAPi 1.8, PaSat 51%. He was started on IV milrinone . cMRI: LVEF 19%, mod LV dilatation. Inferior wall is akinetic with LGE and possible mural thrombus. LGE in coronary distrubution.  Findings worrisome for CAD vs sarcoid vs myocarditis. RVEF 27%. AHF team consulted. LHC with no CAD. He diuresed well with IV lasix  and tolerated milrinone  wean very well. GDMT started.   08/06/24 K 5.5 . He was instructed to take lokelma but he says he did not pick it up.   Today he returns for HF follow up.Overall feeling fine.  Able to walk a block easily. Occasionally dizzy when bends over and stands up.  SBP 120-130 at home. Denies SOB/PND/Orthopnea. No bleeding issues. No chest pain. Appetite ok. No fever or chills. Weight at home has been stable.  Taking all medications. No longer smoking.    Reports father with history of MI/CAD, no other significant family history.   Cardiac studies:  [facility] 4/21: normal coronaries.  Hemodynamics relatively stable just mild pulmonary hypertension.  Echo 11/25: EF <20%, LV apical slow very sluggish, LV with GHK, GIIIDD, RV mildly reduced, mildly elevated PASP, LA severely dilated, RA mildly dilated, mild MR  RHC 11/25: RA 13, PA 52/29 (39), PCW 26, Fick CO/CI 3.5/1.8, TD CO/CI 3.5/1.8, PAPi 1.8, PaSat 51%  cMRI 11/25: LVEF 19%, mod LV dilatation. Inferior wall is akinetic with LGE and possible mural thrombus. LGE in coronary distrubution. Findings worrisome for CAD vs sarcoid vs myocarditis. RVEF 27%.  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    PFO (patent foramen ovale) 01/03/2020   Pulmonary embolism (HCC) 12/29/2019   Renal infarct     Current Outpatient Medications  Medication Sig Dispense  Refill   acetaminophen  (TYLENOL ) 325 MG tablet Take 650 mg by mouth every 6 (six) hours as needed for moderate pain (pain score 4-6).     apixaban  (ELIQUIS ) 5 MG TABS tablet Take 1 tablet (5 mg total) by mouth 2 (two) times daily. 180 tablet 3   Cholecalciferol 50 MCG (2000 UT) TABS Take 50 mcg by mouth.     diclofenac Sodium (VOLTAREN) 1 % GEL Apply 4 g  topically daily as needed (arthritic joint pain).     dicyclomine (BENTYL) 20 MG tablet Take 20 mg by mouth 3 (three) times daily before meals.     digoxin  (LANOXIN ) 0.125 MG tablet Take 1 tablet (0.125 mg total) by mouth daily. 90 tablet 3   empagliflozin  (JARDIANCE ) 10 MG TABS tablet Take 1 tablet (10 mg total) by mouth daily. 90 tablet 3   furosemide  (LASIX ) 20 MG tablet Take 1 tablet (20 mg total) by mouth as needed for edema or fluid. 30 tablet 3   methocarbamol (ROBAXIN) 750 MG tablet Take 750 mg by mouth 2 (two) times daily as needed for muscle spasms.     omeprazole  (PRILOSEC) 20 MG capsule Take 1 capsule (20 mg total) by mouth daily. 90 capsule 3   sacubitril -valsartan  (ENTRESTO ) 24-26 MG Take 1 tablet by mouth 2 (two) times daily. 180 tablet 3   sertraline  (ZOLOFT ) 25 MG tablet Take 1 tablet (25 mg total) by mouth at bedtime. 30 tablet 0   No current facility-administered medications for this encounter.    No Known Allergies    Social History   Socioeconomic History   Marital status: Married    Spouse name: Not on file   Number of children: 0   Years of education: Not on file   Highest education level: Not on file  Occupational History   Occupation: self employed  Tobacco Use   Smoking status: Former    Current packs/day: 0.00    Types: Cigarettes    Quit date: 01/05/2006    Years since quitting: 18.6   Smokeless tobacco: Never  Vaping Use   Vaping status: Never Used  Substance and Sexual Activity   Alcohol use: Yes    Alcohol/week: 6.0 standard drinks of alcohol    Types: 2 Glasses of wine, 4 Cans of beer per week    Comment: 0-2 per day   Drug use: No   Sexual activity: Yes  Other Topics Concern   Not on file  Social History Narrative   Marital status:  Married x 3 years; not happily married.      Children:  None      Lives: alone.      Employment:  Medical Sales Representative Custodian work x 7 years; happy.      Tobacco: smoke cigars 2 per day.  Quit cigarettes in 2001;  smoked x 15 years.      Alcohol:  Weekends 4 beers per week; 2 drinks per week.      Drugs:  None      Exercising:  Three days per week; active job.        Seatbelt:  100%      Guns:  None      Sexual activity: condoms; Gonorrhea in high school.  One partner in past year.  Last STD screening 2013.   Social Drivers of Health   Tobacco Use: Medium Risk (08/29/2024)   Patient History    Smoking Tobacco Use: Former    Smokeless Tobacco Use: Never    Passive  Exposure: Not on file  Financial Resource Strain: High Risk (08/06/2024)   Overall Financial Resource Strain (CARDIA)    Difficulty of Paying Living Expenses: Hard  Food Insecurity: Food Insecurity Present (08/06/2024)   Epic    Worried About Programme Researcher, Broadcasting/film/video in the Last Year: Sometimes true    [employer] of Food in the Last Year: Sometimes true  Transportation Needs: Unmet Transportation Needs (08/06/2024)   Epic    Lack of Transportation (Medical): Yes    Lack of Transportation (Non-Medical): No  Physical Activity: Not on file  Stress: Not on file  Social Connections: Not on file  Intimate Partner Violence: Not At Risk (07/19/2024)   Epic    Fear of Current or Ex-Partner: No    Emotionally Abused: No    Physically Abused: No    Sexually Abused: No  Depression (PHQ2-9): Not on file  Alcohol Screen: Not on file  Housing: High Risk (07/19/2024)   Epic    Unable to Pay for Housing in the Last Year: Yes    Number of Times Moved in the Last Year: 0    Homeless in the Last Year: No  Utilities: At Risk (08/06/2024)   Epic    Threatened with loss of utilities: Yes  Health Literacy: Not on file      Family History  Problem Relation Age of Onset   Glaucoma Mother    Heart disease Father        stents/CAD/AMI age 64s.   Glaucoma Father    Pancreatic cancer Sister    Ovarian cancer Cousin        maternal   Colon cancer Neg Hx    Colitis Neg Hx    Esophageal cancer Neg Hx    Rectal cancer Neg Hx    Stomach cancer Neg Hx      Vitals:   08/29/24 1112  BP: 130/68  Pulse: (!) 50  SpO2: 96%  Weight: 73.3 kg (161 lb 9.6 oz)  Height: 6' 1 (1.854 m)   Wt Readings from Last 3 Encounters:  08/29/24 73.3 kg (161 lb 9.6 oz)  08/06/24 72.5 kg (159 lb 12.8 oz)  07/27/24 68.3 kg (150 lb 9.2 oz)    PHYSICAL EXAM: General:   No resp difficulty Neck: JVP 9-10 Cor: Regular rate & rhythm.  Lungs: clear Abdomen: soft, nontender, nondistended.  Extremities: no  edema Neuro: alert & oriented x3 ReDs reading: 40 %, abnormal    Wt Readings from Last 3 Encounters:  08/29/24 73.3 kg (161 lb 9.6 oz)  08/06/24 72.5 kg (159 lb 12.8 oz)  07/27/24 68.3 kg (150 lb 9.2 oz)    ASSESSMENT & PLAN: Chronic Biventricular HF  - Echo 4/21: EF 35-40%, GHK, mod reduced RV - [facility] 4/21: normal coronaries. Hemodynamics relatively stable just mild pulmonary hypertension.  - Suspect recent exacerbation 2/2 ETOH abuse vs medication noncompliance. ? Coronary diseases vs Sarcoid - Echo 11/25 EF <20%, LV apical slow very sluggish, LV with GHK, GIIIDD, RV mildly reduced, mildly elevated PASP, LA severely dilated, RA mildly dilated, mild MR.  - RHC 11/17 RA 13, PA 52/29 (39), PCW 26, Fick CO/CI 3.5/1.8, TD CO/CI 3.5/1.8, PAPi 1.8, PaSat 51%.  - cMRI showed LVEF 19%, mod LV dilatation. Inferior wall is akinetic with LGE and possible mural thrombus. LGE in coronary distrubution.  Findings worrisome for CAD vs sarcoid vs myocarditis. RVEF 27%.  - LHC 07/24/24: no CAD - NYHA II. Reds CLip 40%. Mildly volume overloaded. Instructed to  take lasix  today.   -Will not start bb due to bradycardia.  - Continue Entresto  24-26 mg BID. -for now will need to stop spiro with elevated potassium. Check BMET today.  - Continue digoxin  0.125 mg daily. Digoxin  <0.6 07/27/24.  - Continue Jardiance  10 mg daily -- Cardiac PET to r/o sarcoid vs previous myocarditis.  - Once LV function improves will need further w/u for CTEPH with repeat RHC and pulmonary angios   - Not a candidate for heart tx at this time . Last smoked 11/1/22025  recent tobacco and ETOH use. Motivated to quit, had not smoked or drank anything since admission. Need to consider advanced therapies (LVAD).   Atrial flutter -Reg on exam.  -  Continue Eliquis  5 mg BID.    Possible mural thrombus - LV apical flow very sluggish on echo 11/25 - cMRI also concerning for possible LV mural thrombus - Continue Eliquis  5 mg BID. Denies abnormal bleeding. CBC today.    Mod mixed pulmonary HTN - RHC pressures elevated as above on RHC this admission - VQ scan with large segmental diffusion defect in RML and medium sized segmental perfusion defect in lateral basal LLL. Nonspecific but may reflect age indeterminate pulmonary emboli vs obstructive pulmonary disease.  - Eliquis  as above   HTN - BP stable.  - Continue Entresto .    CKD 3a - Baseline SCr ~1.3? -Check BMET today    Hx PE/DVT - 4/21: required IR bilateral thrombectomy complicated by embolic L renal infarct secondary to PFO  - Continue Eliquis    NSVT - during 11/25 admission -  Stable off amiodarone  (stopped at discharge)  Hyperkalmia Recent potassium 5.5. Repeat BMET today.  Stop spiro.   Follow up next month with pharmacy and in 2 months with Echo.    Nataline Basara NP-C  [facility] 08/29/2024  "

## 2024-08-29 NOTE — Patient Instructions (Signed)
 Medication Changes:  STOP Spironolactone   Take Furosemide  20 mg daily TODAY, then take as needed  Lab Work:  Labs done today, your results will be available in MyChart, we will contact you for abnormal readings.  Special Instructions // Education:  Do the following things EVERYDAY: Weigh yourself in the morning before breakfast. Write it down and keep it in a log. Take your medicines as prescribed Eat low salt foods--Limit salt (sodium) to 2000 mg per day.  Stay as active as you can everyday Limit all fluids for the day to less than 2 liters   Follow-Up in: AS SCHEDULED   At the [facility], you and your health needs are our priority. We have a designated team specialized in the treatment of Heart Failure. This Care Team includes your primary Heart Failure Specialized Cardiologist (physician), Advanced Practice Providers (APPs- Physician Assistants and Nurse Practitioners), and Pharmacist who all work together to provide you with the care you need, when you need it.   You may see any of the following providers on your designated Care Team at your next follow up:  Dr. Toribio Fuel Dr. Ezra Shuck Dr. Odis Brownie Greig Mosses, NP Caffie Shed, [place] [facility] [place], [place] Beckey Coe, NP Jordan Lee, NP Tinnie Redman, PharmD   Please be sure to bring in all your medications bottles to every appointment.   Need to Contact Us :  If you have any questions or concerns before your next appointment please send us  a message through [facility] or call our office at [phone].    TO LEAVE A MESSAGE FOR THE NURSE SELECT OPTION 2, PLEASE LEAVE A MESSAGE INCLUDING: YOUR NAME DATE OF BIRTH CALL BACK NUMBER REASON FOR CALL**this is important as we prioritize the call backs  YOU WILL RECEIVE A CALL BACK THE SAME DAY AS LONG AS YOU CALL BEFORE 4:00 PM

## 2024-08-29 NOTE — Progress Notes (Signed)
 Medication Samples have been provided to the patient.  Drug name: Lokelma       Strength: 10gram     Qty: 1 packet   LOT: [id number]   Exp.Date: 03/2026  Dosing instructions: ONLY TAKE AS DIRECTED BY HEART FAILURE CLINIC  The patient has been instructed regarding the correct time, dose, and frequency of taking this medication, including desired effects and most common side effects.   Cody Joyce Cody Joyce 12:00 PM 08/29/2024

## 2024-09-12 ENCOUNTER — Telehealth: Payer: Self-pay | Admitting: *Deleted

## 2024-09-12 NOTE — Telephone Encounter (Signed)
"   Attempt to call and arrange colonoscopy since due. Mail box full unable to leave message. "

## 2024-09-17 NOTE — Progress Notes (Incomplete)
 ***In Progress***    Advanced Heart Failure Clinic Note   Referring Physician: [facility] Primary Care: [facility] Primary Cardiologist: Jerel Balding, MD  HPI:  Cody Joyce is a 64 y.o.  male with chronic HFrEF, NicM, HTN, CKD 3a, hx submassive PE/DVT. A flutter, COPD alcohol use disorder and former smoker.    Admitted 11/25 with A/C HFrEF>BiV HF. Previously followed by Dr. Balding but ended up transferring care to cardiology through the [facility]. D/t financial issues he was taking his medicines every other day, if that. During this admission he reported being a heavy smoker prior to submassive PE, then he stopped for a while but over the last year he started smoking again about 2 black and mild's a day. Had been drinking for several years now. Buys a bottle of tequila every Friday and finishes it by Sunday then during the week he drinks 1-2 beers/day. Intermittent marijuana use. RHC showed RA 13, PA 52/29 (39), PCW 26, Fick CO/CI 3.5/1.8, TD CO/CI 3.5/1.8, PAPi 1.8, PaSat 51%. He was started on IV milrinone . cMRI: LVEF 19%, mod LV dilatation. Inferior wall is akinetic with LGE and possible mural thrombus. LGE in coronary distrubution.  Findings worrisome for CAD vs sarcoid vs myocarditis. RVEF 27%. AHF team consulted. LHC with no CAD. He diuresed well with IV lasix  and tolerated milrinone  wean very well. GDMT started.    08/06/24 K 5.5 . He was instructed to take lokelma but he says he did not pick it up.    Today he returns for HF follow up.Overall feeling fine.  Able to walk a block easily. Occasionally dizzy when bends over and stands up.  SBP 120-130 at home. Denies SOB/PND/Orthopnea. No bleeding issues. No chest pain. Appetite ok. No fever or chills. Weight at home has been stable.  Taking all medications. No longer smoking.   Today he returns to HF clinic for pharmacist medication titration. At last visit with MD ***.   Shortness of breath/dyspnea on exertion?  {YES NO:22349}  Orthopnea/PND? {YES NO:22349} Edema? {YES NO:22349} Lightheadedness/dizziness? {YES NO:22349} Daily weights at home? {YES NO:22349} Blood pressure/heart rate monitoring at home? {YES [id number] Following low-sodium/fluid-restricted diet? {YES NO:22349}  HF Medications:   Has the patient been experiencing any side effects to the medications prescribed?  {YES NO:22349}  Does the patient have any problems obtaining medications due to transportation or finances?   {YES NO:22349}  Understanding of regimen: {excellent/good/fair/poor:19665} Understanding of indications: {excellent/good/fair/poor:19665} Potential of compliance: {excellent/good/fair/poor:19665} Patient understands to avoid NSAIDs. Patient understands to avoid decongestants.    Pertinent Lab Values: Serum creatinine ***, BUN ***, Potassium ***, Sodium ***, BNP ***, Magnesium  ***, Digoxin  ***   Vital Signs: Weight: *** (last clinic weight: ***) Blood pressure: ***  Heart rate: ***   Cardiac studies:  [facility] 4/21: normal coronaries. Hemodynamics relatively stable just mild pulmonary hypertension.  Echo 11/25: EF <20%, LV apical slow very sluggish, LV with GHK, GIIIDD, RV mildly reduced, mildly elevated PASP, LA severely dilated, RA mildly dilated, mild MR  RHC 11/25: RA 13, PA 52/29 (39), PCW 26, Fick CO/CI 3.5/1.8, TD CO/CI 3.5/1.8, PAPi 1.8, PaSat 51%  cMRI 11/25: LVEF 19%, mod LV dilatation. Inferior wall is akinetic with LGE and possible mural thrombus. LGE in coronary distrubution. Findings worrisome for CAD vs sarcoid vs myocarditis. RVEF 27%.  LHC 11/25: no CAD  Assessment/Plan: Chronic Biventricular HF  - Echo 4/21: EF 35-40%, GHK, mod reduced RV - [facility] 4/21: normal coronaries. Hemodynamics relatively stable just mild  pulmonary hypertension.  - Suspect recent exacerbation 2/2 ETOH abuse vs medication noncompliance. ? Coronary diseases vs Sarcoid - Echo 11/25 EF <20%, LV apical slow very sluggish,  LV with GHK, GIIIDD, RV mildly reduced, mildly elevated PASP, LA severely dilated, RA mildly dilated, mild MR.  - RHC 11/17 RA 13, PA 52/29 (39), PCW 26, Fick CO/CI 3.5/1.8, TD CO/CI 3.5/1.8, PAPi 1.8, PaSat 51%.  - cMRI showed LVEF 19%, mod LV dilatation. Inferior wall is akinetic with LGE and possible mural thrombus. LGE in coronary distrubution.  Findings worrisome for CAD vs sarcoid vs myocarditis. RVEF 27%.  - LHC 07/24/24: no CAD - NYHA II. Reds CLip 40%. Mildly volume overloaded. Instructed to take lasix  today.   -Will not start bb due to bradycardia.  - Continue Entresto  24-26 mg BID. -for now will need to stop spiro with elevated potassium. Check BMET today.  - Continue digoxin  0.125 mg daily. Digoxin  <0.6 07/27/24.  - Continue Jardiance  10 mg daily -- Cardiac PET to r/o sarcoid vs previous myocarditis.  - Once LV function improves will need further w/u for CTEPH with repeat RHC and pulmonary angios  - Not a candidate for heart tx at this time . Last smoked 11/1/22025  recent tobacco and ETOH use. Motivated to quit, had not smoked or drank anything since admission. Need to consider advanced therapies (LVAD).    Atrial flutter -Reg on exam.  -  Continue Eliquis  5 mg BID.    Possible mural thrombus - LV apical flow very sluggish on echo 11/25 - cMRI also concerning for possible LV mural thrombus - Continue Eliquis  5 mg BID. Denies abnormal bleeding. CBC today.    Mod mixed pulmonary HTN - RHC pressures elevated as above on RHC this admission - VQ scan with large segmental diffusion defect in RML and medium sized segmental perfusion defect in lateral basal LLL. Nonspecific but may reflect age indeterminate pulmonary emboli vs obstructive pulmonary disease.  - Eliquis  as above   HTN - BP stable.  - Continue Entresto .    CKD 3a - Baseline SCr ~1.3? -Check BMET today    Hx PE/DVT - 4/21: required IR bilateral thrombectomy complicated by embolic L renal infarct secondary to PFO   - Continue Eliquis    NSVT - during 11/25 admission -  Stable off amiodarone  (stopped at discharge)   Hyperkalmia Recent potassium 5.5. Repeat BMET today.  Stop spiro.   Follow up ***   Tinnie Redman, PharmD, BCPS, BCCP, CPP Heart Failure Clinic Pharmacist [phone]

## 2024-09-27 ENCOUNTER — Telehealth (HOSPITAL_COMMUNITY): Payer: Self-pay | Admitting: Internal Medicine

## 2024-10-01 ENCOUNTER — Ambulatory Visit (HOSPITAL_COMMUNITY)

## 2024-10-10 NOTE — Progress Notes (Incomplete)
 ***In Progress***    Advanced Heart Failure Clinic Note   Referring Physician: [facility] Primary Care: [facility] Primary Cardiologist: Jerel Balding, MD HF Cardiologist: Dr. Cherrie  HPI:  Cody Joyce is a 64 y.o.  male with chronic HFrEF, NicM, HTN, CKD 3a, hx submassive PE/DVT. A flutter, COPD alcohol use disorder and former smoker.    Admitted 11/25 with A/C HFrEF>BiV HF. Previously followed by Dr. Balding but ended up transferring care to cardiology through the [facility]. D/t financial issues he was taking his medicines every other day, if that. During this admission he reported being a heavy smoker prior to submassive PE, then he stopped for a while but over the last year he started smoking again about 2 black and mild's a day. Had been drinking for several years now. Buys a bottle of tequila every Friday and finishes it by Sunday then during the week he drinks 1-2 beers/day. Intermittent marijuana use. RHC showed RA 13, PA 52/29 (39), PCW 26, Fick CO/CI 3.5/1.8, TD CO/CI 3.5/1.8, PAPi 1.8, PaSat 51%. He was started on IV milrinone . cMRI: LVEF 19%, mod LV dilatation. Inferior wall is akinetic with LGE and possible mural thrombus. LGE in coronary distrubution.  Findings worrisome for CAD vs sarcoid vs myocarditis. RVEF 27%. AHF team consulted. LHC with no CAD. He diuresed well with IV lasix  and tolerated milrinone  wean very well. GDMT started.    08/06/24 K 5.5 . He was instructed to take lokelma but he says he did not pick it up.    He returned for HF follow up with NP on 08/29/24. Overall felt fine.  Patient was able to walk a block easily, but was occasionally dizzy when he bent over and stood up.  SBP 120-130 at home. Denied SOB/PND/Orthopnea. No bleeding issues. No chest pain. Appetite was ok. No fever or chills. Weight at home had been stable.  Reported taking all medications. Patient was no longer smoking.   Today he returns to HF clinic for pharmacist  medication titration. At last visit with NP,  ***.   Shortness of breath/dyspnea on exertion? {YES NO:22349}  Orthopnea/PND? {YES NO:22349} Edema? {YES NO:22349} Lightheadedness/dizziness? {YES NO:22349} Daily weights at home? {YES NO:22349} Blood pressure/heart rate monitoring at home? {YES [id number] Following low-sodium/fluid-restricted diet? {YES NO:22349}  HF Medications: Entresto  24/26 mg BID Jardiance  10 mg daily Digoxin  0.125 mg daily Furosemide  20 mg PRN  Has the patient been experiencing any side effects to the medications prescribed?  {YES NO:22349}  Does the patient have any problems obtaining medications due to transportation or finances?   No patient has [employer]  Understanding of regimen: {excellent/good/fair/poor:19665} Understanding of indications: {excellent/good/fair/poor:19665} Potential of compliance: {excellent/good/fair/poor:19665} Patient understands to avoid NSAIDs. Patient understands to avoid decongestants.    Pertinent Lab Values: (08/29/24) Serum creatinine 1.52 mg/dL, BUN 17 mg/dL, Potassium 4.5 mmol/L, Sodium 138 mmol/L, BNP 242.2 pg/mL (08/06/24), Magnesium  2.0 mg/dL (88/78/74), Digoxin  <0.6 ng/mL (07/27/24)   Vital Signs: Weight: *** (last clinic weight: 161 lbs) Blood pressure: *** (last clinic BP: 130/68 mmHg) Heart rate: *** (last clinic HR: 50 bpm ***)  Cardiac studies:  [facility] 12/2019: normal coronaries. Hemodynamics relatively stable just mild pulmonary hypertension.  Echo 07/2024: EF <20%, LV apical slow very sluggish, LV with GHK, GIIIDD, RV mildly reduced, mildly elevated PASP, LA severely dilated, RA mildly dilated, mild MR  RHC 07/2024: RA 13, PA 52/29 (39), PCW 26, Fick CO/CI 3.5/1.8, TD CO/CI 3.5/1.8, PAPi 1.8, PaSat 51%  cMRI 07/2024: LVEF 19%,  mod LV dilatation. Inferior wall is akinetic with LGE and possible mural thrombus. LGE in coronary distrubution. Findings worrisome for CAD vs sarcoid vs myocarditis. RVEF 27%.  LHC  07/2024: no CAD  Assessment/Plan: Chronic Biventricular HF  - NYHA II. Reds Clip 40%***. Mildly volume overloaded. Instructed to take lasix  today***.   - Avoiding beta-blockers with history of bradycardia *** - Continue Entresto  24/26 mg BID *** - spironolactone  25 mg discontinued with hyperkalemia, also noted to have an AKI at that time *** - Continue Jardiance  10 mg daily *** - Continue digoxin  0.125 mg daily. Digoxin  level <0.6 ng/mL on 07/27/24,  - Cardiac PET to r/o sarcoid vs previous myocarditis.  - Once LV function improves will need further w/u for CTEPH with repeat RHC and pulmonary angios  - Not a candidate for heart transplant at this time  with recent tobacco and EtOH use. Last smoked 11/1/22025 *** Motivated to quit, had not smoked or drank anything since admission. Need to consider advanced therapies (LVAD).    Atrial flutter - Continue Eliquis  5 mg BID. ***   Possible mural thrombus - LV apical flow very sluggish on echo 07/2024 - cMRI also concerning for possible LV mural thrombus - Continue Eliquis  5 mg BID. Denies abnormal bleeding. ***   Mod mixed pulmonary HTN - RHC pressures elevated as above on RHC this admission - VQ scan with large segmental diffusion defect in RML and medium sized segmental perfusion defect in lateral basal LLL. Nonspecific but may reflect age indeterminate pulmonary emboli vs obstructive pulmonary disease.  - Eliquis  as above   HTN - BP stable.  - Continue Entresto .    CKD 3a - SCr 1.52 mg/dL on 87/75/74, trended back down to baseline - Continue Jardiance  10 mg daily   Hx PE/DVT - 12/2019: required IR bilateral thrombectomy complicated by embolic L renal infarct secondary to PFO  - Continue Eliquis    NSVT - Noted during 07/2024 admission - Stable off amiodarone  (stopped at discharge)  Follow up in 2 weeks with Dr. Cherrie Morna Breach, PharmD, BCPS PGY2 Cardiology Pharmacy Resident  Tinnie Redman, PharmD, BCPS, [facility],  CPP Heart Failure Clinic Pharmacist [phone]

## 2024-10-11 ENCOUNTER — Ambulatory Visit (HOSPITAL_COMMUNITY): Admission: RE | Admit: 2024-10-11 | Source: Ambulatory Visit

## 2024-10-23 ENCOUNTER — Other Ambulatory Visit (HOSPITAL_COMMUNITY)

## 2024-10-23 ENCOUNTER — Ambulatory Visit (HOSPITAL_COMMUNITY): Admitting: Internal Medicine

## 2024-10-24 ENCOUNTER — Ambulatory Visit (HOSPITAL_COMMUNITY): Admitting: Internal Medicine

## 2024-10-24 ENCOUNTER — Ambulatory Visit (HOSPITAL_COMMUNITY)
